# Patient Record
Sex: Male | Born: 1960 | Race: White | Hispanic: No | Smoking: Current every day smoker
Health system: Southern US, Community
[De-identification: ages and names within clinical notes are randomized; demographics above are authoritative.]

## PROBLEM LIST (undated history)

## (undated) DIAGNOSIS — G919 Hydrocephalus, unspecified: Secondary | ICD-10-CM

## (undated) DIAGNOSIS — F419 Anxiety disorder, unspecified: Secondary | ICD-10-CM

## (undated) DIAGNOSIS — J449 Chronic obstructive pulmonary disease, unspecified: Secondary | ICD-10-CM

## (undated) DIAGNOSIS — F32A Depression, unspecified: Secondary | ICD-10-CM

## (undated) DIAGNOSIS — F329 Major depressive disorder, single episode, unspecified: Secondary | ICD-10-CM

## (undated) HISTORY — DX: Anxiety disorder, unspecified: F41.9

## (undated) HISTORY — DX: Depression, unspecified: F32.A

## (undated) HISTORY — DX: Major depressive disorder, single episode, unspecified: F32.9

## (undated) HISTORY — PX: OTHER SURGICAL HISTORY: SHX169

---

## 2001-05-18 HISTORY — PX: BRAIN SURGERY: SHX531

## 2011-11-12 ENCOUNTER — Emergency Department: Payer: Self-pay | Admitting: *Deleted

## 2011-11-12 LAB — CBC WITH DIFFERENTIAL/PLATELET
Basophil #: 0.1 10*3/uL (ref 0.0–0.1)
Basophil %: 1 %
Eosinophil #: 0.1 10*3/uL (ref 0.0–0.7)
Eosinophil %: 2 %
HCT: 45.5 % (ref 40.0–52.0)
Lymphocyte #: 2.3 10*3/uL (ref 1.0–3.6)
Lymphocyte %: 32.2 %
MCH: 32.6 pg (ref 26.0–34.0)
MCHC: 34.2 g/dL (ref 32.0–36.0)
MCV: 95 fL (ref 80–100)
Monocyte #: 0.5 x10 3/mm (ref 0.2–1.0)
Monocyte %: 6.6 %
Neutrophil %: 58.2 %
Platelet: 286 10*3/uL (ref 150–440)
RDW: 12.6 % (ref 11.5–14.5)

## 2011-11-12 LAB — BASIC METABOLIC PANEL
Anion Gap: 6 — ABNORMAL LOW (ref 7–16)
Chloride: 106 mmol/L (ref 98–107)
Co2: 29 mmol/L (ref 21–32)
Creatinine: 0.89 mg/dL (ref 0.60–1.30)
Osmolality: 280 (ref 275–301)
Potassium: 4 mmol/L (ref 3.5–5.1)

## 2014-01-02 ENCOUNTER — Emergency Department: Payer: Self-pay | Admitting: Emergency Medicine

## 2014-03-26 DIAGNOSIS — J410 Simple chronic bronchitis: Secondary | ICD-10-CM | POA: Insufficient documentation

## 2014-03-30 ENCOUNTER — Ambulatory Visit: Payer: Self-pay | Admitting: Internal Medicine

## 2014-04-11 DIAGNOSIS — F331 Major depressive disorder, recurrent, moderate: Secondary | ICD-10-CM | POA: Insufficient documentation

## 2014-04-11 DIAGNOSIS — E46 Unspecified protein-calorie malnutrition: Secondary | ICD-10-CM | POA: Insufficient documentation

## 2014-04-16 ENCOUNTER — Ambulatory Visit: Payer: Self-pay | Admitting: Specialist

## 2014-04-16 LAB — BODY FLUID CELL COUNT WITH DIFFERENTIAL
Basophil: 0 %
EOS PCT: 0 %
Lymphocytes: 76 %
Neutrophils: 4 %
Nucleated Cell Count: 22 /mm3
OTHER CELLS BF: 0 %
Other Mononuclear Cells: 20 %

## 2014-04-16 LAB — GLUCOSE, SEROUS FLUID: Glucose, Body Fluid: 57 mg/dL

## 2014-04-16 LAB — PROTEIN, BODY FLUID: Protein, Body Fluid: 2 g/dL

## 2014-04-16 LAB — LACTATE DEHYDROGENASE, PLEURAL OR PERITONEAL FLUID: LDH, Body Fluid: 90 U/L

## 2014-04-20 LAB — BODY FLUID CULTURE

## 2014-08-31 ENCOUNTER — Other Ambulatory Visit: Payer: Self-pay | Admitting: Specialist

## 2014-08-31 DIAGNOSIS — R69 Illness, unspecified: Secondary | ICD-10-CM

## 2014-10-01 ENCOUNTER — Ambulatory Visit
Admission: RE | Admit: 2014-10-01 | Discharge: 2014-10-01 | Disposition: A | Discharge status: Home / Self Care | Payer: No Typology Code available for payment source | Source: Ambulatory Visit | Attending: Specialist | Admitting: Specialist

## 2014-11-08 ENCOUNTER — Other Ambulatory Visit: Payer: Self-pay | Admitting: Specialist

## 2014-11-08 DIAGNOSIS — J90 Pleural effusion, not elsewhere classified: Secondary | ICD-10-CM | POA: Insufficient documentation

## 2014-11-08 DIAGNOSIS — R911 Solitary pulmonary nodule: Secondary | ICD-10-CM

## 2014-11-15 ENCOUNTER — Ambulatory Visit
Admission: RE | Admit: 2014-11-15 | Discharge: 2014-11-15 | Disposition: A | Discharge status: Home / Self Care | Payer: No Typology Code available for payment source | Source: Ambulatory Visit | Attending: Specialist | Admitting: Specialist

## 2014-11-15 DIAGNOSIS — R918 Other nonspecific abnormal finding of lung field: Secondary | ICD-10-CM | POA: Diagnosis not present

## 2014-11-15 DIAGNOSIS — Z95828 Presence of other vascular implants and grafts: Secondary | ICD-10-CM | POA: Insufficient documentation

## 2014-11-15 DIAGNOSIS — J90 Pleural effusion, not elsewhere classified: Secondary | ICD-10-CM | POA: Diagnosis not present

## 2014-11-15 DIAGNOSIS — R590 Localized enlarged lymph nodes: Secondary | ICD-10-CM | POA: Insufficient documentation

## 2014-11-15 DIAGNOSIS — R911 Solitary pulmonary nodule: Secondary | ICD-10-CM | POA: Insufficient documentation

## 2014-11-28 ENCOUNTER — Other Ambulatory Visit: Payer: Self-pay | Admitting: Specialist

## 2014-11-28 DIAGNOSIS — R918 Other nonspecific abnormal finding of lung field: Secondary | ICD-10-CM

## 2015-05-17 ENCOUNTER — Ambulatory Visit
Admission: RE | Admit: 2015-05-17 | Discharge: 2015-05-17 | Disposition: A | Discharge status: Home / Self Care | Payer: No Typology Code available for payment source | Source: Ambulatory Visit | Attending: Specialist | Admitting: Specialist

## 2015-05-17 DIAGNOSIS — R918 Other nonspecific abnormal finding of lung field: Secondary | ICD-10-CM | POA: Insufficient documentation

## 2015-05-17 DIAGNOSIS — J432 Centrilobular emphysema: Secondary | ICD-10-CM | POA: Insufficient documentation

## 2015-05-17 DIAGNOSIS — J90 Pleural effusion, not elsewhere classified: Secondary | ICD-10-CM | POA: Diagnosis not present

## 2015-05-17 DIAGNOSIS — R590 Localized enlarged lymph nodes: Secondary | ICD-10-CM | POA: Diagnosis not present

## 2015-05-21 ENCOUNTER — Ambulatory Visit: Payer: No Typology Code available for payment source

## 2015-06-05 ENCOUNTER — Other Ambulatory Visit: Payer: Self-pay | Admitting: Specialist

## 2015-06-05 DIAGNOSIS — R918 Other nonspecific abnormal finding of lung field: Secondary | ICD-10-CM

## 2016-05-01 ENCOUNTER — Ambulatory Visit: Payer: Self-pay

## 2017-01-20 ENCOUNTER — Emergency Department: Payer: Self-pay

## 2017-01-20 ENCOUNTER — Inpatient Hospital Stay
Admission: EM | Admit: 2017-01-20 | Discharge: 2017-01-24 | DRG: 193 | Disposition: A | Discharge status: Home / Self Care | Payer: Self-pay | Attending: Internal Medicine | Admitting: Internal Medicine

## 2017-01-20 DIAGNOSIS — F17210 Nicotine dependence, cigarettes, uncomplicated: Secondary | ICD-10-CM | POA: Diagnosis present

## 2017-01-20 DIAGNOSIS — R64 Cachexia: Secondary | ICD-10-CM | POA: Diagnosis present

## 2017-01-20 DIAGNOSIS — J441 Chronic obstructive pulmonary disease with (acute) exacerbation: Secondary | ICD-10-CM | POA: Diagnosis present

## 2017-01-20 DIAGNOSIS — E46 Unspecified protein-calorie malnutrition: Secondary | ICD-10-CM | POA: Insufficient documentation

## 2017-01-20 DIAGNOSIS — J9611 Chronic respiratory failure with hypoxia: Secondary | ICD-10-CM | POA: Diagnosis present

## 2017-01-20 DIAGNOSIS — F411 Generalized anxiety disorder: Secondary | ICD-10-CM | POA: Diagnosis present

## 2017-01-20 DIAGNOSIS — G8929 Other chronic pain: Secondary | ICD-10-CM | POA: Diagnosis present

## 2017-01-20 DIAGNOSIS — J189 Pneumonia, unspecified organism: Principal | ICD-10-CM | POA: Diagnosis present

## 2017-01-20 DIAGNOSIS — J440 Chronic obstructive pulmonary disease with acute lower respiratory infection: Secondary | ICD-10-CM | POA: Diagnosis present

## 2017-01-20 DIAGNOSIS — J9601 Acute respiratory failure with hypoxia: Secondary | ICD-10-CM

## 2017-01-20 DIAGNOSIS — E43 Unspecified severe protein-calorie malnutrition: Secondary | ICD-10-CM | POA: Insufficient documentation

## 2017-01-20 DIAGNOSIS — Z885 Allergy status to narcotic agent status: Secondary | ICD-10-CM

## 2017-01-20 DIAGNOSIS — M549 Dorsalgia, unspecified: Secondary | ICD-10-CM | POA: Diagnosis present

## 2017-01-20 DIAGNOSIS — J9621 Acute and chronic respiratory failure with hypoxia: Secondary | ICD-10-CM | POA: Diagnosis present

## 2017-01-20 DIAGNOSIS — G2581 Restless legs syndrome: Secondary | ICD-10-CM | POA: Diagnosis present

## 2017-01-20 DIAGNOSIS — Z681 Body mass index (BMI) 19 or less, adult: Secondary | ICD-10-CM

## 2017-01-20 DIAGNOSIS — J8410 Pulmonary fibrosis, unspecified: Secondary | ICD-10-CM | POA: Diagnosis present

## 2017-01-20 HISTORY — DX: Hydrocephalus, unspecified: G91.9

## 2017-01-20 HISTORY — DX: Chronic obstructive pulmonary disease, unspecified: J44.9

## 2017-01-20 LAB — CBC
HEMATOCRIT: 39.5 % — AB (ref 40.0–52.0)
HEMOGLOBIN: 13.4 g/dL (ref 13.0–18.0)
MCH: 30.5 pg (ref 26.0–34.0)
MCHC: 33.8 g/dL (ref 32.0–36.0)
MCV: 90.2 fL (ref 80.0–100.0)
Platelets: 442 10*3/uL — ABNORMAL HIGH (ref 150–440)
RBC: 4.38 MIL/uL — AB (ref 4.40–5.90)
RDW: 12.5 % (ref 11.5–14.5)
WBC: 9.5 10*3/uL (ref 3.8–10.6)

## 2017-01-20 LAB — BASIC METABOLIC PANEL
ANION GAP: 7 (ref 5–15)
BUN: 17 mg/dL (ref 6–20)
CALCIUM: 9.2 mg/dL (ref 8.9–10.3)
CHLORIDE: 101 mmol/L (ref 101–111)
CO2: 29 mmol/L (ref 22–32)
Creatinine, Ser: 1.07 mg/dL (ref 0.61–1.24)
GFR calc non Af Amer: >60 mL/min (ref >60)
GLUCOSE: 122 mg/dL — AB (ref 65–99)
POTASSIUM: 4.3 mmol/L (ref 3.5–5.1)
Sodium: 137 mmol/L (ref 135–145)

## 2017-01-20 LAB — TROPONIN I: Troponin I: <0.03 ng/mL (ref <0.03)

## 2017-01-20 MED ORDER — DEXTROSE 5 % IV SOLN
1.0000 g | INTRAVENOUS | Status: DC
  Administered 2017-01-21 – 2017-01-23 (×3): 1 g via INTRAVENOUS
  Filled 2017-01-20 (×5): qty 10

## 2017-01-20 MED ORDER — IPRATROPIUM-ALBUTEROL 0.5-2.5 (3) MG/3ML IN SOLN
9.0000 mL | Freq: Once | RESPIRATORY_TRACT | Status: AC
  Administered 2017-01-20: 9 mL via RESPIRATORY_TRACT
  Filled 2017-01-20: qty 9

## 2017-01-20 MED ORDER — DEXTROSE 5 % IV SOLN: 500.0000 mg | INTRAVENOUS | Status: DC

## 2017-01-20 MED ORDER — DEXTROSE 5 % IV SOLN
500.0000 mg | Freq: Once | INTRAVENOUS | Status: AC
  Administered 2017-01-20: 500 mg via INTRAVENOUS
  Filled 2017-01-20: qty 500

## 2017-01-20 MED ORDER — CEFTRIAXONE SODIUM 1 G IJ SOLR
1.0000 g | Freq: Once | INTRAMUSCULAR | Status: AC
  Administered 2017-01-20: 1 g via INTRAVENOUS
  Filled 2017-01-20: qty 10

## 2017-01-20 MED ORDER — SODIUM CHLORIDE 0.9 % IV BOLUS (SEPSIS)
500.0000 mL | INTRAVENOUS | Status: AC
  Administered 2017-01-20: 500 mL via INTRAVENOUS

## 2017-01-20 MED ORDER — METHYLPREDNISOLONE SODIUM SUCC 125 MG IJ SOLR
125.0000 mg | Freq: Once | INTRAMUSCULAR | Status: AC
  Administered 2017-01-20: 125 mg via INTRAVENOUS
  Filled 2017-01-20: qty 2

## 2017-01-20 MED ORDER — IOPAMIDOL (ISOVUE-300) INJECTION 61%
75.0000 mL | Freq: Once | INTRAVENOUS | Status: AC | PRN
  Administered 2017-01-20: 75 mL via INTRAVENOUS

## 2017-01-20 NOTE — ED Notes (Signed)
Pt remain son 2L O2 by [place]

## 2017-01-20 NOTE — ED Notes (Signed)
Pt to CT at this time.

## 2017-01-20 NOTE — Progress Notes (Signed)
Pharmacy Antibiotic Note  Maxie BetterBarry K Millette is a 56 y.o. male admitted on 01/20/2017 with pneumonia.  Pharmacy has been consulted for azithromycin and ceftriaxone dosing.  Plan: Azithromycin 500 mg q 24 hours and ceftriaxone 1 gram q 24 hours ordered.  Height: 5\' 9"  (175.3 cm) Weight: 115 lb (52.2 kg) IBW/kg (Calculated) : 70.7  Temp (24hrs), Avg:97.6 F (36.4 C), Min:97.6 F (36.4 C), Max:97.6 F (36.4 C)   Recent Labs Lab 01/20/17 1846  WBC 9.5  CREATININE 1.07    Estimated Creatinine Clearance: 57.6 mL/min (by C-G formula based on SCr of 1.07 mg/dL).    Allergies  Allergen Reactions  . Oxycodone     Other reaction(s): Syncope    Antimicrobials this admission: Azithromycin ceftriaxone 9/5  >>    >>   Dose adjustments this admission:   Microbiology results: 9/5 BCx: pending      9/5 CXR:  Right mid lung focal opacity Thank you for allowing pharmacy to be a part of this patient's care.  Denman Pichardo S 01/20/2017 11:47 PM

## 2017-01-20 NOTE — ED Notes (Addendum)
Pt ambulated in hallway on room air. O2 dropped to 77% and heart rate elevated to 127bpm. MD made aware.,

## 2017-01-20 NOTE — ED Triage Notes (Signed)
Pt c/o increased SOB for the past 3 days with a productive cough.. Pt is has a hx of COPD and has had to have fluid drained from his lung in the past.

## 2017-01-20 NOTE — ED Provider Notes (Signed)
[facility] Emergency Department Provider Note  ____________________________________________   First MD Initiated Contact with Patient 01/20/17 2059     (approximate)  I have reviewed the triage vital signs and the nursing notes.   HISTORY  Chief Complaint Shortness of Breath    HPI Austin Blevins is a 56 y.o. male with a history of severe COPD who has been to see Dr. Meredeth IdeFleming in the past but not recently who presents by private vehicle for gradually worsening but now severe shortness of breath over the last 3 days.  He is having a productive cough and states that exertion makes his symptoms much worse.Nothing in particular makes the symptoms better.  It feels similar to prior COPD exacerbations.  He states that the main issue is that he cannot afford medications, so he is not taking anything except occasionally taking 1 puff on an albuterol inhaler that he "gets from somewhere else".  He is not on any maintenance medications, has no nebulizer, no oxygen, and has not been on steroids recently.  He denies fever/chills, chest pain, abdominal pain, nausea/vomiting, and dysuria.  He has some chronic back pain that is worse with his cough but that is unchanged from normal.  Past Medical History:  Diagnosis Date  . COPD (chronic obstructive pulmonary disease) (HCC)   . Hydrocephalus     There are no active problems to display for this patient.   History reviewed. No pertinent surgical history.  Prior to Admission medications   Not on File    Allergies Oxycodone  No family history on file.  Social History Social History  Substance Use Topics  . Smoking status: Current Every Day Smoker    Types: Cigarettes  . Smokeless tobacco: Never Used  . Alcohol use No    Review of Systems Constitutional: No fever/chills Eyes: No visual changes. ENT: No sore throat. Cardiovascular: Denies chest pain. Respiratory: Gradually worsening severe shortness of  breath and productive cough over the last 3 days Gastrointestinal: No abdominal pain.  No nausea, no vomiting.  No diarrhea.  No constipation. Genitourinary: Negative for dysuria. Musculoskeletal: Negative for neck pain.  Negative for back pain. Integumentary: Negative for rash. Neurological: Negative for headaches, focal weakness or numbness.   ____________________________________________   PHYSICAL EXAM:  VITAL SIGNS: ED Triage Vitals  Enc Vitals Group     BP 01/20/17 1841 112/75     Pulse Rate 01/20/17 1841 (!) 112     Resp --      Temp 01/20/17 1841 97.6 F (36.4 C)     Temp Source 01/20/17 1841 Oral     SpO2 01/20/17 1841 90 %     Weight 01/20/17 1847 52.2 kg (115 lb)     Height 01/20/17 1847 1.753 m (5\' 9" )     Head Circumference --      Peak Flow --      Pain Score 01/20/17 1846 3     Pain Loc --      Pain Edu? --      Excl. in GC? --     Constitutional: Alert and oriented. The patient is in moderate respiratory distress with a respiratory rate in the 50s, chronically ill-appearing and appears much older than chronological age Eyes: Conjunctivae are normal.  Head: Atraumatic. Nose: No congestion/rhinnorhea. Mouth/Throat: Mucous membranes are moist. Neck: No stridor.  No meningeal signs.   Cardiovascular: Tachycardia, regular rhythm. Good peripheral circulation. Grossly normal heart sounds. Respiratory: Increased respiratory effort.  Mild intercostal retractions.  Expiratory wheezing throughout.  Occasional cough Gastrointestinal: Cachectic.  Soft and nontender. No distention.  Musculoskeletal: No lower extremity tenderness nor edema. No gross deformities of extremities. Neurologic:  Normal speech and language. No gross focal neurologic deficits are appreciated.  Skin:  Skin is warm, dry and intact. No rash noted. Psychiatric: Mood and affect are normal. Speech and behavior are normal.  ____________________________________________   LABS (all labs ordered are  listed, but only abnormal results are displayed)  Labs Reviewed  BASIC METABOLIC PANEL - Abnormal; Notable for the following:       Result Value   Glucose, Bld 122 (*)    All other components within normal limits  CBC - Abnormal; Notable for the following:    RBC 4.38 (*)    HCT 39.5 (*)    Platelets 442 (*)    All other components within normal limits  CULTURE, BLOOD (ROUTINE X 2)  CULTURE, BLOOD (ROUTINE X 2)  TROPONIN I   ____________________________________________  EKG  ED ECG REPORT I, Amairany Schumpert, the attending physician, personally viewed and interpreted this ECG.  Date: 01/20/2017 EKG Time: 18:46 Rate: 105 Rhythm: borderline sinus tachycardia QRS Axis: normal Intervals: normal ST/T Wave abnormalities: Non-specific ST segment / T-wave changes, but no evidence of acute ischemia. Narrative Interpretation: no evidence of acute ischemia   ____________________________________________  RADIOLOGY   Dg Chest 2 View  Result Date: 01/20/2017 CLINICAL DATA:  Increased shortness of breath EXAM: CHEST  2 VIEW COMPARISON:  CT 05/17/2015 FINDINGS: Hyperinflation with emphysematous disease. Stable left chronic pleural effusion or thickening. Scarring within the left mid lung and left lung apex. Possible increase nodularity at the left apex. Development of 3.8 cm focal opacity in the right upper lobe. Stable cardiomediastinal silhouette with atherosclerosis. No pneumothorax. IMPRESSION: 1. Hyperinflation with emphysematous disease. Chronic left pleural changes and scarring. 2. Right mid lung focal opacity, may represent pneumonia or possible mass. Possible increased stellate density at the left apex, nodule cannot be excluded. CT may be helpful for further evaluation. Electronically Signed   By: Jasmine Pang M.D.   On: 01/20/2017 19:10   Ct Chest W Contrast  Result Date: 01/20/2017 CLINICAL DATA:  Increasing shortness of breath and productive cough for 3 days. History of COPD.  EXAM: CT CHEST WITH CONTRAST TECHNIQUE: Multidetector CT imaging of the chest was performed during intravenous contrast administration. CONTRAST:  75mL ISOVUE-300 IOPAMIDOL (ISOVUE-300) INJECTION 61% COMPARISON:  05/17/2015 FINDINGS: Cardiovascular: Normal heart size. No pericardial effusion. Mild coronary artery calcifications. Ascending thoracic aorta measures 3 cm maximal diameter. No evidence of aortic aneurysm or dissection. Great vessel origins are patent. Few scattered calcifications in the aorta. Mediastinum/Nodes: Prominent lymph nodes in the mediastinum and right hilum. Pretracheal lymph nodes measure up to about 10 mm in diameter. Right hilar lymph nodes measure up to about 13 mm in diameter. Lymph nodes are similar in appearance to previous study, possibly slightly enlarged since previous study. Small esophageal hiatal hernia. Esophagus is decompressed. Thyroid gland is unremarkable. Lungs/Pleura: Prominent emphysematous changes and scattered fibrosis throughout the lungs. Central bronchiectasis. Bronchial wall thickening. Since the previous study, there is interval development of airspace disease in the right lung, mostly involving the right middle lung. There are focal areas of consolidation in the right middle lung with patchy nodular infiltrates demonstrated throughout the right middle lung and also in the right lower lung. A few patchy nodular infiltrates are demonstrated in the left upper lung, new since previous study. Changes likely to represent a multifocal  infectious process. Consider multifocal bronchopneumonia, atypical pneumonia such as TB or fungal infiltrates. Septic emboli could potentially have this appearance. Lymphoma or neoplasm is not excluded. Three-month follow-up study after appropriate therapy is recommended. There is persistent consolidation or atelectasis in the left lung base which is unchanged since previous study suggesting a chronic process. Small left pleural effusion with  pleural thickening suggesting a loculated effusion. This is unchanged. No pneumothorax. Upper Abdomen: Calcifications in the head and body of the pancreas likely representing changes due to chronic pancreatitis. No acute inflammation is indicated. Musculoskeletal: Mild degenerative changes in the spine. No destructive bone lesions. IMPRESSION: 1. New development of patchy and confluent nodular infiltrates throughout the right lung and also in the left. Changes are likely to represent an acute infectious process. Consider multifocal bronchopneumonia, atypical pneumonia, septic emboli. Lymphoma or neoplasm not entirely excluded. Three-month follow-up study after appropriate therapy is recommended. 2. Diffuse emphysematous changes, bronchiectasis, and scattered fibrosis throughout the lungs as before. 3. Consolidation or atelectasis in the left lung base is unchanged since previous study suggesting chronic process. 4. Small left pleural effusion, probably loculated, unchanged since prior study. 5. Pancreatic calcifications suggesting chronic pancreatitis. No acute inflammatory changes. Aortic Atherosclerosis (ICD10-I70.0) and Emphysema (ICD10-J43.9). Electronically Signed   By: Burman Nieves M.D.   On: 01/20/2017 22:11    ____________________________________________   PROCEDURES  Critical Care performed: No   Procedure(s) performed:   Procedures   ____________________________________________   INITIAL IMPRESSION / ASSESSMENT AND PLAN / ED COURSE  Pertinent labs & imaging results that were available during my care of the patient were reviewed by me and considered in my medical decision making (see chart for details).  The patient is in Mild respiratory distress with wheezing with a resting tremor that causes his RR monitor to show higher than "true" readings.  He also has wheezing throughout his lung fields and is noncompliant with his medication regimen due to financial reasons.  He was 90%  on room air upon arrival but I suspect with ambulation he would drop into the 80s.  His chest x-ray suggests either a focal pneumonia in the right mid lobe possibly a mass.  I suspect he will require admission for treatment of his acute on chronic obstructive pulmonary disease exacerbation, but as per radiology recommendations I will obtain a CT chest with IV contrast to further evaluate his lungs and determine if he needs treatment for pneumonia versus oncology consult.   Clinical Course as of Jan 22 8  Thu Jan 21, 2017  0008 The patient desaturated to 77% on room air with minimal ambulation in spite of being treated with 3 DuoNeb labs and Solu-Medrol.  Additionally his CT scan shows multifocal pneumonia in both lobes of his lungs.  In spite of his lack of leukocytosis, I believe he needs IV antibiotics and obviously needs to be admitted for acute respiratory failure with hypoxemia.  He is comfortable on the oxygen and his wheezing has improved with nebulizer treatments.  I discussed the case with Dr. Sheryle Hail who will admit.  [CF]    Clinical Course User Index [CF] Loleta Rose, MD    ____________________________________________  FINAL CLINICAL IMPRESSION(S) / ED DIAGNOSES  Final diagnoses:  COPD exacerbation (HCC)  Acute respiratory failure with hypoxemia (HCC)  Community acquired pneumonia of right lung, unspecified part of lung  Community acquired pneumonia of left lung, unspecified part of lung     MEDICATIONS GIVEN DURING THIS VISIT:  Medications  azithromycin (ZITHROMAX) 500 mg  in dextrose 5 % 250 mL IVPB (500 mg Intravenous New Bag/Given 01/20/17 2319)  azithromycin (ZITHROMAX) 500 mg in dextrose 5 % 250 mL IVPB (not administered)  cefTRIAXone (ROCEPHIN) 1 g in dextrose 5 % 50 mL IVPB (not administered)  ipratropium-albuterol (DUONEB) 0.5-2.5 (3) MG/3ML nebulizer solution 9 mL (9 mLs Nebulization Given 01/20/17 2114)  methylPREDNISolone sodium succinate (SOLU-MEDROL) 125 mg/2 mL  injection 125 mg (125 mg Intravenous Given 01/20/17 2114)  sodium chloride 0.9 % bolus 500 mL (0 mLs Intravenous Stopped 01/20/17 2254)  iopamidol (ISOVUE-300) 61 % injection 75 mL (75 mLs Intravenous Contrast Given 01/20/17 2136)  cefTRIAXone (ROCEPHIN) 1 g in dextrose 5 % 50 mL IVPB (0 g Intravenous Stopped 01/20/17 2324)     NEW OUTPATIENT MEDICATIONS STARTED DURING THIS VISIT:  New Prescriptions   No medications on file    Modified Medications   No medications on file    Discontinued Medications   No medications on file     Note:  This document was prepared using Dragon voice recognition software and may include unintentional dictation errors.    Loleta Rose, MD 01/21/17 [phone]

## 2017-01-21 ENCOUNTER — Encounter: Payer: Self-pay | Admitting: Internal Medicine

## 2017-01-21 DIAGNOSIS — J9611 Chronic respiratory failure with hypoxia: Secondary | ICD-10-CM | POA: Diagnosis present

## 2017-01-21 DIAGNOSIS — J9621 Acute and chronic respiratory failure with hypoxia: Secondary | ICD-10-CM | POA: Diagnosis present

## 2017-01-21 LAB — TSH: TSH: 2.618 u[IU]/mL (ref 0.350–4.500)

## 2017-01-21 MED ORDER — ONDANSETRON HCL 4 MG/2ML IJ SOLN
4.0000 mg | Freq: Four times a day (QID) | INTRAMUSCULAR | Status: DC | PRN
  Administered 2017-01-21: 4 mg via INTRAVENOUS
  Filled 2017-01-21: qty 2

## 2017-01-21 MED ORDER — ACETAMINOPHEN 650 MG RE SUPP: 650.0000 mg | Freq: Four times a day (QID) | RECTAL | Status: DC | PRN

## 2017-01-21 MED ORDER — ONDANSETRON HCL 4 MG PO TABS: 4.0000 mg | ORAL_TABLET | Freq: Four times a day (QID) | ORAL | Status: DC | PRN

## 2017-01-21 MED ORDER — ENOXAPARIN SODIUM 40 MG/0.4ML ~~LOC~~ SOLN: 40.0000 mg | SUBCUTANEOUS | Status: DC

## 2017-01-21 MED ORDER — ENOXAPARIN SODIUM 30 MG/0.3ML ~~LOC~~ SOLN
30.0000 mg | SUBCUTANEOUS | Status: DC
  Administered 2017-01-22 – 2017-01-23 (×2): 30 mg via SUBCUTANEOUS
  Filled 2017-01-21 (×3): qty 0.3

## 2017-01-21 MED ORDER — AZITHROMYCIN 250 MG PO TABS
250.0000 mg | ORAL_TABLET | Freq: Every day | ORAL | Status: DC
  Administered 2017-01-21 – 2017-01-24 (×4): 250 mg via ORAL
  Filled 2017-01-21 (×4): qty 1

## 2017-01-21 MED ORDER — ENSURE ENLIVE PO LIQD
237.0000 mL | Freq: Three times a day (TID) | ORAL | Status: DC
  Administered 2017-01-21 – 2017-01-24 (×8): 237 mL via ORAL

## 2017-01-21 MED ORDER — DOCUSATE SODIUM 100 MG PO CAPS
100.0000 mg | ORAL_CAPSULE | Freq: Two times a day (BID) | ORAL | Status: DC
  Administered 2017-01-21 – 2017-01-24 (×6): 100 mg via ORAL
  Filled 2017-01-21 (×7): qty 1

## 2017-01-21 MED ORDER — ADULT MULTIVITAMIN W/MINERALS CH
1.0000 | ORAL_TABLET | Freq: Every day | ORAL | Status: DC
  Administered 2017-01-21 – 2017-01-24 (×4): 1 via ORAL
  Filled 2017-01-21 (×4): qty 1

## 2017-01-21 MED ORDER — PREDNISONE 10 MG PO TABS: 5.0000 mg | ORAL_TABLET | Freq: Every day | ORAL | Status: DC

## 2017-01-21 MED ORDER — ALBUTEROL SULFATE (2.5 MG/3ML) 0.083% IN NEBU
2.5000 mg | INHALATION_SOLUTION | RESPIRATORY_TRACT | Status: DC
Start: 2017-01-21 — End: 2017-01-22
  Administered 2017-01-21 – 2017-01-22 (×8): 2.5 mg via RESPIRATORY_TRACT
  Filled 2017-01-21 (×8): qty 3

## 2017-01-21 MED ORDER — CLONAZEPAM 0.5 MG PO TABS
1.0000 mg | ORAL_TABLET | ORAL | Status: AC
  Administered 2017-01-21: 1 mg via ORAL
  Filled 2017-01-21: qty 2

## 2017-01-21 MED ORDER — PANTOPRAZOLE SODIUM 40 MG PO TBEC
40.0000 mg | DELAYED_RELEASE_TABLET | Freq: Every day | ORAL | Status: DC
  Administered 2017-01-22 – 2017-01-24 (×3): 40 mg via ORAL
  Filled 2017-01-21 (×3): qty 1

## 2017-01-21 MED ORDER — ROPINIROLE HCL 1 MG PO TABS
0.5000 mg | ORAL_TABLET | Freq: Three times a day (TID) | ORAL | Status: DC
  Administered 2017-01-21 – 2017-01-24 (×10): 0.5 mg via ORAL
  Filled 2017-01-21 (×10): qty 1

## 2017-01-21 MED ORDER — PREDNISONE 50 MG PO TABS
50.0000 mg | ORAL_TABLET | Freq: Every day | ORAL | Status: AC
  Administered 2017-01-21: 50 mg via ORAL
  Filled 2017-01-21: qty 1

## 2017-01-21 MED ORDER — ACETAMINOPHEN 325 MG PO TABS: 650.0000 mg | ORAL_TABLET | Freq: Four times a day (QID) | ORAL | Status: DC | PRN

## 2017-01-21 MED ORDER — PREDNISONE 10 MG PO TABS: 10.0000 mg | ORAL_TABLET | Freq: Every day | ORAL | Status: DC

## 2017-01-21 MED ORDER — PREDNISONE 20 MG PO TABS
20.0000 mg | ORAL_TABLET | Freq: Every day | ORAL | Status: AC
  Administered 2017-01-24: 20 mg via ORAL
  Filled 2017-01-21: qty 1

## 2017-01-21 MED ORDER — PREDNISONE 20 MG PO TABS
40.0000 mg | ORAL_TABLET | Freq: Every day | ORAL | Status: AC
  Administered 2017-01-22: 40 mg via ORAL
  Filled 2017-01-21: qty 2

## 2017-01-21 MED ORDER — LORAZEPAM 2 MG/ML IJ SOLN
1.0000 mg | Freq: Once | INTRAMUSCULAR | Status: AC
  Administered 2017-01-21: 1 mg via INTRAVENOUS
  Filled 2017-01-21: qty 1

## 2017-01-21 MED ORDER — TIOTROPIUM BROMIDE MONOHYDRATE 18 MCG IN CAPS
18.0000 ug | ORAL_CAPSULE | Freq: Every day | RESPIRATORY_TRACT | Status: DC
  Administered 2017-01-21 – 2017-01-24 (×4): 18 ug via RESPIRATORY_TRACT
  Filled 2017-01-21: qty 5

## 2017-01-21 MED ORDER — LORAZEPAM 0.5 MG PO TABS
0.5000 mg | ORAL_TABLET | Freq: Four times a day (QID) | ORAL | Status: DC | PRN
  Administered 2017-01-21 – 2017-01-22 (×2): 0.5 mg via ORAL
  Filled 2017-01-21 (×3): qty 1

## 2017-01-21 MED ORDER — MOMETASONE FURO-FORMOTEROL FUM 200-5 MCG/ACT IN AERO
2.0000 | INHALATION_SPRAY | Freq: Two times a day (BID) | RESPIRATORY_TRACT | Status: DC
  Administered 2017-01-21 – 2017-01-24 (×7): 2 via RESPIRATORY_TRACT
  Filled 2017-01-21: qty 8.8

## 2017-01-21 MED ORDER — PREDNISONE 20 MG PO TABS
30.0000 mg | ORAL_TABLET | Freq: Every day | ORAL | Status: AC
  Administered 2017-01-23: 09:00:00 30 mg via ORAL
  Filled 2017-01-21: qty 1

## 2017-01-21 NOTE — Plan of Care (Signed)
Problem: Safety: Goal: Ability to remain free from injury will improve Outcome: Progressing Educated patient about calling before going to bathroom since o2 has been dropping while ambulating.

## 2017-01-21 NOTE — Progress Notes (Signed)
Admitted for COPD exacerbation, pneumonia. Patient still has shortness of breath and main complaint today is restless leg syndrome. Also has lots of cough. Hypoxic with room air saturation dropping to 70% with ambulation, now on 2L of oxygen, saturation 96% at rest.  Physical examination; alert, awake, oriented. Vitals reviewed Cardiovascular system S1, S2 regular Lungs diminished air entry bilaterally but no rales or wheezing. Neurological he is alert, awake, oriented, very anxious and having nonstop movement of legs.  Assessment and plan; 1. Acute respiratory failure secondary to COPD exacerbation, community-acquired pneumonia. Continue IV antibiotics, bronchodilators, IV steroids, oxygen. Patient needs ambulatory oxygen check plus oxygen at rest also to see if he qualifies for home oxygen. #2. Cachexia; due to pulmonary fibrosis. 6 minute walking challenge to see if he needs oxygen at home. #3 possible lung mass on CT chest. Mostly pneumonia but will need3-3 months follow-up CT chest . 4.anxiety: Use Ativan, Requip also for restless legs. Time spent 25 minutes. Discussed with nurse and also patient

## 2017-01-21 NOTE — Progress Notes (Signed)
Gave pt. One time dose of ativan and requip. Patient now asleep in room. Will continue to monitor.

## 2017-01-21 NOTE — Progress Notes (Signed)
Anticoagulation monitoring(Lovenox):  56yo  M ordered Lovenox 40 mg Q24h  Filed Weights   01/20/17 1847 01/21/17 0222  Weight: 115 lb (52.2 kg) 118 lb 14.4 oz (53.9 kg)   BMI 17.55   Lab Results  Component Value Date   CREATININE 1.07 01/20/2017   CREATININE 0.89 11/12/2011   Estimated Creatinine Clearance: 59.5 mL/min (by C-G formula based on SCr of 1.07 mg/dL). Hemoglobin & Hematocrit     Component Value Date/Time   HGB 13.4 01/20/2017 1846   HGB 15.6 11/12/2011 2107   HCT 39.5 (L) 01/20/2017 1846   HCT 45.5 11/12/2011 2107     Per Protocol for Patient with estCrcl > 30 ml/min andWeight < 57 kg in male patient, will transition to Lovenox 30 mg Q24h      Bari MantisKristin Kerri Kovacik PharmD Clinical Pharmacist 01/21/2017

## 2017-01-21 NOTE — Care Management Note (Signed)
Case Management Note  Patient Details  Name: Austin Blevins MRN: [id number] Date of Birth: March 02, 1961  Subjective/Objective:   Met with patient at bedside. [facility] given [facility]. E-mail sent to both agencies. Patient lives at home with his mother. He lost his job 2 weeks ago. Explained how he could apply for medicaid and disability. Patient will most likely qualify for home O2 and a nebulizer. Requested Corene Cornea with [employer] [facility] for indigent home O2 and nebulizer. He also will speak with patient further.                   Action/Plan:   Expected Discharge Date:  01/23/17               Expected Discharge Plan:     In-House Referral:     Discharge planning Services  CM Consult, [facility], Medication Assistance  Post Acute Care Choice:    Choice offered to:  Patient  DME Arranged:    DME Agency:     HH Arranged:    [place] Agency:     Status of Service:  In process, will continue to follow  If discussed at Long Length of Stay Meetings, dates discussed:    Additional Comments:  Jolly Mango, RN 01/21/2017, 4:10 PM

## 2017-01-21 NOTE — Progress Notes (Signed)
Found pt with oxygen off during rounding. Tubing was found under bed. Put back on pt. And re-educated about importance of wearing o2. Will continue to monitor

## 2017-01-21 NOTE — H&P (Signed)
Austin Blevins is an 56 y.o. male.   Chief Complaint: Shortness of breath HPI: The patient with past medical history of COPD presents emergency department complaining of shortness of breath. The patient states that he has had a cough and has barely been able to breathe with mild exercise blood alone at night for the last 4 days. He does not have inhalers but has been borrowing some from a friend. He is received only minimal relief. He was bringing up large amounts of clear thick phlegm yesterday. In the emergency department the patient desaturated to the high 70s without oxygen. He was promptly placed on supplemental oxygen via nasal cannula. Respiratory rate was very high but the patient is moving good air and was comfortable. Is given Solu-Medrol as well as antibiotics due to new opacity found on chest x-ray and CT chest. Once his respiratory status had improved emergency department staff called the hospitalist service for admission.  Past Medical History:  Diagnosis Date  . COPD (chronic obstructive pulmonary disease) ([place])   . Hydrocephalus     Past Surgical History:  Procedure Laterality Date  . ventricular shunt      Family History  Problem Relation Age of Onset  . Diabetes Mellitus II Mother   . CAD Father   . Hypertension Father    Social History:  reports that he has been smoking Cigarettes.  He has never used smokeless tobacco. He reports that he does not drink alcohol. His drug history is not on file.  Allergies:  Allergies  Allergen Reactions  . Oxycodone     Other reaction(s): Syncope    No prescriptions prior to admission.    Results for orders placed or performed during the hospital encounter of 01/20/17 (from the past 48 hour(s))  Basic metabolic panel     Status: Abnormal   Collection Time: 01/20/17  6:46 PM  Result Value Ref Range   Sodium 137 135 - 145 mmol/L   Potassium 4.3 3.5 - 5.1 mmol/L   Chloride 101 101 - 111 mmol/L   CO2 29 22 - 32 mmol/L   Glucose,  Bld 122 (H) 65 - 99 mg/dL   BUN 17 6 - 20 mg/dL   Creatinine, Ser 1.07 0.61 - 1.24 mg/dL   Calcium 9.2 8.9 - 10.3 mg/dL   GFR calc non Af Amer >60 >60 mL/min   GFR calc Af Amer >60 >60 mL/min    Comment: (NOTE) The eGFR has been calculated using the CKD EPI equation. This calculation has not been validated in all clinical situations. eGFR's persistently <60 mL/min signify possible Chronic Kidney Disease.    Anion gap 7 5 - 15  CBC     Status: Abnormal   Collection Time: 01/20/17  6:46 PM  Result Value Ref Range   WBC 9.5 3.8 - 10.6 K/uL   RBC 4.38 (L) 4.40 - 5.90 MIL/uL   Hemoglobin 13.4 13.0 - 18.0 g/dL   HCT 39.5 (L) 40.0 - 52.0 %   MCV 90.2 80.0 - 100.0 fL   MCH 30.5 26.0 - 34.0 pg   MCHC 33.8 32.0 - 36.0 g/dL   RDW 12.5 11.5 - 14.5 %   Platelets 442 (H) 150 - 440 K/uL  Troponin I     Status: None   Collection Time: 01/20/17  6:46 PM  Result Value Ref Range   Troponin I <0.03 <0.03 ng/mL  TSH     Status: None   Collection Time: 01/20/17  6:46 PM  Result  Value Ref Range   TSH 2.618 0.350 - 4.500 uIU/mL    Comment: Performed by a 3rd Generation assay with a functional sensitivity of <=0.01 uIU/mL.   Dg Chest 2 View  Result Date: 01/20/2017 CLINICAL DATA:  Increased shortness of breath EXAM: CHEST  2 VIEW COMPARISON:  CT 05/17/2015 FINDINGS: Hyperinflation with emphysematous disease. Stable left chronic pleural effusion or thickening. Scarring within the left mid lung and left lung apex. Possible increase nodularity at the left apex. Development of 3.8 cm focal opacity in the right upper lobe. Stable cardiomediastinal silhouette with atherosclerosis. No pneumothorax. IMPRESSION: 1. Hyperinflation with emphysematous disease. Chronic left pleural changes and scarring. 2. Right mid lung focal opacity, may represent pneumonia or possible mass. Possible increased stellate density at the left apex, nodule cannot be excluded. CT may be helpful for further evaluation. Electronically  Signed   By: Donavan Foil M.D.   On: 01/20/2017 19:10   Ct Chest W Contrast  Result Date: 01/20/2017 CLINICAL DATA:  Increasing shortness of breath and productive cough for 3 days. History of COPD. EXAM: CT CHEST WITH CONTRAST TECHNIQUE: Multidetector CT imaging of the chest was performed during intravenous contrast administration. CONTRAST:  68m ISOVUE-300 IOPAMIDOL (ISOVUE-300) INJECTION 61% COMPARISON:  05/17/2015 FINDINGS: Cardiovascular: Normal heart size. No pericardial effusion. Mild coronary artery calcifications. Ascending thoracic aorta measures 3 cm maximal diameter. No evidence of aortic aneurysm or dissection. Great vessel origins are patent. Few scattered calcifications in the aorta. Mediastinum/Nodes: Prominent lymph nodes in the mediastinum and right hilum. Pretracheal lymph nodes measure up to about 10 mm in diameter. Right hilar lymph nodes measure up to about 13 mm in diameter. Lymph nodes are similar in appearance to previous study, possibly slightly enlarged since previous study. Small esophageal hiatal hernia. Esophagus is decompressed. Thyroid gland is unremarkable. Lungs/Pleura: Prominent emphysematous changes and scattered fibrosis throughout the lungs. Central bronchiectasis. Bronchial wall thickening. Since the previous study, there is interval development of airspace disease in the right lung, mostly involving the right middle lung. There are focal areas of consolidation in the right middle lung with patchy nodular infiltrates demonstrated throughout the right middle lung and also in the right lower lung. A few patchy nodular infiltrates are demonstrated in the left upper lung, new since previous study. Changes likely to represent a multifocal infectious process. Consider multifocal bronchopneumonia, atypical pneumonia such as TB or fungal infiltrates. Septic emboli could potentially have this appearance. Lymphoma or neoplasm is not excluded. Three-month follow-up study after  appropriate therapy is recommended. There is persistent consolidation or atelectasis in the left lung base which is unchanged since previous study suggesting a chronic process. Small left pleural effusion with pleural thickening suggesting a loculated effusion. This is unchanged. No pneumothorax. Upper Abdomen: Calcifications in the head and body of the pancreas likely representing changes due to chronic pancreatitis. No acute inflammation is indicated. Musculoskeletal: Mild degenerative changes in the spine. No destructive bone lesions. IMPRESSION: 1. New development of patchy and confluent nodular infiltrates throughout the right lung and also in the left. Changes are likely to represent an acute infectious process. Consider multifocal bronchopneumonia, atypical pneumonia, septic emboli. Lymphoma or neoplasm not entirely excluded. Three-month follow-up study after appropriate therapy is recommended. 2. Diffuse emphysematous changes, bronchiectasis, and scattered fibrosis throughout the lungs as before. 3. Consolidation or atelectasis in the left lung base is unchanged since previous study suggesting chronic process. 4. Small left pleural effusion, probably loculated, unchanged since prior study. 5. Pancreatic calcifications suggesting chronic pancreatitis. No  acute inflammatory changes. Aortic Atherosclerosis (ICD10-I70.0) and Emphysema (ICD10-J43.9). Electronically Signed   By: Lucienne Capers M.D.   On: 01/20/2017 22:11    Review of Systems  Constitutional: Negative for chills and fever.  HENT: Negative for sore throat and tinnitus.   Eyes: Negative for blurred vision and redness.  Respiratory: Negative for cough and shortness of breath.   Cardiovascular: Negative for chest pain, palpitations, orthopnea and PND.  Gastrointestinal: Negative for abdominal pain, diarrhea, nausea and vomiting.  Genitourinary: Negative for dysuria, frequency and urgency.  Musculoskeletal: Negative for joint pain and  myalgias.  Skin: Negative for rash.       No lesions  Neurological: Negative for speech change, focal weakness and weakness.  Endo/Heme/Allergies: Does not bruise/bleed easily.       No temperature intolerance  Psychiatric/Behavioral: Negative for depression and suicidal ideas.    Blood pressure 113/63, pulse 96, temperature 97.6 F (36.4 C), temperature source Oral, resp. rate 20, height '5\' 9"'  (1.753 m), weight 53.9 kg (118 lb 14.4 oz), SpO2 100 %. Physical Exam   Assessment/Plan This is a 56 year old male admitted for acute on chronic respiratory failure. 1. Acute on chronic respiratory failure: With hypoxia; supplemental oxygen as needed. Solu-Medrol steroid taper. Due to new groundglass opacities and localized region of lung we will treat for community-acquired pneumonia at this time. Continue ceftriaxone and azithromycin. 2. COPD: An exacerbation. Start inhaled corticosteroid. Spiriva. Albuterol every 4 hours. 3. Cachexia: associated with pulmonary fibrosis; increased basal metabolic rate secondary to increased with breathing. 6 minute walk test while hospitalized to qualify for supplemental oxygen. 4. DVT prophylaxis: Lovenox 5. GI prophylaxis: None The patient is a full code. Time spent on admission orders and patient care approximately 45 minutes  Harrie Foreman, MD 01/21/2017, 3:29 AM

## 2017-01-21 NOTE — ED Notes (Signed)
Admitting MD at bedside.

## 2017-01-21 NOTE — ED Notes (Signed)
Pt given sandwich tray and water 

## 2017-01-21 NOTE — ED Notes (Signed)
Pt transport by Narda [place], EDT

## 2017-01-21 NOTE — ED Notes (Signed)
Pt given crackers, pb and sprite

## 2017-01-21 NOTE — Progress Notes (Signed)
Initial Nutrition Assessment  DOCUMENTATION CODES:   Severe malnutrition in context of chronic illness  INTERVENTION:   Ensure Enlive po TID, each supplement provides 350 kcal and 20 grams of protein  Magic cup TID with meals, each supplement provides 290 kcal and 9 grams of protein  MVI  Dysphagia 3 diet r/t poor dentition   NUTRITION DIAGNOSIS:   Malnutrition (severe) related to chronic illness (COPD, pulmonary fibrosis, poor dentition ) as evidenced by severe depletion of body fat, severe depletion of muscle mass.  GOAL:   Patient will meet greater than or equal to 90% of their needs  MONITOR:   PO intake, Supplement acceptance, Labs, Weight trends  REASON FOR ASSESSMENT:   Other (Comment) (Low BMI)    ASSESSMENT:   56 y/o male with h/o COPD admitted for COPD exacerbation and pneumonia  Met with pt in room today. Pt reports good appetite pta but states that his oral intake has been poor r/t poor dentition. Pt reports that his bottom front teeth are loose and are very sore; this prevents him from being able to bite down on foods and chew. Pt requesting mechanical soft diet. Pt reports 40lb(25%) wt loss over the past 9 months; this is severe. Pt is currently eating 50% of meals. Pt does drink Ensure on occasion but not regularly. RD discussed with pt the importance of adequate protein intake to preserve lean muscle mass giving his increased needs from COPD and pulmonary fibrosis. RD gave pt some tips on how to add extra protein and calories to his food and recommended protein supplements at home. RD will order Ensure and Magic Cups; continue to encourage intake of meals and supplements.   Medications reviewed and include: azithromycin, colace, lovenox, ceftriaxone  Labs reviewed  Nutrition-Focused physical exam completed. Findings are severe fat and muscle depletions over entire body, and no edema.   Diet Order:  DIET DYS 3 Room service appropriate? Yes; Fluid  consistency: Thin  Skin:  Reviewed, no issues  Last BM:  9/4  Height:   Ht Readings from Last 1 Encounters:  01/21/17 _0  (1.753 m)    Weight:   Wt Readings from Last 1 Encounters:  01/21/17 118 lb 14.4 oz (53.9 kg)    Ideal Body Weight:  72.7 kg  BMI:  Body mass index is 17.56 kg/m.  Estimated Nutritional Needs:   Kcal:  1800-2100kcal/day   Protein:  107-118g/day   Fluid:  >1.8L/day   EDUCATION NEEDS:   Education needs addressed  Koleen Distance MS, RD, LDN Pager #[phone] After Hours Pager: [phone]

## 2017-01-22 DIAGNOSIS — E43 Unspecified severe protein-calorie malnutrition: Secondary | ICD-10-CM | POA: Insufficient documentation

## 2017-01-22 DIAGNOSIS — E46 Unspecified protein-calorie malnutrition: Secondary | ICD-10-CM | POA: Insufficient documentation

## 2017-01-22 MED ORDER — TIOTROPIUM BROMIDE MONOHYDRATE 18 MCG IN CAPS: 18.0000 ug | ORAL_CAPSULE | Freq: Every day | RESPIRATORY_TRACT | 12 refills | Status: DC

## 2017-01-22 MED ORDER — AZITHROMYCIN 250 MG PO TABS: ORAL_TABLET | ORAL | 0 refills | Status: DC

## 2017-01-22 MED ORDER — PREDNISONE 10 MG PO TABS: 10.0000 mg | ORAL_TABLET | Freq: Every day | ORAL | 0 refills | Status: DC

## 2017-01-22 MED ORDER — IPRATROPIUM-ALBUTEROL 0.5-2.5 (3) MG/3ML IN SOLN: 3.0000 mL | RESPIRATORY_TRACT | Status: DC | PRN

## 2017-01-22 MED ORDER — ENSURE ENLIVE PO LIQD: 237.0000 mL | Freq: Three times a day (TID) | ORAL | 12 refills | Status: DC

## 2017-01-22 MED ORDER — FLUTICASONE-SALMETEROL 100-50 MCG/DOSE IN AEPB: 1.0000 | INHALATION_SPRAY | Freq: Two times a day (BID) | RESPIRATORY_TRACT | 0 refills | Status: DC

## 2017-01-22 MED ORDER — ALBUTEROL SULFATE (2.5 MG/3ML) 0.083% IN NEBU
2.5000 mg | INHALATION_SOLUTION | Freq: Four times a day (QID) | RESPIRATORY_TRACT | Status: DC
  Administered 2017-01-22 – 2017-01-23 (×6): 2.5 mg via RESPIRATORY_TRACT
  Filled 2017-01-22 (×6): qty 3

## 2017-01-22 MED ORDER — ALBUTEROL SULFATE (2.5 MG/3ML) 0.083% IN NEBU: 2.5000 mg | INHALATION_SOLUTION | RESPIRATORY_TRACT | 12 refills | Status: DC

## 2017-01-22 MED ORDER — ROPINIROLE HCL 0.5 MG PO TABS: 0.5000 mg | ORAL_TABLET | Freq: Three times a day (TID) | ORAL | 0 refills | Status: DC

## 2017-01-22 NOTE — Progress Notes (Signed)
[facility] [place] at [facility]   PATIENT NAME: Austin Blevins    MR#:  [id number]  DATE OF BIRTH:  1960-08-13  SUBJECTIVE: Patient is admitted for shortness of breath, COPD flare, pneumonia. Still has shortness of breath, cough. Very irate last night has restless leg syndrome, patient received Ativan last night, today morning.   CHIEF COMPLAINT:   Chief Complaint  Patient presents with  . Shortness of Breath    REVIEW OF SYSTEMS:   ROS CONSTITUTIONAL: No fever, fatigue or weakness.  EYES: No blurred or double vision.  EARS, NOSE, AND THROAT: No tinnitus or ear pain.  RESPIRATORY: cough, shortness of breath. CARDIOVASCULAR: No chest pain, orthopnea, edema.  GASTROINTESTINAL: No nausea, vomiting, diarrhea or abdominal pain.  GENITOURINARY: No dysuria, hematuria.  ENDOCRINE: No polyuria, nocturia,  HEMATOLOGY: No anemia, easy bruising or bleeding SKIN: No rash or lesion. MUSCULOSKELETAL: No joint pain or arthritis.   NEUROLOGIC: No tingling, numbness, weakness.  PSYCHIATRY: No anxiety or depression.   DRUG ALLERGIES:   Allergies  Allergen Reactions  . Oxycodone     Other reaction(s): Syncope    VITALS:  Blood pressure 100/62, pulse 92, temperature 98.2 F (36.8 C), temperature source Oral, resp. rate 19, height  (1.753 m), weight 56.4 kg (124 lb 6.4 oz), SpO2 95 %.  PHYSICAL EXAMINATION:  GENERAL:  56 y.o.-year-old patient lying in the bed with no acute distress. He appears cachectic. EYES: Pupils equal, round, reactive to light and accommodation. No scleral icterus. Extraocular muscles intact.  HEENT: Head atraumatic, normocephalic. Oropharynx and nasopharynx clear.  NECK:  Supple, no jugular venous distention. No thyroid enlargement, no tenderness.  LUNGS: Diminished breath sounds bilaterally. CARDIOVASCULAR: S1, S2 normal. No murmurs, rubs, or gallops.  ABDOMEN: Soft, nontender, nondistended. Bowel sounds present. No organomegaly or  mass.  EXTREMITIES: No pedal edema, cyanosis, or clubbing.  NEUROLOGIC: Cranial nerves II through XII are intact. Muscle strength 5/5 in all extremities. Sensation intact. Gait not checked.  PSYCHIATRIC: The patient is alert and oriented x 3.  SKIN: No obvious rash, lesion, or ulcer.    LABORATORY PANEL:   CBC  Recent Labs Lab 01/20/17 1846  WBC 9.5  HGB 13.4  HCT 39.5*  PLT 442*   ------------------------------------------------------------------------------------------------------------------  Chemistries   Recent Labs Lab 01/20/17 1846  NA 137  K 4.3  CL 101  CO2 29  GLUCOSE 122*  BUN 17  CREATININE 1.07  CALCIUM 9.2   ------------------------------------------------------------------------------------------------------------------  Cardiac Enzymes  Recent Labs Lab 01/20/17 1846  TROPONINI <0.03   ------------------------------------------------------------------------------------------------------------------  RADIOLOGY:  Dg Chest 2 View  Result Date: 01/20/2017 CLINICAL DATA:  Increased shortness of breath EXAM: CHEST  2 VIEW COMPARISON:  CT 05/17/2015 FINDINGS: Hyperinflation with emphysematous disease. Stable left chronic pleural effusion or thickening. Scarring within the left mid lung and left lung apex. Possible increase nodularity at the left apex. Development of 3.8 cm focal opacity in the right upper lobe. Stable cardiomediastinal silhouette with atherosclerosis. No pneumothorax. IMPRESSION: 1. Hyperinflation with emphysematous disease. Chronic left pleural changes and scarring. 2. Right mid lung focal opacity, may represent pneumonia or possible mass. Possible increased stellate density at the left apex, nodule cannot be excluded. CT may be helpful for further evaluation. Electronically Signed   By: Jasmine Pang M.D.   On: 01/20/2017 19:10   Ct Chest W Contrast  Result Date: 01/20/2017 CLINICAL DATA:  Increasing shortness of breath and productive  cough for 3 days. History of COPD. EXAM: CT CHEST  WITH CONTRAST TECHNIQUE: Multidetector CT imaging of the chest was performed during intravenous contrast administration. CONTRAST:  75mL ISOVUE-300 IOPAMIDOL (ISOVUE-300) INJECTION 61% COMPARISON:  05/17/2015 FINDINGS: Cardiovascular: Normal heart size. No pericardial effusion. Mild coronary artery calcifications. Ascending thoracic aorta measures 3 cm maximal diameter. No evidence of aortic aneurysm or dissection. Great vessel origins are patent. Few scattered calcifications in the aorta. Mediastinum/Nodes: Prominent lymph nodes in the mediastinum and right hilum. Pretracheal lymph nodes measure up to about 10 mm in diameter. Right hilar lymph nodes measure up to about 13 mm in diameter. Lymph nodes are similar in appearance to previous study, possibly slightly enlarged since previous study. Small esophageal hiatal hernia. Esophagus is decompressed. Thyroid gland is unremarkable. Lungs/Pleura: Prominent emphysematous changes and scattered fibrosis throughout the lungs. Central bronchiectasis. Bronchial wall thickening. Since the previous study, there is interval development of airspace disease in the right lung, mostly involving the right middle lung. There are focal areas of consolidation in the right middle lung with patchy nodular infiltrates demonstrated throughout the right middle lung and also in the right lower lung. A few patchy nodular infiltrates are demonstrated in the left upper lung, new since previous study. Changes likely to represent a multifocal infectious process. Consider multifocal bronchopneumonia, atypical pneumonia such as TB or fungal infiltrates. Septic emboli could potentially have this appearance. Lymphoma or neoplasm is not excluded. Three-month follow-up study after appropriate therapy is recommended. There is persistent consolidation or atelectasis in the left lung base which is unchanged since previous study suggesting a chronic  process. Small left pleural effusion with pleural thickening suggesting a loculated effusion. This is unchanged. No pneumothorax. Upper Abdomen: Calcifications in the head and body of the pancreas likely representing changes due to chronic pancreatitis. No acute inflammation is indicated. Musculoskeletal: Mild degenerative changes in the spine. No destructive bone lesions. IMPRESSION: 1. New development of patchy and confluent nodular infiltrates throughout the right lung and also in the left. Changes are likely to represent an acute infectious process. Consider multifocal bronchopneumonia, atypical pneumonia, septic emboli. Lymphoma or neoplasm not entirely excluded. Three-month follow-up study after appropriate therapy is recommended. 2. Diffuse emphysematous changes, bronchiectasis, and scattered fibrosis throughout the lungs as before. 3. Consolidation or atelectasis in the left lung base is unchanged since previous study suggesting chronic process. 4. Small left pleural effusion, probably loculated, unchanged since prior study. 5. Pancreatic calcifications suggesting chronic pancreatitis. No acute inflammatory changes. Aortic Atherosclerosis (ICD10-I70.0) and Emphysema (ICD10-J43.9). Electronically Signed   By: Burman Nieves M.D.   On: 01/20/2017 22:11    EKG:   Orders placed or performed during the hospital encounter of 01/20/17  . EKG 12-Lead  . EKG 12-Lead  . ED EKG  . ED EKG    ASSESSMENT AND PLAN:   #1 .acute respiratory failure secondary to multifocal pneumonia: Continue oxygen 2 L, check 6 minute walking challenge test. Continue IV antibiotics . Blood cultures are negative. WBC normal. No fever. On Rocephin, Zithromax. #2 severe COPD now has COPD exacerbation. Continue bronchodilators, tapering course of steroids, 6 minute walking challenge. CT chest showed a multifocal pneumonia but unable to exclude malignancy patient needs repeat CAT scan of the chest in 3 months with Dr. Meredeth Ide who  is his primary pulmonologist. Patient appears cachectic, with his history of smoking I'm concerned about malignancy. #3 severe malnutrition'continue ensure, Magic cups #4. Generalized anxiety, restless leg syndrome. Add Xanax, continue Requip but increase the dose of Requip. D/w RN and patient family.    All the  records are reviewed and case discussed with Care Management/Social Workerr. Management plans discussed with the patient, family and they are in agreement.  CODE STATUS:full  TOTAL TIME TAKING CARE OF THIS PATIENT:35 minutes.   POSSIBLE D/C IN 1-2 DAYS, DEPENDING ON CLINICAL CONDITION.   Katha HammingKONIDENA,Niana Martorana M.D on 01/22/2017 at 10:49 AM  Between 7am to 6pm - Pager - [phone]  After 6pm go to [email or web address] - password [email or web address] [email or web address]  [facility]  Office  [phone]  CC: Primary care physician; Lauro RegulusAnderson, Marshall W, MD   Note: This dictation was prepared with Dragon dictation along with smaller phrase technology. Any transcriptional errors that result from this process are unintentional.

## 2017-01-22 NOTE — Progress Notes (Addendum)
SATURATION QUALIFICATIONS: (This note is used to comply with regulatory documentation for home oxygen)  Patient Saturations on Room Air at Rest = 88%  Patient Saturations on Room Air while Ambulating = 84%  Patient Saturations on 2 Liters of oxygen while Ambulating = 87%  Patient Saturations on 4 Liters of oxygen while Ambulating = 89%  Patient Saturations on 2 Liters of oxygen while at rest = 95%  Please briefly explain why patient needs home oxygen: COPD

## 2017-01-22 NOTE — Care Management Note (Signed)
Case Management Note  Patient Details  Name: Austin Blevins MRN: [id number] Date of Birth: 11-21-1960  Subjective/Objective:  Patient has qualified for home O2. Notified Barbara CowerJason with  [facility] of need for home O[facility] and nebulizer. Requested nursing through charity program                  Action/Plan:   Expected Discharge Date:  01/23/17               Expected Discharge Plan:     In-House Referral:     Discharge planning Services  CM Consult, Indigent Health Clinic, Medication Assistance  Post Acute Care Choice:    Choice offered to:  Patient  DME Arranged:  Oxygen, Nebulizer/meds DME Agency:  [facility]  [facility]  Status of Service:  In process, will continue to follow  If discussed at Long Length of Stay Meetings, dates discussed:    Additional Comments:  Marily MemosLisa M Decklan Mau, RN 01/22/2017, 2:41 PM

## 2017-01-22 NOTE — Progress Notes (Signed)
Pharmacy Antibiotic Note  Austin Blevins is a 56 y.o. male admitted on 01/20/2017 with pneumonia.  Pharmacy has been consulted for azithromycin and ceftriaxone dosing.  Plan: Day 3-   Azithromycin 500 mg q 24 hours and ceftriaxone 1 gram q 24 hours ordered.  Height: 5\' 9"  (175.3 cm) Weight: 124 lb 6.4 oz (56.4 kg) IBW/kg (Calculated) : 70.7  Temp (24hrs), Avg:97.9 F (36.6 C), Min:97.6 F (36.4 C), Max:98.2 F (36.8 C)   Recent Labs Lab 01/20/17 1846  WBC 9.5  CREATININE 1.07    Estimated Creatinine Clearance: 62.2 mL/min (by C-G formula based on SCr of 1.07 mg/dL).    Allergies  Allergen Reactions  . Oxycodone     Other reaction(s): Syncope    Antimicrobials this admission: Azithromycin ceftriaxone 9/5  >>    >>   Dose adjustments this admission:   Microbiology results: 9/5 BCx: pending      9/5 CXR:  Right mid lung focal opacity Thank you for allowing pharmacy to be a part of this patient's care.  Itzayana Pardy A 01/22/2017 10:46 AM

## 2017-01-23 NOTE — Progress Notes (Signed)
Patient resting in bed. Mother of patient at bedside when MD rounded, mother and patient updated on care/plan. Possible d/c tomorrow depending on patients wellbeing. O2 at 2L, no complaints, continue to monitor.

## 2017-01-23 NOTE — Progress Notes (Signed)
[facility] - [place] at [facility]   PATIENT NAME: Austin Blevins    MR#:  [id number]  DATE OF BIRTH:  04-19-1961  SUBJECTIVE: Patient is admitted for shortness of breath, COPD flare, pneumonia. Still has shortness of breath, cough. Very irate last night has restless leg syndrome, patient received Ativan last night, today morning.   CHIEF COMPLAINT:   Chief Complaint  Patient presents with  . Shortness of Breath  Patient shortness of breath and cough are improving. Reporting generalized weakness. Patient is desaturating on room air and ambulatory pulse ox was 84% on room air and 89% on 4 L  REVIEW OF SYSTEMS:   ROS CONSTITUTIONAL: No fever, fatigue or weakness.  EYES: No blurred or double vision.  EARS, NOSE, AND THROAT: No tinnitus or ear pain.  RESPIRATORY: cough, shortness of breath.  CARDIOVASCULAR: No chest pain, orthopnea, edema.  GASTROINTESTINAL: No nausea, vomiting, diarrhea or abdominal pain.  GENITOURINARY: No dysuria, hematuria.  ENDOCRINE: No polyuria, nocturia,  HEMATOLOGY: No anemia, easy bruising or bleeding SKIN: No rash or lesion. MUSCULOSKELETAL: No joint pain or arthritis.   NEUROLOGIC: No tingling, numbness, weakness.  PSYCHIATRY: No anxiety or depression.   DRUG ALLERGIES:   Allergies  Allergen Reactions  . Oxycodone     Other reaction(s): Syncope    VITALS:  Blood pressure 105/63, pulse 95, temperature 97.7 F (36.5 C), temperature source Oral, resp. rate 18, height  (1.753 m), weight 58.5 kg (128 lb 14.4 oz), SpO2 95 %.  PHYSICAL EXAMINATION:  GENERAL:  56 y.o.-year-old patient lying in the bed with no acute distress. He appears cachectic. EYES: Pupils equal, round, reactive to light and accommodation. No scleral icterus. Extraocular muscles intact.  HEENT: Head atraumatic, normocephalic. Oropharynx and nasopharynx clear.  NECK:  Supple, no jugular venous distention. No thyroid enlargement, no tenderness.  LUNGS: Mod  breath sounds bilaterally. No wheezing , rales CARDIOVASCULAR: S1, S2 normal. No murmurs, rubs, or gallops.  ABDOMEN: Soft, nontender, nondistended. Bowel sounds present. No organomegaly or mass.  EXTREMITIES: No pedal edema, cyanosis, or clubbing.  NEUROLOGIC: Cranial nerves II through XII are intact. Muscle strength 5/5 in all extremities. Sensation intact. Gait not checked.  PSYCHIATRIC: The patient is alert and oriented x 3.  SKIN: No obvious rash, lesion, or ulcer.    LABORATORY PANEL:   CBC  Recent Labs Lab 01/20/17 1846  WBC 9.5  HGB 13.4  HCT 39.5*  PLT 442*   ------------------------------------------------------------------------------------------------------------------  Chemistries   Recent Labs Lab 01/20/17 1846  NA 137  K 4.3  CL 101  CO2 29  GLUCOSE 122*  BUN 17  CREATININE 1.07  CALCIUM 9.2   ------------------------------------------------------------------------------------------------------------------  Cardiac Enzymes  Recent Labs Lab 01/20/17 1846  TROPONINI <0.03   ------------------------------------------------------------------------------------------------------------------  RADIOLOGY:  No results found.  EKG:   Orders placed or performed during the hospital encounter of 01/20/17  . EKG 12-Lead  . EKG 12-Lead  . ED EKG  . ED EKG    ASSESSMENT AND PLAN:   #1 .acute respiratory failure secondary to multifocal pneumonia:  Continue oxygen 2 L  6 minute walking challenge test With ambulatory pulse ox 84% on room air and on 4 L 89%. Patient qualifies for oxygen . Continue IV antibiotics Rocephin and azithromycin Blood cultures are negative. WBC normal. No fever.   #2 severe COPD with COPD exacerbation.  Continue bronchodilators, tapering course of steroids  CT chest showed a multifocal pneumonia but unable to exclude malignancy patient needs repeat CAT  scan of the chest in 3 months with Dr. Meredeth IdeFleming who is his primary  pulmonologist. Patient appears cachectic, with his history of smoking I'm concerned about malignancy.  #3 severe malnutrition'continue ensure, Magic cups  #4. Generalized anxiety, restless leg syndrome. Add Xanax, continue Requip but increase the dose of Requip.  Patient was evaluated by physical therapy who has recommended outpatient PT for balance issues  D/w RN and patient family.    All the records are reviewed and case discussed with Care Management/Social Workerr. Management plans discussed with the patient, family and they are in agreement.  CODE STATUS:full  TOTAL TIME TAKING CARE OF THIS PATIENT:35 minutes.   POSSIBLE D/C IN 1-2 DAYS, DEPENDING ON CLINICAL CONDITION.   Ramonita LabGouru, Roseana Rhine M.D on 01/23/2017 at 2:56 PM  Between 7am to 6pm - Pager - [phone]  After 6pm go to [email or web address] - password [email or web address] [facility]  [facility]  Office  [phone]  CC: Primary care physician; Lauro RegulusAnderson, Marshall W, MD   Note: This dictation was prepared with Dragon dictation along with smaller phrase technology. Any transcriptional errors that result from this process are unintentional.

## 2017-01-23 NOTE — Evaluation (Signed)
Physical Therapy Evaluation Patient Details Name: Austin Blevins MRN: [id number] DOB: 1960-09-23 Today's Date: 01/23/2017   History of Present Illness  The patient with past medical history of COPD presents emergency department complaining of shortness of breath. The patient states that he has had a cough and has barely been able to breathe with mild exercise. He does not have inhalers but has been borrowing some from a friend. He has received only minimal relief. He was bringing up large amounts of clear thick phlegm yesterday. In the emergency department the patient desaturated to the high 70s without oxygen. He was promptly placed on supplemental oxygen via nasal cannula. Respiratory rate was very high but the patient is moving good air and was comfortable. Given Solu-Medrol as well as antibiotics due to new opacity found on chest x-ray and CT chest. Once his respiratory status had improved emergency department staff called the hospitalist service for admission. Pt is now admitted for acute respiratory failure secondary to multifocal PNA, COPD exacerbation, and malnutrition.   Clinical Impression  Pt admitted with above diagnosis. Pt currently with functional limitations due to the deficits listed below (see PT Problem List). PT is modified independent for bed mobility and supervision for transfers. He is able to ambulate a full lap around RN station with therapist. Decreased step length and decreased push-off during ambulation. Pt able to perform horizontal and vertical head turns with minimal change in speed and minimal lateral deviation but no overt LOB. Mild change in gait speed with cues. SaO2 at rest on room air is 94%. With exertion it drops to 88% on room air but recovers with standing rest break on room air to 90%. No supplemental O2 applied today. Pt reports minimal fatigue with ambulation and denies DOE but RR does appear to increase slightly. Pt would benefit from pulmonary rehab and reports he  is ready to stop smoking. He states that he has not smoked in the past 9 days. Pulmonary rehab may or may not be possible due to financial constraints. Pt also has higher level balance deficits that could benefit from OP PT but this is secondary to his breathing issues. Pt will benefit from PT services to address deficits in strength, balance, and mobility in order to return to full function at home.      Follow Up Recommendations Other (comment) (Pulmonary rehab, pt reports he is ready to stop smoking) States that he has not smoked in the past 9 days. Pt also has higher level balance deficits that could benefit from OP PT but this is secondary to his breathing issues.     Equipment Recommendations  None recommended by PT    Recommendations for Other Services       Precautions / Restrictions Precautions Precautions: Fall Restrictions Weight Bearing Restrictions: No      Mobility  Bed Mobility Overal bed mobility: Modified Independent             General bed mobility comments: HOB elevated and bed rails utilized. Slight increase in time but able to perform without external assist  Transfers Overall transfer level: Needs assistance Equipment used: None Transfers: Sit to/from Stand Sit to Stand: Supervision         General transfer comment: Pt able to transfer without external assist. Slight increase in time required but steady and stable in standing without UE support  Ambulation/Gait Ambulation/Gait assistance: Min guard Ambulation Distance (Feet): 200 Feet Assistive device: None Gait Pattern/deviations: Decreased step length - right;Decreased step length -  left Gait velocity: Decreased Gait velocity interpretation: <1.8 ft/sec, indicative of risk for recurrent falls General Gait Details: Pt is able to ambulate a full lap around RN station with therapist. Decreased step length and decreased push-off during ambulation. Pt able to perform horizontal and vertical head  turns with minimal change in speed and minimal lateral deviation but no overt LOB. Mild change in gait speed with cues. SaO2 at rest on room air is 94%. With extertion it drops to 88% on room air but recovers with standing rest break on room air to 90%. No supplemental O2 applied today with exertion. Pt reports minimal fatigue with ambulation and denies DOE but RR does appear to increase Therapist, occupationalslighlty  Stairs            Wheelchair Mobility    Modified Rankin (Stroke Patients Only)       Balance Overall balance assessment: Needs assistance Sitting-balance support: No upper extremity supported Sitting balance-Leahy Scale: Good     Standing balance support: No upper extremity supported Standing balance-Leahy Scale: Fair Standing balance comment: Able to maintain feet apart/together balance. Negative Rhomberg. Unable to perform tandem stance and single leg balance is 2-3 seconds bilaterally                             Pertinent Vitals/Pain Pain Assessment: No/denies pain    Home Living Family/patient expects to be discharged to:: Private residence Living Arrangements: Parent;Other (Comment) (Lives with mother) Available Help at Discharge: Family Type of Home: House Home Access: Stairs to enter Entrance Stairs-Rails: Right Entrance Stairs-Number of Steps: 3 Home Layout: One level Home Equipment: Walker - 2 wheels;Cane - single point;Grab bars - tub/shower (no BSC, wheelchair or shower seat)      Prior Function Level of Independence: Independent         Comments: Pt reports that he ambulates without assistance device and is independent with ADLs/IADLs. No falls     Hand Dominance   Dominant Hand: Right    Extremity/Trunk Assessment   Upper Extremity Assessment Upper Extremity Assessment: Overall WFL for tasks assessed    Lower Extremity Assessment Lower Extremity Assessment: Generalized weakness       Communication   Communication: No difficulties   Cognition Arousal/Alertness: Awake/alert Behavior During Therapy: WFL for tasks assessed/performed Overall Cognitive Status: Within Functional Limits for tasks assessed                                 General Comments: AOx4      General Comments      Exercises     Assessment/Plan    PT Assessment Patient needs continued PT services  PT Problem List Decreased strength;Decreased activity tolerance;Decreased balance;Decreased mobility;Cardiopulmonary status limiting activity       PT Treatment Interventions DME instruction;Gait training;Stair training;Functional mobility training;Therapeutic activities;Therapeutic exercise;Balance training;Neuromuscular re-education;Patient/family education    PT Goals (Current goals can be found in the Care Plan section)  Acute Rehab PT Goals Patient Stated Goal: Return to prior level of function at home PT Goal Formulation: With patient/family Time For Goal Achievement: 02/06/17 Potential to Achieve Goals: Fair    Frequency Min 2X/week   Barriers to discharge        Co-evaluation               AM-PAC PT "6 Clicks" Daily Activity  Outcome Measure Difficulty turning over in bed (including  adjusting bedclothes, sheets and blankets)?: None Difficulty moving from lying on back to sitting on the side of the bed? : None Difficulty sitting down on and standing up from a chair with arms (e.g., wheelchair, bedside commode, etc,.)?: None Help needed moving to and from a bed to chair (including a wheelchair)?: None Help needed walking in hospital room?: A Little Help needed climbing 3-5 steps with a railing? : A Little 6 Click Score: 22    End of Session Equipment Utilized During Treatment: Gait belt;Oxygen Activity Tolerance: Patient tolerated treatment well Patient left: in bed;with call bell/phone within reach;with bed alarm set;with family/visitor present   PT Visit Diagnosis: Unsteadiness on feet (R26.81);Muscle  weakness (generalized) (M62.81);Difficulty in walking, not elsewhere classified (R26.2)    Time: 1610-9604 PT Time Calculation (min) (ACUTE ONLY): 17 min   Charges:   PT Evaluation $PT Eval Low Complexity: 1 Low     PT G Codes:   PT G-Codes **NOT FOR INPATIENT CLASS** Functional Assessment Tool Used: AM-PAC 6 Clicks Basic Mobility Functional Limitation: Mobility: Walking and moving around Mobility: Walking and Moving Around Current Status (V4098): At least 20 percent but less than 40 percent impaired, limited or restricted Mobility: Walking and Moving Around Goal Status [phone]): At least 1 percent but less than 20 percent impaired, limited or restricted    Lynnea Maizes PT, DPT    Huprich,Jason 01/23/2017, 12:18 PM

## 2017-01-24 MED ORDER — ALBUTEROL SULFATE (2.5 MG/3ML) 0.083% IN NEBU: 2.5000 mg | INHALATION_SOLUTION | Freq: Four times a day (QID) | RESPIRATORY_TRACT | 12 refills | Status: DC | PRN

## 2017-01-24 MED ORDER — AMOXICILLIN-POT CLAVULANATE 875-125 MG PO TABS: 1.0000 | ORAL_TABLET | Freq: Two times a day (BID) | ORAL | 0 refills | Status: AC

## 2017-01-24 MED ORDER — PREDNISONE 10 MG (21) PO TBPK: 10.0000 mg | ORAL_TABLET | Freq: Every day | ORAL | 0 refills | Status: DC

## 2017-01-24 MED ORDER — IPRATROPIUM-ALBUTEROL 0.5-2.5 (3) MG/3ML IN SOLN: 3.0000 mL | Freq: Four times a day (QID) | RESPIRATORY_TRACT | 0 refills | Status: DC | PRN

## 2017-01-24 MED ORDER — ALBUTEROL SULFATE (2.5 MG/3ML) 0.083% IN NEBU
2.5000 mg | INHALATION_SOLUTION | Freq: Three times a day (TID) | RESPIRATORY_TRACT | Status: DC
  Administered 2017-01-24: 2.5 mg via RESPIRATORY_TRACT
  Filled 2017-01-24: qty 3

## 2017-01-24 MED ORDER — ROPINIROLE HCL 0.5 MG PO TABS: 0.5000 mg | ORAL_TABLET | Freq: Three times a day (TID) | ORAL | 0 refills | Status: DC

## 2017-01-24 NOTE — Discharge Instructions (Signed)
Follow-up with primary care physician in 5-7 days °Follow-up with pulmonology in a week ° °

## 2017-01-24 NOTE — Progress Notes (Signed)
Patient discharged home. Transportation via mother. Instructions and prescriptions given to patient. Verbalized understanding. O2 portable tank was delivered to patient in room.

## 2017-01-24 NOTE — Discharge Summary (Signed)
[facility] [place] at [facility]   PATIENT NAME: Austin Blevins    MR#:  [id number]  DATE OF BIRTH:  [birth year]  DATE OF ADMISSION:  01/20/2017 ADMITTING PHYSICIAN: Arnaldo Natal, MD  DATE OF DISCHARGE: 01/24/17 PRIMARY CARE PHYSICIAN: Lauro Regulus, MD    ADMISSION DIAGNOSIS:  COPD exacerbation (HCC) [J44.1] Acute respiratory failure with hypoxemia (HCC) [J96.01] Community acquired pneumonia of left lung, unspecified part of lung [J18.9] Community acquired pneumonia of right lung, unspecified part of lung [J18.9]  DISCHARGE DIAGNOSIS:  Active Problems:   Acute on chronic respiratory failure with hypoxemia (HCC)   Protein-calorie malnutrition, severe   SECONDARY DIAGNOSIS:   Past Medical History:  Diagnosis Date  . COPD (chronic obstructive pulmonary disease) (HCC)   . Hydrocephalus     HOSPITAL COURSE:   HPI: The patient with past medical history of COPD presents emergency department complaining of shortness of breath. The patient states that he has had a cough and has barely been able to breathe with mild exercise blood alone at night for the last 4 days. He does not have inhalers but has been borrowing some from a friend. He is received only minimal relief. He was bringing up large amounts of clear thick phlegm yesterday. In the emergency department the patient desaturated to the high 70s without oxygen. He was promptly placed on supplemental oxygen via nasal cannula. Respiratory rate was very high but the patient is moving good air and was comfortable. Is given Solu-Medrol as well as antibiotics due to new opacity found on chest x-ray and CT chest. Once his respiratory status had improved emergency department staff called the hospitalist service for admission.  #1 .acute respiratory failure secondary to multifocal pneumonia:  Continue oxygen 2 L  6 minute walking challenge test With ambulatory pulse ox 84% on room air and on 4 L 89%. Patient  qualifies for oxygen . Clinically improved with IV antibiotics Rocephin and azithromycin. Will discharge patient home with by mouth Augmentin Blood cultures are negative. WBC normal. No fever.   #2 severe COPD with COPD exacerbation.  Continue bronchodilators, tapering course of steroids  CT chest showed a multifocal pneumonia but unable to exclude malignancy patient needs repeat CAT scan of the chest in 3 months with Dr. Meredeth Ide who is his primary pulmonologist. Patient appears cachectic, with his history of smoking I'm concerned about malignancy.  #3 severe malnutrition'continue ensure, Magic cups  #4. Generalized anxiety, restless leg syndrome. Patient received as needed Xanax during the hospital course, continue Requip but increase the dose of Requip.  Patient was evaluated by physical therapy who has recommended outpatient PT for balance issues  D/w RN and patient family.   DISCHARGE CONDITIONS:   STABLE  CONSULTS OBTAINED:     PROCEDURES   DRUG ALLERGIES:   Allergies  Allergen Reactions  . Oxycodone     Other reaction(s): Syncope    DISCHARGE MEDICATIONS:   Current Discharge Medication List    START taking these medications   Details  albuterol (PROVENTIL) (2.5 MG/3ML) 0.083% nebulizer solution Take 3 mLs (2.5 mg total) by nebulization every 6 (six) hours as needed for wheezing or shortness of breath. Qty: 75 mL, Refills: 12    amoxicillin-clavulanate (AUGMENTIN) 875-125 MG tablet Take 1 tablet by mouth 2 (two) times daily. Qty: 10 tablet, Refills: 0    feeding supplement, ENSURE ENLIVE, (ENSURE ENLIVE) LIQD Take 237 mLs by mouth 3 (three) times daily between meals. Qty: 237 mL, Refills: 12  Fluticasone-Salmeterol (ADVAIR DISKUS) 100-50 MCG/DOSE AEPB Inhale 1 puff into the lungs 2 (two) times daily. Qty: 14 each, Refills: 0    ipratropium-albuterol (DUONEB) 0.5-2.5 (3) MG/3ML SOLN Take 3 mLs by nebulization every 6 (six) hours as needed. Qty: 360  mL, Refills: 0    predniSONE (STERAPRED UNI-PAK 21 TAB) 10 MG (21) TBPK tablet Take 1 tablet (10 mg total) by mouth daily. Take 6 tablets by mouth for 1 day followed by  5 tablets by mouth for 1 day followed by  4 tablets by mouth for 1 day followed by  3 tablets by mouth for 1 day followed by  2 tablets by mouth for 1 day followed by  1 tablet by mouth for a day and stop Qty: 21 tablet, Refills: 0    rOPINIRole (REQUIP) 0.5 MG tablet Take 1 tablet (0.5 mg total) by mouth 3 (three) times daily. Qty: 90 tablet, Refills: 0    tiotropium (SPIRIVA) 18 MCG inhalation capsule Place 1 capsule (18 mcg total) into inhaler and inhale daily. Qty: 30 capsule, Refills: 12         DISCHARGE INSTRUCTIONS:   Follow-up with primary care physician in 5-7 days Follow-up with pulmonology-in a week  DIET:  Regular diet  DISCHARGE CONDITION:  Fair  ACTIVITY:  Activity as tolerated  OXYGEN:  Home Oxygen: Yes.     Oxygen Delivery: 4 liters/min via Patient connected to nasal cannula oxygen  DISCHARGE LOCATION:  home   If you experience worsening of your admission symptoms, develop shortness of breath, life threatening emergency, suicidal or homicidal thoughts you must seek medical attention immediately by calling 911 or calling your MD immediately  if symptoms less severe.  You Must read complete instructions/literature along with all the possible adverse reactions/side effects for all the Medicines you take and that have been prescribed to you. Take any new Medicines after you have completely understood and accpet all the possible adverse reactions/side effects.   Please note  You were cared for by a hospitalist during your hospital stay. If you have any questions about your discharge medications or the care you received while you were in the hospital after you are discharged, you can call the unit and asked to speak with the hospitalist on call if the hospitalist that took care of you is not  available. Once you are discharged, your primary care physician will handle any further medical issues. Please note that NO REFILLS for any discharge medications will be authorized once you are discharged, as it is imperative that you return to your primary care physician (or establish a relationship with a primary care physician if you do not have one) for your aftercare needs so that they can reassess your need for medications and monitor your lab values.     Today  Chief Complaint  Patient presents with  . Shortness of Breath   Patient is feeling much better. Denies any shortness of breath or wheezing. Denies any chest pain. Wants to go home.  ROS:  CONSTITUTIONAL: Denies fevers, chills. Denies any fatigue, weakness.  EYES: Denies blurry vision, double vision, eye pain. EARS, NOSE, THROAT: Denies tinnitus, ear pain, hearing loss. RESPIRATORY: Denies cough, wheeze, shortness of breath.  CARDIOVASCULAR: Denies chest pain, palpitations, edema.  GASTROINTESTINAL: Denies nausea, vomiting, diarrhea, abdominal pain. Denies bright red blood per rectum. GENITOURINARY: Denies dysuria, hematuria. ENDOCRINE: Denies nocturia or thyroid problems. HEMATOLOGIC AND LYMPHATIC: Denies easy bruising or bleeding. SKIN: Denies rash or lesion. MUSCULOSKELETAL: Denies pain in neck,  back, shoulder, knees, hips or arthritic symptoms.  NEUROLOGIC: Denies paralysis, paresthesias.  PSYCHIATRIC: Denies anxiety or depressive symptoms.   VITAL SIGNS:  Blood pressure 108/70, pulse 82, temperature (!) 97.5 F (36.4 C), temperature source Oral, resp. rate 20, height 5\' 9"  (1.753 m), weight 58.5 kg (128 lb 14.4 oz), SpO2 99 %.  I/O:    Intake/Output Summary (Last 24 hours) at 01/24/17 1211 Last data filed at 01/24/17 0800  Gross per 24 hour  Intake              530 ml  Output                0 ml  Net              530 ml    PHYSICAL EXAMINATION:  GENERAL:  56 y.o.-year-old patient lying in the bed with no  acute distress. Cachectic EYES: Pupils equal, round, reactive to light and accommodation. No scleral icterus. Extraocular muscles intact.  HEENT: Head atraumatic, normocephalic. Oropharynx and nasopharynx clear.  NECK:  Supple, no jugular venous distention. No thyroid enlargement, no tenderness.  LUNGS: Normal breath sounds bilaterally, no wheezing, rales,rhonchi or crepitation. No use of accessory muscles of respiration.  CARDIOVASCULAR: S1, S2 normal. No murmurs, rubs, or gallops.  ABDOMEN: Soft, non-tender, non-distended. Bowel sounds present. No organomegaly or mass.  EXTREMITIES: No pedal edema, cyanosis, or clubbing.  NEUROLOGIC: Cranial nerves II through XII are intact. Muscle strength 5/5 in all extremities. Sensation intact. Gait not checked.  PSYCHIATRIC: The patient is alert and oriented x 3.  SKIN: No obvious rash, lesion, or ulcer.   DATA REVIEW:   CBC  Recent Labs Lab 01/20/17 1846  WBC 9.5  HGB 13.4  HCT 39.5*  PLT 442*    Chemistries   Recent Labs Lab 01/20/17 1846  NA 137  K 4.3  CL 101  CO2 29  GLUCOSE 122*  BUN 17  CREATININE 1.07  CALCIUM 9.2    Cardiac Enzymes  Recent Labs Lab 01/20/17 1846  TROPONINI <0.03    Microbiology Results  Results for orders placed or performed during the hospital encounter of 01/20/17  Blood culture (routine x 2)     Status: None (Preliminary result)   Collection Time: 01/20/17 10:48 PM  Result Value Ref Range Status   Specimen Description BLOOD BLOOD LEFT FOREARM  Final   Special Requests   Final    BOTTLES DRAWN AEROBIC AND ANAEROBIC Blood Culture adequate volume   Culture NO GROWTH 4 DAYS  Final   Report Status PENDING  Incomplete  Blood culture (routine x 2)     Status: None (Preliminary result)   Collection Time: 01/20/17 10:49 PM  Result Value Ref Range Status   Specimen Description BLOOD RIGHT ANTECUBITAL  Final   Special Requests   Final    BOTTLES DRAWN AEROBIC AND ANAEROBIC Blood Culture adequate  volume   Culture NO GROWTH 4 DAYS  Final   Report Status PENDING  Incomplete    RADIOLOGY:  Dg Chest 2 View  Result Date: 01/20/2017 CLINICAL DATA:  Increased shortness of breath EXAM: CHEST  2 VIEW COMPARISON:  CT 05/17/2015 FINDINGS: Hyperinflation with emphysematous disease. Stable left chronic pleural effusion or thickening. Scarring within the left mid lung and left lung apex. Possible increase nodularity at the left apex. Development of 3.8 cm focal opacity in the right upper lobe. Stable cardiomediastinal silhouette with atherosclerosis. No pneumothorax. IMPRESSION: 1. Hyperinflation with emphysematous disease. Chronic left pleural changes and  scarring. 2. Right mid lung focal opacity, may represent pneumonia or possible mass. Possible increased stellate density at the left apex, nodule cannot be excluded. CT may be helpful for further evaluation. Electronically Signed   By: Jasmine Pang M.D.   On: 01/20/2017 19:10   Ct Chest W Contrast  Result Date: 01/20/2017 CLINICAL DATA:  Increasing shortness of breath and productive cough for 3 days. History of COPD. EXAM: CT CHEST WITH CONTRAST TECHNIQUE: Multidetector CT imaging of the chest was performed during intravenous contrast administration. CONTRAST:  75mL ISOVUE-300 IOPAMIDOL (ISOVUE-300) INJECTION 61% COMPARISON:  05/17/2015 FINDINGS: Cardiovascular: Normal heart size. No pericardial effusion. Mild coronary artery calcifications. Ascending thoracic aorta measures 3 cm maximal diameter. No evidence of aortic aneurysm or dissection. Great vessel origins are patent. Few scattered calcifications in the aorta. Mediastinum/Nodes: Prominent lymph nodes in the mediastinum and right hilum. Pretracheal lymph nodes measure up to about 10 mm in diameter. Right hilar lymph nodes measure up to about 13 mm in diameter. Lymph nodes are similar in appearance to previous study, possibly slightly enlarged since previous study. Small esophageal hiatal hernia.  Esophagus is decompressed. Thyroid gland is unremarkable. Lungs/Pleura: Prominent emphysematous changes and scattered fibrosis throughout the lungs. Central bronchiectasis. Bronchial wall thickening. Since the previous study, there is interval development of airspace disease in the right lung, mostly involving the right middle lung. There are focal areas of consolidation in the right middle lung with patchy nodular infiltrates demonstrated throughout the right middle lung and also in the right lower lung. A few patchy nodular infiltrates are demonstrated in the left upper lung, new since previous study. Changes likely to represent a multifocal infectious process. Consider multifocal bronchopneumonia, atypical pneumonia such as TB or fungal infiltrates. Septic emboli could potentially have this appearance. Lymphoma or neoplasm is not excluded. Three-month follow-up study after appropriate therapy is recommended. There is persistent consolidation or atelectasis in the left lung base which is unchanged since previous study suggesting a chronic process. Small left pleural effusion with pleural thickening suggesting a loculated effusion. This is unchanged. No pneumothorax. Upper Abdomen: Calcifications in the head and body of the pancreas likely representing changes due to chronic pancreatitis. No acute inflammation is indicated. Musculoskeletal: Mild degenerative changes in the spine. No destructive bone lesions. IMPRESSION: 1. New development of patchy and confluent nodular infiltrates throughout the right lung and also in the left. Changes are likely to represent an acute infectious process. Consider multifocal bronchopneumonia, atypical pneumonia, septic emboli. Lymphoma or neoplasm not entirely excluded. Three-month follow-up study after appropriate therapy is recommended. 2. Diffuse emphysematous changes, bronchiectasis, and scattered fibrosis throughout the lungs as before. 3. Consolidation or atelectasis in the  left lung base is unchanged since previous study suggesting chronic process. 4. Small left pleural effusion, probably loculated, unchanged since prior study. 5. Pancreatic calcifications suggesting chronic pancreatitis. No acute inflammatory changes. Aortic Atherosclerosis (ICD10-I70.0) and Emphysema (ICD10-J43.9). Electronically Signed   By: Burman Nieves M.D.   On: 01/20/2017 22:11    EKG:   Orders placed or performed during the hospital encounter of 01/20/17  . EKG 12-Lead  . EKG 12-Lead  . ED EKG  . ED EKG      Management plans discussed with the patient, family and they are in agreement.  CODE STATUS:     Code Status Orders        Start     Ordered   01/21/17 0223  Full code  Continuous     01/21/17 0222  Code Status History    Date Active Date Inactive Code Status Order ID Comments User Context   This patient has a current code status but no historical code status.      TOTAL TIME TAKING CARE OF THIS PATIENT:  45  minutes.   Note: This dictation was prepared with Dragon dictation along with smaller phrase technology. Any transcriptional errors that result from this process are unintentional.   @  on 01/24/2017 at 12:11 PM  Between 7am to 6pm - Pager - [phone]  After 6pm go to [email or web address] - password [email or web address] [facility]  [facility]  Office  [phone]  CC: Primary care physician; Lauro Regulus, MD

## 2017-01-25 LAB — CULTURE, BLOOD (ROUTINE X 2)
CULTURE: NO GROWTH
CULTURE: NO GROWTH
SPECIAL REQUESTS: ADEQUATE
Special Requests: ADEQUATE

## 2017-01-28 ENCOUNTER — Telehealth: Payer: Self-pay

## 2017-01-28 NOTE — Telephone Encounter (Signed)
Called to go over eligibility. Pt verbalized understanding.

## 2017-02-03 ENCOUNTER — Ambulatory Visit: Payer: Self-pay | Admitting: Pharmacy Technician

## 2017-02-03 DIAGNOSIS — Z79899 Other long term (current) drug therapy: Secondary | ICD-10-CM

## 2017-02-08 NOTE — Progress Notes (Signed)
Met with patient completed financial assistance application for Austin Blevins due to recent hospital visit.  Patient agreed to be responsible for gathering financial information and forwarding to appropriate department in [facility].    Completed [facility] application and contract.  Patient agreed to all terms of the [facility] contract.  Patient approved to receive medication assistance at [facility] through 2018, as long as eligibility criteria continues to be met.  Provided patient with Civil engineer, contracting based on his particular needs.    Patient referred to [facility].  [facility]

## 2017-02-11 ENCOUNTER — Ambulatory Visit: Payer: Self-pay | Admitting: Adult Health Nurse Practitioner

## 2017-02-11 DIAGNOSIS — J449 Chronic obstructive pulmonary disease, unspecified: Secondary | ICD-10-CM

## 2017-02-11 DIAGNOSIS — Z0000 Encounter for general adult medical examination without abnormal findings: Secondary | ICD-10-CM

## 2017-02-11 MED ORDER — ROPINIROLE HCL 0.5 MG PO TABS: 0.5000 mg | ORAL_TABLET | Freq: Three times a day (TID) | ORAL | 2 refills | Status: DC

## 2017-02-11 MED ORDER — ALBUTEROL SULFATE HFA 108 (90 BASE) MCG/ACT IN AERS: 1.0000 | INHALATION_SPRAY | Freq: Four times a day (QID) | RESPIRATORY_TRACT | 6 refills | Status: DC | PRN

## 2017-02-11 MED ORDER — FLUTICASONE-SALMETEROL 100-50 MCG/DOSE IN AEPB: 1.0000 | INHALATION_SPRAY | Freq: Two times a day (BID) | RESPIRATORY_TRACT | 6 refills | Status: DC

## 2017-02-11 NOTE — Progress Notes (Signed)
Patient: Austin Blevins Male    DOB: January 08, 1961   56 y.o.   MRN: [id number] Visit Date: 02/11/2017  Today's Provider: Jacelyn Pi, NP   Chief Complaint  Patient presents with  . Breathing Problem   Subjective:    HPI  Pt states that his O2 sats drop in the 80s on exertion.  Pt states that he has SOB at rest.  Diagnosed with COPD x 4m-year.  Pt states that he smokes maybe 5 cigs/day.  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  Pt has a Proair inhaler that he is using 6-7 x in a week, states that sometimes he does not use it at all.   Productive cough with clear/white sputum.  Recently treated for COPD exacerbation and pneumonia.      Allergies  Allergen Reactions  . Oxycodone     Other reaction(s): Syncope   Previous Medications   ALBUTEROL (PROVENTIL) (2.5 MG/3ML) 0.083% NEBULIZER SOLUTION    Take 3 mLs (2.5 mg total) by nebulization every 6 (six) hours as needed for wheezing or shortness of breath.   FEEDING SUPPLEMENT, ENSURE ENLIVE, (ENSURE ENLIVE) LIQD    Take 237 mLs by mouth 3 (three) times daily between meals.   FLUTICASONE-SALMETEROL (ADVAIR DISKUS) 100-50 MCG/DOSE AEPB    Inhale 1 puff into the lungs 2 (two) times daily.   IPRATROPIUM-ALBUTEROL (DUONEB) 0.5-2.5 (3) MG/3ML SOLN    Take 3 mLs by nebulization every 6 (six) hours as needed.   PREDNISONE (STERAPRED UNI-PAK 21 TAB) 10 MG (21) TBPK TABLET    Take 1 tablet (10 mg total) by mouth daily. Take 6 tablets by mouth for 1 day followed by  5 tablets by mouth for 1 day followed by  4 tablets by mouth for 1 day followed by  3 tablets by mouth for 1 day followed by  2 tablets by mouth for 1 day followed by  1 tablet by mouth for a day and stop   ROPINIROLE (REQUIP) 0.5 MG TABLET    Take 1 tablet (0.5 mg total) by mouth 3 (three) times daily.   TIOTROPIUM (SPIRIVA) 18 MCG INHALATION CAPSULE    Place 1 capsule (18 mcg total) into  inhaler and inhale daily.    Review of Systems  All other systems reviewed and are negative.   Social History  Substance Use Topics  . Smoking status: Current Every Day Smoker    Types: Cigarettes  . Smokeless tobacco: Never Used  . Alcohol use No   Objective:   BP 125/84 (BP Location: Left Arm)   Pulse 82   Temp 97.8 F (36.6 C)   Wt 123 lb 14.4 oz (56.2 kg)   BMI 18.30 kg/m   Physical Exam  Constitutional: He is oriented to person, place, and time.  Thin and frail appearing.   Neck: Normal range of motion. Neck supple.  Cardiovascular: Normal rate, regular rhythm, normal heart sounds and intact distal pulses.   Pulmonary/Chest: Effort normal.  Diminished throughout  Abdominal: Soft. Bowel sounds are normal.  Neurological: He is alert and oriented to person, place, and time.  Skin: Skin is warm and dry.  Vitals reviewed.       Assessment & Plan:          COPD:  RX for Advair and rescue inhaler.  Discussed use.   [facility] care for pulmonology referral.  Repeat CT scan in  3 months to r/o malignancy.   Continue Requip for RLS.   Referral to dental clinic.  Routine labs today.     Jacelyn Pi, NP   [facility]

## 2017-02-12 LAB — COMPREHENSIVE METABOLIC PANEL
ALK PHOS: 112 IU/L (ref 39–117)
ALT: 12 IU/L (ref 0–44)
AST: 15 IU/L (ref 0–40)
Albumin/Globulin Ratio: 1.4 (ref 1.2–2.2)
Albumin: 3.7 g/dL (ref 3.5–5.5)
BUN/Creatinine Ratio: 18 (ref 9–20)
BUN: 14 mg/dL (ref 6–24)
Bilirubin Total: 0.2 mg/dL (ref 0.0–1.2)
CO2: 26 mmol/L (ref 20–29)
CREATININE: 0.79 mg/dL (ref 0.76–1.27)
Calcium: 9.2 mg/dL (ref 8.7–10.2)
Chloride: 103 mmol/L (ref 96–106)
GFR calc Af Amer: 117 mL/min/{1.73_m2} (ref >59)
GFR calc non Af Amer: 101 mL/min/{1.73_m2} (ref >59)
GLOBULIN, TOTAL: 2.7 g/dL (ref 1.5–4.5)
Glucose: 87 mg/dL (ref 65–99)
POTASSIUM: 4.9 mmol/L (ref 3.5–5.2)
SODIUM: 141 mmol/L (ref 134–144)
Total Protein: 6.4 g/dL (ref 6.0–8.5)

## 2017-02-12 LAB — PSA: PROSTATE SPECIFIC AG, SERUM: 2.6 ng/mL (ref 0.0–4.0)

## 2017-02-12 LAB — CBC
HEMATOCRIT: 40.2 % (ref 37.5–51.0)
Hemoglobin: 13.4 g/dL (ref 13.0–17.7)
MCH: 29.9 pg (ref 26.6–33.0)
MCHC: 33.3 g/dL (ref 31.5–35.7)
MCV: 90 fL (ref 79–97)
PLATELETS: 282 10*3/uL (ref 150–379)
RBC: 4.48 x10E6/uL (ref 4.14–5.80)
RDW: 13.7 % (ref 12.3–15.4)
WBC: 6.9 10*3/uL (ref 3.4–10.8)

## 2017-02-12 LAB — LIPID PANEL
CHOLESTEROL TOTAL: 165 mg/dL (ref 100–199)
Chol/HDL Ratio: 4.9 ratio (ref 0.0–5.0)
HDL: 34 mg/dL — ABNORMAL LOW (ref >39)
LDL Calculated: 109 mg/dL — ABNORMAL HIGH (ref 0–99)
Triglycerides: 112 mg/dL (ref 0–149)
VLDL CHOLESTEROL CAL: 22 mg/dL (ref 5–40)

## 2017-02-12 LAB — TSH: TSH: 1.81 u[IU]/mL (ref 0.450–4.500)

## 2017-02-12 LAB — HEMOGLOBIN A1C
ESTIMATED AVERAGE GLUCOSE: 137 mg/dL
Hgb A1c MFr Bld: 6.4 % — ABNORMAL HIGH (ref 4.8–5.6)

## 2017-02-16 NOTE — Progress Notes (Signed)
[facility] [place] Pulmonary Medicine Consultation      Assessment and Plan:  56 year old male with severe, oxygen dependent emphysema, dyspnea on exertion, current smoking.  Severe emphysema.  -Showed the patient, his most recent CAT scan which showed severe bullous emphysema as well as other respiratory issues discussed the importance of quitting smoking, as well as starting on inhalers. -Continue current pro-air inhaler. The patient does not have health insurance, he is currently in the process of obtaining [facility]. I asked him to let us know if he needs any assistance with this process. I encouraged him to apply for Medicaid.  Left lung empyema-chronic. -This has been stable for approximately 2 years, and does not require intervention at this time. There is mild left mediastinal shift, likely caused by the presence of this empyema which is limiting left lung movement, and likely contributing to dyspnea. -We discussed that this is not amenable to drainage, the only definitive therapy would be thoracotomy which would not be advised, given his severe emphysema and chronic respiratory failure.   Fibrosis of the right lung.  -There are progressive fibrotic changes seen in the right lung seen on previous scans. We discussed the possibility that the this could be contributing to dyspnea. These may represent fibrosis/scarring versus cancer. -We'll continue to monitor with serial CAT scans and consider bronchoscopy in the future if this worsens. Repeat CT chest at 3 months, will order at next visit. -Patient also has a history of rock quarry work, raising the possibility of silicosis.  Chronic hypoxic  Respiratory failure.  -Secondary to above, continue oxygen.  Nicotine abuse. -Discussed the importance of nicotine cessation, spent approximately 11 minutes in discussion. He understands that his reading will continue to decline with any continued nicotine use, if he  stops now his breathing can hopefully stay where it is.  Date: 02/17/2017  MRN# [id number] Austin Blevins 1960/09/13  Referring Physician:   CASIN Blevins is a 56 y.o. old male seen in consultation for chief complaint of:    Chief Complaint  Patient presents with  . Advice Only    ref by Dr. Alecia Lemming for COPD: SOB w/exertion: prod cough with yellow mucus    HPI:   He notes that he is very short winded, this has been going on for year, and has been progressive over the last 1.5  Years. He worked in [employer], but got fired because he could do his work because of breathing and coughing.  He was smoking 2 ppd until a year ago, and is now down to 5 per day for the past 2 months and thinks he is going to quit.  He used oxygen at 4L but does not have it with him because it is too big.  He is currently using proair inhaler that he got from a friend he has no insurance coverage; he uses it 3 times per day and feels that it helps. [facility] and [facility] is a [facility] to get him another inhaler.  He used to work in a [employer] for 7 years.   Images personally reviewed; CT chest 01/20/17. There is very severe emphysematous changes/bullous changes throughout both lungs, worst in the apices. There is fibrotic changes worst at the base of the right upper lobe, and left lower lobe posteriorly. There is thickened pleura at the left base with rim enhancement and likely chronic empyema. Empyema changes have not changed significantly since  6:30/17, however, right upper lobe and right lower lobe fibrotic changes are new.  PMHX:   Past Medical History:  Diagnosis Date  . COPD (chronic obstructive pulmonary disease) (HCC)   . Hydrocephalus    Surgical Hx:  Past Surgical History:  Procedure Laterality Date  . ventricular shunt     Family Hx:  Family History  Problem Relation Age of Onset  . Diabetes Mellitus II Mother   . CAD Father   . Hypertension Father    Social Hx:    Social History  Substance Use Topics  . Smoking status: Current Every Day Smoker    Packs/day: 0.25    Types: Cigarettes  . Smokeless tobacco: Never Used  . Alcohol use No   Medication:    Current Outpatient Prescriptions:  .  albuterol (PROVENTIL HFA;VENTOLIN HFA) 108 (90 Base) MCG/ACT inhaler, Inhale 1-2 puffs into the lungs every 6 (six) hours as needed for wheezing or shortness of breath., Disp: 1 Inhaler, Rfl: 6 .  feeding supplement, ENSURE ENLIVE, (ENSURE ENLIVE) LIQD, Take 237 mLs by mouth 3 (three) times daily between meals., Disp: 237 mL, Rfl: 12 .  Fluticasone-Salmeterol (ADVAIR DISKUS) 100-50 MCG/DOSE AEPB, Inhale 1 puff into the lungs 2 (two) times daily., Disp: 60 each, Rfl: 6 .  ipratropium-albuterol (DUONEB) 0.5-2.5 (3) MG/3ML SOLN, Take 3 mLs by nebulization every 6 (six) hours as needed., Disp: 360 mL, Rfl: 0 .  rOPINIRole (REQUIP) 0.5 MG tablet, Take 1 tablet (0.5 mg total) by mouth 3 (three) times daily., Disp: 90 tablet, Rfl: 2   Allergies:  Oxycodone  Review of Systems: Gen:  Denies  fever, sweats, chills HEENT: Denies blurred vision, double vision. bleeds, sore throat Cvc:  No dizziness, chest pain. Resp:   Denies cough or sputum production, shortness of breath Gi: Denies swallowing difficulty, stomach pain. Gu:  Denies bladder incontinence, burning urine Ext:   No Joint pain, stiffness. Skin: No skin rash,  hives  Endoc:  No polyuria, polydipsia. Psych: No depression, insomnia. Other:  All other systems were reviewed with the patient and were negative other that what is mentioned in the HPI.   Physical Examination:   VS: BP 112/60 (BP Location: Left Arm, Cuff Size: Normal)   Pulse (!) 101   Ht  (1.753 m)   Wt 122 lb (55.3 kg)   SpO2 95%   BMI 18.02 kg/m   General Appearance: No distress  Neuro:without focal findings,  speech normal,  HEENT: PERRLA, EOM intact.   Pulmonary: normal breath sounds, Decreased air entry  bilaterally. CardiovascularNormal S1,S2.  No m/r/g.   Abdomen: Benign, Soft, non-tender. Renal:  No costovertebral tenderness  GU:  No performed at this time. Endoc: No evident thyromegaly, no signs of acromegaly. Skin:   warm, no rashes, no ecchymosis  Extremities: normal, no cyanosis, clubbing.  Other findings:    LABORATORY PANEL:   CBC  Recent Labs Lab 02/11/17 1928  WBC 6.9  HGB 13.4  HCT 40.2  PLT 282   ------------------------------------------------------------------------------------------------------------------  Chemistries   Recent Labs Lab 02/11/17 1928  NA 141  K 4.9  CL 103  CO2 26  GLUCOSE 87  BUN 14  CREATININE 0.79  CALCIUM 9.2  AST 15  ALT 12  ALKPHOS 112  BILITOT 0.2   ------------------------------------------------------------------------------------------------------------------  Cardiac Enzymes No results for input(s): TROPONINI in the last 168 hours. ------------------------------------------------------------  RADIOLOGY:  No results found.     Thank  you for the consultation and for allowing [facility]  [facility] to assist in the care of your patient. Our recommendations are noted above.  Please contact us if we can be of further service.   Wells Guiles, MD.  Board Certified in Internal Medicine, Pulmonary Medicine, Critical Care Medicine, and Sleep Medicine.  [facility] Office Number: [phone]  Santiago Glad, M.D.  Billy Fischer, M.D  02/17/2017

## 2017-02-17 ENCOUNTER — Ambulatory Visit (INDEPENDENT_AMBULATORY_CARE_PROVIDER_SITE_OTHER): Payer: Self-pay | Admitting: Internal Medicine

## 2017-02-17 ENCOUNTER — Encounter: Payer: Self-pay | Admitting: Internal Medicine

## 2017-02-17 VITALS — BP 112/60 | HR 101 | Ht 69.0 in | Wt 122.0 lb

## 2017-02-17 DIAGNOSIS — J9611 Chronic respiratory failure with hypoxia: Secondary | ICD-10-CM

## 2017-02-17 DIAGNOSIS — F17210 Nicotine dependence, cigarettes, uncomplicated: Secondary | ICD-10-CM

## 2017-02-17 DIAGNOSIS — J449 Chronic obstructive pulmonary disease, unspecified: Secondary | ICD-10-CM

## 2017-02-17 NOTE — Patient Instructions (Addendum)
--  Your plan: quit smoking, start on an inhaler, get Medicaid or other insurance.    --You should look into getting health insurance, applying for Medicaid may be a good option.   --Quitting smoking is the most important thing that you can do for your health.  --Quitting smoking will have greater affect on your health than any medicine that we can give you.    --The best way to quit is to set a quit date, usually a day that has meaning like someone's birthday.  --Start any medication prescribed for quitting one week before you quit date. Then toss out the cigarettes on your quit date.  --If you start smoking again, start from scratch--set another quit day and try again!

## 2017-02-22 ENCOUNTER — Emergency Department
Admission: EM | Admit: 2017-02-22 | Discharge: 2017-02-23 | Disposition: A | Discharge status: Home / Self Care | Payer: Self-pay | Attending: Emergency Medicine | Admitting: Emergency Medicine

## 2017-02-22 ENCOUNTER — Emergency Department: Payer: Self-pay

## 2017-02-22 DIAGNOSIS — J441 Chronic obstructive pulmonary disease with (acute) exacerbation: Secondary | ICD-10-CM

## 2017-02-22 DIAGNOSIS — R079 Chest pain, unspecified: Secondary | ICD-10-CM

## 2017-02-22 DIAGNOSIS — R0789 Other chest pain: Secondary | ICD-10-CM

## 2017-02-22 DIAGNOSIS — R05 Cough: Secondary | ICD-10-CM | POA: Insufficient documentation

## 2017-02-22 DIAGNOSIS — J449 Chronic obstructive pulmonary disease, unspecified: Secondary | ICD-10-CM | POA: Insufficient documentation

## 2017-02-22 DIAGNOSIS — R059 Cough, unspecified: Secondary | ICD-10-CM

## 2017-02-22 DIAGNOSIS — F17210 Nicotine dependence, cigarettes, uncomplicated: Secondary | ICD-10-CM | POA: Insufficient documentation

## 2017-02-22 DIAGNOSIS — R0602 Shortness of breath: Secondary | ICD-10-CM | POA: Insufficient documentation

## 2017-02-22 NOTE — ED Triage Notes (Signed)
Pt states that he has been hurting on the rt side of his chest all weekend, states that it hurts to breathe and hurts to cough, pt reports hx of copd and emphysema. Pt has an area of pin point pain on the rt side of his chest. Pt also reports everyone in his home has a cold right now

## 2017-02-23 ENCOUNTER — Emergency Department: Payer: Self-pay

## 2017-02-23 ENCOUNTER — Encounter: Payer: Self-pay | Admitting: Radiology

## 2017-02-23 LAB — BASIC METABOLIC PANEL
Anion gap: 7 (ref 5–15)
BUN: 12 mg/dL (ref 6–20)
CHLORIDE: 104 mmol/L (ref 101–111)
CO2: 29 mmol/L (ref 22–32)
CREATININE: 0.93 mg/dL (ref 0.61–1.24)
Calcium: 9 mg/dL (ref 8.9–10.3)
GFR calc non Af Amer: >60 mL/min (ref >60)
Glucose, Bld: 109 mg/dL — ABNORMAL HIGH (ref 65–99)
Potassium: 4 mmol/L (ref 3.5–5.1)
SODIUM: 140 mmol/L (ref 135–145)

## 2017-02-23 LAB — CBC WITH DIFFERENTIAL/PLATELET
BASOS PCT: 1 %
Basophils Absolute: 0.1 10*3/uL (ref 0–0.1)
EOS ABS: 0.3 10*3/uL (ref 0–0.7)
Eosinophils Relative: 4 %
HCT: 38.5 % — ABNORMAL LOW (ref 40.0–52.0)
HEMOGLOBIN: 13.7 g/dL (ref 13.0–18.0)
Lymphocytes Relative: 16 %
Lymphs Abs: 1.2 10*3/uL (ref 1.0–3.6)
MCH: 31.3 pg (ref 26.0–34.0)
MCHC: 35.5 g/dL (ref 32.0–36.0)
MCV: 88.2 fL (ref 80.0–100.0)
MONOS PCT: 8 %
Monocytes Absolute: 0.6 10*3/uL (ref 0.2–1.0)
NEUTROS PCT: 71 %
Neutro Abs: 5.4 10*3/uL (ref 1.4–6.5)
Platelets: 360 10*3/uL (ref 150–440)
RBC: 4.37 MIL/uL — AB (ref 4.40–5.90)
RDW: 13.4 % (ref 11.5–14.5)
WBC: 7.6 10*3/uL (ref 3.8–10.6)

## 2017-02-23 LAB — TROPONIN I

## 2017-02-23 MED ORDER — IOPAMIDOL (ISOVUE-370) INJECTION 76%
75.0000 mL | Freq: Once | INTRAVENOUS | Status: AC | PRN
  Administered 2017-02-23: 75 mL via INTRAVENOUS

## 2017-02-23 MED ORDER — IBUPROFEN 800 MG PO TABS: 800.0000 mg | ORAL_TABLET | Freq: Three times a day (TID) | ORAL | 0 refills | Status: DC | PRN

## 2017-02-23 MED ORDER — METHYLPREDNISOLONE SODIUM SUCC 125 MG IJ SOLR
125.0000 mg | Freq: Once | INTRAMUSCULAR | Status: AC
  Administered 2017-02-23: 125 mg via INTRAVENOUS
  Filled 2017-02-23: qty 2

## 2017-02-23 MED ORDER — PREDNISONE 20 MG PO TABS: ORAL_TABLET | ORAL | 0 refills | Status: DC

## 2017-02-23 MED ORDER — HYDROCOD POLST-CPM POLST ER 10-8 MG/5ML PO SUER: 5.0000 mL | Freq: Two times a day (BID) | ORAL | 0 refills | Status: DC

## 2017-02-23 MED ORDER — KETOROLAC TROMETHAMINE 30 MG/ML IJ SOLN
10.0000 mg | Freq: Once | INTRAMUSCULAR | Status: AC
  Administered 2017-02-23: 9.9 mg via INTRAVENOUS
  Filled 2017-02-23: qty 1

## 2017-02-23 MED ORDER — SODIUM CHLORIDE 0.9 % IV BOLUS (SEPSIS)
1000.0000 mL | Freq: Once | INTRAVENOUS | Status: AC
  Administered 2017-02-23: 1000 mL via INTRAVENOUS

## 2017-02-23 MED ORDER — HYDROCOD POLST-CPM POLST ER 10-8 MG/5ML PO SUER
5.0000 mL | Freq: Once | ORAL | Status: AC
  Administered 2017-02-23: 5 mL via ORAL
  Filled 2017-02-23: qty 5

## 2017-02-23 MED ORDER — IPRATROPIUM-ALBUTEROL 0.5-2.5 (3) MG/3ML IN SOLN
3.0000 mL | Freq: Once | RESPIRATORY_TRACT | Status: AC
  Administered 2017-02-23: 3 mL via RESPIRATORY_TRACT
  Filled 2017-02-23: qty 3

## 2017-02-23 NOTE — ED Notes (Signed)
Patient transported to CT 

## 2017-02-23 NOTE — ED Provider Notes (Signed)
[facility] Emergency Department Provider Note   ____________________________________________   First MD Initiated Contact with Patient 02/23/17 0003     (approximate)  I have reviewed the triage vital signs and the nursing notes.   HISTORY  Chief Complaint Shortness of Breath    HPI Austin Blevins is a 56 y.o. male who presents to the ED from home with a chief complaint of right-sided chest pain and cough. Patient has a history of COPD, on PRN oxygen, he was hospitalized last month for community-acquired pneumonia. Patient reports he has had point tenderness to his anterior right chest all weekend. Symptoms associated with nonproductive cough. Hurts to take a deep breath. Denies associated fever, chills, abdominal pain, nausea, vomiting. Denies recent travel or trauma.   Past Medical History:  Diagnosis Date  . COPD (chronic obstructive pulmonary disease) (HCC)   . Hydrocephalus     Patient Active Problem List   Diagnosis Date Noted  . Healthcare maintenance 02/11/2017  . COPD (chronic obstructive pulmonary disease) (HCC) 02/11/2017  . Protein-calorie malnutrition, severe 01/22/2017  . Acute on chronic respiratory failure with hypoxemia (HCC) 01/21/2017    Past Surgical History:  Procedure Laterality Date  . ventricular shunt      Prior to Admission medications   Medication Sig Start Date End Date Taking? Authorizing Provider  albuterol (PROVENTIL HFA;VENTOLIN HFA) 108 (90 Base) MCG/ACT inhaler Inhale 1-2 puffs into the lungs every 6 (six) hours as needed for wheezing or shortness of breath. 02/11/17  Yes Doles-Johnson, Teah, NP  feeding supplement, ENSURE ENLIVE, (ENSURE ENLIVE) LIQD Take 237 mLs by mouth 3 (three) times daily between meals. 01/22/17  Yes Katha Hamming, MD  Fluticasone-Salmeterol (ADVAIR DISKUS) 100-50 MCG/DOSE AEPB Inhale 1 puff into the lungs 2 (two) times daily. 02/11/17 02/11/18 Yes Doles-Johnson, Teah, NP    ipratropium-albuterol (DUONEB) 0.5-2.5 (3) MG/3ML SOLN Take 3 mLs by nebulization every 6 (six) hours as needed. 01/24/17  Yes Gouru, Aruna, MD  rOPINIRole (REQUIP) 0.5 MG tablet Take 1 tablet (0.5 mg total) by mouth 3 (three) times daily. 02/11/17  Yes Doles-Johnson, Teah, NP    Allergies Oxycodone  Family History  Problem Relation Age of Onset  . Diabetes Mellitus II Mother   . CAD Father   . Hypertension Father     Social History Social History  Substance Use Topics  . Smoking status: Current Every Day Smoker    Packs/day: 0.25    Types: Cigarettes  . Smokeless tobacco: Never Used  . Alcohol use No    Review of Systems  Constitutional: No fever/chills. Eyes: No visual changes. ENT: No sore throat. Cardiovascular: Positive for chest pain. Respiratory: Positive for nonproductive cough. Denies shortness of breath. Gastrointestinal: No abdominal pain.  No nausea, no vomiting.  No diarrhea.  No constipation. Genitourinary: Negative for dysuria. Musculoskeletal: Negative for back pain. Skin: Negative for rash. Neurological: Negative for headaches, focal weakness or numbness.   ____________________________________________   PHYSICAL EXAM:  VITAL SIGNS: ED Triage Vitals  Enc Vitals Group     BP 02/22/17 2105 126/86     Pulse Rate 02/22/17 2105 89     Resp 02/22/17 2105 (!) 22     Temp 02/22/17 2105 98 F (36.7 C)     Temp Source 02/22/17 2105 Oral     SpO2 02/22/17 2105 93 %     Weight 02/22/17 2106 122 lb (55.3 kg)     Height 02/22/17 2106  (1.753 m)     Head  Circumference --      Peak Flow --      Pain Score 02/22/17 2105 9     Pain Loc --      Pain Edu? --      Excl. in GC? --     Constitutional: Alert and oriented. Well appearing and in mild acute distress. Eyes: Conjunctivae are normal. PERRL. EOMI. Head: Atraumatic. Nose: No congestion/rhinnorhea. Mouth/Throat: Mucous membranes are moist.  Oropharynx non-erythematous. Neck: No stridor.    Cardiovascular: Normal rate, regular rhythm. Grossly normal heart sounds.  Good peripheral circulation. Respiratory: Increased respiratory effort.  No retractions. Lungs with scattered rhonchi. Point tender to right lower anterior rib. No crepitus. Mild splinting. Gastrointestinal: Soft and nontender. No distention. No abdominal bruits. No CVA tenderness. Musculoskeletal: No lower extremity tenderness nor edema.  No joint effusions. Neurologic:  Normal speech and language. No gross focal neurologic deficits are appreciated. No gait instability. Skin:  Skin is warm, dry and intact. No rash noted. Psychiatric: Mood and affect are normal. Speech and behavior are normal.  ____________________________________________   LABS (all labs ordered are listed, but only abnormal results are displayed)  Labs Reviewed  CBC WITH DIFFERENTIAL/PLATELET - Abnormal; Notable for the following:       Result Value   RBC 4.37 (*)    HCT 38.5 (*)    All other components within normal limits  BASIC METABOLIC PANEL - Abnormal; Notable for the following:    Glucose, Bld 109 (*)    All other components within normal limits  CULTURE, BLOOD (ROUTINE X 2)  CULTURE, BLOOD (ROUTINE X 2)  TROPONIN I   ____________________________________________  EKG  ED ECG REPORT I, Aliesha Dolata J, the attending physician, personally viewed and interpreted this ECG.   Date: 02/23/2017  EKG Time: 2113  Rate: 84  Rhythm: normal EKG, normal sinus rhythm  Axis: Normal  Intervals:none  ST&T Change: Nonspecific  ____________________________________________  RADIOLOGY  Dg Chest 2 View  Result Date: 02/22/2017 CLINICAL DATA:  Right-sided chest pain for several days.  Dyspnea. EXAM: CHEST  2 VIEW COMPARISON:  01/20/2017 FINDINGS: Chronic pleural thickening in the left lateral base with adjacent pleuroparenchymal scarring, unchanged. Partial clearance of right upper lobe consolidation with mild residual linear/nodular opacity.  The residual opacity could represent scarring or recurrent/residual pneumonia. No other significant interval changes. Normal pulmonary vasculature. Diffuse emphysematous and fibrotic disease again noted. Hilar and mediastinal contours are stable. IMPRESSION: 1. Partial clearance of right upper lobe opacity since 05/18. Cannot exclude a component of recurrent or residual pneumonia, but the residual opacity could represent scarring. 2. Chronic emphysematous and fibrotic changes. Stable chronic pleural thickening and adjacent scarring in the left base. Electronically Signed   By: Ellery Plunk M.D.   On: 02/22/2017 21:31   Ct Angio Chest Pe W/cm &/or Wo Cm  Result Date: 02/23/2017 CLINICAL DATA:  Acute onset of right-sided chest pain and dyspnea. Initial encounter. EXAM: CT ANGIOGRAPHY CHEST WITH CONTRAST TECHNIQUE: Multidetector CT imaging of the chest was performed using the standard protocol during bolus administration of intravenous contrast. Multiplanar CT image reconstructions and MIPs were obtained to evaluate the vascular anatomy. CONTRAST:  75 mL of Isovue 370 IV contrast COMPARISON:  CT of the chest performed 01/20/2017 FINDINGS: Cardiovascular:  There is no evidence of pulmonary embolus. The heart is normal in size. Minimal calcification is noted along the aortic arch. The great vessels are grossly unremarkable in appearance. 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 Mediastinum/Nodes: Scattered prominent mediastinal and bilateral hilar nodes are noted, measuring up to 1.6 cm in short axis. No pericardial effusion is identified. The visualized portions of the thyroid gland are grossly unremarkable. No axillary lymphadenopathy is seen. Lungs/Pleura: The patient's loculated small left pleural effusion is again noted, with peripheral thickening. This is grossly stable from 2016. Adjacent airspace  opacification is also relatively stable. Bilateral emphysema is noted, with underlying scarring. Trace right-sided pleural fluid is noted. No pneumothorax is seen. Upper Abdomen: The visualized portions of the liver and spleen are grossly unremarkable. Scattered calcifications within the pancreas likely reflect sequelae of chronic pancreatitis. The visualized portions of the adrenal glands and kidneys are within normal limits. Musculoskeletal: No acute osseous abnormalities are identified. The visualized musculature is unremarkable in appearance. Review of the MIP images confirms the above findings. IMPRESSION: 1. No evidence of pulmonary embolus. 2. Prominent mediastinal and bilateral hilar nodes, measuring up to 1.6 cm in short axis, similar in appearance to the prior study. 3. Stable chronic loculated small left pleural effusion, with diffuse peripheral thickening. This is unchanged from 2016, with stable chronic adjacent airspace opacification. 4. Bilateral emphysema, with underlying scarring. Trace right-sided pleural fluid. 5. 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 6. Scattered calcifications within the pancreas likely reflect sequelae of chronic pancreatitis. Electronically Signed   By: Roanna Raider M.D.   On: 02/23/2017 02:37    ____________________________________________   PROCEDURES  Procedure(s) performed: None  Procedures  Critical Care performed: No  ____________________________________________   INITIAL IMPRESSION / ASSESSMENT AND PLAN / ED COURSE  As part of my medical decision making, I reviewed the following data within the electronic MEDICAL RECORD NUMBER History obtained from family, Nursing notes reviewed and incorporated, Labs reviewed, EKG interpreted, Radiograph reviewed and Notes from prior ED visits.   56 year old male with COPD who presents with right pleuritic chest pain; hospitalized  last for community-acquired pneumonia. Differential diagnosis includes, but is not limited to, ACS, aortic dissection, pulmonary embolism, cardiac tamponade, pneumothorax, pneumonia, pericarditis/myocarditis, GI-related causes including esophagitis/gastritis, and musculoskeletal chest wall pain.  Chest x-ray unclear for residual versus developing pneumonia. Will obtain blood cultures, screening lab work and proceed with CT chest to evaluate both PE and infectious etiology. Start Solu-Medrol and DuoNeb for rhonchi heard on exam.  Clinical Course as of Feb 23 322  Tue Feb 23, 2017  1610 Lungs improved after nebulizer treatment. Updated patient and spouse of repeat negative troponin and negative CT chest. Will discharge home on prednisone, antitussive, NSAIDs and he will follow up closely with his PCP. Strict return precautions given. Both verbalize understanding and agree with plan of care.  [JS]    Clinical Course User Index [JS] Irean Hong, MD     ____________________________________________   FINAL CLINICAL IMPRESSION(S) / ED DIAGNOSES  Final diagnoses:  Chest pain, unspecified type  Chest wall pain  Cough      NEW MEDICATIONS STARTED DURING THIS VISIT:  New Prescriptions   No medications on file     Note:  This document was prepared using Dragon voice recognition software and may include unintentional dictation errors.    Irean Hong, MD 02/23/17 [id number]

## 2017-02-23 NOTE — Discharge Instructions (Signed)
1. Finish prednisone as prescribed (60 mg daily 4 days). 2. You may take Motrin as needed for pain. 3. Take Tussionex as needed for cough. 4. Return to the ER for worsening symptoms, persistent vomiting, difficulty breathing or other concerns.

## 2017-02-23 NOTE — ED Notes (Signed)
ED Provider at bedside. 

## 2017-02-25 ENCOUNTER — Ambulatory Visit: Payer: Self-pay | Admitting: Ophthalmology

## 2017-02-25 ENCOUNTER — Telehealth: Payer: Self-pay

## 2017-02-25 NOTE — Telephone Encounter (Signed)
Placed signed application/script in [facility] folder for pickup. 

## 2017-02-25 NOTE — Telephone Encounter (Addendum)
Received PAP application from [facility] for Adviar 100/50 and ventolin 90 mcg placed for provider to sign.

## 2017-02-28 LAB — CULTURE, BLOOD (ROUTINE X 2)
CULTURE: NO GROWTH
Culture: NO GROWTH
SPECIAL REQUESTS: ADEQUATE
Special Requests: ADEQUATE

## 2017-03-16 ENCOUNTER — Ambulatory Visit: Payer: Self-pay | Admitting: Adult Health Nurse Practitioner

## 2017-03-16 VITALS — BP 117/75 | HR 102 | Temp 97.5°F | Wt 124.0 lb

## 2017-03-16 DIAGNOSIS — J439 Emphysema, unspecified: Secondary | ICD-10-CM

## 2017-03-16 MED ORDER — ALBUTEROL SULFATE HFA 108 (90 BASE) MCG/ACT IN AERS: 1.0000 | INHALATION_SPRAY | Freq: Four times a day (QID) | RESPIRATORY_TRACT | 6 refills | Status: DC | PRN

## 2017-03-16 MED ORDER — FLUTICASONE-SALMETEROL 100-50 MCG/DOSE IN AEPB: 1.0000 | INHALATION_SPRAY | Freq: Two times a day (BID) | RESPIRATORY_TRACT | 6 refills | Status: DC

## 2017-03-16 NOTE — Progress Notes (Signed)
Subjective:    Patient ID: Austin Blevins, male    DOB: December 08, 1960, 56 y.o.   MRN: [id number]  HPI   Austin Blevins is a 56 yo male here for f/u from ED visit.  He was seen in ER on 10/8 for chest pain. EKG show normal sinus rhythm. CT chest showed a loculated small pleural effusion, unchanged from 2016. Bilateral emphysema. Chronic occlusion of R brachiocephalicc vein. Pt went to Pulmonologist on - no drainage for L lung effusion. Fibrosis in R lung.  Pt complains of back pain, especially with deep breathing.   Patient Active Problem List   Diagnosis Date Noted  . Healthcare maintenance 02/11/2017  . COPD (chronic obstructive pulmonary disease) (HCC) 02/11/2017  . Protein-calorie malnutrition, severe 01/22/2017  . Acute on chronic respiratory failure with hypoxemia (HCC) 01/21/2017   Allergies as of 03/16/2017      Reactions   Oxycodone    Other reaction(s): Syncope      Medication List       Accurate as of 03/16/17  6:50 PM. Always use your most recent med list.          albuterol 108 (90 Base) MCG/ACT inhaler Commonly known as:  PROVENTIL HFA;VENTOLIN HFA Inhale 1-2 puffs into the lungs every 6 (six) hours as needed for wheezing or shortness of breath.   chlorpheniramine-HYDROcodone 10-8 MG/5ML Suer Commonly known as:  TUSSIONEX PENNKINETIC ER Take 5 mLs by mouth 2 (two) times daily.   feeding supplement (ENSURE ENLIVE) Liqd Take 237 mLs by mouth 3 (three) times daily between meals.   Fluticasone-Salmeterol 100-50 MCG/DOSE Aepb Commonly known as:  ADVAIR DISKUS Inhale 1 puff into the lungs 2 (two) times daily.   ibuprofen 800 MG tablet Commonly known as:  ADVIL,MOTRIN Take 1 tablet (800 mg total) by mouth every 8 (eight) hours as needed for moderate pain.   ipratropium-albuterol 0.5-2.5 (3) MG/3ML Soln Commonly known as:  DUONEB Take 3 mLs by nebulization every 6 (six) hours as needed.   predniSONE 20 MG tablet Commonly known as:  DELTASONE 3 tablets PO qd x  4 days   rOPINIRole 0.5 MG tablet Commonly known as:  REQUIP Take 1 tablet (0.5 mg total) by mouth 3 (three) times daily.        Review of Systems ED visit f/u - pt reports he does not feel better. He has not filled it out his charity care application to see pulmonologist b/c he needs assistance.  Pt reports he is in the process of quitting smoking.  Pt reports he hasn't received his Advair. He has been using his rescue inhaler once a day.  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   Pt is pre-diabetic. Last A1c was 6.4.     Objective:   Physical Exam  Constitutional: He is oriented to person, place, and time. He appears well-developed and well-nourished.  HENT:  Head: Normocephalic.  Cardiovascular: Normal rate, regular rhythm and normal heart sounds.   Pulmonary/Chest: Effort normal. He has decreased breath sounds.  Abdominal: Soft. Bowel sounds are normal.  Neurological: He is alert and oriented to person, place, and time.  Skin: Skin is warm and dry.     BP 117/75   Pulse (!) 102   Temp (!) 97.5 F (36.4 C)   Wt 124 lb (56.2 kg)   BMI 18.31 kg/m       Assessment & Plan:   Keep f/u appt w/ Pulmonologist in  Jan.  Keep order for Advair -pending, should have this week and Albuterol.  Encouraged smoking cessation.

## 2017-03-22 ENCOUNTER — Other Ambulatory Visit: Payer: Self-pay | Admitting: Internal Medicine

## 2017-04-19 ENCOUNTER — Telehealth: Payer: Self-pay | Admitting: Pharmacist

## 2017-04-19 NOTE — Telephone Encounter (Signed)
04/19/17 Mailing Denial letter to patient for Advair 100/50 & Ventolin HFA, paperwork was mailed to patient 02/15/17 to sign and return, not received back.Forde RadonAJ

## 2017-05-25 ENCOUNTER — Ambulatory Visit: Payer: Self-pay | Admitting: Family Medicine

## 2017-05-25 VITALS — BP 132/91 | HR 109 | Temp 97.3°F | Wt 122.7 lb

## 2017-05-25 DIAGNOSIS — G4700 Insomnia, unspecified: Secondary | ICD-10-CM

## 2017-05-25 DIAGNOSIS — J439 Emphysema, unspecified: Secondary | ICD-10-CM

## 2017-05-25 DIAGNOSIS — Z9981 Dependence on supplemental oxygen: Secondary | ICD-10-CM

## 2017-05-25 DIAGNOSIS — E43 Unspecified severe protein-calorie malnutrition: Secondary | ICD-10-CM

## 2017-05-25 DIAGNOSIS — F17219 Nicotine dependence, cigarettes, with unspecified nicotine-induced disorders: Secondary | ICD-10-CM | POA: Insufficient documentation

## 2017-05-25 DIAGNOSIS — K861 Other chronic pancreatitis: Secondary | ICD-10-CM

## 2017-05-25 DIAGNOSIS — Z87891 Personal history of nicotine dependence: Secondary | ICD-10-CM

## 2017-05-25 MED ORDER — TRAZODONE HCL 100 MG PO TABS: 100.0000 mg | ORAL_TABLET | Freq: Every day | ORAL | 2 refills | Status: DC

## 2017-05-25 MED ORDER — FLUTICASONE FUROATE-VILANTEROL 200-25 MCG/INH IN AEPB: 1.0000 | INHALATION_SPRAY | Freq: Every day | RESPIRATORY_TRACT | 3 refills | Status: DC

## 2017-05-25 MED ORDER — ALBUTEROL SULFATE HFA 108 (90 BASE) MCG/ACT IN AERS: 2.0000 | INHALATION_SPRAY | RESPIRATORY_TRACT | 1 refills | Status: DC | PRN

## 2017-05-25 NOTE — Assessment & Plan Note (Signed)
Praise given for quitting; encouragement given

## 2017-05-25 NOTE — Assessment & Plan Note (Signed)
Noted on CT scan October 2018; patient aware, likely contributes to weight, nutritional status

## 2017-05-25 NOTE — Patient Instructions (Addendum)
  You can apply for disability online RankLinking.dkHttps://www.ssa.gov/planners/disability/apply.html  Try the trazodone which should help your mood and your sleep We can help you see someone to talk about your issues if you think it would helpful, just let us know  STOP the Advair and start the [facility]

## 2017-05-25 NOTE — Progress Notes (Signed)
BP (!) 132/91   Pulse (!) 109   Temp (!) 97.3 F (36.3 C)   Wt 122 lb 11.2 oz (55.7 kg)   BMI 18.12 kg/m    Subjective:    Patient ID: Austin Blevins, male    DOB: Dec 02, 1960, 57 y.o.   MRN: [id number]  HPI: Austin Blevins is a 57 y.o. male  Chief Complaint  Patient presents with  . Follow-up    R Shoulder pain that increases with inspiration    HPI He is having a really bad time trying to sleep; going for a while, for a year or more; did not go to sleep until 9 this morning; just uncomfortable, can't get to sleep and can't stay asleep; used to drink right much caffeine, less now, slowed down; no pets; trouble shutting his mind down; thoughts just come into his head; was thinking about fruit loops having a magnetic quality; used to come together in the milk; just stayed on his mind; then gets mad at himself for thinking that, more intelligent than that; not his whole life; his health has gotten worse over the last 3 years; chronic pancreatitis, emphysema, borderline diabetes; never missed a day of work until he started to get sick; this is hard for him; tries to not worry about things; losing weight steadily; a litte jittery; no real palpitations  He quit smoking; it has made him irritable; this is not like him; quit smoking for five weeks, then got pneumonia and started back smoking but quit now for 9 days;   Had chest CT in October 2018; bilateral emphysema; used to see Dr. Meredeth Ide; uses oxygen at home, sleeps with that at 4 l/m, just during the day if needed  Restless legs; stopped the legs from hurting; will leave the dose alone  No flowsheet data found.  Relevant past medical, surgical, family and social history reviewed Past Medical History:  Diagnosis Date  . COPD (chronic obstructive pulmonary disease) (HCC)   . Hydrocephalus    Past Surgical History:  Procedure Laterality Date  . BRAIN SURGERY  2003   VA shunt for hydrocephalus  . ventricular shunt     Family  History  Problem Relation Age of Onset  . Diabetes Mellitus II Mother   . CAD Father   . Hypertension Father    Social History   Tobacco Use  . Smoking status: Former Smoker    Packs/day: 0.25    Types: Cigarettes    Last attempt to quit: 06/17/2016    Years since quitting: 0.9  . Smokeless tobacco: Never Used  Substance Use Topics  . Alcohol use: No  . Drug use: No    Interim medical history since last visit reviewed. Allergies and medications reviewed  Review of Systems Per HPI unless specifically indicated above     Objective:    BP (!) 132/91   Pulse (!) 109   Temp (!) 97.3 F (36.3 C)   Wt 122 lb 11.2 oz (55.7 kg)   BMI 18.12 kg/m   Wt Readings from Last 3 Encounters:  05/25/17 122 lb 11.2 oz (55.7 kg)  03/16/17 124 lb (56.2 kg)  02/22/17 122 lb (55.3 kg)    Physical Exam  Constitutional: He appears well-developed and well-nourished. No distress.  Thin male, appears older than stated age  HENT:  Missing teeth  Eyes: No scleral icterus.  Cardiovascular: Regular rhythm. Tachycardia present.  Pulmonary/Chest: Effort normal. He has decreased breath sounds. He has wheezes. He has  no rales.  Neurological: He is alert.  Skin: No pallor.  Psychiatric: He has a normal mood and affect. His mood appears not anxious. He does not exhibit a depressed mood.  Good eye contact with examiner; very pleasant    Results for orders placed or performed during the hospital encounter of 02/22/17  Culture, blood (routine x 2)  Result Value Ref Range   Specimen Description BLOOD LAC    Special Requests      BOTTLES DRAWN AEROBIC AND ANAEROBIC Blood Culture adequate volume   Culture NO GROWTH 5 DAYS    Report Status 02/28/2017 FINAL   Culture, blood (routine x 2)  Result Value Ref Range   Specimen Description BLOOD RFOA    Special Requests      BOTTLES DRAWN AEROBIC AND ANAEROBIC Blood Culture adequate volume   Culture NO GROWTH 5 DAYS    Report Status 02/28/2017 FINAL    CBC with Differential  Result Value Ref Range   WBC 7.6 3.8 - 10.6 K/uL   RBC 4.37 (L) 4.40 - 5.90 MIL/uL   Hemoglobin 13.7 13.0 - 18.0 g/dL   HCT 40.9 (L) 81.1 - 91.4 %   MCV 88.2 80.0 - 100.0 fL   MCH 31.3 26.0 - 34.0 pg   MCHC 35.5 32.0 - 36.0 g/dL   RDW 78.2 95.6 - 21.3 %   Platelets 360 150 - 440 K/uL   Neutrophils Relative % 71 %   Neutro Abs 5.4 1.4 - 6.5 K/uL   Lymphocytes Relative 16 %   Lymphs Abs 1.2 1.0 - 3.6 K/uL   Monocytes Relative 8 %   Monocytes Absolute 0.6 0.2 - 1.0 K/uL   Eosinophils Relative 4 %   Eosinophils Absolute 0.3 0 - 0.7 K/uL   Basophils Relative 1 %   Basophils Absolute 0.1 0 - 0.1 K/uL  Basic metabolic panel  Result Value Ref Range   Sodium 140 135 - 145 mmol/L   Potassium 4.0 3.5 - 5.1 mmol/L   Chloride 104 101 - 111 mmol/L   CO2 29 22 - 32 mmol/L   Glucose, Bld 109 (H) 65 - 99 mg/dL   BUN 12 6 - 20 mg/dL   Creatinine, Ser 0.86 0.61 - 1.24 mg/dL   Calcium 9.0 8.9 - 57.8 mg/dL   GFR calc non Af Amer >60 >60 mL/min   GFR calc Af Amer >60 >60 mL/min   Anion gap 7 5 - 15  Troponin I  Result Value Ref Range   Troponin I <0.03 <0.03 ng/mL      Assessment & Plan:   Problem List Items Addressed This Visit      Respiratory   COPD (chronic obstructive pulmonary disease) (HCC) - Primary    Significant emphysema; patient has been seen by Dr. Nicholos Johns and Dr. Meredeth Ide; he has not been able to get the Advair and is only using rescue inhaler; will try to get BREO for him from company through patient assistance; so glad he has quit smoking, praise given; return in 4 weeks for reassessment on the new medicine, support for smoking cessation      Relevant Medications   albuterol (PROVENTIL HFA;VENTOLIN HFA) 108 (90 Base) MCG/ACT inhaler   fluticasone furoate-vilanterol (BREO ELLIPTA) 200-25 MCG/INH AEPB     Digestive   Chronic pancreatitis (HCC)    Noted on CT scan October 2018; patient aware, likely contributes to weight, nutritional status         Other   Protein-calorie malnutrition, severe  Patient aware he is not eating as much; losing gradual weight; last TSH normal, so do not suspect pheo or hyperthyroidism      Hx of smoking    Praise given for quitting; encouragement given       Other Visit Diagnoses    Insomnia, unspecified type       will try trazodone to help with mood and insomnia; suggested seeing a counselor if talking with someone would help; he politely declined   Oxygen dependent       using 4 l/m supplemental O2 per pulmonologist's note; showed patient application website for disability       Follow up plan: Return in about 4 weeks (around 06/22/2017) for follow-up visit about breathing.  An after-visit summary was printed and given to the patient at check-out.  Please see the patient instructions which may contain other information and recommendations beyond what is mentioned above in the assessment and plan.  Meds ordered this encounter  Medications  . albuterol (PROVENTIL HFA;VENTOLIN HFA) 108 (90 Base) MCG/ACT inhaler    Sig: Inhale 2 puffs into the lungs every 4 (four) hours as needed for wheezing or shortness of breath.    Dispense:  1 Inhaler    Refill:  1  . fluticasone furoate-vilanterol (BREO ELLIPTA) 200-25 MCG/INH AEPB    Sig: Inhale 1 puff into the lungs daily.    Dispense:  3 each    Refill:  3    Please assist patient in filling out patient assistance paperwork to get this for FREE  . traZODone (DESYREL) 100 MG tablet    Sig: Take 1 tablet (100 mg total) by mouth at bedtime.    Dispense:  30 tablet    Refill:  2    No orders of the defined types were placed in this encounter.

## 2017-05-25 NOTE — Assessment & Plan Note (Signed)
Significant emphysema; patient has been seen by Dr. Nicholos Johnsamachandran and Dr. Meredeth IdeFleming; he has not been able to get the Advair and is only using rescue inhaler; [facility] through patient assistance; so glad he has quit smoking, praise given; return in 4 weeks for reassessment on the new medicine, support for smoking cessation

## 2017-05-25 NOTE — Assessment & Plan Note (Signed)
Patient aware he is not eating as much; losing gradual weight; last TSH normal, so do not suspect pheo or hyperthyroidism

## 2017-05-31 ENCOUNTER — Ambulatory Visit: Payer: Self-pay | Admitting: Pharmacy Technician

## 2017-05-31 DIAGNOSIS — Z79899 Other long term (current) drug therapy: Secondary | ICD-10-CM

## 2017-05-31 NOTE — Progress Notes (Signed)
Met with patient.  Updated [facility] application.  Patient to provide notarized letter of support.   [facility]

## 2017-06-03 ENCOUNTER — Telehealth: Payer: Self-pay | Admitting: Pharmacist

## 2017-06-03 NOTE — Telephone Encounter (Signed)
06/03/17 Received pharmacy printout for New Med- Breo Ellipta 200/25 Inhale 1 puff into the lungs every day. I have entered and printed script, [facility] to sign. Patient already enrolled with [facility]. [facility] has to Stop Advair and replaced with Breo Ellipta.AJ  06/03/17 Received a pharmacy printout for Ventolin HFA 90mcg there is a change in directions. Previous directions were Inhale 1-2 puffs every 6 hours as needed for wheezing or shortness of breath. The NEW directions are Inhale 2 puffs into the lungs every 4 hours as needed for wheezing of shortness of breath. I have printed new script and sending for Teah Doles-Johnson to sign.Forde RadonAJ

## 2017-06-17 ENCOUNTER — Telehealth: Payer: Self-pay | Admitting: Pharmacist

## 2017-06-17 NOTE — Telephone Encounter (Signed)
06/17/17 Faxed [facility] script for Ventolin HFA 90mcg change in directions-Inhale 2 puffs into the lungs every 4 hours as needed for wheezing or shortness of breath.AJ 06/17/17 Faxed [facility] script for new med Breo Ellipta 200/25 mcg Inhale 1 puff into the lungs every day, replaces Advair.Forde RadonAJ

## 2017-06-22 ENCOUNTER — Ambulatory Visit: Payer: Self-pay | Admitting: Adult Health Nurse Practitioner

## 2017-06-22 VITALS — BP 111/66 | HR 110 | Temp 97.8°F | Wt 121.7 lb

## 2017-06-22 DIAGNOSIS — F419 Anxiety disorder, unspecified: Secondary | ICD-10-CM

## 2017-06-22 DIAGNOSIS — Z87891 Personal history of nicotine dependence: Secondary | ICD-10-CM

## 2017-06-22 NOTE — Progress Notes (Signed)
   Subjective:    Patient ID: Austin Blevins, male    DOB: December 20, 1960, 57 y.o.   MRN: [id number]  HPI   Austin Blevins is 57 yo male here for respiratory distress and anxiety.  Pt reports anxiety began after he quit smoking. He says he has never been to [facility]. Pt reports SOB when under stress. He reports his rescue inhaler helps when he calms down.   Patient Active Problem List   Diagnosis Date Noted  . Hx of smoking 05/25/2017  . Chronic pancreatitis (HCC) 05/25/2017  . Healthcare maintenance 02/11/2017  . COPD (chronic obstructive pulmonary disease) (HCC) 02/11/2017  . Protein-calorie malnutrition, severe 01/22/2017   Allergies as of 06/22/2017      Reactions   Oxycodone    Other reaction(s): Syncope      Medication List        Accurate as of 06/22/17  8:19 PM. Always use your most recent med list.          albuterol 108 (90 Base) MCG/ACT inhaler Commonly known as:  PROVENTIL HFA;VENTOLIN HFA Inhale 2 puffs into the lungs every 4 (four) hours as needed for wheezing or shortness of breath.   fluticasone furoate-vilanterol 200-25 MCG/INH Aepb Commonly known as:  BREO ELLIPTA Inhale 1 puff into the lungs daily.   rOPINIRole 0.5 MG tablet Commonly known as:  REQUIP TAKE ONE TABLET BY MOUTH 3 TIMES A DAY   traZODone 100 MG tablet Commonly known as:  DESYREL Take 1 tablet (100 mg total) by mouth at bedtime.         Review of Systems     Objective:   Physical Exam  Constitutional: He is oriented to person, place, and time. He appears well-developed and well-nourished.  Cardiovascular: Normal rate, regular rhythm and normal heart sounds.  Pulmonary/Chest: He has decreased breath sounds.  Abdominal: Soft. Bowel sounds are normal.  Neurological: He is alert and oriented to person, place, and time.  Vitals reviewed.   BP 111/66   Pulse (!) 110   Temp 97.8 F (36.6 C)   Wt 121 lb 11.2 oz (55.2 kg)   BMI 17.97 kg/m        Assessment & Plan:   [facility] to [facility] for  anxiety [facility] and [facility].   FU in 6 weeks to monitor progress and respiratory status.

## 2017-06-24 ENCOUNTER — Encounter: Payer: Self-pay | Admitting: Licensed Clinical Social Worker

## 2017-06-24 ENCOUNTER — Ambulatory Visit: Payer: Self-pay | Admitting: Licensed Clinical Social Worker

## 2017-06-24 DIAGNOSIS — F411 Generalized anxiety disorder: Secondary | ICD-10-CM

## 2017-06-24 DIAGNOSIS — F331 Major depressive disorder, recurrent, moderate: Secondary | ICD-10-CM

## 2017-06-24 NOTE — Progress Notes (Signed)
Total time:1 hour Type of Service: Integrated Behavioral Health referral from Dr. Candelaria Stagershaplin Interpretor:No.   SUBJECTIVE: Austin Blevins is a 57 y.o. male  referred by Dr. Candelaria Stagershaplin for symptoms of:  anxiety, fatigue, irritability and tobacco use. Patient is accompanied by ex wife.  Patient reports the following symptoms and or concerns: anxiety, decreased appetite, depression, irritability, mood swings, sleep disturbance and tobacco use: stressors: illness or family illness,Financial difficulties Health problems Marital or family conflict. Duration of problem: He reports that his anxiety and depression started three years ago when he started having problems with his lungs. He notes that he had bought a [employer] with his wife but had to go on Oxygen and was unable to do the things he needed to do. He notes that having to carry around a portable oxygen machine limits his ability to do a whole lot of things. He describes symptoms of generalized anxiety as evidenced by excessive worrying, irritability, fear of something awful happening, difficulty concentrating, difficulty sleeping, and fatigue. He describes symptoms of depression that started three years ago as evidenced by sadness, isolating himself, anger and angry outbursts, fatigue, irritability, and difficulty concentrating. He denies suicidal and homicidal thoughts.  Impact on function: He notes that his depression and anxiety affects his relationships with others.  Current or Hx of substance use: He denies abusing drugs or alcohol.  PSYCHIATRIC HISTORY - Medical conditions that might explain or contribute to symptoms: chronic pancreatitis, fluid on lungs, COPD, and problems with quitting smoking. - Hospitalizations/ Outpatient therapy:  He denies previously being in mental inpatient or outpatient treatment or hospitalized for mental illness.  -Pharmacotherapy:He has not previously been on medications for depression and anxiety. -Family  history of psychiatric issues: He reports that he believes that on both sides of the family that there are mental health issues but not that has been documented or treated for it.   OBJECTIVE:  Appearance:Fairly Groomed; Mood: Anxious ; Thought process: Coherent; Affect: Appropriate  Risk of harm to self or others: No plan to harm self or others   LIFE CONTEXT:  Family & Social:,patient lives with his mom and great nephew.  He reports that he is legally separated from his wife but they are still best friends. School/ Work: Patient went to school until the eighth grade. He is unemployed due to his health problems of COPD. He reports that he has been told to apply for disability but is having problems with motivation. Life changes: He reports that he is separated from his wife, being unable to work, difficulty quitting smoking, and his overall health declining have been the major stressors in the last three years.   GOALS ADDRESSED:  Patient will reduce symptoms of: anxiety, depression and stress; increase ability WU:[id number]:self-management skills, will also :Increase healthy adjustment to current life circumstances.  INTERVENTIONS:Screening Tool(s)  Administered, Psychoeducation and/or Health Education and Link to [facility], Copywriter, advertisingMindfulness or Relaxation Training , Reflective listening Standardized Assessments completed: PHQ 9=17,indication of: moderate depression. GAD-7=19 indication of: severe anxiety.   ISSUES DISCUSSED: Integrated care services, support system, previous and current coping skills, community resources , community support, things patient enjoy or use to enjoy doing, problems with quitting smoking, health problems, depression, and anxiety.     ASSESSMENT:  Patient currently experiencing symptoms of  Depression and anxiety.  Symptoms exacerbated by living situation, family problems, and health problems.  Patient may benefit from, and is in agreement to receive further assessment and  brief therapeutic interventions to assist with  managing symptoms.   PLAN: . Patient will F/U with LCSW [name] . LCSW will F/U with phone call NA . Behavioral recommendations: Mental health therapy at least once weekly in clinic. [name] Kitchen Referral:Integrated Behavioral Health Services (In Clinic) and [employer]:  legal aid, and provided with brochure to [employer].  . From scale of 1-10, how likely are you to follow plan: 8  Warm Hand Off Completed.

## 2017-06-28 ENCOUNTER — Encounter: Payer: Self-pay | Admitting: Internal Medicine

## 2017-06-28 ENCOUNTER — Ambulatory Visit
Admission: RE | Admit: 2017-06-28 | Discharge: 2017-06-28 | Disposition: A | Discharge status: Home / Self Care | Payer: Self-pay | Source: Ambulatory Visit | Attending: Internal Medicine | Admitting: Internal Medicine

## 2017-06-28 ENCOUNTER — Ambulatory Visit (INDEPENDENT_AMBULATORY_CARE_PROVIDER_SITE_OTHER): Payer: Self-pay | Admitting: Internal Medicine

## 2017-06-28 VITALS — BP 116/78 | HR 112 | Resp 16 | Ht 69.0 in | Wt 123.0 lb

## 2017-06-28 DIAGNOSIS — F17210 Nicotine dependence, cigarettes, uncomplicated: Secondary | ICD-10-CM

## 2017-06-28 DIAGNOSIS — R0609 Other forms of dyspnea: Principal | ICD-10-CM

## 2017-06-28 DIAGNOSIS — J90 Pleural effusion, not elsewhere classified: Secondary | ICD-10-CM | POA: Insufficient documentation

## 2017-06-28 NOTE — Progress Notes (Signed)
Austin Blevins Austin Blevins [facility] Consultation      Assessment and Plan:  57 year old male with severe, oxygen dependent emphysema, dyspnea on exertion, current smoking.  Severe emphysema.  -Showed the patient, his most recent CAT scan which showed severe bullous emphysema as well as other respiratory issues discussed the importance of quitting smoking, as well as starting on inhalers. -Continue current pro-air inhaler. The patient does not have health insurance, he is currently [facility] the [facility] I asked him to let us know if he needs any assistance with this process. I encouraged him to apply for Medicaid.  Fibrosis of the right lung.  -There are progressive fibrotic changes seen in the right lung seen on previous scans. We discussed the possibility that the this could be contributing to dyspnea. These may represent fibrosis/scarring versus cancer. -We'll continue to monitor. Repeat CT chest at 3 months, will order at next visit.  However given his severe respiratory failure, any intervention would be risky. -Patient also has a history of rock quarry work, raising the possibility of silicosis.  Chronic hypoxic  Respiratory failure.  -Secondary to above, continue oxygen.  Nicotine abuse. -Discussed the importance of nicotine cessation, spent approximately 4 minutes in discussion. He understands that his breathing will continue to decline with any continued nicotine use, if he stops now his breathing can hopefully stay where it is.  Left lung empyema-chronic. -This has been stable for approximately 2 years, and does not require intervention at this time. There is mild left mediastinal shift, likely caused by the presence of this empyema which is limiting left lung movement, and likely contributing to dyspnea. -We discussed that this is not amenable to drainage, the only definitive therapy would be thoracotomy which would not be advised, given  his severe emphysema and chronic respiratory failure.  Orders Placed This Encounter  Procedures  . DG Chest 2 View   Return in about 3 months (around 09/25/2017).   Advance care planning 06/28/17. Patient is aware that he has severe baseline respiratory failure, which is progressive in nature.  He understands that "when it is the good Lord's time to take me he will take me" I discussed with him the concept of DNR status, he is not certain whether he would want life support and aggressive interventions or not, noting that "definitely not--if I am gone, but if there is a chance, then okay yes" I encouraged him to discuss his wishes with his family.  Date: 06/28/2017  MRN# [id number] Austin Blevins Sep 20, 1960  Referring Physician:   EVERT Blevins is a 57 y.o. old male seen in consultation for chief complaint of:    Chief Complaint  Patient presents with  . COPD    denies chest tightness/pressure  . Cough    frothy  . Shortness of Breath    with any exertion  . Wheezing    Synopsis: Patient is a 57 year old male with chronic exertional dyspnea thought to be multifactorial from severe emphysema, pulmonary fibrosis, chronic empyema, and nicotine abuse at last visit he was strongly urged to quit smoking, he was started on an inhaler, and urged to try to get insurance such as Medicaid. He used to work in a [employer] for 7 years.    Subjective: Since his last visit his breathing has continued to decline. He uses 4L when active, but only uses 2 in order to conserve. He is continuing to smoke about a ppd and is trying  to cut down. He is still working on Museum/gallery curator. "I am one of the worse procrastinators in the world, it'll probably be the death of me" He is using ventolin 2 or 3 times per day and Breo once per day.    CT chest 01/20/17. There is very severe emphysematous changes/bullous changes throughout both lungs, worst in the apices. There is fibrotic changes worst at the base of the  right upper lobe, and left lower lobe posteriorly. There is thickened pleura at the left base with rim enhancement and likely chronic empyema. Empyema changes have not changed significantly since 6:30/17, however, right upper lobe and right lower lobe fibrotic changes are new.  Social Hx:   Social History   Tobacco Use  . Smoking status: Current Every Day Smoker    Packs/day: 0.25    Types: Cigarettes    Last attempt to quit: 06/17/2016    Years since quitting: 1.0  . Smokeless tobacco: Never Used  Substance Use Topics  . Alcohol use: No  . Drug use: No   Medication:    Current Outpatient Medications:  .  albuterol (PROVENTIL HFA;VENTOLIN HFA) 108 (90 Base) MCG/ACT inhaler, Inhale 2 puffs into the lungs every 4 (four) hours as needed for wheezing or shortness of breath., Disp: 1 Inhaler, Rfl: 1 .  fluticasone furoate-vilanterol (BREO ELLIPTA) 200-25 MCG/INH AEPB, Inhale 1 puff into the lungs daily., Disp: 3 each, Rfl: 3 .  rOPINIRole (REQUIP) 0.5 MG tablet, TAKE ONE TABLET BY MOUTH 3 TIMES A DAY, Disp: 90 tablet, Rfl: 0 .  traZODone (DESYREL) 100 MG tablet, Take 1 tablet (100 mg total) by mouth at bedtime., Disp: 30 tablet, Rfl: 2   Allergies:  Oxycodone  Review of Systems: Gen:  Denies  fever, sweats, chills HEENT: Denies blurred vision, double vision. bleeds, sore throat Cvc:  No dizziness, chest pain. Resp:   Denies cough or sputum production, shortness of breath Gi: Denies swallowing difficulty, stomach pain. Gu:  Denies bladder incontinence, burning urine Ext:   No Joint pain, stiffness. Skin: No skin rash,  hives  Endoc:  No polyuria, polydipsia. Psych: No depression, insomnia. Other:  All other systems were reviewed with the patient and were negative other that what is mentioned in the HPI.   Physical Examination:   VS: BP 116/78 (BP Location: Left Arm, Cuff Size: Normal)   Pulse (!) 112   Resp 16   Ht 5\' 9"  (1.753 m)   Wt 123 lb (55.8 kg)   SpO2 91%   BMI 18.16  kg/m   General Appearance: No distress  Neuro:without focal findings,  speech normal,  HEENT: PERRLA, EOM intact.   Pulmonary: normal breath sounds, Decreased air entry bilaterally. CardiovascularNormal S1,S2.  No m/r/g.   Abdomen: Benign, Soft, non-tender. Renal:  No costovertebral tenderness  GU:  No performed at this time. Endoc: No evident thyromegaly, no signs of acromegaly. Skin:   warm, no rashes, no ecchymosis  Extremities: normal, no cyanosis, clubbing.  Other findings:    LABORATORY PANEL:   CBC No results for input(s): WBC, HGB, HCT, PLT in the last 168 hours. ------------------------------------------------------------------------------------------------------------------  Chemistries  No results for input(s): NA, K, CL, CO2, GLUCOSE, BUN, CREATININE, CALCIUM, MG, AST, ALT, ALKPHOS, BILITOT in the last 168 hours.  Invalid input(s): GFRCGP ------------------------------------------------------------------------------------------------------------------  Cardiac Enzymes No results for input(s): TROPONINI in the last 168 hours. ------------------------------------------------------------  RADIOLOGY:  No results found.     Thank  you for the consultation and for allowing [facility] to assist in the care of your patient. Our recommendations are noted above.  Please contact us if we can be of further service.   Wells Guileseep Cindie Rajagopalan, MD.  Board Certified in Internal Medicine, Pulmonary Medicine, Critical Care Medicine, and Sleep Medicine.  [facility] Office Number: [phone]  Santiago Gladavid Kasa, M.D.  Billy Fischeravid Simonds, M.D  06/28/2017

## 2017-06-28 NOTE — Patient Instructions (Addendum)
Will check CXR, today, follow up in 3 months. Will call you if CXR findings require earlier followup.   --Quitting smoking is the most important thing that you can do for your health.  --Quitting smoking will have greater affect on your health than any medicine that we can give you.

## 2017-06-30 ENCOUNTER — Other Ambulatory Visit: Payer: Self-pay | Admitting: Internal Medicine

## 2017-06-30 DIAGNOSIS — R911 Solitary pulmonary nodule: Secondary | ICD-10-CM

## 2017-07-01 ENCOUNTER — Ambulatory Visit: Payer: Self-pay | Admitting: Licensed Clinical Social Worker

## 2017-07-01 DIAGNOSIS — F331 Major depressive disorder, recurrent, moderate: Secondary | ICD-10-CM

## 2017-07-01 NOTE — Progress Notes (Signed)
Subjective:  Patient ID: Austin Blevins, male   DOB: [birth year], 57 y.o.   MRN: [id number]    Increase emotional regulation and Increase skills for wellness and recovery  [name]  presents with depression. Onset of symptoms was several years with unchanged unchanged. Symptoms have been occurringNot influenced by the time of the day.  Symptoms are currently rated marked. Associated signs and symptoms include: poor social interaction and sadnessHealth problems Marital or family conflict.   [name] reports that he hasn't been doing so well. He reports that he saw his lung doctor who told him that if he doesn't quit smoking that he will only have a year to five years to live. He reports that his doctor told him to get his affairs in order and discuss his wishes with his family. He notes that he has been feeling down and depressed since his doctor told him this. He notes that he wants to quit smoking but has problems with procrastination. He asked if its necessary that he have a power of attorney now with what his doctor told him. He asked about what he needed to do in regards to disability. He explains that he doesn't know how to deal with stress and where to go from here. 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 He denies suicidal and homicidal thoughts.   Therapeutic Interventions: Cognitive Behavioral therapy and Supportive therapy was utilized by the clinician during today's session. Clinician processed with the patient regarding how he has been doing since the last session. Clinician processed with the patient regarding that she thinks that his lung doctor cares about him and is trying to motivate him to make necessary changes towards bettering his health. Clinician explained that the doctor was trying to give him a reality check of what will happen without making the necessary changes that he needs to make.  Clinician discussed with the patient regarding the barriers that prevent him from moving forward. Clinician explained that she thinks he also has a problem with motivation; having the force and drive to accomplish goals. Clinician suggested that the patient make a list of things to accomplish each week and work on accomplishing one item a day. Clinician suggested that the patient put on his first list: call the 1 800 QUIT Line, call Legal Aid, contact or walk in at [facility] to schedule an appointment to see a psychiatrist, and cut down by one cigarette each week. Clinician explained that visually seeing a list and checking off a box when an item is completed can help patients feel that they have accomplished something. Clinician explained that in terms of stress that he has to figure out a way to manage and cope with it. Clinician asked the patient what is something he enjoys doing. Clinician explained that he should contact [employer] in regards to establishing with an attorney to apply for disability and discuss the necessity of a power of attorney.   Return visit in 1 week.    Effectiveness:  New problem, with additional work-up planned (4). Progressing It is felt more time is needed for Interventions to work.  . Patient is fully  Other:  orientated to time and place. Patient's Appropriate into problems. Active. Thought process is  Coherent.Minimal: No identifiable suicidal ideation.  Patients presenting with no risk factors but with morbid ruminations; may be classified as minimal risk based on the severity  of the depressive symptoms and None.   Homework: Make a to do list with items to accomplish this week and place on fridge or in wallet. Handout with contact information for [facility] for medication management was provided to the patient. Cut down by one cigarette each week towards his goal of quitting smoking.   Plan: Follow up with Carey BullocksHeather Simpson, LCSW at [facility] in one week.

## 2017-07-08 ENCOUNTER — Ambulatory Visit: Payer: Self-pay | Admitting: Licensed Clinical Social Worker

## 2017-07-09 ENCOUNTER — Telehealth: Payer: Self-pay

## 2017-07-09 NOTE — Telephone Encounter (Signed)
LM to call back to r/s appt w Austin Blevins

## 2017-07-14 ENCOUNTER — Telehealth: Payer: Self-pay | Admitting: Licensed Clinical Social Worker

## 2017-07-14 NOTE — Telephone Encounter (Signed)
Clinician reached out to the patient about missing his last appointment on February 21st at 3:30pm. She rescheduled the patient for February 28th at 3:00pm. She changed the appointment note information on February 21st so that he is not charged a no fee and apologized for the inconvenience.   Patient informed the clinician that he called to cancel but was told to call back to speak with Herbert SetaHeather to reschedule the appointment.

## 2017-07-15 ENCOUNTER — Ambulatory Visit: Payer: Self-pay | Admitting: Licensed Clinical Social Worker

## 2017-07-15 DIAGNOSIS — F331 Major depressive disorder, recurrent, moderate: Secondary | ICD-10-CM

## 2017-07-15 DIAGNOSIS — F411 Generalized anxiety disorder: Secondary | ICD-10-CM

## 2017-07-15 NOTE — Progress Notes (Signed)
Subjective:  Patient ID: Austin Blevins, male   DOB: Sep 28, 1960, 57 y.o.   MRN: [id number]    Engage patient in aftercare planning with referrals and resources and Increase emotional regulation  Austin Blevins presents with depression. Onset of symptoms was several years with gradually improving improving. Symptoms have been occurringNot influenced by the time of the day.  Symptoms are currently rated marked. Associated signs and symptoms include: poor social interaction, sadness and withdrawnHealth problems Marital or family conflict.   He reports that he cancelled last week's appointment because he felt like he did not accomplish enough on his to do list. He notes that since then he has set a date to quit smoking on March 6th, called 1-800 QUIT NOW, and is waiting for his nicotine patches to come in. He notes that he made an appointment with [facility], and has an appointment to see the psychiatrist on the 27th. He notes that he still feels down and depressed from time to time. He explains that he has problems with staying negative and lack of motivation. He denies suicidal and homicidal thoughts.  Therapeutic Interventions: Cognitive Behavioral therapy was utilized by the clinician during today's session focused on patient's symptoms of depression and behavioral activation. Clinician processed with the patient regarding how he has been doing since their last session. Clinician congratulated the patient on accomplishing two of the items on his to do list. Clinician explained that regardless if he completes his homework or not that he can still come in to vent about things. Clinician processed with the patient regarding how his overall mood has been. Clinician encouraged the patient to take things one day at a time, focus on what he does have versus what he does not have.   Return visit in 1 week.    Effectiveness:  Established problem, stable/improving (1). Progressing It is felt more time is needed for  Interventions to work.  . Patient is fully  Other:  orientated to time and place. Patient's Appropriate into problems. Active. Thought process is  Coherent.Minimal: No identifiable suicidal ideation.  Patients presenting with no risk factors but with morbid ruminations; may be classified as minimal risk based on the severity of the depressive symptoms and None.   Homework: Continue to work on items on to do list.   Plan: Follow up with Austin BullocksHeather Beverlie Kurihara, LCSW at [facility] in one week.

## 2017-07-20 ENCOUNTER — Ambulatory Visit: Payer: Self-pay | Admitting: Licensed Clinical Social Worker

## 2017-07-20 DIAGNOSIS — F331 Major depressive disorder, recurrent, moderate: Secondary | ICD-10-CM

## 2017-07-20 DIAGNOSIS — F411 Generalized anxiety disorder: Secondary | ICD-10-CM

## 2017-07-20 NOTE — Progress Notes (Signed)
Subjective:  Patient ID: Austin Blevins, male   DOB: Sep 27, 1960, 57 y.o.   MRN: [id number]    Increase ability to appropriately verbalize feelings and Increase skills for wellness and recovery  [name] presents with depression. Onset of symptoms was few years with gradually improving improving. Symptoms have been occurringNot influenced by the time of the day.  Symptoms are currently rated marked. Associated signs and symptoms include: highly negative, sadness and anxiousHealth problems Marital or family conflict.   [name] reports that he has been doing okay. He describes feeling anxious due to his family planning to meet to discuss power of attorney and to sign papers. He notes that he originally told his family the response he received from the lung doctor and for awhile considered giving up. He explains that he is trying to do whatever else wants and make them happy. He reports that he is down to only smoking two cigarettes a day and has yet to smoke a cigarette today. He denies suicidal and homicidal thoughts.   Therapeutic Interventions: Cognitive Behavioral therapy was utilized by the clinician during today's session focusing on depression and anxiety. Clinician utilized reflective listening to discuss the patient's concerns and anxious feelings over what his family wants versus what he wants. Clinician suggested that the patient think about what are in his best interests and share his feelings with his family. Clinician processed with the patient regarding that he has obviously not given up or what not make an effort to make his doctor, therapy, and other appointments to make changes in his life. Clinician congratulated the patient on his progress towards quitting smoking and encouraged him to continue moving forward with the previous plan discussed of decreasing the amount of cigarettes per week. Clinician suggested that the patient bring in the paperwork for power of attorney so she can over  it with him and help him towards making a decision that he wants to make.   Return visit in 1 week.    Effectiveness:  Established problem, stable/improving (1). Progressing It is felt more time is needed for Interventions to work.  . Patient is fully or not fully Other:  orientated to time and place. Patient's Appropriate into problems. Active. Thought process is  Coherent.Minimal: No identifiable suicidal ideation.  Patients presenting with no risk factors but with morbid ruminations; may be classified as minimal risk based on the severity of the depressive symptoms and None.   Homework: Find attorney prior to applying for disability  Plan: Follow up with Carey BullocksHeather Denee Boeder, LCSW at [facility] in one week.

## 2017-07-27 ENCOUNTER — Ambulatory Visit: Payer: Self-pay | Admitting: Licensed Clinical Social Worker

## 2017-07-27 DIAGNOSIS — F411 Generalized anxiety disorder: Secondary | ICD-10-CM

## 2017-07-27 DIAGNOSIS — F331 Major depressive disorder, recurrent, moderate: Secondary | ICD-10-CM

## 2017-07-28 NOTE — Progress Notes (Signed)
Subjective:  Patient ID: Austin Blevins, male   DOB: Jul 26, 1960, 57 y.o.   MRN: [id number]    Increase emotional regulation  Austin Blevins  presents with depression. Onset of symptoms was last two years with gradually improving improving. Symptoms have been occurringNot influenced by the time of the day.  Symptoms are currently rated marked. Associated signs and symptoms include: attention difficulties, highly negative, poor social interaction and sadnessHealth problems.   He reports that he has been having a difficult time since he quit smoking. He notes that he has have been having to use his oxygen tank a lot more frequently. He notes that he apparently already signed a power of attorney last week at the lawyer's office and made a living will. He reports that he has a disability interview over the phone on the 19th of this month. He notes that he feels a little better having his affairs in order but there were a few times this weekend that he thought it was over. He denies suicidal and homicidal thoughts.  Therapeutic Interventions: Cognitive Behavioral therapy was utilized by the clinician during today's session. Clinician processed with the patient regarding how he has been doing since the last session. Clinician processed with the patient regarding that she is proud of him for quitting smoking and realizes that it must have been very difficult for him. Clinician asked the patient how the family meeting went in regards to the power of attorney. Clinician processed with the patient regarding that with his health that it was smart to make a living will and testament. Clinician explained that his sister has a point that the doctor under oath cannot tell him he has an expiration date. Clinician processed with the patient regarding that she is glad that he is continuing his to do list and is not giving up on life. Clinician processed with the patient regarding he was told a year to five years so he still has  time to get things in order. Clinician encouraged the patient to think positively instead of negatively about things.   Return visit in 1 week.    Effectiveness:Established problem, stable/improving (1). Progressing It is felt more time is needed for Interventions to work.  . Patient is fully  Other:  orientated to time and place. Patient's Appropriate into problems. Active. Thought process is  Coherent.Minimal: No identifiable suicidal ideation.  Patients presenting with no risk factors but with morbid ruminations; may be classified as minimal risk based on the severity of the depressive symptoms and None.   Homework: Continue to do list, stay positive, and practice self care  Plan: Follow up with Carey BullocksHeather Charity Tessier, LCSW at [facility] in one week.

## 2017-08-05 ENCOUNTER — Ambulatory Visit: Payer: Self-pay | Admitting: Licensed Clinical Social Worker

## 2017-08-05 ENCOUNTER — Ambulatory Visit: Payer: Self-pay

## 2017-08-09 ENCOUNTER — Other Ambulatory Visit: Payer: Self-pay | Admitting: Internal Medicine

## 2017-08-09 DIAGNOSIS — G20 Parkinson's disease: Secondary | ICD-10-CM

## 2017-08-09 NOTE — Telephone Encounter (Signed)
I have reached out to Dr. Sherie DonLada about the Trazodone RX she did not realize it was not on the formulary bc she only uses it for sleep for patients.  It is up for reorder and we are going to see what she wants to replace it with from the formulary.  I will get back with everyone as soon as I can.  Stein Windhorst

## 2017-08-12 ENCOUNTER — Ambulatory Visit: Payer: Self-pay | Admitting: Adult Health

## 2017-08-12 ENCOUNTER — Ambulatory Visit: Payer: Self-pay | Admitting: Licensed Clinical Social Worker

## 2017-08-12 ENCOUNTER — Encounter: Payer: Self-pay | Admitting: Adult Health

## 2017-08-12 VITALS — BP 107/80 | HR 126 | Temp 97.4°F | Ht 68.0 in | Wt 119.9 lb

## 2017-08-12 DIAGNOSIS — J410 Simple chronic bronchitis: Secondary | ICD-10-CM

## 2017-08-12 DIAGNOSIS — I471 Supraventricular tachycardia: Secondary | ICD-10-CM

## 2017-08-12 DIAGNOSIS — F331 Major depressive disorder, recurrent, moderate: Secondary | ICD-10-CM

## 2017-08-12 DIAGNOSIS — Z716 Tobacco abuse counseling: Secondary | ICD-10-CM

## 2017-08-12 DIAGNOSIS — E43 Unspecified severe protein-calorie malnutrition: Secondary | ICD-10-CM

## 2017-08-12 DIAGNOSIS — F17209 Nicotine dependence, unspecified, with unspecified nicotine-induced disorders: Secondary | ICD-10-CM

## 2017-08-12 DIAGNOSIS — J449 Chronic obstructive pulmonary disease, unspecified: Secondary | ICD-10-CM

## 2017-08-12 MED ORDER — BENZONATATE 100 MG PO CAPS: 100.0000 mg | ORAL_CAPSULE | Freq: Three times a day (TID) | ORAL | 2 refills | Status: DC | PRN

## 2017-08-12 MED ORDER — ALBUTEROL SULFATE HFA 108 (90 BASE) MCG/ACT IN AERS
2.0000 | INHALATION_SPRAY | RESPIRATORY_TRACT | 3 refills | Status: DC | PRN
Start: 2017-08-12 — End: 2017-10-12

## 2017-08-12 MED ORDER — FLUTICASONE FUROATE-VILANTEROL 200-25 MCG/INH IN AEPB: 1.0000 | INHALATION_SPRAY | Freq: Every day | RESPIRATORY_TRACT | 3 refills | Status: DC

## 2017-08-12 MED ORDER — TIOTROPIUM BROMIDE MONOHYDRATE 18 MCG IN CAPS: 18.0000 ug | ORAL_CAPSULE | Freq: Every day | RESPIRATORY_TRACT | 3 refills | Status: DC

## 2017-08-12 MED ORDER — ROPINIROLE HCL 0.5 MG PO TABS: ORAL_TABLET | ORAL | 3 refills | Status: DC

## 2017-08-12 MED ORDER — GUAIFENESIN-DM 100-10 MG/5ML PO SYRP: 10.0000 mL | ORAL_SOLUTION | ORAL | 0 refills | Status: DC | PRN

## 2017-08-12 MED ORDER — DOCUSATE SODIUM 100 MG PO CAPS: 200.0000 mg | ORAL_CAPSULE | Freq: Every day | ORAL | 2 refills | Status: DC

## 2017-08-12 MED ORDER — ENSURE HIGH PROTEIN PO LIQD: 1.0000 | Freq: Three times a day (TID) | ORAL | 3 refills | Status: DC

## 2017-08-12 NOTE — Progress Notes (Signed)
Subjective:  Patient ID: Austin Blevins, male   DOB: March 11, 1961, 57 y.o.   MRN: [id number]    Increase emotional regulation  [name]  presents with depression. Onset of symptoms was last two years with gradually improving improving. Symptoms have been occurringNot influenced by the time of the day.  Symptoms are currently rated marked. Associated signs and symptoms include: highly negative, poor social interaction, sadness and stubbornFinancial difficulties Health problems.   He reports that he has been feeling apathetic this week. He notes that he forgot about the appointment last week which isn't like him. He notes that he went a week without smoking and started smoking a half a pack a day again. He reports that he has just been feeling negative, irritable, and accepting his fate. He notes that he is not sure what changed and realizes that he needs to be more positive but its hard when the clock is ticking. He denies suicidal and homicidal thoughts.   Therapeutic Interventions: Cognitive Behavioral therapy was utilized by the patient focusing on symptoms of depression and utilizing coping skills. Clinician processed with the patient regarding how he has been doing since the last session. Clinician processed with the patient regarding missing his appointment. Clinician checked in with the patient regarding the original to do list that they created in the last month and his progress. Clinician explained that he was positive and hopeful the last time she saw him. Clinician asked what has changed to cause his mood shift. Clinician suggested that the patient focus on gratitude, his loved ones, and suggested making a bucket list.   Return visit in 1 week.    Effectiveness:  Established problem, stable/improving (1). Progressing It is felt more time is needed for Interventions to work.  . Patient is fully or not fully Other:  orientated to time and place. Patient's Appropriate into problems. Active.  Thought process is  Coherent.Minimal: No identifiable suicidal ideation.  Patients presenting with no risk factors but with morbid ruminations; may be classified as minimal risk based on the severity of the depressive symptoms and None.   Homework: Focus on positive, gratitude, and creating a bucket list.  Plan: Follow up with [name], LCSW at [facility] in one week or earlier.

## 2017-08-12 NOTE — Patient Instructions (Addendum)
 Chronic Obstructive Pulmonary Disease Chronic obstructive pulmonary disease (COPD) is a long-term (chronic) lung problem. When you have COPD, it is hard for air to get in and out of your lungs. The way your lungs work will never return to normal. Usually the condition gets worse over time. There are things you can do to keep yourself as healthy as possible. Your doctor may treat your condition with:  Medicines.  Quitting smoking, if you smoke.  Rehabilitation. This may involve a team of specialists.  Oxygen.  Exercise and changes to your diet.  Lung surgery.  Comfort measures (palliative care).  Follow these instructions at home: Medicines  Take over-the-counter and prescription medicines only as told by your doctor.  Talk to your doctor before taking any cough or allergy medicines. You may need to avoid medicines that cause your lungs to be dry. Lifestyle  If you smoke, stop. Smoking makes the problem worse. If you need help quitting, ask your doctor.  Avoid being around things that make your breathing worse. This may include smoke, chemicals, and fumes.  Stay active, but remember to also rest.  Learn and use tips on how to relax.  Make sure you get enough sleep. Most adults need at least 7 hours a night.  Eat healthy foods. Eat smaller meals more often. Rest before meals. Controlled breathing  Learn and use tips on how to control your breathing as told by your doctor. Try: ? Breathing in (inhaling) through your nose for 1 second. Then, pucker your lips and breath out (exhale) through your lips for 2 seconds. ? Putting one hand on your belly (abdomen). Breathe in slowly through your nose for 1 second. Your hand on your belly should move out. Pucker your lips and breathe out slowly through your lips. Your hand on your belly should move in as you breathe out. Controlled coughing  Learn and use controlled coughing to clear mucus from your lungs. The steps are: 1. Lean  your head a little forward. 2. Breathe in deeply. 3. Try to hold your breath for 3 seconds. 4. Keep your mouth slightly open while coughing 2 times. 5. Spit any mucus out into a tissue. 6. Rest and do the steps again 1 or 2 times as needed. General instructions  Make sure you get all the shots (vaccines) that your doctor recommends. Ask your doctor about a flu shot and a pneumonia shot.  Use oxygen therapy and therapy to help improve your lungs (pulmonary rehabilitation) if told by your doctor. If you need home oxygen therapy, ask your doctor if you should buy a tool to measure your oxygen level (oximeter).  Make a COPD action plan with your doctor. This helps you know what to do if you feel worse than usual.  Manage any other conditions you have as told by your doctor.  Avoid going outside when it is very hot, cold, or humid.  Avoid people who have a sickness you can catch (contagious).  Keep all follow-up visits as told by your doctor. This is important. Contact a doctor if:  You cough up more mucus than usual.  There is a change in the color or thickness of the mucus.  It is harder to breathe than usual.  Your breathing is faster than usual.  You have trouble sleeping.  You need to use your medicines more often than usual.  You have trouble doing your normal activities such as getting dressed or walking around the house. Get help right away   You have shortness of breath while resting.  You have shortness of breath that stops you from: ? Being able to talk. ? Doing normal activities.  Your chest hurts for longer than 5 minutes.  Your skin color is more blue than usual.  Your pulse oximeter shows that you have low oxygen for longer than 5 minutes.  You have a fever.  You feel too tired to breathe normally. Summary  Chronic obstructive pulmonary disease (COPD) is a long-term lung problem.  The way your lungs work will never return to normal. Usually the  condition gets worse over time. There are things you can do to keep yourself as healthy as possible.  Take over-the-counter and prescription medicines only as told by your doctor.  If you smoke, stop. Smoking makes the problem worse. This information is not intended to replace advice given to you by your health care provider. Make sure you discuss any questions you have with your health care provider. Document Released: 10/21/2007 Document Revised: 10/10/2015 Document Reviewed: 12/29/2012 Elsevier Interactive Patient Education  2017 Elsevier Inc.  Coping with Quitting Smoking Quitting smoking is a physical and mental challenge. You will face cravings, withdrawal symptoms, and temptation. Before quitting, work with your health care provider to make a plan that can help you cope. Preparation can help you quit and keep you from giving in. How can I cope with cravings? Cravings usually last for 5-10 minutes. If you get through it, the craving will pass. Consider taking the following actions to help you cope with cravings:  Keep your mouth busy: ? Chew sugar-free gum. ? Suck on hard candies or a straw. ? Brush your teeth.  Keep your hands and body busy: ? Immediately change to a different activity when you feel a craving. ? Squeeze or play with a ball. ? Do an activity or a hobby, like making bead jewelry, practicing needlepoint, or working with wood. ? Mix up your normal routine. ? Take a short exercise break. Go for a quick walk or run up and down stairs. ? Spend time in public places where smoking is not allowed.  Focus on doing something kind or helpful for someone else.  Call a friend or family member to talk during a craving.  Join a support group.  Call a quit line, such as 1-800-QUIT-NOW.  Talk with your health care provider about medicines that might help you cope with cravings and make quitting easier for you.  How can I deal with withdrawal symptoms? Your body may  experience negative effects as it tries to get used to not having nicotine in the system. These effects are called withdrawal symptoms. They may include:  Feeling hungrier than normal.  Trouble concentrating.  Irritability.  Trouble sleeping.  Feeling depressed.  Restlessness and agitation.  Craving a cigarette.  To manage withdrawal symptoms:  Avoid places, people, and activities that trigger your cravings.  Remember why you want to quit.  Get plenty of sleep.  Avoid coffee and other caffeinated drinks. These may worsen some of your symptoms.  How can I handle social situations? Social situations can be difficult when you are quitting smoking, especially in the first few weeks. To manage this, you can:  Avoid parties, bars, and other social situations where people might be smoking.  Avoid alcohol.  Leave right away if you have the urge to smoke.  Explain to your family and friends that you are quitting smoking. Ask for understanding and support.  Plan activities with friends   or family where smoking is not an option.  What are some ways I can cope with stress? Wanting to smoke may cause stress, and stress can make you want to smoke. Find ways to manage your stress. Relaxation techniques can help. For example:  Breathe slowly and deeply, in through your nose and out through your mouth.  Listen to soothing, relaxing music.  Talk with a family member or friend about your stress.  Light a candle.  Soak in a bath or take a shower.  Think about a peaceful place.  What are some ways I can prevent weight gain? Be aware that many people gain weight after they quit smoking. However, not everyone does. To keep from gaining weight, have a plan in place before you quit and stick to the plan after you quit. Your plan should include:  Having healthy snacks. When you have a craving, it may help to: ? Eat plain popcorn, crunchy carrots, celery, or other cut  vegetables. ? Chew sugar-free gum.  Changing how you eat: ? Eat small portion sizes at meals. ? Eat 4-6 small meals throughout the day instead of 1-2 large meals a day. ? Be mindful when you eat. Do not watch television or do other things that might distract you as you eat.  Exercising regularly: ? Make time to exercise each day. If you do not have time for a long workout, do short bouts of exercise for 5-10 minutes several times a day. ? Do some form of strengthening exercise, like weight lifting, and some form of aerobic exercise, like running or swimming.  Drinking plenty of water or other low-calorie or no-calorie drinks. Drink 6-8 glasses of water daily, or as much as instructed by your health care provider.  Summary  Quitting smoking is a physical and mental challenge. You will face cravings, withdrawal symptoms, and temptation to smoke again. Preparation can help you as you go through these challenges.  You can cope with cravings by keeping your mouth busy (such as by chewing gum), keeping your body and hands busy, and making calls to family, friends, or a helpline for people who want to quit smoking.  You can cope with withdrawal symptoms by avoiding places where people smoke, avoiding drinks with caffeine, and getting plenty of rest.  Ask your health care provider about the different ways to prevent weight gain, avoid stress, and handle social situations. This information is not intended to replace advice given to you by your health care provider. Make sure you discuss any questions you have with your health care provider. Document Released: 05/01/2016 Document Revised: 05/01/2016 Document Reviewed: 05/01/2016 Elsevier Interactive Patient Education  2018 Elsevier Inc.  

## 2017-08-12 NOTE — Progress Notes (Signed)
Patient: Austin Blevins Male    DOB: January 15, 1961   57 y.o.   MRN: [id number] Visit Date: 08/12/2017  Today's Provider: Mary Sella, NP   Chief Complaint  Patient presents with  . Follow-up  COPD and malnutrition Subjective:    HPI 57 y/o male presenting for follow-up COPD. He reports dyspnea with any activity. He is on oxygen at 4L [place] at home. His SPO2 is 88-92 % when at rest and drops to 80 with oxygen on or 70s without oxygen. Denies fever, nausea, vomiting, but reports a productive cough with yellow sputum and orthopnea. He continues to smoke and smokes about 1/2 pack/day. His oral intake remains poor. He states that he has no strength to chew food. His weight continues to decline. He has lost 3 pounds since his last visit.   Allergies  Allergen Reactions  . Oxycodone     Other reaction(s): Syncope   Previous Medications   ALBUTEROL (PROVENTIL HFA;VENTOLIN HFA) 108 (90 BASE) MCG/ACT INHALER    Inhale 2 puffs into the lungs every 4 (four) hours as needed for wheezing or shortness of breath.   FLUTICASONE FUROATE-VILANTEROL (BREO ELLIPTA) 200-25 MCG/INH AEPB    Inhale 1 puff into the lungs daily.   ROPINIROLE (REQUIP) 0.5 MG TABLET    TAKE ONE TABLET BY MOUTH 3 TIMES A DAY   TRAZODONE (DESYREL) 100 MG TABLET    Take 1 tablet (100 mg total) by mouth at bedtime.    Review of Systems  Constitutional: Positive for appetite change and fatigue.  HENT: Positive for congestion. Negative for sinus pain and sneezing.   Respiratory: Positive for cough, shortness of breath and wheezing. Negative for choking.   Cardiovascular: Positive for palpitations. Negative for chest pain and leg swelling.  Gastrointestinal: Negative for constipation and diarrhea.  Endocrine: Negative for polydipsia, polyphagia and polyuria.  Musculoskeletal: Negative for arthralgias, back pain and gait problem.  Skin: Negative for pallor and rash.  Neurological: Negative for dizziness, seizures, syncope and numbness.   Psychiatric/Behavioral: Positive for dysphoric mood (expressed frustration at inablility to breath).    Social History   Tobacco Use  . Smoking status: Current Every Day Smoker    Packs/day: 0.25    Types: Cigarettes    Last attempt to quit: 06/17/2016    Years since quitting: 1.1  . Smokeless tobacco: Never Used  Substance Use Topics  . Alcohol use: No   Objective:   BP 107/80   Pulse (!) 126   Temp (!) 97.4 F (36.3 C)   Ht '5\' 8"'  (1.727 m)   Wt 119 lb 14.4 oz (54.4 kg)   BMI 18.23 kg/m   Physical Exam  Constitutional: He is oriented to person, place, and time. He appears distressed.  Appears cachetic, in mild respiratory distress; dyspneic with minimal exertion  Eyes: Pupils are equal, round, and reactive to light. Conjunctivae are normal.  Neck: Normal range of motion. Neck supple.  Cardiovascular: Regular rhythm, normal heart sounds and intact distal pulses.  Sinus tachycardia 126bpm initially; now down to 100bpm, regular  Pulmonary/Chest: He is in respiratory distress. He has wheezes (mild expiratory wheezes, short expiratory span, Breath sounds diminished in all lung fields). He has no rales.  Abdominal: Soft. Bowel sounds are normal.  Musculoskeletal: Normal range of motion.  Neurological: He is alert and oriented to person, place, and time.  Skin: Skin is warm and dry.  Poor turgor  Psychiatric:  Mood is depressed      Assessment &  Plan:  1. Smokers' cough ([place]) -Smoking cessation encouraged. Patient given the 1800 number  2. Severe malnutrition (HCC) -Weight declining and oral intake poor. Will obtain routine labs to r/o any underlying conditions and start protein supplements -High Protein Ensure 1 can TID with meals - CBC - Comp Met (CMET) - Prealbumin - Magnesium - Phosphorus - Lipid Profile - HgB A1c - TSH  3. Paroxysmal supraventricular tachycardia (HCC) -Likely related to malnutrition and electrolyte abnormalities; will obtain routine  labs -Patient instructed to monitor HR and go to the ED if >150  4. Depression, major, recurrent, moderate ([place]) -continue Trazodone  and f/u with counselor  5. Chronic obstructive pulmonary disease, unspecified COPD type ([place]) -Continue Breo-Ellipta 1 puff daily and prn albuterol -Start spiriva 1 capsule inhaled daily -Continue home O2 4L [place] and monitor SPO2  -Expectorants and cough suppressants -Call and RTC if worsening symptoms  Mary Sella, NP   [facility] [place]

## 2017-08-13 LAB — PREALBUMIN: PREALBUMIN: 15 mg/dL (ref 10–36)

## 2017-08-13 LAB — COMPREHENSIVE METABOLIC PANEL
A/G RATIO: 1.1 — AB (ref 1.2–2.2)
ALT: 11 IU/L (ref 0–44)
AST: 12 IU/L (ref 0–40)
Albumin: 3.8 g/dL (ref 3.5–5.5)
Alkaline Phosphatase: 103 IU/L (ref 39–117)
BILIRUBIN TOTAL: 0.4 mg/dL (ref 0.0–1.2)
BUN / CREAT RATIO: 14 (ref 9–20)
BUN: 15 mg/dL (ref 6–24)
CHLORIDE: 102 mmol/L (ref 96–106)
CO2: 27 mmol/L (ref 20–29)
Calcium: 10 mg/dL (ref 8.7–10.2)
Creatinine, Ser: 1.08 mg/dL (ref 0.76–1.27)
GFR calc non Af Amer: 77 mL/min/{1.73_m2} (ref >59)
GFR, EST AFRICAN AMERICAN: 89 mL/min/{1.73_m2} (ref >59)
Globulin, Total: 3.5 g/dL (ref 1.5–4.5)
Glucose: 91 mg/dL (ref 65–99)
POTASSIUM: 5.9 mmol/L — AB (ref 3.5–5.2)
Sodium: 145 mmol/L — ABNORMAL HIGH (ref 134–144)
TOTAL PROTEIN: 7.3 g/dL (ref 6.0–8.5)

## 2017-08-13 LAB — HEMOGLOBIN A1C
Est. average glucose Bld gHb Est-mCnc: 128 mg/dL
HEMOGLOBIN A1C: 6.1 % — AB (ref 4.8–5.6)

## 2017-08-13 LAB — CBC
Hematocrit: 42.5 % (ref 37.5–51.0)
Hemoglobin: 14.5 g/dL (ref 13.0–17.7)
MCH: 30.3 pg (ref 26.6–33.0)
MCHC: 34.1 g/dL (ref 31.5–35.7)
MCV: 89 fL (ref 79–97)
PLATELETS: 376 10*3/uL (ref 150–379)
RBC: 4.79 x10E6/uL (ref 4.14–5.80)
RDW: 13.2 % (ref 12.3–15.4)
WBC: 7 10*3/uL (ref 3.4–10.8)

## 2017-08-13 LAB — PHOSPHORUS: Phosphorus: 3.5 mg/dL (ref 2.5–4.5)

## 2017-08-13 LAB — LIPID PANEL
CHOL/HDL RATIO: 5.3 ratio — AB (ref 0.0–5.0)
Cholesterol, Total: 168 mg/dL (ref 100–199)
HDL: 32 mg/dL — ABNORMAL LOW (ref >39)
LDL CALC: 118 mg/dL — AB (ref 0–99)
Triglycerides: 90 mg/dL (ref 0–149)
VLDL CHOLESTEROL CAL: 18 mg/dL (ref 5–40)

## 2017-08-13 LAB — MAGNESIUM: Magnesium: 2 mg/dL (ref 1.6–2.3)

## 2017-08-16 ENCOUNTER — Telehealth: Payer: Self-pay | Admitting: Pharmacist

## 2017-08-16 NOTE — Telephone Encounter (Signed)
08/16/2017 8:54:04 AM - Ventolin HFA refill  08/16/17 Placed refill online with [facility] for Ventolin HFA, to release 09/17/17, order# [id number].Forde RadonAJ

## 2017-08-17 ENCOUNTER — Ambulatory Visit: Payer: Self-pay | Admitting: Licensed Clinical Social Worker

## 2017-08-17 DIAGNOSIS — F331 Major depressive disorder, recurrent, moderate: Secondary | ICD-10-CM

## 2017-08-17 NOTE — Progress Notes (Signed)
Subjective:  Patient ID: Austin Blevins, male   DOB: [birth year], 57 y.o.   MRN: [id number]    Increase emotional regulation  [name]  presents with depression. Onset of symptoms was several years with gradually improving improving. Symptoms have been occurringNot influenced by the time of the day.  Symptoms are currently rated marked. Associated signs and symptoms include: attention difficulties, highly negative, poor social interaction and sadnessFinancial difficulties Health problems Marital or family conflict.   He reports that nothing has changed this week and nothing new has happened. He notes that everything is accomplished on his to do list except for smoking. He reports that he thinks the barrier to quitting smoking is that he is inwardly negative about things. He notes that he easily quit smoking in the past but can't seem to find the motivation. He notes that he likes smoking and it helps him to de-stress from everything. He reports that he forgot to complete the bucket list and has thought of a few things but has yet to put them into action. He notes that he feels stuck sometimes and isn't sure how to move forward. He denies suicidal and homicidal thoughts.   Therapeutic Interventions: Cognitive Behavioral therapy was utilized focusing on symptoms of depression and effect on normal cognition. Clinician processed with the patient regarding how he has been doing since the last session. Clinician processed with the patient regarding how his overall mood is this week compared to last week. Clinician congratulated the patient on accomplishing everything on his to do list but one thing. Clinician discussed with the patient, the barrier to quitting smoking. Clinician asked the patient to think about the pros and cons of quitting. Clinician processed with the patient regarding if he completed the assignment of making a bucket list at the last session. Clinician suggested that the patient focus on the  positive and things for enjoyment.   Return visit in 1 week.    Effectiveness:  Established problem, stable/improving (1). Progressing It is felt more time is needed for Interventions to work.  . Patient is fully Other:  orientated to time and place. Patient's Appropriate into problems. Active. Thought process is  Coherent.Minimal: No identifiable suicidal ideation.  Patients presenting with no risk factors but with morbid ruminations; may be classified as minimal risk based on the severity of the depressive symptoms and None.   Homework: Focus on the positive and things for enjoyment.   Plan: Follow up with Carey BullocksHeather Simpson, LCSW at [facility] in one week or earlier.

## 2017-08-19 NOTE — Addendum Note (Signed)
Addended by: Garnette CzechUKOV, Mylissa Lambe S on: 08/19/2017 04:06 PM   Modules accepted: Orders

## 2017-08-24 ENCOUNTER — Ambulatory Visit: Payer: Self-pay | Admitting: Licensed Clinical Social Worker

## 2017-08-31 ENCOUNTER — Other Ambulatory Visit: Payer: Self-pay

## 2017-08-31 ENCOUNTER — Ambulatory Visit: Payer: Self-pay | Admitting: Licensed Clinical Social Worker

## 2017-08-31 DIAGNOSIS — I471 Supraventricular tachycardia: Secondary | ICD-10-CM

## 2017-08-31 DIAGNOSIS — F331 Major depressive disorder, recurrent, moderate: Secondary | ICD-10-CM

## 2017-08-31 NOTE — Progress Notes (Signed)
Subjective:  Patient ID: Austin Blevins, male   DOB: February 18, 1961, 57 y.o.   MRN: [id number]    Engage patient in aftercare planning with referrals and resources and Increase emotional regulation  [name]  presents with depression. Onset of symptoms was last few years with gradually improving improving. Symptoms have been occurringNot influenced by the time of the day.  Symptoms are currently rated moderate. Associated signs and symptoms include: attention difficulties, highly negative and poor social interactionHealth problems.   He reports that he has been doing okay since the last session. He explains that he has two appointments for disability and things seemed to go smoothly. He notes that he has been enjoying the weather and did get out of the house this past weekend to attend a fishing tournament with his brother. He notes that during the week that unless he has appointments or needs to run errands that he doesn't leave the house too much. He reports that he does visit with his ex wife at least once a day to check in on her and her family. He notes that sometimes it's hard to not think negatively about things in terms of his health, lungs, and overall situation. He denies suicidal and homicidal thoughts.   Therapeutic Interventions: Cognitive Behavioral therapy and supportive therapy was utilized by the clinician focusing on patient's depression exploring positive thoughts rather than negative to improve mood. Clinician processed with the patient regarding how he has been doing since the last session. Clinician discussed with the patient regarding that it sounds like he is getting closer to a hearing in the disability process if he has already seen two of their providers. Clinician asked the patient if he has been working on his goal of getting out of the house more and interacting with others. Clinician explained that it sounds like spending time with his brother in the fresh outdoors lifted his  spirits this past weekend. Clinician encouraged the patient to try to visit a park or do some light walking possibly in his neighborhood at least to get a change of scenery. Clinician explained that she understands that with the condition of his lungs and prognosis that he can have issues with motivation but encouraged him to make the best of what he has and enjoy the time he has left.   Return visit in 2 weeks.    Effectiveness:  Established problem, stable/improving (1). Progressing It is felt more time is needed for Interventions to work. . Patient is fully or not fully Other:  orientated to time and place. Patient's Appropriate into problems. Active. Thought process is  Coherent.Minimal: No identifiable suicidal ideation.  Patients presenting with no risk factors but with morbid ruminations; may be classified as minimal risk based on the severity of the depressive symptoms and None.   Homework: Practice self care, focus on positive rather than negative, and get out of the house more often.   Plan: Follow up with Carey BullocksHeather Simpson, LCSW at [facility] in two weeks or earlier if needed.

## 2017-09-01 LAB — BASIC METABOLIC PANEL
BUN/Creatinine Ratio: 12 (ref 9–20)
BUN: 11 mg/dL (ref 6–24)
CALCIUM: 9.4 mg/dL (ref 8.7–10.2)
CHLORIDE: 104 mmol/L (ref 96–106)
CO2: 26 mmol/L (ref 20–29)
Creatinine, Ser: 0.94 mg/dL (ref 0.76–1.27)
GFR calc non Af Amer: 91 mL/min/{1.73_m2} (ref >59)
GFR, EST AFRICAN AMERICAN: 105 mL/min/{1.73_m2} (ref >59)
Glucose: 128 mg/dL — ABNORMAL HIGH (ref 65–99)
Potassium: 4.7 mmol/L (ref 3.5–5.2)
Sodium: 145 mmol/L — ABNORMAL HIGH (ref 134–144)

## 2017-09-09 ENCOUNTER — Ambulatory Visit: Payer: Self-pay | Admitting: Adult Health

## 2017-09-09 VITALS — BP 103/70 | HR 129 | Temp 97.4°F | Ht 68.0 in | Wt 116.8 lb

## 2017-09-09 DIAGNOSIS — J42 Unspecified chronic bronchitis: Secondary | ICD-10-CM

## 2017-09-09 DIAGNOSIS — F331 Major depressive disorder, recurrent, moderate: Secondary | ICD-10-CM

## 2017-09-09 DIAGNOSIS — J410 Simple chronic bronchitis: Secondary | ICD-10-CM

## 2017-09-09 DIAGNOSIS — E43 Unspecified severe protein-calorie malnutrition: Secondary | ICD-10-CM

## 2017-09-09 DIAGNOSIS — R000 Tachycardia, unspecified: Secondary | ICD-10-CM

## 2017-09-09 MED ORDER — VITAMIN D (ERGOCALCIFEROL) 1.25 MG (50000 UNIT) PO CAPS: 50000.0000 [IU] | ORAL_CAPSULE | ORAL | 5 refills | Status: DC

## 2017-09-09 MED ORDER — MIRTAZAPINE 15 MG PO TABS: 7.5000 mg | ORAL_TABLET | Freq: Every day | ORAL | 2 refills | Status: DC

## 2017-09-09 MED ORDER — FLUTICASONE FUROATE-VILANTEROL 200-25 MCG/INH IN AEPB: 1.0000 | INHALATION_SPRAY | Freq: Every day | RESPIRATORY_TRACT | 3 refills | Status: DC

## 2017-09-09 MED ORDER — MULTIVITAMIN ADULT PO TABS: 1.0000 | ORAL_TABLET | Freq: Every day | ORAL | 2 refills | Status: DC

## 2017-09-09 NOTE — Progress Notes (Signed)
Patient: Austin Blevins Male    DOB: 1960/09/15   57 y.o.   MRN: [id number] Visit Date: 09/09/2017  Today's Provider: Mary Sella, NP   Chief Complaint  Patient presents with  . Follow-up   Subjective:    HPI Patient presents for follow-up of severe malnutrition, COPD and depression. He was prescribed ensure during his last visit but states that he has not been drinking it because he doesn't like the taste. He has gained 3Lbs from his last visit. Appetite is still poor. He is still c/o dyspnea and cough even though his cough is apparently better compared to when he was here last. He continues to smoke. Last albumin 1.1. And prealbumin 15. Denies pain, nausea and vomiting. He reports taking all his medications as prescribed but didn't seem to know the medications  Allergies  Allergen Reactions  . Oxycodone     Other reaction(s): Syncope   Previous Medications   ALBUTEROL (PROVENTIL HFA;VENTOLIN HFA) 108 (90 BASE) MCG/ACT INHALER    Inhale 2 puffs into the lungs every 4 (four) hours as needed for wheezing or shortness of breath.   BENZONATATE (TESSALON PERLES) 100 MG CAPSULE    Take 1 capsule (100 mg total) by mouth 3 (three) times daily as needed for cough.   DOCUSATE SODIUM (COLACE) 100 MG CAPSULE    Take 2 capsules (200 mg total) by mouth at bedtime.   FLUTICASONE FUROATE-VILANTEROL (BREO ELLIPTA) 200-25 MCG/INH AEPB    Inhale 1 puff into the lungs daily.   GUAIFENESIN-DEXTROMETHORPHAN (ROBITUSSIN DM) 100-10 MG/5ML SYRUP    Take 10 mLs by mouth every 4 (four) hours as needed for cough.   NUTRITIONAL SUPPLEMENTS (ENSURE HIGH PROTEIN) LIQD    Take 1 Can by mouth 3 (three) times daily.   ROPINIROLE (REQUIP) 0.5 MG TABLET    TAKE ONE TABLET BY MOUTH 3 TIMES A DAY   TIOTROPIUM (SPIRIVA) 18 MCG INHALATION CAPSULE    Place 1 capsule (18 mcg total) into inhaler and inhale daily.   TRAZODONE (DESYREL) 100 MG TABLET    Take 1 tablet (100 mg total) by mouth at bedtime.    Review of Systems   Constitutional: Positive for appetite change. Negative for fatigue and fever.  Eyes: Negative.   Respiratory: Positive for cough, shortness of breath and wheezing.   Cardiovascular: Negative.   Gastrointestinal: Negative.   Endocrine: Negative.   Musculoskeletal: Negative.   Skin: Negative.   Allergic/Immunologic: Negative.   Neurological: Negative.     Social History   Tobacco Use  . Smoking status: Current Every Day Smoker    Packs/day: 0.25    Types: Cigarettes    Last attempt to quit: 06/17/2016    Years since quitting: 1.2  . Smokeless tobacco: Never Used  Substance Use Topics  . Alcohol use: No   Objective:   BP 103/70   Pulse (!) 129   Temp (!) 97.4 F (36.3 C)   Ht '5\' 8"'  (1.727 m)   Wt 116 lb 12.8 oz (53 kg)   BMI 17.76 kg/m   Physical Exam  Constitutional: He is oriented to person, place, and time. He appears well-developed and well-nourished.  Eyes: Pupils are equal, round, and reactive to light. EOM are normal.  Cardiovascular: Regular rhythm and normal heart sounds.  ST at 120 bpm  Pulmonary/Chest: Effort normal and breath sounds normal.  Abdominal: Soft. Bowel sounds are normal.  Neurological: He is alert and oriented to person, place, and time.  Skin: Skin is  warm and dry.    Assessment & Plan:  1. Chronic bronchitis, unspecified chronic bronchitis type ([place]) Patient did not pick up breo ellipta. He is using proventil and spiriva. Encouraged to pick up the second inhaler and to use both as prescribed. RTC if worsening dyspnea  2. Smokers' cough ([place]) Improved; Completed expectorants.  3. Protein-calorie malnutrition, severe Patient encouraged to drink three protein shakes a day and eat high calorie foods. Start MOV, Vitamin D and Remeron 7.72m at HS. Weekly weights recommanded - TSH; Future - Comp Met (CMET); Future  4. Depression, major, recurrent, moderate (HCC) Continue current medications  5. Tachycardia Due to malnutrition. Will check  electrolytes. Patient advised to go to the ED if palpitations  MMary Sella NP   [facility] [place]

## 2017-09-14 ENCOUNTER — Ambulatory Visit: Payer: Self-pay | Admitting: Licensed Clinical Social Worker

## 2017-09-15 ENCOUNTER — Ambulatory Visit
Admission: RE | Admit: 2017-09-15 | Discharge: 2017-09-15 | Disposition: A | Discharge status: Home / Self Care | Payer: Self-pay | Source: Ambulatory Visit | Attending: Internal Medicine | Admitting: Internal Medicine

## 2017-09-15 DIAGNOSIS — J90 Pleural effusion, not elsewhere classified: Secondary | ICD-10-CM | POA: Insufficient documentation

## 2017-09-15 DIAGNOSIS — I700 Atherosclerosis of aorta: Secondary | ICD-10-CM | POA: Insufficient documentation

## 2017-09-15 DIAGNOSIS — R911 Solitary pulmonary nodule: Secondary | ICD-10-CM

## 2017-09-15 DIAGNOSIS — R590 Localized enlarged lymph nodes: Secondary | ICD-10-CM | POA: Insufficient documentation

## 2017-09-15 DIAGNOSIS — J439 Emphysema, unspecified: Secondary | ICD-10-CM | POA: Insufficient documentation

## 2017-09-16 NOTE — Progress Notes (Signed)
[facility] Consultation      Assessment and Plan:  57 year old male with severe, oxygen dependent emphysema, dyspnea on exertion, current smoking.  Severe emphysema.  -severe bullous emphysema, discussed the importance of quitting smoking, as well as starting on inhalers. -Continue current pro-air inhaler. The patient does not have health insurance, he is currently in the process of [facility]. I asked him to let us know if he needs any assistance with this process. I encouraged him to apply for Medicaid.  Fibrosis of the right lung.  -There are progressive fibrotic changes seen in the right lung seen on previous scans. We discussed the possibility that the this could be contributing to dyspnea.  Repeat CT chest 09/15/2017 in comparison with previous on 01/20/2017 showed no progression. -Patient also has a history of rock quarry work, raising the possibility of silicosis.  Chronic hypoxic  Respiratory failure.  -Secondary to above, continue oxygen.  Nicotine abuse. -Discussed the importance of nicotine cessation, spent approximately 3 minutes in discussion. He understands that his breathing will continue to decline with any continued nicotine use, if he stops now his breathing can hopefully stay where it is.  Left lung empyema-chronic. -This has been stable for approximately 2 years, and does not require intervention at this time. There is mild left mediastinal shift, likely caused by the presence of this empyema which is limiting left lung movement, and likely contributing to dyspnea. -Repeat CT chest showed no dyspnea.    Return in about 6 months (around 03/20/2018).   Advance care planning 06/28/17. Patient is aware that he has severe baseline respiratory failure, which is progressive in nature.  He understands that "when it is the good Lord's time to take me he will take me" I discussed with him the concept of DNR status, he is  not certain whether he would want life support and aggressive interventions or not, noting that "definitely not--if I am gone, but if there is a chance, then okay yes" I encouraged him to discuss his wishes with his family.  Date: 09/16/2017  MRN# [id number] Austin Blevins 05-18-1961    Austin Blevins is a 57 y.o. old male seen in consultation for chief complaint of:    Chief Complaint  Patient presents with  . Follow-up    SOB w/activity: prod cough w/thick, white mucus, mainly in mornings:wheezing    Synopsis: Patient is a 57 year old male with chronic exertional dyspnea thought to be multifactorial from severe emphysema, pulmonary fibrosis, chronic empyema, and nicotine abuse at last visit he was strongly urged to quit smoking, he was started on an inhaler, and urged to try to get insurance such as Medicaid. He used to work in a [employer] for 7 years.    Subjective: He feels that his breathing is ok at rest. He has dyspnea with moderate activity, its variable on different days. He is on 2L oxygen, he was on 4 but it gave him a headache.  He is using ventolin 4 times per day or more. [facility] which are still pending.  He continues to smoke about a ppd.   Imaging personally reviewed CT chest 09/15/2017 in comparison with previous, there is no significant change in previously seen areas of loculated fluid and fibrosis.  Radiologist made mention of a intravascular device, which is secondary to the patient's known history of a ventricular shunt. CT chest 01/20/17.  There is very severe emphysematous changes/bullous changes throughout both lungs, worst in the apices. There is fibrotic changes worst at the base of the right upper lobe, and left lower lobe posteriorly. There is thickened pleura at the left base with rim enhancement and likely chronic empyema. Empyema changes have not changed significantly since 6:30/17, however, right upper lobe and  right lower lobe fibrotic changes are new.  Social Hx:   Social History   Tobacco Use  . Smoking status: Current Every Day Smoker    Packs/day: 0.25    Types: Cigarettes    Last attempt to quit: 06/17/2016    Years since quitting: 1.2  . Smokeless tobacco: Never Used  Substance Use Topics  . Alcohol use: No  . Drug use: No   Medication:    Current Outpatient Medications:  .  albuterol (PROVENTIL HFA;VENTOLIN HFA) 108 (90 Base) MCG/ACT inhaler, Inhale 2 puffs into the lungs every 4 (four) hours as needed for wheezing or shortness of breath., Disp: 2 Inhaler, Rfl: 3 .  docusate sodium (COLACE) 100 MG capsule, Take 2 capsules (200 mg total) by mouth at bedtime., Disp: 60 capsule, Rfl: 2 .  fluticasone furoate-vilanterol (BREO ELLIPTA) 200-25 MCG/INH AEPB, Inhale 1 puff into the lungs daily., Disp: 15 each, Rfl: 3 .  mirtazapine (REMERON) 15 MG tablet, Take 0.5 tablets (7.5 mg total) by mouth at bedtime., Disp: 30 tablet, Rfl: 2 .  Multiple Vitamins-Minerals (MULTIVITAMIN ADULT) TABS, Take 1 tablet by mouth daily., Disp: 30 tablet, Rfl: 2 .  Nutritional Supplements (ENSURE HIGH PROTEIN) LIQD, Take 1 Can by mouth 3 (three) times daily., Disp: 30 Can, Rfl: 3 .  rOPINIRole (REQUIP) 0.5 MG tablet, TAKE ONE TABLET BY MOUTH 3 TIMES A DAY, Disp: 90 tablet, Rfl: 3 .  tiotropium (SPIRIVA) 18 MCG inhalation capsule, Place 1 capsule (18 mcg total) into inhaler and inhale daily., Disp: 30 capsule, Rfl: 3 .  traZODone (DESYREL) 100 MG tablet, Take 1 tablet (100 mg total) by mouth at bedtime., Disp: 30 tablet, Rfl: 2 .  Vitamin D, Ergocalciferol, (DRISDOL) 50000 units CAPS capsule, Take 1 capsule (50,000 Units total) by mouth every 7 (seven) days., Disp: 5 capsule, Rfl: 5   Allergies:  Oxycodone  Review of Systems: Gen:  Denies  fever, sweats, chills HEENT: Denies blurred vision, double vision. bleeds, sore throat Cvc:  No dizziness, chest pain. Resp:   Denies cough or sputum production, shortness  of breath Gi: Denies swallowing difficulty, stomach pain. Gu:  Denies bladder incontinence, burning urine Ext:   No Joint pain, stiffness. Skin: No skin rash,  hives  Endoc:  No polyuria, polydipsia. Psych: No depression, insomnia. Other:  All other systems were reviewed with the patient and were negative other that what is mentioned in the HPI.   Physical Examination:   VS: BP 132/80 (BP Location: Left Arm, Cuff Size: Normal)   Pulse (!) 116   Ht  (1.727 m)   Wt 119 lb (54 kg)   BMI 18.09 kg/m   General Appearance: No distress  Neuro:without focal findings,  speech normal,  HEENT: PERRLA, EOM intact.   Pulmonary: normal breath sounds, Decreased air entry bilaterally. CardiovascularNormal S1,S2.  No m/r/g.   Abdomen: Benign, Soft, non-tender. Renal:  No costovertebral tenderness  GU:  No performed at this time. Endoc: No evident thyromegaly, no signs of acromegaly. Skin:   warm, no rashes, no ecchymosis  Extremities: normal, no cyanosis, clubbing.  Other findings:    LABORATORY PANEL:  CBC No results for input(s): WBC, HGB, HCT, PLT in the last 168 hours. ------------------------------------------------------------------------------------------------------------------  Chemistries  No results for input(s): NA, K, CL, CO2, GLUCOSE, BUN, CREATININE, CALCIUM, MG, AST, ALT, ALKPHOS, BILITOT in the last 168 hours.  Invalid input(s): GFRCGP ------------------------------------------------------------------------------------------------------------------  Cardiac Enzymes No results for input(s): TROPONINI in the last 168 hours. ------------------------------------------------------------  RADIOLOGY:  Ct Chest Wo Contrast  Result Date: 09/16/2017 CLINICAL DATA:  Short of breath EXAM: CT CHEST WITHOUT CONTRAST TECHNIQUE: Multidetector CT imaging of the chest was performed following the standard protocol without IV contrast. COMPARISON:  02/23/2017 FINDINGS:  Cardiovascular: Atherosclerotic calcifications of the aorta and great vessels are noted. There is a linear hyper dense entity coiled in the right jugular vein with its tip in the right innominate vein. The superior extent is not clearly visualized on this study. A retained metal wire or vascular catheter cannot be excluded. It was present on a study dated 11/15/2014. Minimal LAD territory coronary artery calcification. Mediastinum/Nodes: 12 mm pre carinal node is stable. 11 mm subcarinal node is stable. Other scattered smaller mediastinal nodes are not significantly changed. No pericardial effusion. Lungs/Pleura: Loculated pleural effusion at the lateral left lung base is stable. Severe emphysema. Scattered scarring in the lungs is stable. Dense chronic appearing consolidation at the left lung base adjacent to the loculated pleural effusion is stable. There is associated bronchiectasis likely related to fibrosis and traction. There is scarring in the lingula which is unchanged. Upper Abdomen: Punctate pancreatic calcifications are unchanged. Musculoskeletal: No vertebral compression deformity. IMPRESSION: There is a wire or catheter within the right jugular vein. The superior extent is not included on this study. It was present on a CT chest dated 11/15/2014. A retained catheter or wire fragment in the right jugular vein cannot be excluded. Correlate clinically as for the need for further imaging in the head and neck area. Also correlate with physical exam of the neck. Severe emphysema and pulmonary scarring are stable Chronic consolidation at the left lung base. Chronic loculated left pleural effusion. Chronic mediastinal adenopathy. Aortic Atherosclerosis (ICD10-I70.0) and Emphysema (ICD10-J43.9). Electronically Signed   By: Jolaine Click M.D.   On: 09/16/2017 08:37       Thank  you for the consultation and for allowing [facility] to assist in the care of your patient. Our  recommendations are noted above.  Please contact us if we can be of further service.   Wells Guiles, MD.  Board Certified in Internal Medicine, Pulmonary Medicine, Critical Care Medicine, and Sleep Medicine.  [facility] Office Number: [phone]  Santiago Glad, M.D.  Billy Fischer, M.D  09/16/2017

## 2017-09-17 ENCOUNTER — Encounter: Payer: Self-pay | Admitting: Internal Medicine

## 2017-09-17 ENCOUNTER — Ambulatory Visit (INDEPENDENT_AMBULATORY_CARE_PROVIDER_SITE_OTHER): Payer: Self-pay | Admitting: Internal Medicine

## 2017-09-17 VITALS — BP 132/80 | HR 116 | Ht 68.0 in | Wt 119.0 lb

## 2017-09-17 DIAGNOSIS — J449 Chronic obstructive pulmonary disease, unspecified: Secondary | ICD-10-CM

## 2017-09-17 DIAGNOSIS — J9611 Chronic respiratory failure with hypoxia: Secondary | ICD-10-CM

## 2017-09-17 DIAGNOSIS — F17210 Nicotine dependence, cigarettes, uncomplicated: Secondary | ICD-10-CM

## 2017-09-17 DIAGNOSIS — R0609 Other forms of dyspnea: Secondary | ICD-10-CM

## 2017-09-17 NOTE — Patient Instructions (Signed)
Try to work on getting an inhaler, either from [facility] or by calling the company for assistance.

## 2017-10-07 ENCOUNTER — Ambulatory Visit: Payer: Self-pay | Admitting: Licensed Clinical Social Worker

## 2017-10-07 ENCOUNTER — Ambulatory Visit: Payer: Self-pay | Admitting: Adult Health

## 2017-10-07 DIAGNOSIS — F411 Generalized anxiety disorder: Secondary | ICD-10-CM

## 2017-10-07 NOTE — Progress Notes (Signed)
Subjective:  Patient ID: Austin Blevins, male   DOB: 07/13/1960, 57 y.o.   MRN: [id number]    Increase emotional regulation  [name] presents with anxiety. Onset of symptoms was several years with gradually improving improving. Symptoms have been occurringNot influenced by the time of the day.  Symptoms are currently rated mild. Associated signs and symptoms include: attention difficulties and highly negativeHealth problems Marital or family conflict.   He reports that he has been doing okay. He notes that his anxiety has been high because his sister ended up in the emergency room on life support. He explains that his sister was in the hospital for three days practically brain dead. He notes that she is doing better now but she is lucky to be alive. He explains that his mood has been pretty good for the most part. He notes that he has been going fishing some and spending time at the house. He denies suicidal and homicidal thoughts.   Therapeutic Interventions: Cognitive Behavioral therapy was utilized by the clinician during today's session focusing on client' anxiety and effect on normal cognition. Clinician processed with the patient regarding how he has been doing since the last session. Clinician processed with the patient regarding how his sister is doing now and is glad to hear that she is doing better now. Clinician processed with the patient how his mood has been. Clinician asked the patient how he has been occupying his time. Clinician encouraged the patient to utilized relaxation techniques when stressed.   Return visit in 2 weeks.    Effectiveness:  Established problem, stable/improving (1). Progressing It is felt more time is needed for Interventions to work.  . Patient is fully Other:  orientated to time and place. Patient's Appropriate into problems. Active. Thought process is  Coherent.Minimal: No identifiable suicidal ideation.  Patients presenting with no risk factors but with  morbid ruminations; may be classified as minimal risk based on the severity of the depressive symptoms and None.   Homework: Utilize relaxation techniques when stressed.   Plan: Follow up with [name], LCSW at [facility] in two weeks or earlier.

## 2017-10-12 ENCOUNTER — Ambulatory Visit: Payer: Self-pay | Admitting: Family Medicine

## 2017-10-12 VITALS — BP 109/74 | HR 111 | Temp 97.5°F | Wt 120.7 lb

## 2017-10-12 DIAGNOSIS — F411 Generalized anxiety disorder: Secondary | ICD-10-CM

## 2017-10-12 DIAGNOSIS — G4700 Insomnia, unspecified: Secondary | ICD-10-CM

## 2017-10-12 DIAGNOSIS — J410 Simple chronic bronchitis: Secondary | ICD-10-CM

## 2017-10-12 DIAGNOSIS — Z716 Tobacco abuse counseling: Secondary | ICD-10-CM

## 2017-10-12 DIAGNOSIS — J449 Chronic obstructive pulmonary disease, unspecified: Secondary | ICD-10-CM

## 2017-10-12 DIAGNOSIS — Z9981 Dependence on supplemental oxygen: Secondary | ICD-10-CM

## 2017-10-12 DIAGNOSIS — F419 Anxiety disorder, unspecified: Secondary | ICD-10-CM

## 2017-10-12 DIAGNOSIS — E43 Unspecified severe protein-calorie malnutrition: Secondary | ICD-10-CM

## 2017-10-12 DIAGNOSIS — Z09 Encounter for follow-up examination after completed treatment for conditions other than malignant neoplasm: Secondary | ICD-10-CM

## 2017-10-12 DIAGNOSIS — J42 Unspecified chronic bronchitis: Secondary | ICD-10-CM

## 2017-10-12 MED ORDER — ROPINIROLE HCL 0.5 MG PO TABS
ORAL_TABLET | ORAL | 2 refills | Status: DC
Start: 2017-10-12 — End: 2018-03-14

## 2017-10-12 MED ORDER — TIOTROPIUM BROMIDE MONOHYDRATE 18 MCG IN CAPS: 18.0000 ug | ORAL_CAPSULE | Freq: Every day | RESPIRATORY_TRACT | 2 refills | Status: DC

## 2017-10-12 MED ORDER — ALBUTEROL SULFATE HFA 108 (90 BASE) MCG/ACT IN AERS: 2.0000 | INHALATION_SPRAY | RESPIRATORY_TRACT | 2 refills | Status: DC | PRN

## 2017-10-12 MED ORDER — TRAZODONE HCL 100 MG PO TABS: 100.0000 mg | ORAL_TABLET | Freq: Every day | ORAL | 2 refills | Status: DC

## 2017-10-12 MED ORDER — MIRTAZAPINE 15 MG PO TABS
7.5000 mg | ORAL_TABLET | Freq: Every day | ORAL | 2 refills | Status: DC
Start: 2017-10-12 — End: 2018-07-19

## 2017-10-12 NOTE — Progress Notes (Signed)
Subjective:     Patient ID: Austin Blevins, male   DOB: 1960-11-18, 57 y.o.   MRN: [id number]   PCP: Raliegh Ip, NP   Chief Complaint  Patient presents with  . Follow-up    HPI  Austin Blevins has a history of COPD, Anxiety. Chronic Bronchitis, Depression, Malnutrition. He is here today for follow up.   Current Status: Since his last office visit he is continues to be on 2-4 L of O2 daily. Pt denies fevers, chills, or dizziness. Pt endorses SOB. Pt denies chest pain. He describes a shooting pain from his R upper arm across his chest to his L arm occassionaly but he cannot identify a cause. Appetite is still poor. He continues to smoke 1 pack/day. Pt reports his anxiety has improved and he continues to see Heather. He reports taking all meds as prescribed. He has shortness of breath.    Past Medical History:  Diagnosis Date  . Anxiety   . COPD (chronic obstructive pulmonary disease) (HCC)   . Depression   . Hydrocephalus    Social History   Socioeconomic History  . Marital status: Legally Separated    Spouse name: Not on file  . Number of children: 0  . Years of education: eigth grade  . Highest education level: 8th grade  Occupational History  . Not on file  Social Needs  . Financial resource strain: Very hard  . Food insecurity:    Worry: Never true    Inability: Never true  . Transportation needs:    Medical: No    Non-medical: No  Tobacco Use  . Smoking status: Current Every Day Smoker    Packs/day: 1.00    Types: Cigarettes    Last attempt to quit: 06/17/2016    Years since quitting: 1.3  . Smokeless tobacco: Never Used  Substance and Sexual Activity  . Alcohol use: No  . Drug use: No  . Sexual activity: Not on file  Lifestyle  . Physical activity:    Days per week: 0 days    Minutes per session: 0 min  . Stress: Very much  Relationships  . Social connections:    Talks on phone: Twice a week    Gets together: Never    Attends religious service: Never     Active member of club or organization: No    Attends meetings of clubs or organizations: Never    Relationship status: Separated  . Intimate partner violence:    Fear of current or ex partner: No    Emotionally abused: No    Physically abused: No    Forced sexual activity: No  Other Topics Concern  . Not on file  Social History Narrative   Administered social determinants screening, PHQ 9, GAD 7, and Biopsychosocial assessment.     Past Surgical History:  Procedure Laterality Date  . BRAIN SURGERY  2003   VA shunt for hydrocephalus  . ventricular shunt     Family History  Problem Relation Age of Onset  . Diabetes Mellitus II Mother   . CAD Father   . Hypertension Father     There is no immunization history on file for this patient.  Current Meds  Medication Sig  . albuterol (PROVENTIL HFA;VENTOLIN HFA) 108 (90 Base) MCG/ACT inhaler Inhale 2 puffs into the lungs every 4 (four) hours as needed for wheezing or shortness of breath.  . fluticasone furoate-vilanterol (BREO ELLIPTA) 200-25 MCG/INH AEPB Inhale 1 puff into the lungs daily.  [name] Kitchen  mirtazapine (REMERON) 15 MG tablet Take 0.5 tablets (7.5 mg total) by mouth at bedtime.  . Multiple Vitamins-Minerals (MULTIVITAMIN ADULT) TABS Take 1 tablet by mouth daily.  . Nutritional Supplements (ENSURE HIGH PROTEIN) LIQD Take 1 Can by mouth 3 (three) times daily.  [name] Kitchen rOPINIRole (REQUIP) 0.5 MG tablet TAKE ONE TABLET BY MOUTH 3 TIMES A DAY  . tiotropium (SPIRIVA) 18 MCG inhalation capsule Place 1 capsule (18 mcg total) into inhaler and inhale daily.  . traZODone (DESYREL) 100 MG tablet Take 1 tablet (100 mg total) by mouth at bedtime.  . Vitamin D, Ergocalciferol, (DRISDOL) 50000 units CAPS capsule Take 1 capsule (50,000 Units total) by mouth every 7 (seven) days.  . [DISCONTINUED] albuterol (PROVENTIL HFA;VENTOLIN HFA) 108 (90 Base) MCG/ACT inhaler Inhale 2 puffs into the lungs every 4 (four) hours as needed for wheezing or shortness of  breath.  . [DISCONTINUED] mirtazapine (REMERON) 15 MG tablet Take 0.5 tablets (7.5 mg total) by mouth at bedtime.  . [DISCONTINUED] rOPINIRole (REQUIP) 0.5 MG tablet TAKE ONE TABLET BY MOUTH 3 TIMES A DAY  . [DISCONTINUED] tiotropium (SPIRIVA) 18 MCG inhalation capsule Place 1 capsule (18 mcg total) into inhaler and inhale daily.  . [DISCONTINUED] traZODone (DESYREL) 100 MG tablet Take 1 tablet (100 mg total) by mouth at bedtime.   Allergies  Allergen Reactions  . Oxycodone     Other reaction(s): Syncope    BP 109/74   Pulse (!) 111   Temp (!) 97.5 F (36.4 C)   Wt 120 lb 11.2 oz (54.7 kg)   BMI 18.35 kg/m     Allergies as of 10/12/2017      Reactions   Oxycodone    Other reaction(s): Syncope      Medication List        Accurate as of 10/12/17  6:24 PM. Always use your most recent med list.          albuterol 108 (90 Base) MCG/ACT inhaler Commonly known as:  PROVENTIL HFA;VENTOLIN HFA Inhale 2 puffs into the lungs every 4 (four) hours as needed for wheezing or shortness of breath.   docusate sodium 100 MG capsule Commonly known as:  COLACE Take 2 capsules (200 mg total) by mouth at bedtime.   ENSURE HIGH PROTEIN Liqd Take 1 Can by mouth 3 (three) times daily.   fluticasone furoate-vilanterol 200-25 MCG/INH Aepb Commonly known as:  BREO ELLIPTA Inhale 1 puff into the lungs daily.   mirtazapine 15 MG tablet Commonly known as:  REMERON Take 0.5 tablets (7.5 mg total) by mouth at bedtime.   MULTIVITAMIN ADULT Tabs Take 1 tablet by mouth daily.   rOPINIRole 0.5 MG tablet Commonly known as:  REQUIP TAKE ONE TABLET BY MOUTH 3 TIMES A DAY   tiotropium 18 MCG inhalation capsule Commonly known as:  SPIRIVA Place 1 capsule (18 mcg total) into inhaler and inhale daily.   traZODone 100 MG tablet Commonly known as:  DESYREL Take 1 tablet (100 mg total) by mouth at bedtime.   Vitamin D (Ergocalciferol) 50000 units Caps capsule Commonly known as:  DRISDOL Take 1  capsule (50,000 Units total) by mouth every 7 (seven) days.      Allergies  Allergen Reactions  . Oxycodone     Other reaction(s): Syncope   BP 109/74   Pulse (!) 111   Temp (!) 97.5 F (36.4 C)   Wt 120 lb 11.2 oz (54.7 kg)   BMI 18.35 kg/m    Review of Systems  Constitutional:  Positive for appetite change (poor appetited).  HENT: Negative.   Eyes: Negative.   Respiratory: Positive for cough, shortness of breath and wheezing (upper and mid lung lobes).   Cardiovascular: Positive for chest pain (occasional ).  Gastrointestinal: Negative.   Endocrine: Negative.   Genitourinary: Negative.   Musculoskeletal: Negative.   Skin: Negative.   Allergic/Immunologic: Negative.   Neurological: Positive for dizziness, tremors, weakness and light-headedness.       Restless leg syndrome  Hematological: Negative.   Psychiatric/Behavioral: Negative.    Objective:   Physical Exam  Constitutional: He is oriented to person, place, and time. He appears well-developed and well-nourished.  HENT:  Head: Normocephalic and atraumatic.  Neck: Normal range of motion. Neck supple.  Cardiovascular: Normal rate, regular rhythm, normal heart sounds and intact distal pulses.  Pulmonary/Chest: Effort normal and breath sounds normal.  Abdominal: Soft. Bowel sounds are normal.  Musculoskeletal: Normal range of motion. He exhibits no edema.  Neurological: He is alert and oriented to person, place, and time.  Skin: Skin is warm and dry. Capillary refill takes less than 2 seconds.  Psychiatric: He has a normal mood and affect. His behavior is normal. Judgment and thought content normal.  Vitals reviewed.  Assessment:   1. Chronic obstructive pulmonary disease, unspecified COPD type (HCC) 2. Chronic bronchitis, unspecified chronic bronchitis type (HCC) 3. Generalized anxiety disorder 4. Smokers' cough (HCC) 5. Tobacco abuse counseling 6. Oxygen dependent 7. Insomnia, unspecified type 8.  Protein-calorie malnutrition, severe 9. Anxiety 10. Follow up  Plan:   1. Chronic obstructive pulmonary disease, unspecified COPD type (HCC) Stable. No distress noted.   2. Chronic bronchitis, unspecified chronic bronchitis type (HCC) See # 2.   3. Generalized anxiety disorder Continue Trazodone as directed. He will continue to follow up with [facility] as needed.   4. Smokers' cough (HCC) Stable. Not worsening. He will continue Albuterol as directed.   5. Tobacco abuse counseling He continues to smoke a pack of cigarettes daily. He will contact office when he is ready to quit smoking.   6. Oxygen dependent He currently uses 2-4 Liters continuous.   7. Insomnia, unspecified type Stable. Continue Trazodone as directed.   8. Protein-calorie malnutrition, severe He continues to have decreased appetite. We will refer him to Nutritionist if weight continue to drop.   9. Anxiety Stable. Continue Trazodone as directed.   10. Follow up He will follow up in 2 months.   Meds ordered this encounter  Medications  . albuterol (PROVENTIL HFA;VENTOLIN HFA) 108 (90 Base) MCG/ACT inhaler    Sig: Inhale 2 puffs into the lungs every 4 (four) hours as needed for wheezing or shortness of breath.    Dispense:  2 Inhaler    Refill:  2  . mirtazapine (REMERON) 15 MG tablet    Sig: Take 0.5 tablets (7.5 mg total) by mouth at bedtime.    Dispense:  30 tablet    Refill:  2  . rOPINIRole (REQUIP) 0.5 MG tablet    Sig: TAKE ONE TABLET BY MOUTH 3 TIMES A DAY    Dispense:  90 tablet    Refill:  2  . tiotropium (SPIRIVA) 18 MCG inhalation capsule    Sig: Place 1 capsule (18 mcg total) into inhaler and inhale daily.    Dispense:  30 capsule    Refill:  2  . traZODone (DESYREL) 100 MG tablet    Sig: Take 1 tablet (100 mg total) by mouth at bedtime.    Dispense:  30 tablet    Refill:  2   Raliegh Ip,  MSN, FNP-BC [facility] [address] [place], [place] [address] [phone]

## 2017-10-14 ENCOUNTER — Ambulatory Visit: Payer: Self-pay | Admitting: Adult Health

## 2017-10-21 ENCOUNTER — Other Ambulatory Visit: Payer: Self-pay | Admitting: Adult Health Nurse Practitioner

## 2017-10-21 ENCOUNTER — Ambulatory Visit: Payer: Self-pay | Admitting: Licensed Clinical Social Worker

## 2017-10-21 DIAGNOSIS — F419 Anxiety disorder, unspecified: Secondary | ICD-10-CM

## 2017-10-21 MED ORDER — ALBUTEROL SULFATE (2.5 MG/3ML) 0.083% IN NEBU: 2.5000 mg | INHALATION_SOLUTION | Freq: Four times a day (QID) | RESPIRATORY_TRACT | 12 refills | Status: DC | PRN

## 2017-10-21 NOTE — Progress Notes (Signed)
Subjective:  Patient ID: Austin Blevins, male   DOB: March 02, 1961, 57 y.o.   MRN: [id number]    Increase emotional regulation  [name]  presents with anxiety. Onset of symptoms was last year with gradually improving improving. Symptoms have been occurringNot influenced by the time of the day.  Symptoms are currently rated moderate. Associated signs and symptoms include: attention difficulties, highly negative and sadnessHealth problems Marital or family conflict.   He reports that things have been okay. He notes that he has been trying to stay and keep busy. He explains that the has been trying to help other family members solve their problems. He notes that his anxiety is not for himself but for others. 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 He notes that he is supposed to hear from disability in about two weeks. He explains that he is still working on quitting smoking but the recent stress with his family has caused him to eliminate any progress he had already made. He denies suicidal and homicidal thoughts.   Therapeutic Interventions: Cognitive Behavioral therapy was utilized by the clinician focusing on anxiety and affect on normal cognition. Clinician processed with the patient how he has been doing since the last session. Clinician asked the client how he has been keeping busy. Clinician explained that in terms of helping family members that he is unable to control anyone's behaviors, thoughts, or actions but his own. Clinician processed with the patient regarding if he has been practicing self care and utilizing his coping skills. Clinician suggested that the client try replacement of cigarettes with a piece of candy or gum when stressed.   Return visit in 2 weeks.    Effectiveness:  Established problem, stable/improving (1). Progressing It is felt more time is needed for Interventions to work.  . Patient is  fully or not fully Other:  orientated to time and place. Patient's Appropriate into problems. Active. Thought process is  Coherent.Minimal: No identifiable suicidal ideation.  Patients presenting with no risk factors but with morbid ruminations; may be classified as minimal risk based on the severity of the depressive symptoms and None.   Homework: Utilize coping skills and trying a piece of gum or candy in his mouth instead of smoking a cigarette to attempt to get the same oral fixation.   Plan: Follow up with Carey BullocksHeather Simpson, LCSW at [facility] in two weeks or earlier if needed.

## 2017-11-04 ENCOUNTER — Ambulatory Visit: Payer: Self-pay | Admitting: Licensed Clinical Social Worker

## 2017-11-04 DIAGNOSIS — F331 Major depressive disorder, recurrent, moderate: Secondary | ICD-10-CM

## 2017-11-04 NOTE — Progress Notes (Signed)
Subjective:  Patient ID: Austin Blevins, male   DOB: [birth year], 57 y.o.   MRN: [id number]    Increase emotional regulation  [name] presents with depression. Onset of symptoms was in the last two years with gradually improving improving. Symptoms have been occurringNot influenced by the time of the day.  Symptoms are currently rated moderate. Associated signs and symptoms include: anger, attention difficulties, highly negative and sadnessFinancial difficulties Health problems Marital or family conflict.   He reports that he has been doing okay. He explains that he keeps hearing the same response from disability that they are working on it. He notes that he has come to the end of his savings so he could really use the money. He explains that for father's day weekend that he saw his step daughter form his first marriage and her kids, which was nice. He notes that his mood has not been the best due to getting into an argument with his ex in the last week. He explains that he has not been visiting over there as much and has been short with her on the phone. He notes that lately his ex has been hateful and all he does is go out of his way to make sure she is taking care of. He denies suicidal and homicidal thoughts.   Therapeutic Interventions: Cognitive Behavioral therapy was utilized by the clinician by providing emotional support and encouragement tot he patient. Clinician processed with the patient regarding how he has been doing since the last session. Clinician asked the patient about his status with disability. Clinician processed with the patient regarding that with his health condition that hopefully he will hear good news soon but its a long process. Clinician asked the patient how he spent his father's day weekend. Clinician processed with the patient regarding why his mood has not been the best. Clinician explained that he can only control his own emotions and behaviors, not anyone else's.  Clinician suggested that he set a healthy boundary with his ex that if she is going to be hateful towards him that he will not come around or speak with her until she can talk and treat him in a respectful manner.   Return visit in 1 month.    Effectiveness:  Established problem, stable/improving (1). Progressing It is felt more time is needed for Interventions to work.  . Patient is fully or not fully Other:  orientated to time and place. Patient's Appropriate into problems. Active. Thought process is  Coherent.Minimal: No identifiable suicidal ideation.  Patients presenting with no risk factors but with morbid ruminations; may be classified as minimal risk based on the severity of the depressive symptoms and None.   Homework: Set healthy boundaries with ex and practice self care.   Plan: Follow up with Carey BullocksHeather Fern Asmar, LCSW at [facility] in one month or earlier if needed.

## 2017-11-23 ENCOUNTER — Telehealth: Payer: Self-pay | Admitting: Pharmacist

## 2017-11-23 NOTE — Telephone Encounter (Signed)
11/23/2017 8:38:17 AM - Virgel BouquetBreo Ellipta 200/25 refill  11/23/17 Placed refill for Breo Ellipta 200/25 on line with [facility], to ship 12/06/17, order# [id number].Forde RadonAJ

## 2017-12-14 ENCOUNTER — Other Ambulatory Visit: Payer: Self-pay

## 2017-12-14 ENCOUNTER — Ambulatory Visit: Payer: Self-pay | Admitting: Licensed Clinical Social Worker

## 2017-12-14 DIAGNOSIS — F331 Major depressive disorder, recurrent, moderate: Secondary | ICD-10-CM

## 2017-12-14 DIAGNOSIS — E43 Unspecified severe protein-calorie malnutrition: Secondary | ICD-10-CM

## 2017-12-14 NOTE — Progress Notes (Signed)
Subjective:  Patient ID: Austin Blevins, male   DOB: [birth year], 57 y.o.   MRN: [id number]    Increase emotional regulation  [name]  presents with depression. Onset of symptoms was in the last few years with gradually improving improving. Symptoms have been occurringNot influenced by the time of the day.  Symptoms are currently rated moderate. Associated signs and symptoms include: highly negative, poor social interaction, stubborn and irritability.Health problems Marital or family conflict.   He reports that he has been doing okay. He notes that his smoking has increased because he figures he doesn't have that much longer on this earth and he might as well do the things that he enjoys. He notes that their hasn't been any more stress than usual. He explains that he hasn't been going to his ex wife's house as much because he knows that the whole situation and family dynamics at her house upset him. He explains that he would go fishing but it's been to hot and the weather has been changing. He explains that he mostly stays inside the majority of the day and tries to accomplish things at night because he can't breath with the heat, humidity, and pollen. He explains that it annoys him having to carry around his oxygen machine. He reports that he was awarded disability and should receive a payment within sixty days or so. He denies suicidal and homicidal thoughts.   Therapeutic Interventions: Cognitive Behavioral therapy was utilized by the clinician focusing on depression by providing emotional support and encouragement to the patient. Clinician processed with the patient regarding how he has been doing since the last session. Clinician processed with the patient regarding how he was doing so well with his progress in quitting smoking and asked what caused the change. Clinician explained that she realizes that he enjoys it and it relaxes him but if he stopped it could potentially prolong his life. Clinician  explained that negative thinking and and a negative attitude will not help to change his mood. Clinician explained that he would probably not have to utilize his oxygen machine as much if he wasn't smoking a pack of cigarettes a day. Clinician congratulated the patient on being awarded disability. Clinician encouraged the patient to focus on the positives in his life, what he does have, and things for enjoyment.   Return visit in 1 month.    Effectiveness:  Review of last therapy session (1). Progressing It is felt more time is needed for Interventions to work.  . Patient is fully or not fully Other:  orientated to time and place. Patient's Appropriate into problems. Active. Thought process is  Coherent.Minimal: No identifiable suicidal ideation.  Patients presenting with no risk factors but with morbid ruminations; may be classified as minimal risk based on the severity of the depressive symptoms and None.   Homework: Gratitude.  Plan: Follow up with Carey BullocksHeather Donisha Hoch, LCSW at [facility] in one month or earlier if needed.

## 2017-12-15 LAB — COMPREHENSIVE METABOLIC PANEL
A/G RATIO: 1.1 — AB (ref 1.2–2.2)
ALBUMIN: 3.4 g/dL — AB (ref 3.5–5.5)
ALT: 12 IU/L (ref 0–44)
AST: 12 IU/L (ref 0–40)
Alkaline Phosphatase: 98 IU/L (ref 39–117)
BILIRUBIN TOTAL: 0.3 mg/dL (ref 0.0–1.2)
BUN / CREAT RATIO: 12 (ref 9–20)
BUN: 14 mg/dL (ref 6–24)
CHLORIDE: 102 mmol/L (ref 96–106)
CO2: 25 mmol/L (ref 20–29)
Calcium: 9.3 mg/dL (ref 8.7–10.2)
Creatinine, Ser: 1.13 mg/dL (ref 0.76–1.27)
GFR calc non Af Amer: 72 mL/min/{1.73_m2} (ref >59)
GFR, EST AFRICAN AMERICAN: 84 mL/min/{1.73_m2} (ref >59)
Globulin, Total: 3.2 g/dL (ref 1.5–4.5)
Glucose: 150 mg/dL — ABNORMAL HIGH (ref 65–99)
Potassium: 5.1 mmol/L (ref 3.5–5.2)
Sodium: 141 mmol/L (ref 134–144)
TOTAL PROTEIN: 6.6 g/dL (ref 6.0–8.5)

## 2017-12-15 LAB — TSH: TSH: 1.55 u[IU]/mL (ref 0.450–4.500)

## 2017-12-20 ENCOUNTER — Telehealth: Payer: Self-pay | Admitting: Pharmacist

## 2017-12-20 NOTE — Telephone Encounter (Signed)
12/20/2017 4:01:13 PM - Ventolin refill  12/20/17 Placed refill online with [facility] for Ventolin HFA, to release 01/25/16, order# [id number].Forde RadonAJ

## 2017-12-21 ENCOUNTER — Ambulatory Visit: Payer: Self-pay | Admitting: Adult Health Nurse Practitioner

## 2017-12-21 VITALS — BP 131/88 | HR 104 | Temp 97.5°F | Ht 67.0 in | Wt 121.7 lb

## 2017-12-21 DIAGNOSIS — J439 Emphysema, unspecified: Secondary | ICD-10-CM

## 2017-12-21 NOTE — Progress Notes (Signed)
Subjective:    Patient ID: Austin Blevins, male    DOB: 07/31/1960, 57 y.o.   MRN: [id number]  HPI  Austin Blevins is a 57 yo M here for f/u of decreased appetite and SOB.  SOB: He reports it has not improved and is wearing his oxygen more. He reports he is unable to walk long distances. Pt continues to smoke and is not ready to quit. He saw Pulmonologist on 09/17/17 w/ Dx of Fibrosis of R lung, severe emphysema, L lung empyema-chronic.  Decreased appetite: pt has not lost weight since last visit.   Patient Active Problem List   Diagnosis Date Noted  . Anxiety 06/22/2017  . Hx of smoking 05/25/2017  . Chronic pancreatitis (HCC) 05/25/2017  . Healthcare maintenance 02/11/2017  . COPD (chronic obstructive pulmonary disease) (HCC) 02/11/2017  . Protein-calorie malnutrition, severe 01/22/2017  . Pleural effusion, left 11/08/2014  . Depression, major, recurrent, moderate (HCC) 04/11/2014  . Malnutrition (HCC) 04/11/2014  . Smokers' cough (HCC) 03/26/2014   Allergies as of 12/21/2017      Reactions   Oxycodone    Other reaction(s): Syncope      Medication List        Accurate as of 12/21/17  6:51 PM. Always use your most recent med list.          albuterol 108 (90 Base) MCG/ACT inhaler Commonly known as:  PROVENTIL HFA;VENTOLIN HFA Inhale 2 puffs into the lungs every 4 (four) hours as needed for wheezing or shortness of breath.   albuterol (2.5 MG/3ML) 0.083% nebulizer solution Commonly known as:  PROVENTIL Take 3 mLs (2.5 mg total) by nebulization every 6 (six) hours as needed for wheezing or shortness of breath.   docusate sodium 100 MG capsule Commonly known as:  COLACE Take 2 capsules (200 mg total) by mouth at bedtime.   ENSURE HIGH PROTEIN Liqd Take 1 Can by mouth 3 (three) times daily.   fluticasone furoate-vilanterol 200-25 MCG/INH Aepb Commonly known as:  BREO ELLIPTA Inhale 1 puff into the lungs daily.   mirtazapine 15 MG tablet Commonly known as:  REMERON Take  0.5 tablets (7.5 mg total) by mouth at bedtime.   MULTIVITAMIN ADULT Tabs Take 1 tablet by mouth daily.   rOPINIRole 0.5 MG tablet Commonly known as:  REQUIP TAKE ONE TABLET BY MOUTH 3 TIMES A DAY   tiotropium 18 MCG inhalation capsule Commonly known as:  SPIRIVA Place 1 capsule (18 mcg total) into inhaler and inhale daily.   traZODone 100 MG tablet Commonly known as:  DESYREL Take 1 tablet (100 mg total) by mouth at bedtime.   Vitamin D (Ergocalciferol) 50000 units Caps capsule Commonly known as:  DRISDOL Take 1 capsule (50,000 Units total) by mouth every 7 (seven) days.        Review of Systems  Respiratory: Positive for shortness of breath.       Objective:   Physical Exam  Constitutional: He is oriented to person, place, and time. He appears well-developed and well-nourished.  Cardiovascular: Regular rhythm and normal heart sounds. Tachycardia present.  Pulmonary/Chest: He has no decreased breath sounds.  Neurological: He is alert and oriented to person, place, and time.  Psychiatric: He has a normal mood and affect. His behavior is normal.  Vitals reviewed.   BP 131/88   Pulse (!) 104   Temp (!) 97.5 F (36.4 C)   Ht 5\' 7"  (1.702 m)   Wt 121 lb 11.2 oz (55.2 kg)   BMI  19.06 kg/m        Assessment & Plan:   Chronic exertional dyspnea: Educated pt on Dx and outcomes. Will continue current treatment and monitor.  Keep f/u w/ pulmonologist.  Encouraged pt to continue eating well and using inhalers.  Discussed smoking cessation and risk of death.   F/u in 6 mo or PRN.   Pt is applying for Medicaid on 8/19.

## 2017-12-27 ENCOUNTER — Telehealth: Payer: Self-pay | Admitting: Pharmacist

## 2017-12-27 NOTE — Telephone Encounter (Signed)
[name] [facility] into [facility] 12/24/17 to inquire about his Ventolin inhaler refill. He received 3 inhalers on 11/16/17 and states that he is on his last inhaler. He appears to be using, on average, 13 puffs per day. He also uses albuterol via nebulizer and Breo Ellipta daily. Patient states that he has "cut down" on smoking, but states he doesn't want to quit as this is the only pleasure he enjoys at this time. He states that he does not have much time left and wants to get out and be active and realizes that he is using too much of the Ventolin. The only way he is able to get out of the house is to use the Ventolin inhaler throughout the day. The patient assistance company will not fill the Ventolin early. It should be available to refill the first week of September.  I encouraged the patient to use the Nebulizer while at home and the [facility] when he is out. The nebulizer treatment will have more efficacy due to the smaller particles. I told the patient that I would contact his provider to see if there was anything else we could do to make him comfortable. Austin Blevins K. Joelene MillinHarrison, BS, PharmD [facility] Operations Coordinator [phone]

## 2017-12-30 ENCOUNTER — Other Ambulatory Visit: Payer: Self-pay | Admitting: Adult Health Nurse Practitioner

## 2017-12-30 MED ORDER — VARENICLINE TARTRATE 1 MG PO TABS: 1.0000 mg | ORAL_TABLET | Freq: Two times a day (BID) | ORAL | 1 refills | Status: DC

## 2018-01-11 ENCOUNTER — Ambulatory Visit: Payer: Self-pay | Admitting: Licensed Clinical Social Worker

## 2018-02-10 ENCOUNTER — Telehealth: Payer: Self-pay | Admitting: Pharmacist

## 2018-02-10 NOTE — Telephone Encounter (Signed)
02/10/2018 11:16:17 AM - Virgel Bouquet Ellipta refill  02/10/18 I placed refill online with [facility] for Breo Ellipta to release 02/23/18 order# [id number].Forde Radon

## 2018-03-14 ENCOUNTER — Other Ambulatory Visit: Payer: Self-pay | Admitting: Internal Medicine

## 2018-04-07 ENCOUNTER — Inpatient Hospital Stay
Admission: EM | Admit: 2018-04-07 | Discharge: 2018-04-09 | DRG: 193 | Disposition: A | Discharge status: Home / Self Care | Payer: Medicaid Other | Attending: Internal Medicine | Admitting: Internal Medicine

## 2018-04-07 ENCOUNTER — Telehealth: Payer: Self-pay | Admitting: Internal Medicine

## 2018-04-07 ENCOUNTER — Emergency Department: Payer: Medicaid Other

## 2018-04-07 ENCOUNTER — Other Ambulatory Visit: Payer: Self-pay

## 2018-04-07 ENCOUNTER — Encounter: Payer: Self-pay | Admitting: Emergency Medicine

## 2018-04-07 DIAGNOSIS — R64 Cachexia: Secondary | ICD-10-CM | POA: Diagnosis present

## 2018-04-07 DIAGNOSIS — Z79899 Other long term (current) drug therapy: Secondary | ICD-10-CM

## 2018-04-07 DIAGNOSIS — J441 Chronic obstructive pulmonary disease with (acute) exacerbation: Secondary | ICD-10-CM

## 2018-04-07 DIAGNOSIS — Z982 Presence of cerebrospinal fluid drainage device: Secondary | ICD-10-CM

## 2018-04-07 DIAGNOSIS — F17210 Nicotine dependence, cigarettes, uncomplicated: Secondary | ICD-10-CM | POA: Diagnosis present

## 2018-04-07 DIAGNOSIS — J189 Pneumonia, unspecified organism: Principal | ICD-10-CM | POA: Diagnosis present

## 2018-04-07 DIAGNOSIS — Z681 Body mass index (BMI) 19 or less, adult: Secondary | ICD-10-CM

## 2018-04-07 DIAGNOSIS — F329 Major depressive disorder, single episode, unspecified: Secondary | ICD-10-CM | POA: Diagnosis present

## 2018-04-07 DIAGNOSIS — Z716 Tobacco abuse counseling: Secondary | ICD-10-CM

## 2018-04-07 DIAGNOSIS — J440 Chronic obstructive pulmonary disease with acute lower respiratory infection: Secondary | ICD-10-CM | POA: Diagnosis present

## 2018-04-07 DIAGNOSIS — Z7951 Long term (current) use of inhaled steroids: Secondary | ICD-10-CM

## 2018-04-07 DIAGNOSIS — F419 Anxiety disorder, unspecified: Secondary | ICD-10-CM | POA: Diagnosis present

## 2018-04-07 DIAGNOSIS — K861 Other chronic pancreatitis: Secondary | ICD-10-CM | POA: Diagnosis present

## 2018-04-07 DIAGNOSIS — Z8249 Family history of ischemic heart disease and other diseases of the circulatory system: Secondary | ICD-10-CM

## 2018-04-07 DIAGNOSIS — J9611 Chronic respiratory failure with hypoxia: Secondary | ICD-10-CM

## 2018-04-07 DIAGNOSIS — Z885 Allergy status to narcotic agent status: Secondary | ICD-10-CM

## 2018-04-07 DIAGNOSIS — J9621 Acute and chronic respiratory failure with hypoxia: Secondary | ICD-10-CM | POA: Diagnosis present

## 2018-04-07 DIAGNOSIS — Z9981 Dependence on supplemental oxygen: Secondary | ICD-10-CM

## 2018-04-07 LAB — CBC
HEMATOCRIT: 47.1 % (ref 39.0–52.0)
HEMOGLOBIN: 15.3 g/dL (ref 13.0–17.0)
MCH: 30.4 pg (ref 26.0–34.0)
MCHC: 32.5 g/dL (ref 30.0–36.0)
MCV: 93.6 fL (ref 80.0–100.0)
Platelets: 323 10*3/uL (ref 150–400)
RBC: 5.03 MIL/uL (ref 4.22–5.81)
RDW: 12 % (ref 11.5–15.5)
WBC: 10.7 10*3/uL — AB (ref 4.0–10.5)
nRBC: 0 % (ref 0.0–0.2)

## 2018-04-07 LAB — TROPONIN I: Troponin I: <0.03 ng/mL (ref <0.03)

## 2018-04-07 LAB — BASIC METABOLIC PANEL
Anion gap: 7 (ref 5–15)
BUN: 16 mg/dL (ref 6–20)
CHLORIDE: 102 mmol/L (ref 98–111)
CO2: 28 mmol/L (ref 22–32)
Calcium: 8.9 mg/dL (ref 8.9–10.3)
Creatinine, Ser: 1.08 mg/dL (ref 0.61–1.24)
GFR calc Af Amer: >60 mL/min (ref >60)
GFR calc non Af Amer: >60 mL/min (ref >60)
Glucose, Bld: 115 mg/dL — ABNORMAL HIGH (ref 70–99)
POTASSIUM: 4.1 mmol/L (ref 3.5–5.1)
SODIUM: 137 mmol/L (ref 135–145)

## 2018-04-07 MED ORDER — SODIUM CHLORIDE 0.9 % IV BOLUS
1000.0000 mL | Freq: Once | INTRAVENOUS | Status: AC
  Administered 2018-04-07: 1000 mL via INTRAVENOUS

## 2018-04-07 MED ORDER — METHYLPREDNISOLONE SODIUM SUCC 125 MG IJ SOLR
125.0000 mg | Freq: Once | INTRAMUSCULAR | Status: AC
  Administered 2018-04-07: 125 mg via INTRAVENOUS
  Filled 2018-04-07: qty 2

## 2018-04-07 MED ORDER — ALBUTEROL SULFATE (2.5 MG/3ML) 0.083% IN NEBU
5.0000 mg | INHALATION_SOLUTION | Freq: Once | RESPIRATORY_TRACT | Status: AC
  Administered 2018-04-07: 5 mg via RESPIRATORY_TRACT
  Filled 2018-04-07: qty 6

## 2018-04-07 MED ORDER — SODIUM CHLORIDE 0.9 % IV SOLN
500.0000 mg | Freq: Once | INTRAVENOUS | Status: AC
  Administered 2018-04-08: 500 mg via INTRAVENOUS
  Filled 2018-04-07: qty 500

## 2018-04-07 MED ORDER — SODIUM CHLORIDE 0.9 % IV SOLN
2.0000 g | Freq: Once | INTRAVENOUS | Status: AC
  Administered 2018-04-08: 2 g via INTRAVENOUS
  Filled 2018-04-07: qty 20

## 2018-04-07 MED ORDER — SODIUM CHLORIDE 0.9 % IV BOLUS
1000.0000 mL | Freq: Once | INTRAVENOUS | Status: AC
  Administered 2018-04-08: 1000 mL via INTRAVENOUS

## 2018-04-07 MED ORDER — ROPINIROLE HCL 0.25 MG PO TABS
0.5000 mg | ORAL_TABLET | ORAL | Status: AC
  Administered 2018-04-08: 0.5 mg via ORAL
  Filled 2018-04-07 (×2): qty 2

## 2018-04-07 MED ORDER — IPRATROPIUM-ALBUTEROL 0.5-2.5 (3) MG/3ML IN SOLN
3.0000 mL | Freq: Once | RESPIRATORY_TRACT | Status: AC
  Administered 2018-04-07: 3 mL via RESPIRATORY_TRACT
  Filled 2018-04-07: qty 3

## 2018-04-07 MED ORDER — MAGNESIUM SULFATE 2 GM/50ML IV SOLN
2.0000 g | INTRAVENOUS | Status: AC
  Administered 2018-04-07: 2 g via INTRAVENOUS
  Filled 2018-04-07: qty 50

## 2018-04-07 MED ORDER — IOHEXOL 350 MG/ML SOLN
75.0000 mL | Freq: Once | INTRAVENOUS | Status: AC | PRN
  Administered 2018-04-07: 75 mL via INTRAVENOUS

## 2018-04-07 NOTE — Telephone Encounter (Signed)
Patient on the way to hospital to be seen for sob

## 2018-04-07 NOTE — ED Notes (Signed)
Patient transported to CT 

## 2018-04-07 NOTE — Telephone Encounter (Signed)
Noted DR is out of office until 04/11/18.

## 2018-04-07 NOTE — ED Provider Notes (Signed)
[facility] Emergency Department Provider Note  ____________________________________________  Time seen: Approximately 11:46 PM  I have reviewed the triage vital signs and the nursing notes.   HISTORY  Chief Complaint Shortness of Breath and Cough    HPI Austin Blevins is a 57 y.o. male with a history of COPD depression and malnutrition who complains of worsening of his chronic shortness of breath and productive cough.  This is been worsening over the past 2 to 3 days.  He is also having pleuritic central chest pain.  He is on chronic home oxygen.  Pain is constant, nonradiating, no alleviating factors, moderate intensity, sharp.  Denies fever chills sweats or weight loss.      Past Medical History:  Diagnosis Date  . Anxiety   . COPD (chronic obstructive pulmonary disease) (HCC)   . Depression   . Hydrocephalus [facility])      Patient Active Problem List   Diagnosis Date Noted  . Anxiety 06/22/2017  . Hx of smoking 05/25/2017  . Chronic pancreatitis (HCC) 05/25/2017  . Healthcare maintenance 02/11/2017  . COPD (chronic obstructive pulmonary disease) (HCC) 02/11/2017  . Protein-calorie malnutrition, severe 01/22/2017  . Pleural effusion, left 11/08/2014  . Depression, major, recurrent, moderate (HCC) 04/11/2014  . Malnutrition (HCC) 04/11/2014  . Smokers' cough (HCC) 03/26/2014     Past Surgical History:  Procedure Laterality Date  . BRAIN SURGERY  2003   VA shunt for hydrocephalus  . ventricular shunt       Prior to Admission medications   Medication Sig Start Date End Date Taking? Authorizing Provider  albuterol (PROVENTIL HFA;VENTOLIN HFA) 108 (90 Base) MCG/ACT inhaler Inhale 2 puffs into the lungs every 4 (four) hours as needed for wheezing or shortness of breath. 10/12/17  Yes Kallie Locks, FNP  albuterol (PROVENTIL) (2.5 MG/3ML) 0.083% nebulizer solution Take 3 mLs (2.5 mg total) by nebulization every 6 (six) hours as needed for  wheezing or shortness of breath. 10/21/17  Yes Doles-Johnson, Teah, NP  fluticasone furoate-vilanterol (BREO ELLIPTA) 200-25 MCG/INH AEPB Inhale 1 puff into the lungs daily. 09/09/17  Yes Tukov-Yual, Alroy Bailiff, NP  tiotropium (SPIRIVA) 18 MCG inhalation capsule Place 1 capsule (18 mcg total) into inhaler and inhale daily. 10/12/17  Yes Kallie Locks, FNP  docusate sodium (COLACE) 100 MG capsule Take 2 capsules (200 mg total) by mouth at bedtime. Patient not taking: Reported on 04/07/2018 08/12/17   Andreas Ohm, NP  mirtazapine (REMERON) 15 MG tablet Take 0.5 tablets (7.5 mg total) by mouth at bedtime. 10/12/17   Kallie Locks, FNP  Multiple Vitamins-Minerals (MULTIVITAMIN ADULT) TABS Take 1 tablet by mouth daily. Patient not taking: Reported on 12/21/2017 09/09/17   Andreas Ohm, NP  Nutritional Supplements (ENSURE HIGH PROTEIN) LIQD Take 1 Can by mouth 3 (three) times daily. Patient not taking: Reported on 04/07/2018 08/12/17   Tukov-Yual, Alroy Bailiff, NP  rOPINIRole (REQUIP) 0.5 MG tablet TAKE ONE TABLET BY MOUTH 3 TIMES A DAY Patient not taking: Reported on 04/07/2018 03/15/18   Virl Axe, MD  traZODone (DESYREL) 100 MG tablet Take 1 tablet (100 mg total) by mouth at bedtime. Patient not taking: Reported on 04/07/2018 10/12/17   Kallie Locks, FNP  varenicline (CHANTIX) 1 MG tablet Take 1 tablet (1 mg total) by mouth 2 (two) times daily. Patient not taking: Reported on 04/07/2018 12/30/17   Doles-Johnson, Teah, NP  Vitamin D, Ergocalciferol, (DRISDOL) 50000 units CAPS capsule Take 1 capsule (50,000 Units total)  by mouth every 7 (seven) days. Patient not taking: Reported on 12/21/2017 09/09/17   Andreas Ohmukov-Yual, Magdalene S, NP     Allergies Oxycodone   Family History  Problem Relation Age of Onset  . Diabetes Mellitus II Mother   . CAD Father   . Hypertension Father     Social History Social History   Tobacco Use  . Smoking status: Current Every Day Smoker     Packs/day: 1.00    Types: Cigarettes    Last attempt to quit: 06/17/2016    Years since quitting: 1.8  . Smokeless tobacco: Never Used  Substance Use Topics  . Alcohol use: No  . Drug use: No    Review of Systems  Constitutional:   No fever or chills.  ENT:   No sore throat. No rhinorrhea. Cardiovascular: Positive as above chest pain without syncope. Respiratory:   Positive shortness of breath and cough. Gastrointestinal:   Negative for abdominal pain, vomiting and diarrhea.  Musculoskeletal:   Negative for focal pain or swelling All other systems reviewed and are negative except as documented above in ROS and HPI.  ____________________________________________   PHYSICAL EXAM:  VITAL SIGNS: ED Triage Vitals [04/07/18 1652]  Enc Vitals Group     BP 124/71     Pulse Rate (!) 130     Resp 16     Temp 98.1 F (36.7 C)     Temp Source Oral     SpO2 93 %     Weight 115 lb (52.2 kg)     Height 5\' 8"  (1.727 m)     Head Circumference      Peak Flow      Pain Score 4     Pain Loc      Pain Edu?      Excl. in GC?     Vital signs reviewed, nursing assessments reviewed.   Constitutional:   Alert and oriented.  Ill-appearing.  Cachectic Eyes:   Conjunctivae are normal. EOMI. PERRL. ENT      Head:   Normocephalic and atraumatic.      Nose:   No congestion/rhinnorhea.       Mouth/Throat:   Dry mucous membranes, no pharyngeal erythema. No peritonsillar mass.       Neck:   No meningismus. Full ROM. Hematological/Lymphatic/Immunilogical:   No cervical lymphadenopathy. Cardiovascular:   Tachycardia heart rate 130. Symmetric bilateral radial and DP pulses.  No murmurs. Cap refill less than 2 seconds. Respiratory:   Normal respiratory effort without tachypnea/retractions. Breath sounds are clear and equal bilaterally. No wheezes/rales/rhonchi.  No inducible wheezing with FEV1 maneuver Gastrointestinal:   Soft and nontender. Non distended. There is no CVA tenderness.  No  rebound, rigidity, or guarding. Musculoskeletal:   Normal range of motion in all extremities. No joint effusions.  No lower extremity tenderness.  No edema. Neurologic:   Normal speech and language.  Motor grossly intact. No acute focal neurologic deficits are appreciated.  Skin:    Skin is warm, dry and intact. No rash noted.  No petechiae, purpura, or bullae.  ____________________________________________    LABS (pertinent positives/negatives) (all labs ordered are listed, but only abnormal results are displayed) Labs Reviewed  BASIC METABOLIC PANEL - Abnormal; Notable for the following components:      Result Value   Glucose, Bld 115 (*)    All other components within normal limits  CBC - Abnormal; Notable for the following components:   WBC 10.7 (*)    All other  components within normal limits  TROPONIN I   ____________________________________________   EKG  Interpreted by me Sinus tachycardia rate 128, normal axis intervals.  Poor R wave progression.  Normal ST segments and T waves.  ____________________________________________    RADIOLOGY  Dg Chest 2 View  Result Date: 04/07/2018 CLINICAL DATA:  Worsening shortness of breath and cough. EXAM: CHEST - 2 VIEW COMPARISON:  Multiple prior chest x-rays and chest CTs. FINDINGS: The cardiac silhouette, mediastinal and hilar contours are stable. There is a chronic right-sided central venous catheter again demonstrated with a small loop in it. Severe chronic lung disease with extensive pulmonary fibrosis, emphysema and chronic loculated bilateral fluid collections. No definite acute overlying pulmonary process. IMPRESSION: Severe chronic lung disease without definite acute overlying pulmonary process. Electronically Signed   By: Rudie Meyer M.D.   On: 04/07/2018 17:39   Ct Angio Chest Pe W And/or Wo Contrast  Result Date: 04/07/2018 CLINICAL DATA:  Chronic shortness of breath from COPD. Worsening cough and shortness of  breath over the last few days. Pain with cough. Home oxygen. EXAM: CT ANGIOGRAPHY CHEST WITH CONTRAST TECHNIQUE: Multidetector CT imaging of the chest was performed using the standard protocol during bolus administration of intravenous contrast. Multiplanar CT image reconstructions and MIPs were obtained to evaluate the vascular anatomy. CONTRAST:  75mL OMNIPAQUE IOHEXOL 350 MG/ML SOLN COMPARISON:  09/15/2017 FINDINGS: Cardiovascular: Good opacification of the central and segmental pulmonary arteries. No focal filling defects. No evidence of significant pulmonary embolus. Normal caliber thoracic aorta without aortic dissection. Great vessel origins are patent. Normal heart size. No pericardial effusion. Mediastinum/Nodes: Mild prominence of mediastinal lymph nodes with pretracheal lymph node measuring about 1.1 cm diameter, similar to previous study. Likely reactive. Esophagus is decompressed. Lungs/Pleura: Severe diffuse emphysematous changes in the lungs with bullous changes in the apices. Scattered fibrosis. Patchy airspace disease in the lower lungs likely representing superimposed pneumonia. Small left pleural effusion. No pneumothorax. Upper Abdomen: Calcifications in the pancreas likely representing chronic pancreatitis. Diffuse fatty infiltration of the liver. Celiac axis lymph nodes are mildly prominent, likely reactive and unchanged since prior study. Musculoskeletal: Degenerative changes in the spine. No destructive bone lesions. Review of the MIP images confirms the above findings. IMPRESSION: 1. No evidence of significant pulmonary embolus. 2. Severe diffuse emphysematous changes in the lungs with bullous changes in the apices. Patchy airspace disease in the lower lungs likely representing superimposed pneumonia. Small left pleural effusion. 3. Diffuse fatty infiltration of the liver. 4. Changes of chronic pancreatitis. Emphysema (ICD10-J43.9). Electronically Signed   By: Burman Nieves M.D.   On:  04/07/2018 23:10    ____________________________________________   PROCEDURES Procedures  ____________________________________________  DIFFERENTIAL DIAGNOSIS   COPD exacerbation, pneumonia, pleural effusion, pulmonary embolism  CLINICAL IMPRESSION / ASSESSMENT AND PLAN / ED COURSE  Pertinent labs & imaging results that were available during my care of the patient were reviewed by me and considered in my medical decision making (see chart for details).    Patient presents with pleuritic chest pain.  Surprisingly clear lungs on exam, markedly tachycardic.  This raise concern for pulmonary embolism so obtain a CT angiogram which thankfully was negative for PE but did show basilar pneumonia.  Given his severe underlying lung disease, recommended hospitalization to which the patient agreed.      ____________________________________________   FINAL CLINICAL IMPRESSION(S) / ED DIAGNOSES    Final diagnoses:  Community acquired pneumonia, unspecified laterality  COPD exacerbation (HCC)  Chronic respiratory failure with hypoxia (HCC)  ED Discharge Orders    None      Portions of this note were generated with dragon dictation software. Dictation errors may occur despite best attempts at proofreading.    Sharman Cheek, MD 04/07/18 2351

## 2018-04-07 NOTE — ED Triage Notes (Signed)
Patient reports chronic SOB from COPD. States over the last few days he has had a worsening cough and SOB. Reports pain in chest with coughing. Patient is supposed to wear 4L oxygen chronically but states it gives him a headache so he normally wears 1.5L. Patient 93% on 1.5L.

## 2018-04-08 DIAGNOSIS — J9621 Acute and chronic respiratory failure with hypoxia: Secondary | ICD-10-CM | POA: Diagnosis present

## 2018-04-08 DIAGNOSIS — Z885 Allergy status to narcotic agent status: Secondary | ICD-10-CM | POA: Diagnosis not present

## 2018-04-08 DIAGNOSIS — Z79899 Other long term (current) drug therapy: Secondary | ICD-10-CM | POA: Diagnosis not present

## 2018-04-08 DIAGNOSIS — Z982 Presence of cerebrospinal fluid drainage device: Secondary | ICD-10-CM | POA: Diagnosis not present

## 2018-04-08 DIAGNOSIS — Z9981 Dependence on supplemental oxygen: Secondary | ICD-10-CM | POA: Diagnosis not present

## 2018-04-08 DIAGNOSIS — Z716 Tobacco abuse counseling: Secondary | ICD-10-CM | POA: Diagnosis not present

## 2018-04-08 DIAGNOSIS — J441 Chronic obstructive pulmonary disease with (acute) exacerbation: Secondary | ICD-10-CM | POA: Diagnosis not present

## 2018-04-08 DIAGNOSIS — K861 Other chronic pancreatitis: Secondary | ICD-10-CM | POA: Diagnosis present

## 2018-04-08 DIAGNOSIS — Z8249 Family history of ischemic heart disease and other diseases of the circulatory system: Secondary | ICD-10-CM | POA: Diagnosis not present

## 2018-04-08 DIAGNOSIS — R64 Cachexia: Secondary | ICD-10-CM | POA: Diagnosis present

## 2018-04-08 DIAGNOSIS — F17210 Nicotine dependence, cigarettes, uncomplicated: Secondary | ICD-10-CM | POA: Diagnosis present

## 2018-04-08 DIAGNOSIS — Z7951 Long term (current) use of inhaled steroids: Secondary | ICD-10-CM | POA: Diagnosis not present

## 2018-04-08 DIAGNOSIS — J440 Chronic obstructive pulmonary disease with acute lower respiratory infection: Secondary | ICD-10-CM | POA: Diagnosis present

## 2018-04-08 DIAGNOSIS — Z681 Body mass index (BMI) 19 or less, adult: Secondary | ICD-10-CM | POA: Diagnosis not present

## 2018-04-08 DIAGNOSIS — J189 Pneumonia, unspecified organism: Secondary | ICD-10-CM | POA: Diagnosis not present

## 2018-04-08 DIAGNOSIS — F329 Major depressive disorder, single episode, unspecified: Secondary | ICD-10-CM | POA: Diagnosis present

## 2018-04-08 DIAGNOSIS — F419 Anxiety disorder, unspecified: Secondary | ICD-10-CM | POA: Diagnosis present

## 2018-04-08 LAB — CBC
HEMATOCRIT: 41.8 % (ref 39.0–52.0)
HEMOGLOBIN: 13 g/dL (ref 13.0–17.0)
MCH: 29.9 pg (ref 26.0–34.0)
MCHC: 31.1 g/dL (ref 30.0–36.0)
MCV: 96.1 fL (ref 80.0–100.0)
Platelets: 275 10*3/uL (ref 150–400)
RBC: 4.35 MIL/uL (ref 4.22–5.81)
RDW: 11.9 % (ref 11.5–15.5)
WBC: 8.2 10*3/uL (ref 4.0–10.5)
nRBC: 0 % (ref 0.0–0.2)

## 2018-04-08 LAB — HM HEPATITIS C SCREENING LAB: HM Hepatitis Screen: NEGATIVE

## 2018-04-08 LAB — BASIC METABOLIC PANEL
ANION GAP: 4 — AB (ref 5–15)
BUN: 19 mg/dL (ref 6–20)
CHLORIDE: 109 mmol/L (ref 98–111)
CO2: 27 mmol/L (ref 22–32)
Calcium: 8.2 mg/dL — ABNORMAL LOW (ref 8.9–10.3)
Creatinine, Ser: 1.24 mg/dL (ref 0.61–1.24)
GFR calc Af Amer: >60 mL/min (ref >60)
Glucose, Bld: 220 mg/dL — ABNORMAL HIGH (ref 70–99)
POTASSIUM: 4.4 mmol/L (ref 3.5–5.1)
SODIUM: 140 mmol/L (ref 135–145)

## 2018-04-08 LAB — GLUCOSE, CAPILLARY: GLUCOSE-CAPILLARY: 171 mg/dL — AB (ref 70–99)

## 2018-04-08 MED ORDER — DOCUSATE SODIUM 100 MG PO CAPS
100.0000 mg | ORAL_CAPSULE | Freq: Two times a day (BID) | ORAL | Status: DC
  Administered 2018-04-08 (×3): 100 mg via ORAL
  Filled 2018-04-08 (×4): qty 1

## 2018-04-08 MED ORDER — SODIUM CHLORIDE 0.9 % IV SOLN
500.0000 mg | INTRAVENOUS | Status: DC
  Filled 2018-04-08: qty 500

## 2018-04-08 MED ORDER — ENSURE ENLIVE PO LIQD
237.0000 mL | Freq: Three times a day (TID) | ORAL | Status: DC
  Administered 2018-04-08: 237 mL via ORAL

## 2018-04-08 MED ORDER — ACETAMINOPHEN 325 MG PO TABS: 650.0000 mg | ORAL_TABLET | Freq: Four times a day (QID) | ORAL | Status: DC | PRN

## 2018-04-08 MED ORDER — HEPARIN SODIUM (PORCINE) 5000 UNIT/ML IJ SOLN
5000.0000 [IU] | Freq: Three times a day (TID) | INTRAMUSCULAR | Status: DC
  Administered 2018-04-08 – 2018-04-09 (×4): 5000 [IU] via SUBCUTANEOUS
  Filled 2018-04-08 (×4): qty 1

## 2018-04-08 MED ORDER — ADULT MULTIVITAMIN W/MINERALS CH
1.0000 | ORAL_TABLET | Freq: Every day | ORAL | Status: DC
  Administered 2018-04-08 – 2018-04-09 (×2): 1 via ORAL
  Filled 2018-04-08 (×2): qty 1

## 2018-04-08 MED ORDER — BISACODYL 5 MG PO TBEC: 5.0000 mg | DELAYED_RELEASE_TABLET | Freq: Every day | ORAL | Status: DC | PRN

## 2018-04-08 MED ORDER — TIOTROPIUM BROMIDE MONOHYDRATE 18 MCG IN CAPS
18.0000 ug | ORAL_CAPSULE | Freq: Every day | RESPIRATORY_TRACT | Status: DC
  Administered 2018-04-08 – 2018-04-09 (×2): 18 ug via RESPIRATORY_TRACT
  Filled 2018-04-08: qty 5

## 2018-04-08 MED ORDER — HYDROCODONE-ACETAMINOPHEN 5-325 MG PO TABS: 1.0000 | ORAL_TABLET | ORAL | Status: DC | PRN

## 2018-04-08 MED ORDER — ROPINIROLE HCL 0.25 MG PO TABS
0.5000 mg | ORAL_TABLET | Freq: Three times a day (TID) | ORAL | Status: DC
  Administered 2018-04-08 – 2018-04-09 (×4): 0.5 mg via ORAL
  Filled 2018-04-08 (×6): qty 2

## 2018-04-08 MED ORDER — AZITHROMYCIN 250 MG PO TABS
250.0000 mg | ORAL_TABLET | Freq: Every day | ORAL | Status: DC
  Administered 2018-04-08 – 2018-04-09 (×2): 250 mg via ORAL
  Filled 2018-04-08 (×2): qty 1

## 2018-04-08 MED ORDER — GUAIFENESIN ER 600 MG PO TB12
1200.0000 mg | ORAL_TABLET | Freq: Two times a day (BID) | ORAL | Status: DC | PRN
  Administered 2018-04-08: 1200 mg via ORAL
  Filled 2018-04-08: qty 2

## 2018-04-08 MED ORDER — ONDANSETRON HCL 4 MG PO TABS: 4.0000 mg | ORAL_TABLET | Freq: Four times a day (QID) | ORAL | Status: DC | PRN

## 2018-04-08 MED ORDER — VITAMIN D (ERGOCALCIFEROL) 1.25 MG (50000 UNIT) PO CAPS: 50000.0000 [IU] | ORAL_CAPSULE | ORAL | Status: DC

## 2018-04-08 MED ORDER — MIRTAZAPINE 15 MG PO TABS
7.5000 mg | ORAL_TABLET | Freq: Every day | ORAL | Status: DC
  Administered 2018-04-08 (×2): 7.5 mg via ORAL
  Filled 2018-04-08 (×2): qty 1

## 2018-04-08 MED ORDER — SODIUM CHLORIDE 0.9 % IV SOLN
Freq: Once | INTRAVENOUS | Status: AC
  Administered 2018-04-08: 02:00:00 via INTRAVENOUS

## 2018-04-08 MED ORDER — BOOST / RESOURCE BREEZE PO LIQD CUSTOM
1.0000 | Freq: Three times a day (TID) | ORAL | Status: DC
  Administered 2018-04-08: 1 via ORAL

## 2018-04-08 MED ORDER — IPRATROPIUM-ALBUTEROL 0.5-2.5 (3) MG/3ML IN SOLN
3.0000 mL | Freq: Four times a day (QID) | RESPIRATORY_TRACT | Status: DC
  Administered 2018-04-08 – 2018-04-09 (×5): 3 mL via RESPIRATORY_TRACT
  Filled 2018-04-08 (×4): qty 3

## 2018-04-08 MED ORDER — BENZONATATE 100 MG PO CAPS
200.0000 mg | ORAL_CAPSULE | Freq: Three times a day (TID) | ORAL | Status: DC | PRN
  Administered 2018-04-08 (×2): 200 mg via ORAL
  Filled 2018-04-08 (×2): qty 2

## 2018-04-08 MED ORDER — SODIUM CHLORIDE 0.9 % IV SOLN
1.0000 g | INTRAVENOUS | Status: DC
  Administered 2018-04-08: 1 g via INTRAVENOUS
  Filled 2018-04-08: qty 1
  Filled 2018-04-08: qty 10

## 2018-04-08 MED ORDER — ONDANSETRON HCL 4 MG/2ML IJ SOLN: 4.0000 mg | Freq: Four times a day (QID) | INTRAMUSCULAR | Status: DC | PRN

## 2018-04-08 MED ORDER — FLUTICASONE FUROATE-VILANTEROL 200-25 MCG/INH IN AEPB
1.0000 | INHALATION_SPRAY | Freq: Every day | RESPIRATORY_TRACT | Status: DC
  Administered 2018-04-08 – 2018-04-09 (×2): 1 via RESPIRATORY_TRACT
  Filled 2018-04-08: qty 28

## 2018-04-08 MED ORDER — ACETAMINOPHEN 650 MG RE SUPP: 650.0000 mg | Freq: Four times a day (QID) | RECTAL | Status: DC | PRN

## 2018-04-08 NOTE — Care Management Note (Signed)
Case Management Note  Patient Details  Name: Austin Blevins MRN: [id number] Date of Birth: 06-17-1960  Subjective/Objective:                 Patient is followed by [facility] and in good standing.  Has home oxygen through [employer]- 4 liters -  under charity.  Has home nebulizer.   Family will transport home at discharge.      Action/Plan:  Care  team to notify weekend CM if discharges home over weekend. If discharges over the weekend  assess for new meds that may be needed before the Clinic opens again on Monday  Expected Discharge Date:                  Expected Discharge Plan:     In-House Referral:     Discharge planning Services     Post Acute Care Choice:    Choice offered to:     DME Arranged:    DME Agency:     HH Arranged:    HH Agency:     Status of Service:     If discussed at [employer] Meetings, dates discussed:    Additional Comments:  Austin Blevins, Austin Newstrom R, RN 04/08/2018, 10:26 AM

## 2018-04-08 NOTE — Plan of Care (Signed)
  Problem: Education: Goal: Knowledge of General Education information will improve Description Including pain rating scale, medication(s)/side effects and non-pharmacologic comfort measures Outcome: Progressing   Problem: Health Behavior/Discharge Planning: Goal: Ability to manage health-related needs will improve Outcome: Progressing   Problem: Clinical Measurements: Goal: Ability to maintain clinical measurements within normal limits will improve Outcome: Progressing Goal: Will remain free from infection Outcome: Progressing Goal: Respiratory complications will improve Outcome: Progressing Goal: Cardiovascular complication will be avoided Outcome: Progressing   Problem: Activity: Goal: Risk for activity intolerance will decrease Outcome: Progressing   Problem: Pain Managment: Goal: General experience of comfort will improve Outcome: Progressing

## 2018-04-08 NOTE — Progress Notes (Signed)
[facility] [place] [facility]   PATIENT NAME: Austin Blevins    MR#:  [id number]  DATE OF BIRTH:  [birth year]  SUBJECTIVE:   Came in with increasing shortness of breath and productive cough. Found to have pneumonia. Not wheezing. Feels weak. Mother in the room. REVIEW OF SYSTEMS:   Review of Systems  Constitutional: Negative for chills, fever and weight loss.  HENT: Negative for ear discharge, ear pain and nosebleeds.   Eyes: Negative for blurred vision, pain and discharge.  Respiratory: Positive for sputum production and shortness of breath. Negative for stridor.   Cardiovascular: Negative for chest pain, palpitations, orthopnea and PND.  Gastrointestinal: Negative for abdominal pain, diarrhea, nausea and vomiting.  Genitourinary: Negative for frequency and urgency.  Musculoskeletal: Negative for back pain and joint pain.  Neurological: Positive for weakness. Negative for sensory change, speech change and focal weakness.  Psychiatric/Behavioral: Negative for depression and hallucinations. The patient is not nervous/anxious.    Tolerating Diet:yes Tolerating PT: ambulatory at home  DRUG ALLERGIES:   Allergies  Allergen Reactions  . Percocet [Oxycodone-Acetaminophen]     VITALS:  Blood pressure 107/64, pulse 90, temperature 98.5 F (36.9 C), temperature source Oral, resp. rate (!) 22, height 5\' 8"  (1.727 m), weight 55.5 kg, SpO2 95 %.  PHYSICAL EXAMINATION:   Physical Exam  GENERAL:  57 y.o.-year-old patient lying in the bed with no acute distress. Thin cachectic, looks older than his stated age EYES: Pupils equal, round, reactive to light and accommodation. No scleral icterus. Extraocular muscles intact.  HEENT: Head atraumatic, normocephalic. Oropharynx and nasopharynx clear.  NECK:  Supple, no jugular venous distention. No thyroid enlargement, no tenderness.  LUNGS distant breath sounds bilaterally, no wheezing, rales, rhonchi. No use of  accessory muscles of respiration.  CARDIOVASCULAR: S1, S2 normal. No murmurs, rubs, or gallops.  ABDOMEN: Soft, nontender, nondistended. Bowel sounds present. No organomegaly or mass.  EXTREMITIES: No cyanosis, clubbing or edema b/l.    NEUROLOGIC: Cranial nerves II through XII are intact. No focal Motor or sensory deficits b/l.   PSYCHIATRIC:  patient is alert and oriented x 3.  SKIN: No obvious rash, lesion, or ulcer.   LABORATORY PANEL:  CBC Recent Labs  Lab 04/08/18 0448  WBC 8.2  HGB 13.0  HCT 41.8  PLT 275    Chemistries  Recent Labs  Lab 04/08/18 0448  NA 140  K 4.4  CL 109  CO2 27  GLUCOSE 220*  BUN 19  CREATININE 1.24  CALCIUM 8.2*   Cardiac Enzymes Recent Labs  Lab 04/07/18 1706  TROPONINI <0.03   RADIOLOGY:  Dg Chest 2 View  Result Date: 04/07/2018 CLINICAL DATA:  Worsening shortness of breath and cough. EXAM: CHEST - 2 VIEW COMPARISON:  Multiple prior chest x-rays and chest CTs. FINDINGS: The cardiac silhouette, mediastinal and hilar contours are stable. There is a chronic right-sided central venous catheter again demonstrated with a small loop in it. Severe chronic lung disease with extensive pulmonary fibrosis, emphysema and chronic loculated bilateral fluid collections. No definite acute overlying pulmonary process. IMPRESSION: Severe chronic lung disease without definite acute overlying pulmonary process. Electronically Signed   By: Rudie MeyerP.  Gallerani M.D.   On: 04/07/2018 17:39   Ct Angio Chest Pe W And/or Wo Contrast  Result Date: 04/07/2018 CLINICAL DATA:  Chronic shortness of breath from COPD. Worsening cough and shortness of breath over the last few days. Pain with cough. Home oxygen. EXAM: CT ANGIOGRAPHY CHEST WITH CONTRAST TECHNIQUE: Multidetector  CT imaging of the chest was performed using the standard protocol during bolus administration of intravenous contrast. Multiplanar CT image reconstructions and MIPs were obtained to evaluate the vascular  anatomy. CONTRAST:  75mL OMNIPAQUE IOHEXOL 350 MG/ML SOLN COMPARISON:  09/15/2017 FINDINGS: Cardiovascular: Good opacification of the central and segmental pulmonary arteries. No focal filling defects. No evidence of significant pulmonary embolus. Normal caliber thoracic aorta without aortic dissection. Great vessel origins are patent. Normal heart size. No pericardial effusion. Mediastinum/Nodes: Mild prominence of mediastinal lymph nodes with pretracheal lymph node measuring about 1.1 cm diameter, similar to previous study. Likely reactive. Esophagus is decompressed. Lungs/Pleura: Severe diffuse emphysematous changes in the lungs with bullous changes in the apices. Scattered fibrosis. Patchy airspace disease in the lower lungs likely representing superimposed pneumonia. Small left pleural effusion. No pneumothorax. Upper Abdomen: Calcifications in the pancreas likely representing chronic pancreatitis. Diffuse fatty infiltration of the liver. Celiac axis lymph nodes are mildly prominent, likely reactive and unchanged since prior study. Musculoskeletal: Degenerative changes in the spine. No destructive bone lesions. Review of the MIP images confirms the above findings. IMPRESSION: 1. No evidence of significant pulmonary embolus. 2. Severe diffuse emphysematous changes in the lungs with bullous changes in the apices. Patchy airspace disease in the lower lungs likely representing superimposed pneumonia. Small left pleural effusion. 3. Diffuse fatty infiltration of the liver. 4. Changes of chronic pancreatitis. Emphysema (ICD10-J43.9). Electronically Signed   By: Burman Nieves M.D.   On: 04/07/2018 23:10   ASSESSMENT AND PLAN:  Austin Blevins  is a 57 y.o. male with a known history of tobacco abuse, COPD on chronic home oxygen and anxiety disorder. Patient presented to emergency room for worsening chronic shortness of breath and productive cough with yellow sputum, going on for the past 3 to 4 days, gradually  getting worse.  Patient also has occasional central chest pain with cough and deep breath.  No reports of fever, chills, bleeding.  1.  Acute on chronic respiratory failure with hypoxia, secondary to acute COPD exacerbation and pneumonia.   -cont  IV antibiotics, oxygen, nebulizer, cough medication.  Continue to monitor clinically closely. -not wheezing. Received high dose of IV steroid  2.  CAP, will start treatment with IV azithromycin and ceftriaxone.  3.  Acute COPD exacerbation, see treatment as above under #1.  4.  Tobacco abuse.  Smoking cessation was discussed with patient in detail--pt does not want to quit  D/w pt and mother  Case discussed with Care Management/Social Worker. Management plans discussed with the patient, family and they are in agreement.  CODE STATUS: full  DVT Prophylaxis: lovenox  TOTAL TIME TAKING CARE OF THIS PATIENT: *30 minutes.  >50% time spent on counselling and coordination of care  POSSIBLE D/C IN *1-2* DAYS, DEPENDING ON CLINICAL CONDITION.  Note: This dictation was prepared with Dragon dictation along with smaller phrase technology. Any transcriptional errors that result from this process are unintentional.  Enedina Finner M.D on 04/08/2018 at 3:25 PM  Between 7am to 6pm - Pager - [phone]  After 6pm go to [email or web address] - Social research officer, government  [facility]  Office  [phone]  CC: Primary care physician; Lauro Regulus, MDPatient ID: Austin Blevins, male   DOB: 1961-03-28, 57 y.o.   MRN: [id number]

## 2018-04-08 NOTE — Progress Notes (Signed)
Initial Nutrition Assessment  DOCUMENTATION CODES:   Severe malnutrition in context of chronic illness, Underweight  INTERVENTION:  Replace ensure with Boost Breeze for patient preference Boost Breeze po TID, each supplement provides 250 kcal and 9 grams of protein Magic cup TID with meals, each supplement provides 290 kcal and 9 grams of protein MVI   NUTRITION DIAGNOSIS:   Severe Malnutrition related to chronic illness(COPD) as evidenced by severe fat depletion, severe muscle depletion, energy intake < 75% for > or equal to 3 months, per patient/family report.   GOAL:   Patient will meet greater than or equal to 90% of their needs   MONITOR:   PO intake, Supplement acceptance, Skin, Weight trends, Labs  REASON FOR ASSESSMENT:   Malnutrition Screening Tool    ASSESSMENT:  57 year old male with known history of COPD on home O2, anxiety disorder, depression, ventricular shunt for hydrocephalus who presented to ED for increased SOB and productive cough with yellow sputum x 3-4 days; admitted with community acquired pneumonia.   Pt finishing with breathing treatment at time of visit and mother at bedside. Pt reports poor PO since COPD diagnosis 3 years ago. Patient recalls eating 1 meal/day and usually having a peanut butter sandwich with a glass of whole fat milk. Occasionally he will have a bowl of cereal or soup. Pt has poor dentition and reports chewing difficulties. Pt agreeable to dysphagia 3 diet modification to allow for increased PO during stay. Pt does not care for the ensure and interested in Boost Breeze ONS.   Pt recalls UBW 120lbs consistent for the past 2 years and prior to COPD recalls UBW 160lbs.  Medications: colace, remeron, MVI, vit D Labs: Glucose 220 (H) Ca 8.2 (L) - w/out albumin for correction   NUTRITION - FOCUSED PHYSICAL EXAM:    Most Recent Value  Orbital Region  Severe depletion  Upper Arm Region  Moderate depletion  Thoracic and Lumbar  Region  Moderate depletion  Buccal Region  Severe depletion  Temple Region  Severe depletion  Clavicle Bone Region  Severe depletion  Clavicle and Acromion Bone Region  Moderate depletion  Scapular Bone Region  Moderate depletion  Dorsal Hand  Moderate depletion  Patellar Region  Moderate depletion  Anterior Thigh Region  Moderate depletion  Posterior Calf Region  Moderate depletion  Edema (RD Assessment)  None  Hair  Reviewed  Eyes  Reviewed  Mouth  Reviewed [poor dentition]  Skin  Reviewed  Nails  Reviewed      Diet Order:   Diet Order            Diet Heart Room service appropriate? Yes; Fluid consistency: Thin  Diet effective now              EDUCATION NEEDS:   Education needs have been addressed  Skin:  Skin Assessment: Reviewed RN Assessment  Last BM:  04/07/2018; WDL  Height:   Ht Readings from Last 1 Encounters:  04/07/18 5\' 8"  (1.727 m)    Weight:   Wt Readings from Last 1 Encounters:  04/08/18 55.5 kg    Ideal Body Weight:  70 kg  BMI:  Body mass index is 18.61 kg/m.  Estimated Nutritional Needs:   Kcal:  1700-1900  Protein:  67-83g/day  Fluid:  1.7-1.9L    Lars MassonSuzanne Beverely Suen, RD, LDN  After Hours/Weekend Pager: [phone]

## 2018-04-08 NOTE — H&P (Signed)
[facility] -  at [facility]   PATIENT NAME: Austin Blevins    MR#:  [id number]  DATE OF BIRTH:  April 19, 1961  DATE OF ADMISSION:  04/07/2018  PRIMARY CARE PHYSICIAN: Lauro Regulus, MD   REQUESTING/REFERRING PHYSICIAN:   CHIEF COMPLAINT:   Chief Complaint  Patient presents with  . Shortness of Breath  . Cough    HISTORY OF PRESENT ILLNESS: Austin Blevins  is a 57 y.o. male with a known history of tobacco abuse, COPD on chronic home oxygen and anxiety disorder. Patient presented to emergency room for worsening chronic shortness of breath and productive cough with yellow sputum, going on for the past 3 to 4 days, gradually getting worse.  Patient also has occasional central chest pain with cough and deep breath.  No reports of fever, chills, bleeding. At the arrival to emergency room patient was noted with tachycardia, tachypnea and oxygen saturation at 93% on 1.5 L oxygen per nasal cannula. Blood test done emergency room are notable for elevated WBC at 10.7. EKG shows sinus tachycardia with heart rate of 128 bpm, no acute ST-T changes. CTA of the chest shows no evidence of significant pulmonary embolus. Severe diffuse emphysematous changes are seen in the lungs with bullous changes in the apices.  There are patchy airspace disease in the lower lungs likely representing superimposed pneumonia. Small left pleural effusion noted.  She is admitted for further evaluation and treatment.  PAST MEDICAL HISTORY:   Past Medical History:  Diagnosis Date  . Anxiety   . COPD (chronic obstructive pulmonary disease) (HCC)   . Depression   . Hydrocephalus (HCC)     PAST SURGICAL HISTORY:  Past Surgical History:  Procedure Laterality Date  . BRAIN SURGERY  2003   VA shunt for hydrocephalus  . ventricular shunt      SOCIAL HISTORY:  Social History   Tobacco Use  . Smoking status: Current Every Day Smoker    Packs/day: 1.00    Types: Cigarettes    Last  attempt to quit: 06/17/2016    Years since quitting: 1.8  . Smokeless tobacco: Never Used  Substance Use Topics  . Alcohol use: No    FAMILY HISTORY:  Family History  Problem Relation Age of Onset  . Diabetes Mellitus II Mother   . CAD Father   . Hypertension Father     DRUG ALLERGIES:  Allergies  Allergen Reactions  . Percocet [Oxycodone-Acetaminophen]     REVIEW OF SYSTEMS:   CONSTITUTIONAL: No fever, but positive for fatigue and generalized weakness.  EYES: No changes in vision.  EARS, NOSE, AND THROAT: No tinnitus or ear pain.  RESPIRATORY: Positive for productive cough, shortness of breath, wheezing.no hemoptysis.  CARDIOVASCULAR: Positive for chest pain with coughing.  No orthopnea, edema.  GASTROINTESTINAL: No nausea, vomiting, diarrhea or abdominal pain.  GENITOURINARY: No dysuria, hematuria.  ENDOCRINE: No polyuria, nocturia. HEMATOLOGY: No bleeding. SKIN: No rash or lesion. MUSCULOSKELETAL: No joint pain at this time.   NEUROLOGIC: No focal weakness.  PSYCHIATRY: Positive for anxiety disorder.   MEDICATIONS AT HOME:  Prior to Admission medications   Medication Sig Start Date End Date Taking? Authorizing Provider  albuterol (PROVENTIL HFA;VENTOLIN HFA) 108 (90 Base) MCG/ACT inhaler Inhale 2 puffs into the lungs every 4 (four) hours as needed for wheezing or shortness of breath. 10/12/17  Yes Kallie Locks, FNP  albuterol (PROVENTIL) (2.5 MG/3ML) 0.083% nebulizer solution Take 3 mLs (2.5 mg total) by nebulization every 6 (six) hours  as needed for wheezing or shortness of breath. 10/21/17  Yes Doles-Johnson, Teah, NP  fluticasone furoate-vilanterol (BREO ELLIPTA) 200-25 MCG/INH AEPB Inhale 1 puff into the lungs daily. 09/09/17  Yes Tukov-Yual, Magdalene S, NP  mirtazapine (REMERON) 15 MG tablet Take 0.5 tablets (7.5 mg total) by mouth at bedtime. 10/12/17  Yes Kallie Locks, FNP  Multiple Vitamins-Minerals (MULTIVITAMIN ADULT) TABS Take 1 tablet by mouth daily.  09/09/17  Yes Tukov-Yual, Alroy Bailiff, NP  Nutritional Supplements (ENSURE HIGH PROTEIN) LIQD Take 1 Can by mouth 3 (three) times daily. 08/12/17  Yes Tukov-Yual, Magdalene S, NP  rOPINIRole (REQUIP) 0.5 MG tablet TAKE ONE TABLET BY MOUTH 3 TIMES A DAY 03/15/18  Yes Virl Axe, MD  tiotropium (SPIRIVA) 18 MCG inhalation capsule Place 1 capsule (18 mcg total) into inhaler and inhale daily. 10/12/17  Yes Kallie Locks, FNP  Vitamin D, Ergocalciferol, (DRISDOL) 50000 units CAPS capsule Take 1 capsule (50,000 Units total) by mouth every 7 (seven) days. 09/09/17  Yes Tukov-Yual, Alroy Bailiff, NP  docusate sodium (COLACE) 100 MG capsule Take 2 capsules (200 mg total) by mouth at bedtime. Patient not taking: Reported on 04/07/2018 08/12/17   Andreas Ohm, NP  traZODone (DESYREL) 100 MG tablet Take 1 tablet (100 mg total) by mouth at bedtime. Patient not taking: Reported on 04/07/2018 10/12/17   Kallie Locks, FNP  varenicline (CHANTIX) 1 MG tablet Take 1 tablet (1 mg total) by mouth 2 (two) times daily. Patient not taking: Reported on 04/07/2018 12/30/17   Doles-Johnson, Teah, NP      PHYSICAL EXAMINATION:   VITAL SIGNS: Blood pressure 116/74, pulse (!) 106, temperature 98.1 F (36.7 C), resp. rate (!) 34, height 5\' 8"  (1.727 m), weight 52.2 kg, SpO2 95 %.  GENERAL:  57 y.o.-year-old patient lying in the bed with moderate distress, secondary to productive cough.  EYES: Pupils equal, round, reactive to light and accommodation. No scleral icterus. Extraocular muscles intact.  HEENT: Head atraumatic, normocephalic. Oropharynx and nasopharynx clear.  NECK:  Supple, no jugular venous distention. No thyroid enlargement, no tenderness.  LUNGS: Reduced breath sounds bilaterally, no wheezing, rales,rhonchi or crepitation. No use of accessory muscles of respiration at this time.  CARDIOVASCULAR: S1, S2 normal. No S3/S4.  ABDOMEN: Soft, nontender, nondistended. Bowel sounds present. No  organomegaly or mass.  EXTREMITIES: No pedal edema, cyanosis, or clubbing.  NEUROLOGIC: No focal weakness. PSYCHIATRIC: The patient is alert and oriented x 3.  SKIN: No obvious rash, lesion, or ulcer.   LABORATORY PANEL:   CBC Recent Labs  Lab 04/07/18 1706  WBC 10.7*  HGB 15.3  HCT 47.1  PLT 323  MCV 93.6  MCH 30.4  MCHC 32.5  RDW 12.0   ------------------------------------------------------------------------------------------------------------------  Chemistries  Recent Labs  Lab 04/07/18 1706  NA 137  K 4.1  CL 102  CO2 28  GLUCOSE 115*  BUN 16  CREATININE 1.08  CALCIUM 8.9   ------------------------------------------------------------------------------------------------------------------ estimated creatinine clearance is 56.4 mL/min (by C-G formula based on SCr of 1.08 mg/dL). ------------------------------------------------------------------------------------------------------------------ No results for input(s): TSH, T4TOTAL, T3FREE, THYROIDAB in the last 72 hours.  Invalid input(s): FREET3   Coagulation profile No results for input(s): INR, PROTIME in the last 168 hours. ------------------------------------------------------------------------------------------------------------------- No results for input(s): DDIMER in the last 72 hours. -------------------------------------------------------------------------------------------------------------------  Cardiac Enzymes Recent Labs  Lab 04/07/18 1706  TROPONINI <0.03   ------------------------------------------------------------------------------------------------------------------ Invalid input(s): POCBNP  ---------------------------------------------------------------------------------------------------------------  Urinalysis No results found for: COLORURINE, APPEARANCEUR, LABSPEC, PHURINE, GLUCOSEU, HGBUR, BILIRUBINUR, KETONESUR, PROTEINUR,  UROBILINOGEN, NITRITE, LEUKOCYTESUR   RADIOLOGY: Dg  Chest 2 View  Result Date: 04/07/2018 CLINICAL DATA:  Worsening shortness of breath and cough. EXAM: CHEST - 2 VIEW COMPARISON:  Multiple prior chest x-rays and chest CTs. FINDINGS: The cardiac silhouette, mediastinal and hilar contours are stable. There is a chronic right-sided central venous catheter again demonstrated with a small loop in it. Severe chronic lung disease with extensive pulmonary fibrosis, emphysema and chronic loculated bilateral fluid collections. No definite acute overlying pulmonary process. IMPRESSION: Severe chronic lung disease without definite acute overlying pulmonary process. Electronically Signed   By: Rudie MeyerP.  Gallerani M.D.   On: 04/07/2018 17:39   Ct Angio Chest Pe W And/or Wo Contrast  Result Date: 04/07/2018 CLINICAL DATA:  Chronic shortness of breath from COPD. Worsening cough and shortness of breath over the last few days. Pain with cough. Home oxygen. EXAM: CT ANGIOGRAPHY CHEST WITH CONTRAST TECHNIQUE: Multidetector CT imaging of the chest was performed using the standard protocol during bolus administration of intravenous contrast. Multiplanar CT image reconstructions and MIPs were obtained to evaluate the vascular anatomy. CONTRAST:  75mL OMNIPAQUE IOHEXOL 350 MG/ML SOLN COMPARISON:  09/15/2017 FINDINGS: Cardiovascular: Good opacification of the central and segmental pulmonary arteries. No focal filling defects. No evidence of significant pulmonary embolus. Normal caliber thoracic aorta without aortic dissection. Great vessel origins are patent. Normal heart size. No pericardial effusion. Mediastinum/Nodes: Mild prominence of mediastinal lymph nodes with pretracheal lymph node measuring about 1.1 cm diameter, similar to previous study. Likely reactive. Esophagus is decompressed. Lungs/Pleura: Severe diffuse emphysematous changes in the lungs with bullous changes in the apices. Scattered fibrosis. Patchy airspace disease in the lower lungs likely representing superimposed  pneumonia. Small left pleural effusion. No pneumothorax. Upper Abdomen: Calcifications in the pancreas likely representing chronic pancreatitis. Diffuse fatty infiltration of the liver. Celiac axis lymph nodes are mildly prominent, likely reactive and unchanged since prior study. Musculoskeletal: Degenerative changes in the spine. No destructive bone lesions. Review of the MIP images confirms the above findings. IMPRESSION: 1. No evidence of significant pulmonary embolus. 2. Severe diffuse emphysematous changes in the lungs with bullous changes in the apices. Patchy airspace disease in the lower lungs likely representing superimposed pneumonia. Small left pleural effusion. 3. Diffuse fatty infiltration of the liver. 4. Changes of chronic pancreatitis. Emphysema (ICD10-J43.9). Electronically Signed   By: Burman NievesWilliam  Stevens M.D.   On: 04/07/2018 23:10    EKG: Orders placed or performed during the hospital encounter of 04/07/18  . ED EKG within 10 minutes  . ED EKG within 10 minutes  . EKG 12-Lead  . EKG 12-Lead    IMPRESSION AND PLAN:  1.  Acute on chronic respiratory failure with hypoxia, secondary to acute COPD exacerbation and pneumonia.  We will start treatment with IV antibiotics, oxygen, nebulizer, cough medication.  Continue to monitor clinically closely. 2.  CAP, will start treatment with IV azithromycin and ceftriaxone. 3.  Acute COPD exacerbation, see treatment as above under #1. 4.  Tobacco abuse.  Smoking cessation was discussed with patient in detail.  All the records are reviewed and case discussed with ED provider. Management plans discussed with the patient, family and they are in agreement.  CODE STATUS: Full Code Status History    Date Active Date Inactive Code Status Order ID Comments User Context   01/21/2017 0222 01/24/2017 1608 Full Code [id number]  Arnaldo Nataliamond, Michael S, MD Inpatient       TOTAL TIME TAKING CARE OF THIS PATIENT: 50 minutes.  Cammy Copa M.D on 04/08/2018  at 12:53 AM  Between 7am to 6pm - Pager - [phone]  After 6pm go to [email or web address] - password [email or web address] [facility] [place] at [facility]  [phone]  CC: Primary care physician; Lauro Regulus, MD

## 2018-04-08 NOTE — ED Notes (Signed)
Pt provided sandwich tray upon request. 

## 2018-04-09 LAB — GLUCOSE, CAPILLARY: GLUCOSE-CAPILLARY: 91 mg/dL (ref 70–99)

## 2018-04-09 LAB — HIV ANTIBODY (ROUTINE TESTING W REFLEX): HIV SCREEN 4TH GENERATION: NONREACTIVE

## 2018-04-09 MED ORDER — AMOXICILLIN-POT CLAVULANATE 875-125 MG PO TABS: 1.0000 | ORAL_TABLET | Freq: Two times a day (BID) | ORAL | 0 refills | Status: AC

## 2018-04-09 NOTE — Discharge Summary (Signed)
[facility] - [place] at [facility]   PATIENT NAME: Austin Blevins    MR#:  [id number]  DATE OF BIRTH:  07/08/1960  DATE OF ADMISSION:  04/07/2018 ADMITTING PHYSICIAN: Cammy Copa, MD  DATE OF DISCHARGE: 04/09/2018  PRIMARY CARE PHYSICIAN: Lauro Regulus, MD    ADMISSION DIAGNOSIS:  COPD exacerbation (HCC) [J44.1] Chronic respiratory failure with hypoxia (HCC) [J96.11] Community acquired pneumonia, unspecified laterality [J18.9]  DISCHARGE DIAGNOSIS:  Pneumonia Chronic respiratory failure SECONDARY DIAGNOSIS:   Past Medical History:  Diagnosis Date  . Anxiety   . COPD (chronic obstructive pulmonary disease) (HCC)   . Depression   . Hydrocephalus [facility])     HOSPITAL COURSE:   Barry[place]neis a57 y.o.malewith a known history of tobacco abuse, COPD on chronic home oxygen and anxiety disorder. Patient presented to emergency room for worsening chronic shortness of breath and productive cough with yellow sputum,going on for the past 3 to 4 days, gradually getting worse. Patient also has occasional central chest pain with cough and deep breath. No reports of fever, chills, bleeding.  1.Acute on chronic respiratory failure with hypoxia,secondary to acute COPD exacerbation and pneumonia. -cont  IV antibiotics, oxygen, nebulizer,cough medication.Continue to monitor clinically closely. -not wheezing. Received high dose of IV steroid -change to oral augmentin (pt can afford) -sats 98% on 2 liters  2. CAP, will start treatment with IV azithromycin and ceftriaxone--change to po abxs No fever Feels better  3.Tobacco abuse.Smoking cessation was discussed with patient in detail--pt does not want to quit  D/w pt and sister D/c home. Pt agreeable  CONSULTS OBTAINED:    DRUG ALLERGIES:   Allergies  Allergen Reactions  . Percocet [Oxycodone-Acetaminophen]     DISCHARGE MEDICATIONS:   Allergies as of 04/09/2018    Reactions   Percocet [oxycodone-acetaminophen]       Medication List    STOP taking these medications   docusate sodium 100 MG capsule Commonly known as:  COLACE   traZODone 100 MG tablet Commonly known as:  DESYREL   varenicline 1 MG tablet Commonly known as:  CHANTIX     TAKE these medications   albuterol 108 (90 Base) MCG/ACT inhaler Commonly known as:  PROVENTIL HFA;VENTOLIN HFA Inhale 2 puffs into the lungs every 4 (four) hours as needed for wheezing or shortness of breath.   albuterol (2.5 MG/3ML) 0.083% nebulizer solution Commonly known as:  PROVENTIL Take 3 mLs (2.5 mg total) by nebulization every 6 (six) hours as needed for wheezing or shortness of breath.   amoxicillin-clavulanate 875-125 MG tablet Commonly known as:  AUGMENTIN Take 1 tablet by mouth 2 (two) times daily for 7 days.   ENSURE HIGH PROTEIN Liqd Take 1 Can by mouth 3 (three) times daily.   fluticasone furoate-vilanterol 200-25 MCG/INH Aepb Commonly known as:  BREO ELLIPTA Inhale 1 puff into the lungs daily.   mirtazapine 15 MG tablet Commonly known as:  REMERON Take 0.5 tablets (7.5 mg total) by mouth at bedtime.   MULTIVITAMIN ADULT Tabs Take 1 tablet by mouth daily.   rOPINIRole 0.5 MG tablet Commonly known as:  REQUIP TAKE ONE TABLET BY MOUTH 3 TIMES A DAY   tiotropium 18 MCG inhalation capsule Commonly known as:  SPIRIVA Place 1 capsule (18 mcg total) into inhaler and inhale daily.   Vitamin D (Ergocalciferol) 1.25 MG (50000 UT) Caps capsule Commonly known as:  DRISDOL Take 1 capsule (50,000 Units total) by mouth every 7 (seven) days.       If you  experience worsening of your admission symptoms, develop shortness of breath, life threatening emergency, suicidal or homicidal thoughts you must seek medical attention immediately by calling 911 or calling your MD immediately  if symptoms less severe.  You Must read complete instructions/literature along with all the possible adverse  reactions/side effects for all the Medicines you take and that have been prescribed to you. Take any new Medicines after you have completely understood and accept all the possible adverse reactions/side effects.   Please note  You were cared for by a hospitalist during your hospital stay. If you have any questions about your discharge medications or the care you received while you were in the hospital after you are discharged, you can call the unit and asked to speak with the hospitalist on call if the hospitalist that took care of you is not available. Once you are discharged, your primary care physician will handle any further medical issues. Please note that NO REFILLS for any discharge medications will be authorized once you are discharged, as it is imperative that you return to your primary care physician (or establish a relationship with a primary care physician if you do not have one) for your aftercare needs so that they can reassess your need for medications and monitor your lab values. Today   SUBJECTIVE   Doing well. No fever. Eating better  VITAL SIGNS:  Blood pressure 109/73, pulse 79, temperature (!) 97.5 F (36.4 C), temperature source Oral, resp. rate 18, height 5\' 8"  (1.727 m), weight 55.5 kg, SpO2 98 %.  I/O:    Intake/Output Summary (Last 24 hours) at 04/09/2018 0922 Last data filed at 04/09/2018 0300 Gross per 24 hour  Intake 679.79 ml  Output -  Net 679.79 ml    PHYSICAL EXAMINATION:  GENERAL:  57 y.o.-year-old patient lying in the bed with no acute distress. Thin cachectic EYES: Pupils equal, round, reactive to light and accommodation. No scleral icterus. Extraocular muscles intact.  HEENT: Head atraumatic, normocephalic. Oropharynx and nasopharynx clear.  NECK:  Supple, no jugular venous distention. No thyroid enlargement, no tenderness.  LUNGS: distant breath sounds bilaterally, no wheezing, rales,rhonchi or crepitation. No use of accessory muscles of  respiration. Emphysematous chest CARDIOVASCULAR: S1, S2 normal. No murmurs, rubs, or gallops.  ABDOMEN: Soft, non-tender, non-distended. Bowel sounds present. No organomegaly or mass.  EXTREMITIES: No pedal edema, cyanosis, or clubbing.  NEUROLOGIC: Cranial nerves II through XII are intact. Muscle strength 5/5 in all extremities. Sensation intact. Gait not checked.  PSYCHIATRIC: The patient is alert and oriented x 3.  SKIN: No obvious rash, lesion, or ulcer.   DATA REVIEW:   CBC  Recent Labs  Lab 04/08/18 0448  WBC 8.2  HGB 13.0  HCT 41.8  PLT 275    Chemistries  Recent Labs  Lab 04/08/18 0448  NA 140  K 4.4  CL 109  CO2 27  GLUCOSE 220*  BUN 19  CREATININE 1.24  CALCIUM 8.2*    Microbiology Results   No results found for this or any previous visit (from the past 240 hour(s)).  RADIOLOGY:  Dg Chest 2 View  Result Date: 04/07/2018 CLINICAL DATA:  Worsening shortness of breath and cough. EXAM: CHEST - 2 VIEW COMPARISON:  Multiple prior chest x-rays and chest CTs. FINDINGS: The cardiac silhouette, mediastinal and hilar contours are stable. There is a chronic right-sided central venous catheter again demonstrated with a small loop in it. Severe chronic lung disease with extensive pulmonary fibrosis, emphysema and chronic loculated bilateral  fluid collections. No definite acute overlying pulmonary process. IMPRESSION: Severe chronic lung disease without definite acute overlying pulmonary process. Electronically Signed   By: Rudie Meyer M.D.   On: 04/07/2018 17:39   Ct Angio Chest Pe W And/or Wo Contrast  Result Date: 04/07/2018 CLINICAL DATA:  Chronic shortness of breath from COPD. Worsening cough and shortness of breath over the last few days. Pain with cough. Home oxygen. EXAM: CT ANGIOGRAPHY CHEST WITH CONTRAST TECHNIQUE: Multidetector CT imaging of the chest was performed using the standard protocol during bolus administration of intravenous contrast. Multiplanar CT  image reconstructions and MIPs were obtained to evaluate the vascular anatomy. CONTRAST:  75mL OMNIPAQUE IOHEXOL 350 MG/ML SOLN COMPARISON:  09/15/2017 FINDINGS: Cardiovascular: Good opacification of the central and segmental pulmonary arteries. No focal filling defects. No evidence of significant pulmonary embolus. Normal caliber thoracic aorta without aortic dissection. Great vessel origins are patent. Normal heart size. No pericardial effusion. Mediastinum/Nodes: Mild prominence of mediastinal lymph nodes with pretracheal lymph node measuring about 1.1 cm diameter, similar to previous study. Likely reactive. Esophagus is decompressed. Lungs/Pleura: Severe diffuse emphysematous changes in the lungs with bullous changes in the apices. Scattered fibrosis. Patchy airspace disease in the lower lungs likely representing superimposed pneumonia. Small left pleural effusion. No pneumothorax. Upper Abdomen: Calcifications in the pancreas likely representing chronic pancreatitis. Diffuse fatty infiltration of the liver. Celiac axis lymph nodes are mildly prominent, likely reactive and unchanged since prior study. Musculoskeletal: Degenerative changes in the spine. No destructive bone lesions. Review of the MIP images confirms the above findings. IMPRESSION: 1. No evidence of significant pulmonary embolus. 2. Severe diffuse emphysematous changes in the lungs with bullous changes in the apices. Patchy airspace disease in the lower lungs likely representing superimposed pneumonia. Small left pleural effusion. 3. Diffuse fatty infiltration of the liver. 4. Changes of chronic pancreatitis. Emphysema (ICD10-J43.9). Electronically Signed   By: Burman Nieves M.D.   On: 04/07/2018 23:10     Management plans discussed with the patient, family and they are in agreement.  CODE STATUS:     Code Status Orders  (From admission, onward)         Start     Ordered   04/08/18 0139  Full code  Continuous     04/08/18 0139         Code Status History    Date Active Date Inactive Code Status Order ID Comments User Context   01/21/2017 0222 01/24/2017 1608 Full Code [id number]  Arnaldo Natal, MD Inpatient      TOTAL TIME TAKING CARE OF THIS PATIENT: *40* minutes.    Enedina Finner M.D on 04/09/2018 at 9:22 AM  Between 7am to 6pm - Pager - [phone] After 6pm go to [email or web address] - Social research officer, government  [facility]  Office  [phone]  CC: Primary care physician; Lauro Regulus, MD

## 2018-04-09 NOTE — Discharge Instructions (Signed)
Patient advised smoking cessation use your inhalers as before

## 2018-04-09 NOTE — Plan of Care (Signed)
  Problem: Activity: Goal: Risk for activity intolerance will decrease Outcome: Progressing   Problem: Elimination: Goal: Will not experience complications related to bowel motility Outcome: Progressing   

## 2018-04-09 NOTE — Progress Notes (Signed)
Patient is being discharged to home. DC & RX instructions given and patient acknowledged understanding. Inhalers used in the hospital given to patient to take home per Dr. Allena KatzPatel. IV removed. Belongings packed.

## 2018-04-15 ENCOUNTER — Telehealth: Payer: Self-pay

## 2018-04-15 NOTE — Telephone Encounter (Signed)
Flagged on EMMI report for not reading discharge papers, not having a follow up scheduled, and being unsure if given new prescriptions.  First attempt to reach patient made, however unable to reach patient. Unable to leave voicemail.  Will attempt at later time.

## 2018-04-18 NOTE — Telephone Encounter (Signed)
Reached upon second attempt.  Patient confirmed he did not receive any new prescriptions and has read his discharge papers.  Aware of his follow up at [facility] at 6pm on 04/19/18.  No other questions or issues at this time.

## 2018-04-19 ENCOUNTER — Ambulatory Visit: Payer: Self-pay

## 2018-04-19 ENCOUNTER — Telehealth: Payer: Self-pay | Admitting: Adult Health Nurse Practitioner

## 2018-04-19 NOTE — Telephone Encounter (Signed)
Patient left voicemail to reschedule appointment. Called back and rescheduled the appointment.

## 2018-04-20 ENCOUNTER — Telehealth: Payer: Self-pay | Admitting: Pharmacist

## 2018-04-20 NOTE — Telephone Encounter (Signed)
04/20/2018 11:11:33 AM - Virgel BouquetBreo Ellipta & Ventolin renewal-[employer]  04/20/18 I have faxed [employer] renewal application for Breo Ellipta 200/4525mcg Inhale 1 puff into the lungs every day-replaces Advair, also for Ventolin HFA 90mcg Inhale 2 puffs into the lungs every 4 hours as needed for wheezing or shortness of breath. Forde RadonAJ

## 2018-04-21 ENCOUNTER — Ambulatory Visit: Payer: Self-pay | Admitting: Adult Health Nurse Practitioner

## 2018-04-21 VITALS — BP 110/82 | HR 104 | Temp 97.5°F | Ht 69.0 in | Wt 122.6 lb

## 2018-04-21 DIAGNOSIS — J189 Pneumonia, unspecified organism: Secondary | ICD-10-CM

## 2018-04-21 NOTE — Progress Notes (Signed)
Patient: Austin Blevins Male    DOB: [birth year]   57 y.o.   MRN: [id number] Visit Date: 04/21/2018  Today's Provider: Jacelyn Pieah Doles-Johnson, NP   Chief Complaint  Patient presents with  . Follow-up    breathing complications, f/u for pneumonia   Subjective:    HPI   Recently hospitalized from 11.21-11.23.19 for COPD exacerbation and pneumonia.  Was transitioned from IV antibiotics to oral Augmentin with continuous oxygen and nebulizers.   Currently on 2L [place]- states that 4 liters give him a headache- he can sit without using the oxygen and O2 sat runs around 80-90%, 82-83% when walking with out oxygen, 75% when coughing. States that he is starting to rebound quicker.  Continus to have productive cough with cloudy sputum.  Finished Augmentin 3 days ago.  Using nebs with good results.   Eating and drinking at baseline.        Allergies  Allergen Reactions  . Percocet [Oxycodone-Acetaminophen]    Previous Medications   ALBUTEROL (PROVENTIL HFA;VENTOLIN HFA) 108 (90 BASE) MCG/ACT INHALER    Inhale 2 puffs into the lungs every 4 (four) hours as needed for wheezing or shortness of breath.   ALBUTEROL (PROVENTIL) (2.5 MG/3ML) 0.083% NEBULIZER SOLUTION    Take 3 mLs (2.5 mg total) by nebulization every 6 (six) hours as needed for wheezing or shortness of breath.   FLUTICASONE FUROATE-VILANTEROL (BREO ELLIPTA) 200-25 MCG/INH AEPB    Inhale 1 puff into the lungs daily.   MIRTAZAPINE (REMERON) 15 MG TABLET    Take 0.5 tablets (7.5 mg total) by mouth at bedtime.   MULTIPLE VITAMINS-MINERALS (MULTIVITAMIN ADULT) TABS    Take 1 tablet by mouth daily.   NUTRITIONAL SUPPLEMENTS (ENSURE HIGH PROTEIN) LIQD    Take 1 Can by mouth 3 (three) times daily.   ROPINIROLE (REQUIP) 0.5 MG TABLET    TAKE ONE TABLET BY MOUTH 3 TIMES A DAY   TIOTROPIUM (SPIRIVA) 18 MCG INHALATION CAPSULE    Place 1 capsule (18 mcg total) into inhaler and inhale daily.   VITAMIN D, ERGOCALCIFEROL, (DRISDOL) 50000 UNITS CAPS  CAPSULE    Take 1 capsule (50,000 Units total) by mouth every 7 (seven) days.    Review of Systems  Constitutional: Negative for chills and fever.  Respiratory: Positive for cough and shortness of breath.   Gastrointestinal: Negative for diarrhea, nausea and vomiting.    Social History   Tobacco Use  . Smoking status: Current Every Day Smoker    Packs/day: 1.00    Types: Cigarettes    Last attempt to quit: 06/17/2016    Years since quitting: 1.8  . Smokeless tobacco: Never Used  Substance Use Topics  . Alcohol use: No   Objective:   BP 110/82   Pulse (!) 104   Temp (!) 97.5 F (36.4 C)   Ht 5\' 9"  (1.753 m)   Wt 122 lb 9.6 oz (55.6 kg)   BMI 18.10 kg/m   Physical Exam  Constitutional: He is oriented to person, place, and time.  Cardiovascular: Normal rate and regular rhythm.  Pulmonary/Chest: Effort normal. He has decreased breath sounds.  Abdominal: Soft. Bowel sounds are normal.  Neurological: He is alert and oriented to person, place, and time.  Skin: Skin is warm and dry.  Vitals reviewed.      94% on RA Assessment & Plan:         Discuss smoking cessation.  Discussed dangers of smoking with oxygen.  Continue Spiriva daily and nebulizers.  Monitor for COPD exacerbations.  Ok to wean oxygen.  Encouraged to drink plenty of fluids.  Discussed need for appointment to see Pulmonology.    FU next week for labs.    Jacelyn Pi, NP   [facility]

## 2018-04-28 ENCOUNTER — Other Ambulatory Visit: Payer: Self-pay

## 2018-04-28 DIAGNOSIS — J189 Pneumonia, unspecified organism: Secondary | ICD-10-CM

## 2018-04-29 LAB — CBC
HEMATOCRIT: 43 % (ref 37.5–51.0)
Hemoglobin: 14.5 g/dL (ref 13.0–17.7)
MCH: 29.9 pg (ref 26.6–33.0)
MCHC: 33.7 g/dL (ref 31.5–35.7)
MCV: 89 fL (ref 79–97)
Platelets: 347 10*3/uL (ref 150–450)
RBC: 4.85 x10E6/uL (ref 4.14–5.80)
RDW: 12.2 % — ABNORMAL LOW (ref 12.3–15.4)
WBC: 6.9 10*3/uL (ref 3.4–10.8)

## 2018-04-29 LAB — BASIC METABOLIC PANEL
BUN / CREAT RATIO: 19 (ref 9–20)
BUN: 21 mg/dL (ref 6–24)
CO2: 27 mmol/L (ref 20–29)
CREATININE: 1.08 mg/dL (ref 0.76–1.27)
Calcium: 10.1 mg/dL (ref 8.7–10.2)
Chloride: 101 mmol/L (ref 96–106)
GFR calc Af Amer: 88 mL/min/{1.73_m2} (ref >59)
GFR calc non Af Amer: 76 mL/min/{1.73_m2} (ref >59)
GLUCOSE: 127 mg/dL — AB (ref 65–99)
Potassium: 5.1 mmol/L (ref 3.5–5.2)
Sodium: 141 mmol/L (ref 134–144)

## 2018-05-19 ENCOUNTER — Telehealth: Payer: Self-pay | Admitting: Pharmacist

## 2018-05-19 NOTE — Telephone Encounter (Signed)
05/19/2018 11:50:19 AM - Ventolin HFA refill  05/19/18 Placed refill online with [facility] for Ventolin HFA, to ship 07/22/18, order# [id number].Forde Radon

## 2018-06-23 ENCOUNTER — Ambulatory Visit: Payer: Self-pay

## 2018-06-30 ENCOUNTER — Ambulatory Visit: Payer: Medicaid Other | Admitting: Adult Health Nurse Practitioner

## 2018-06-30 VITALS — BP 129/79 | HR 100 | Temp 95.2°F | Wt 124.1 lb

## 2018-06-30 DIAGNOSIS — M25512 Pain in left shoulder: Secondary | ICD-10-CM

## 2018-06-30 NOTE — Progress Notes (Signed)
  Patient: Austin Blevins Male    DOB: 1960/10/05   58 y.o.   MRN: [id number] Visit Date: 06/30/2018  Today's Provider: Jacelyn Pi, NP   Chief Complaint  Patient presents with  . Follow-up    shoulder pain   Subjective:    HPI  Pt states that he cannot reach behind him with the left shoulder and he cannot even put his coat on. States that he has to have help to put his coat on. Pain is 2/10 at rest; 9/10 sharp stabbing pain with movement. Has taken advil for the pain with no results. Pain has been getting progressively worse over the past two weeks but started about 6 weeks ago.     Allergies  Allergen Reactions  . Percocet [Oxycodone-Acetaminophen]    Previous Medications   ALBUTEROL (PROVENTIL HFA;VENTOLIN HFA) 108 (90 BASE) MCG/ACT INHALER    Inhale 2 puffs into the lungs every 4 (four) hours as needed for wheezing or shortness of breath.   ALBUTEROL (PROVENTIL) (2.5 MG/3ML) 0.083% NEBULIZER SOLUTION    Take 3 mLs (2.5 mg total) by nebulization every 6 (six) hours as needed for wheezing or shortness of breath.   FLUTICASONE FUROATE-VILANTEROL (BREO ELLIPTA) 200-25 MCG/INH AEPB    Inhale 1 puff into the lungs daily.   MIRTAZAPINE (REMERON) 15 MG TABLET    Take 0.5 tablets (7.5 mg total) by mouth at bedtime.   MULTIPLE VITAMINS-MINERALS (MULTIVITAMIN ADULT) TABS    Take 1 tablet by mouth daily.   NUTRITIONAL SUPPLEMENTS (ENSURE HIGH PROTEIN) LIQD    Take 1 Can by mouth 3 (three) times daily.   ROPINIROLE (REQUIP) 0.5 MG TABLET    TAKE ONE TABLET BY MOUTH 3 TIMES A DAY   TIOTROPIUM (SPIRIVA) 18 MCG INHALATION CAPSULE    Place 1 capsule (18 mcg total) into inhaler and inhale daily.   VITAMIN D, ERGOCALCIFEROL, (DRISDOL) 50000 UNITS CAPS CAPSULE    Take 1 capsule (50,000 Units total) by mouth every 7 (seven) days.    Review of Systems  All other systems reviewed and are negative.   Social History   Tobacco Use  . Smoking status: Current Every Day Smoker    Packs/day: 1.00     Types: Cigarettes    Last attempt to quit: 06/17/2016    Years since quitting: 2.0  . Smokeless tobacco: Never Used  Substance Use Topics  . Alcohol use: No   Objective:   BP 129/79 (BP Location: Right Arm, Patient Position: Sitting, Cuff Size: Normal)   Pulse 100   Temp (!) 95.2 F (35.1 C)   Wt 124 lb 1.6 oz (56.3 kg)   BMI 18.33 kg/m   Physical Exam Vitals signs reviewed.  Musculoskeletal:     Left shoulder: He exhibits decreased range of motion. He exhibits no tenderness, no bony tenderness, no swelling, no effusion and no deformity.  Neurological:     Mental Status: He is alert.         Assessment & Plan:        Order shoulder x-ray.   Has medicaid- will get results and refer to Ortho if necessary.     Jacelyn Pi, NP   [facility]

## 2018-07-04 ENCOUNTER — Other Ambulatory Visit: Payer: Self-pay | Admitting: Internal Medicine

## 2018-07-05 ENCOUNTER — Other Ambulatory Visit: Payer: Self-pay

## 2018-07-05 ENCOUNTER — Ambulatory Visit
Admission: RE | Admit: 2018-07-05 | Discharge: 2018-07-05 | Disposition: A | Discharge status: Home / Self Care | Payer: Medicaid Other | Source: Ambulatory Visit | Attending: Adult Health Nurse Practitioner | Admitting: Adult Health Nurse Practitioner

## 2018-07-05 DIAGNOSIS — M25512 Pain in left shoulder: Secondary | ICD-10-CM | POA: Diagnosis present

## 2018-07-05 MED ORDER — ROPINIROLE HCL 0.5 MG PO TABS: ORAL_TABLET | ORAL | 0 refills | Status: DC

## 2018-07-05 MED ORDER — ALBUTEROL SULFATE HFA 108 (90 BASE) MCG/ACT IN AERS: 2.0000 | INHALATION_SPRAY | RESPIRATORY_TRACT | 0 refills | Status: DC | PRN

## 2018-07-05 MED ORDER — VITAMIN D (ERGOCALCIFEROL) 1.25 MG (50000 UNIT) PO CAPS: 50000.0000 [IU] | ORAL_CAPSULE | ORAL | 0 refills | Status: DC

## 2018-07-05 MED ORDER — TIOTROPIUM BROMIDE MONOHYDRATE 18 MCG IN CAPS: 18.0000 ug | ORAL_CAPSULE | Freq: Every day | RESPIRATORY_TRACT | 0 refills | Status: DC

## 2018-07-05 MED ORDER — FLUTICASONE FUROATE-VILANTEROL 200-25 MCG/INH IN AEPB: 1.0000 | INHALATION_SPRAY | Freq: Every day | RESPIRATORY_TRACT | 0 refills | Status: DC

## 2018-07-05 MED ORDER — ALBUTEROL SULFATE (2.5 MG/3ML) 0.083% IN NEBU: 2.5000 mg | INHALATION_SOLUTION | Freq: Four times a day (QID) | RESPIRATORY_TRACT | 0 refills | Status: DC | PRN

## 2018-07-05 NOTE — Progress Notes (Signed)
Patient requested all prescriptions be sent to [facility].

## 2018-07-11 ENCOUNTER — Other Ambulatory Visit: Payer: Self-pay | Admitting: Adult Health Nurse Practitioner

## 2018-07-12 ENCOUNTER — Other Ambulatory Visit: Payer: Self-pay | Admitting: Adult Health Nurse Practitioner

## 2018-07-17 ENCOUNTER — Encounter: Payer: Self-pay | Admitting: Nurse Practitioner

## 2018-07-17 DIAGNOSIS — R7303 Prediabetes: Secondary | ICD-10-CM | POA: Insufficient documentation

## 2018-07-19 ENCOUNTER — Ambulatory Visit (INDEPENDENT_AMBULATORY_CARE_PROVIDER_SITE_OTHER): Payer: Medicaid Other | Admitting: Nurse Practitioner

## 2018-07-19 ENCOUNTER — Encounter: Payer: Self-pay | Admitting: Nurse Practitioner

## 2018-07-19 VITALS — BP 116/82 | HR 116 | Temp 97.4°F | Ht 68.9 in | Wt 123.2 lb

## 2018-07-19 DIAGNOSIS — E43 Unspecified severe protein-calorie malnutrition: Secondary | ICD-10-CM | POA: Diagnosis not present

## 2018-07-19 DIAGNOSIS — F17219 Nicotine dependence, cigarettes, with unspecified nicotine-induced disorders: Secondary | ICD-10-CM

## 2018-07-19 DIAGNOSIS — M67912 Unspecified disorder of synovium and tendon, left shoulder: Secondary | ICD-10-CM | POA: Insufficient documentation

## 2018-07-19 DIAGNOSIS — J439 Emphysema, unspecified: Secondary | ICD-10-CM | POA: Diagnosis not present

## 2018-07-19 NOTE — Assessment & Plan Note (Signed)
Imaging recently performed.  Referral to physical therapy sent.  Recommend Biofreeze and Icy/Hot lidocaine patches at home.  If ongoing issues refer to ortho and obtain MRI.

## 2018-07-19 NOTE — Assessment & Plan Note (Signed)
With COPD.  I have recommended complete cessation of tobacco use. I have discussed various options available for assistance with tobacco cessation including over the counter methods (Nicotine gum, patch and lozenges). We also discussed prescription options (Chantix, Nicotine Inhaler / Nasal Spray). The patient is not interested in pursuing any prescription tobacco cessation options at this time.  

## 2018-07-19 NOTE — Patient Instructions (Signed)
Rotator Cuff Tendinitis  Rotator cuff tendinitis is inflammation of the tough, cord-like bands that connect muscle to bone (tendons) in the rotator cuff. The rotator cuff includes all of the muscles and tendons that connect the arm to the shoulder. The rotator cuff holds the head of the upper arm bone (humerus) in the cup (fossa) of the shoulder blade (scapula). This condition can lead to a long-lasting (chronic) tear. The tear may be partial or complete. What are the causes? This condition is usually caused by overusing the rotator cuff. What increases the risk? This condition is more likely to develop in athletes and workers who frequently use their shoulder or reach over their heads. This can include activities such as:  Tennis.  Baseball or softball.  Swimming.  Construction work.  Painting. What are the signs or symptoms? Symptoms of this condition include:  Pain spreading (radiating) from the shoulder to the upper arm.  Swelling and tenderness in front of the shoulder.  Pain when reaching, pulling, or lifting the arm above the head.  Pain when lowering the arm from above the head.  Minor pain in the shoulder when resting.  Increased pain in the shoulder at night.  Difficulty placing the arm behind the back. How is this diagnosed? This condition is diagnosed with a medical history and physical exam. Tests may also be done, including:  X-rays.  MRI.  Ultrasounds.  CT or MR arthrogram. During this test, a contrast material is injected and then images are taken. How is this treated? Treatment for this condition depends on the severity of the condition. In less severe cases, treatment may include:  Rest. This may be done with a sling that holds the shoulder still (immobilization). Your health care provider may also recommend avoiding activities that involve lifting your arm over your head.  Icing the shoulder.  Anti-inflammatory medicines, such as aspirin or  ibuprofen. In more severe cases, treatment may include:  Physical therapy.  Steroid injections.  Surgery. Follow these instructions at home: If you have a sling:  Wear the sling as told by your health care provider. Remove it only as told by your health care provider.  Loosen the sling if your fingers tingle, become numb, or turn cold and blue.  Keep the sling clean.  If the sling is not waterproof, do not let it get wet. Remove it, if allowed, or cover it with a watertight covering when you take a bath or shower. Managing pain, stiffness, and swelling  If directed, put ice on the injured area. ? If you have a removable sling, remove it as told by your health care provider. ? Put ice in a plastic bag. ? Place a towel between your skin and the bag. ? Leave the ice on for 20 minutes, 2-3 times a day.  Move your fingers often to avoid stiffness and to lessen swelling.  Raise (elevate) the injured area above the level of your heart while you are lying down.  Find a comfortable sleeping position or sleep on a recliner, if available. Driving  Do not drive or use heavy machinery while taking prescription pain medicine.  Ask your health care provider when it is safe to drive if you have a sling on your arm. Activity  Rest your shoulder as told by your health care provider.  Return to your normal activities as told by your health care provider. Ask your health care provider what activities are safe for you.  Do any exercises or stretches as   told by your health care provider.  If you do repetitive overhead tasks, take small breaks in between and include stretching exercises as told by your health care provider. General instructions  Do not use any products that contain nicotine or tobacco, such as cigarettes and e-cigarettes. These can delay healing. If you need help quitting, ask your health care provider.  Take over-the-counter and prescription medicines only as told by your  health care provider.  Keep all follow-up visits as told by your health care provider. This is important. Contact a health care provider if:  Your pain gets worse.  You have new pain in your arm, hands, or fingers.  Your pain is not relieved with medicine or does not get better after 6 weeks of treatment.  You have cracking sensations when moving your shoulder in certain directions.  You hear a snapping sound after using your shoulder, followed by severe pain and weakness. Get help right away if:  Your arm, hand, or fingers are numb or tingling.  Your arm, hand, or fingers are swollen or painful or they turn white or blue. Summary  Rotator cuff tendinitis is inflammation of the tough, cord-like bands that connect muscle to bone (tendons) in the rotator cuff.  This condition is usually caused by overusing the rotator cuff, which includes all of the muscles and tendons that connect the arm to the shoulder.  This condition is more likely to develop in athletes and workers who frequently use their shoulder or reach over their heads.  Treatment generally includes rest, anti-inflammatory medicines, and icing. In some cases, physical therapy and steroid injections may be needed. In severe cases, surgery may be needed. This information is not intended to replace advice given to you by your health care provider. Make sure you discuss any questions you have with your health care provider. Document Released: 07/25/2003 Document Revised: 04/20/2016 Document Reviewed: 04/20/2016 Elsevier Interactive Patient Education  2019 [employer] Inc[employer]  

## 2018-07-19 NOTE — Assessment & Plan Note (Signed)
Recommend protein shakes x 3 times a day.  Discussed stages of COPD.

## 2018-07-19 NOTE — Assessment & Plan Note (Signed)
Chronic, ongoing with O2 dependence.  No current pulmonary follow-up.  Will obtain spirometry next visit.  Continue current inhaler regimen.

## 2018-07-19 NOTE — Progress Notes (Addendum)
New Patient Office Visit  Subjective:  Patient ID: Austin Blevins, male    DOB: 02-Feb-1961  Age: 58 y.o. MRN: [id number]  CC:  Chief Complaint  Patient presents with  . Establish Care  . Shoulder Pain    Left, x 1 month, no known injury    HPI Austin Blevins presents for new patient visit to establish care.  Introduced to Publishing rights manager role and practice setting.  All questions answered.  Was previously being followed by [facility].  At baseline COPD diagnosed many years ago dependent on O2 for past year (has h/o PNA x 2, with last one last year), chronic pancreatitis (reported on CT scan 2018, patient does not know about this diagnosis and states no h/o alcohol use), smoker (currently smokes one pack per day), h/o depression/anxiety (no meds at this time), and protein-calorie malnutrition.    LEFT SHOULDER PAIN: Has had left shoulder pain for one month.  Did have imaging performed on 07/05/2018, which was negative for fracture, arthropathy, or soft tissue injury.  States pain is getting worse.  He can not think of anything that precipated the pain.  States when he reaches for his wallet in back pocket at times it is "like a real sharp pain", points to his upper/posterior left shoulder.  Also reports if he goes to "toss something, like into hamper" the side movement makes pain worse.  At worst he would report the pain as 8/10 and at best 2/10.  Pain is constant, but varies in tolerability.  Has not taken any medications at home for it, "I don't particularly like to take medication".  Sometimes at night the pain wakes him up, but in morning he wakes up with it and pain is 4/10.  Rest makes pain better.    COPD Diagnosed several years ago and has been O2 dependent the past year.  Has h/o PNA x 2, last one year ago.  Smoking one pack per day, not interested in quitting.  Currently takes Breo Ellipta and Spiriva as maintenance + Albuterol PRN. COPD status: stable Satisfied with current  treatment?: yes Oxygen use: yes Dyspnea frequency: at baseline, reports this at baseline, no worse Cough frequency: daily, no change from baseline Rescue inhaler frequency:  some days no use and some days 2 times a day (varies) Limitation of activity: no Productive cough: reports cough currently at baseline Last Spirometry: unsure Pneumovax: Up to Date Influenza: Not up to Date, refuses   Past Medical History:  Diagnosis Date  . Anxiety   . COPD (chronic obstructive pulmonary disease) (HCC)   . Depression   . Hydrocephalus [facility])     Past Surgical History:  Procedure Laterality Date  . BRAIN SURGERY  2003   [facility] shunt for hydrocephalus  . ventricular shunt      Family History  Problem Relation Age of Onset  . Diabetes Mellitus II Mother   . CAD Father   . Hypertension Father   . Arthritis/Rheumatoid Sister   . Hyperlipidemia Sister   . Hypertension Sister   . Depression Sister   . Anxiety disorder Sister   . Diabetes Sister   . Heart disease Brother   . Other Brother        DDD  . Diabetes Sister   . Diabetes Sister   . Arthritis/Rheumatoid Brother   . Other Brother        DDD    Social History   Socioeconomic History  . Marital status: Legally  Separated    Spouse name: Not on file  . Number of children: 0  . Years of education: eigth grade  . Highest education level: 7th grade  Occupational History  . Not on file  Social Needs  . Financial resource strain: Not very hard  . Food insecurity:    Worry: Never true    Inability: Never true  . Transportation needs:    Medical: No    Non-medical: No  Tobacco Use  . Smoking status: Current Every Day Smoker    Packs/day: 1.00    Types: Cigarettes    Last attempt to quit: 06/17/2016    Years since quitting: 2.0  . Smokeless tobacco: Never Used  Substance and Sexual Activity  . Alcohol use: No  . Drug use: No  . Sexual activity: Not on file  Lifestyle  . Physical activity:    Days per week: 0 days     Minutes per session: 0 min  . Stress: Very much  Relationships  . Social connections:    Talks on phone: Twice a week    Gets together: Never    Attends religious service: Never    Active member of club or organization: No    Attends meetings of clubs or organizations: Never    Relationship status: Separated  . Intimate partner violence:    Fear of current or ex partner: No    Emotionally abused: No    Physically abused: No    Forced sexual activity: No  Other Topics Concern  . Not on file  Social History Narrative   Lives with mom who pays for everything. He says he is secure for the first time in a while    ROS Review of Systems  Constitutional: Negative for activity change, diaphoresis, fatigue and fever.  Respiratory: Negative for cough, chest tightness, shortness of breath and wheezing.   Cardiovascular: Negative for chest pain, palpitations and leg swelling.  Gastrointestinal: Negative for abdominal distention, abdominal pain, constipation, diarrhea, nausea and vomiting.  Endocrine: Negative for cold intolerance, heat intolerance, polydipsia, polyphagia and polyuria.  Musculoskeletal: Positive for arthralgias.  Skin: Negative.   Neurological: Negative for dizziness, syncope, weakness, light-headedness, numbness and headaches.  Psychiatric/Behavioral: Negative.     Objective:   Today's Vitals: BP 116/82 (BP Location: Left Arm, Patient Position: Sitting, Cuff Size: Normal)   Pulse (!) 116   Temp (!) 97.4 F (36.3 C)   Ht 5' 8.9" (1.75 m) Comment: with shoes  Wt 123 lb 4 oz (55.9 kg) Comment: With shoes  SpO2 94%   BMI 18.25 kg/m   Physical Exam Vitals signs and nursing note reviewed.  Constitutional:      Appearance: He is well-developed.  HENT:     Head: Normocephalic and atraumatic.     Right Ear: Hearing normal. No drainage.     Left Ear: Hearing normal. No drainage.     Mouth/Throat:     Pharynx: Uvula midline.  Eyes:     General: Lids are normal.          Right eye: No discharge.        Left eye: No discharge.     Conjunctiva/sclera: Conjunctivae normal.     Pupils: Pupils are equal, round, and reactive to light.  Neck:     Musculoskeletal: Normal range of motion and neck supple.     Thyroid: No thyromegaly.     Vascular: No carotid bruit or JVD.     Trachea: Trachea normal.  Cardiovascular:  Rate and Rhythm: Normal rate and regular rhythm.     Heart sounds: Normal heart sounds, S1 normal and S2 normal. No murmur. No gallop.   Pulmonary:     Effort: Pulmonary effort is normal.     Breath sounds: Wheezing present.     Comments: Scattered expiratory wheezes throughout with diminished breath sounds. Abdominal:     General: Bowel sounds are normal.     Palpations: Abdomen is soft. There is no hepatomegaly or splenomegaly.  Musculoskeletal:     Right shoulder: He exhibits normal range of motion, no tenderness, no swelling, no crepitus, no pain and normal strength.     Left shoulder: He exhibits decreased range of motion, tenderness, pain and decreased strength. He exhibits no swelling and no crepitus.     Right lower leg: No edema.     Left lower leg: No edema.     Comments: Positive empty can to left shoulder with decreased strength noted.  Skin:    General: Skin is warm and dry.     Capillary Refill: Capillary refill takes less than 2 seconds.     Findings: No rash.  Neurological:     Mental Status: He is alert and oriented to person, place, and time.     Deep Tendon Reflexes: Reflexes are normal and symmetric.  Psychiatric:        Mood and Affect: Mood normal.        Behavior: Behavior normal.        Thought Content: Thought content normal.        Judgment: Judgment normal.     Assessment & Plan:   Problem List Items Addressed This Visit      Respiratory   COPD (chronic obstructive pulmonary disease) (HCC)    Chronic, ongoing with O2 dependence.  No current pulmonary follow-up.  Will obtain spirometry next visit.   Continue current inhaler regimen.        Relevant Medications   albuterol (PROVENTIL HFA;VENTOLIN HFA) 108 (90 Base) MCG/ACT inhaler     Nervous and Auditory   Nicotine dependence, cigarettes, w unsp disorders    With COPD.  I have recommended complete cessation of tobacco use. I have discussed various options available for assistance with tobacco cessation including over the counter methods (Nicotine gum, patch and lozenges). We also discussed prescription options (Chantix, Nicotine Inhaler / Nasal Spray). The patient is not interested in pursuing any prescription tobacco cessation options at this time.        Musculoskeletal and Integument   Rotator cuff dysfunction, left - Primary    Imaging recently performed.  Referral to physical therapy sent.  Recommend Biofreeze and Icy/Hot lidocaine patches at home.  If ongoing issues refer to ortho and obtain MRI.      Relevant Orders   Ambulatory referral to Physical Therapy     Other   Protein-calorie malnutrition, severe    Recommend protein shakes x 3 times a day.  Discussed stages of COPD.         Outpatient Encounter Medications as of 07/19/2018  Medication Sig  . albuterol (PROVENTIL HFA;VENTOLIN HFA) 108 (90 Base) MCG/ACT inhaler Inhale 2 puffs into the lungs every 4 (four) hours as needed for wheezing or shortness of breath.  [name] Kitchen albuterol (PROVENTIL) (2.5 MG/3ML) 0.083% nebulizer solution USE 1 VIAL VIA NEBULIZER EVERY 6 HOURS AS NEEDED FOR WHEEZING OR SHORTNESS OF BREATH  . fluticasone furoate-vilanterol (BREO ELLIPTA) 200-25 MCG/INH AEPB Inhale 1 puff into the lungs daily.  [name] Kitchen  rOPINIRole (REQUIP) 0.5 MG tablet Take 1 by my mouth 3 times a day  . tiotropium (SPIRIVA) 18 MCG inhalation capsule Place 1 capsule (18 mcg total) into inhaler and inhale daily.  . Vitamin D, Ergocalciferol, (DRISDOL) 1.25 MG (50000 UT) CAPS capsule Take 1 capsule (50,000 Units total) by mouth every 7 (seven) days.  . [DISCONTINUED] mirtazapine (REMERON) 15  MG tablet Take 0.5 tablets (7.5 mg total) by mouth at bedtime. (Patient not taking: Reported on 06/30/2018)  . [DISCONTINUED] Multiple Vitamins-Minerals (MULTIVITAMIN ADULT) TABS Take 1 tablet by mouth daily. (Patient not taking: Reported on 06/30/2018)  . [DISCONTINUED] Nutritional Supplements (ENSURE HIGH PROTEIN) LIQD Take 1 Can by mouth 3 (three) times daily. (Patient not taking: Reported on 06/30/2018)   No facility-administered encounter medications on file as of 07/19/2018.     Follow-up: Return in about 2 weeks (around 08/02/2018) for Follow-up (30 minutes) - COPD, chronic illness.   Marjie SkiffJOLENE T Laporche Martelle, NP

## 2018-07-28 ENCOUNTER — Other Ambulatory Visit: Payer: Self-pay

## 2018-07-28 ENCOUNTER — Ambulatory Visit: Payer: Medicaid Other | Attending: Nurse Practitioner

## 2018-07-28 DIAGNOSIS — M25512 Pain in left shoulder: Secondary | ICD-10-CM | POA: Diagnosis not present

## 2018-07-28 DIAGNOSIS — M6281 Muscle weakness (generalized): Secondary | ICD-10-CM | POA: Diagnosis present

## 2018-07-28 NOTE — Therapy (Signed)
[facility] PHYSICAL AND SPORTS MEDICINE [address] , [place], [address] Phone: [phone]   Fax:  [phone]  Physical Therapy Evaluation  Patient Details  Name: Austin Blevins MRN: [id number] Date of Birth: 03-25-1961 Referring Provider (PT): Aura DialsJolene [name], NP   Encounter Date: 07/28/2018  PT End of Session - 07/28/18 1549    Visit Number  1    Number of Visits  4    Date for PT Re-Evaluation  09/01/18    PT Start Time  1549    PT Stop Time  1650    PT Time Calculation (min)  61 min    Activity Tolerance  Patient tolerated treatment well    Behavior During Therapy  [facility] for tasks assessed/performed       Past Medical History:  Diagnosis Date  . Anxiety   . COPD (chronic obstructive pulmonary disease) (HCC)   . Depression   . Hydrocephalus [facility](HCC)     Past Surgical History:  Procedure Laterality Date  . BRAIN SURGERY  2003   VA shunt for hydrocephalus  . ventricular shunt      There were no vitals filed for this visit.   Subjective Assessment - 07/28/18 1554    Subjective  L shoulder: 2/10 currently, 10/10 at worst     Pertinent History  L shoulder pain. Pain is located in his joint. Pain began suddenly last month. R shoulder is starting to bother him but not as bad as his L shoulder. Pt is R hand dominant.  Had an x-ray for his L shoulder which did not reveal any fractures.  No chronic conditions. No L UE tingling or numbness.  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     Pt uses 2 L O2 with activity; Used O2 for about a year   Patient Stated Goals  Be better able to reach behind his back.     Currently in Pain?  Yes    Pain Score  2     Pain Location  Shoulder    Pain Orientation  Left    Pain Descriptors / Indicators  Sharp;Stabbing;Sore    Pain Type  Acute pain    Pain Onset  1 to 4 weeks ago    Pain Frequency  Occasional    Aggravating Factors   reaching behind his L back  pocket which is worse when he is sitting, lifting his L arm up to the side (abduction and ER), raising his L arm up.     Pain Relieving Factors  rest         [facility] - 07/28/18 1605      Assessment   Medical Diagnosis  Rotator cuff dysfunction, L     Referring Provider (PT)  Aura DialsJolene [name], NP    Onset Date/Surgical Date  07/19/18    Hand Dominance  Right    Prior Therapy  no known PT for current condition      Precautions   Precaution Comments  VA shunt R chest, neck, head for hydrocephalus; uses O2 tank      Restrictions   Other Position/Activity Restrictions  no known restrictions      Balance Screen   Has the patient fallen in the past 6 months  No    Has the patient had a decrease in activity level because of a fear of falling?   No    Is the patient reluctant to leave  their home because of a fear of falling?   No      Home Environment   Additional Comments  Pt lives in a one story home with mother.  3 steps to enter with B rails.  Does not go up back door, 10 steps with B rail       Prior Function   Vocation  On disability    Vocation Requirements  PLOF: No difficulty using L arm.       Observation/Other Assessments   Observations  Pt using O2 tank. 2L with activity. (+) empty can, Benancio Deeds. Yocum test causes L teres major discomfort.       Posture/Postural Control   Posture Comments  B protracted shoulders and neck, L shoulder lower, B scapular winging. R scapula more lateral compared to L , Kyphosis, L upper thoracic rotation, R lateral shift, L anterior pelvic rotation, R hip ER      AROM   Right Shoulder Flexion  119 Degrees    Right Shoulder ABduction  73 Degrees    Left Shoulder Flexion  93 Degrees   100 AAROM with pain   Left Shoulder ABduction  70 Degrees   74 AAROM with pain   Left Shoulder Internal Rotation  35 Degrees   45 degrees AAROM   Left Shoulder External Rotation  26 Degrees   30 AAROM with pain   Cervical Flexion  WFL     Cervical Extension  limited with B lateral neck discomfort    Cervical - Right Side Bend  limited    Cervical - Left Side Bend  limited    Cervical - Right Rotation  limited    Cervical - Left Rotation  limited      Strength   Right Shoulder Flexion  4+/5    Right Shoulder ABduction  4/5    Right Shoulder Internal Rotation  4+/5    Right Shoulder External Rotation  4/5    Left Shoulder Flexion  4-/5   with pain   Left Shoulder ABduction  4/5    Left Shoulder Internal Rotation  4/5    Left Shoulder External Rotation  4/5      Palpation   Palpation comment  muscle atrophy B Upper trap muscles, TTP L anterior shoulder                 Objective measurements completed on examination: See above findings.     Pt states no blood pressure problems   96% SPO2, room air, HR 99 BPM  Has a VA shunt that runs from his R chest, R lateral neck, to his head due to hydrocephalus.    TTP L anterior shoulder  Therapeutic exercise   Seated B scapular retraction 10x5 seconds    With pt sitting on L hip to decrease R lateral shift and decrease back pain during exercise.   Reviewed and given as part of his HEP. Pt demonstrated and verbalized understanding.   Self inferior L shoulder joint mob, L hand on chair 10x5 seconds  Improved ability to reach to L back pocket  Improved exercise technique, movement at target joints, use of target muscles after mod verbal, visual, tactile cues.     Manual therapy  Seated gentle inferior glide, static hold 10x5 seconds   Patient is a 58 year old male who came to physical therapy secondary to L shoulder pain. He also presents with poor posture, B scapular and shoulder weakness, muscle atrophy, positive special tests suggesting rotator  cuff involvement as well as shoulder impingement, and difficulty performing functional tasks such as reaching behind his back pocket, reaching forward and to the side, and raising his arm. Pt will benefit from  skilled physical therapy services to address the aforementioned deficits.    PT Education - 07/28/18 1925    Education Details  ther-ex, HEP, plan of care    Person(s) Educated  Patient    Methods  Explanation;Demonstration;Tactile cues;Verbal cues    Comprehension  Returned demonstration;Verbalized understanding        PT Short Term Goals - 07/28/18 1906      PT SHORT TERM GOAL #1   Title  Patient will be independent with his HEP to decrease pain, improve ROM and function.     Baseline  Pt has started a limited HEP (07/28/2018)    Time  3    Period  Weeks    Status  New    Target Date  08/25/18        PT Long Term Goals - 07/28/18 1908      PT LONG TERM GOAL #1   Title  Patient will have a decrease in L shoulder pain to 4/10 or less at worst to promote ability to reach, raise his arm, and use his L UE to perform functional tasks.     Baseline  10/10 L shoulder pain at worst (07/28/2018)    Time  4    Period  Weeks    Status  New    Target Date  09/01/18      PT LONG TERM GOAL #2   Title  Patient will improve L shoulder flexion and abduction AROM by at least 30 degrees to promote ability to reach, raise his arm, use his L UE for functional tasks.     Baseline  L shoulder AROM flexion 93 degrees, abduction 70 degrees (07/28/2018)    Time  4    Period  Weeks    Status  New    Target Date  09/01/18      PT LONG TERM GOAL #3   Title  Patient will improve L shoulder strength by at least 1/2 MMT grade to promote ability to use his L UE to perform functional tasks with less pain.     Baseline  L shoulder flexion 4-/5, abduction 4/5, ER 4/5, IR 4/5 (07/28/2018)    Time  4    Period  Weeks    Status  New    Target Date  09/01/18      PT LONG TERM GOAL #4   Title  Patient will improve L shoulder IR AROM to at least 70 degrees to promote ability to reach behind his back more comfortably.     Baseline  L shoulder IR AROM 35 degrees (07/28/2018)    Time  4    Period  Weeks     Status  New    Target Date  09/01/18             Plan - 07/28/18 1849    Clinical Impression Statement  Patient is a 58 year old male who came to physical therapy secondary to L shoulder pain. He also presents with poor posture, B scapular and shoulder weakness, muscle atrophy, positive special tests suggesting rotator cuff involvement as well as shoulder impingement, and difficulty performing functional tasks such as reaching behind his back pocket, reaching forward and to the side, and raising his arm. Pt will benefit from skilled physical therapy services  to address the aforementioned deficits.    Personal Factors and Comorbidities  Age;Comorbidity 3+;Fitness    Comorbidities  current smoker, protein malnutrition, COPD    Examination-Activity Limitations  Bathing;Carry;Dressing;Reach Overhead;Toileting    Stability/Clinical Decision Making  Evolving/Moderate complexity   Pain seems to be worse overall since onset based on subjective report   Clinical Decision Making  Moderate    Rehab Potential  Fair    PT Frequency  1x / week    PT Duration  4 weeks   based on Medicaid    PT Treatment/Interventions  Electrical Stimulation;Iontophoresis 4mg /ml Dexamethasone;Therapeutic activities;Therapeutic exercise;Neuromuscular re-education;Patient/family education;Manual techniques;Dry needling    PT Next Visit Plan  scapular, shoulder strengthening, ROM, thoracic extension, glenohumeral control, manual techniques, modalities PRN    Consulted and Agree with Plan of Care  Patient       Patient will benefit from skilled therapeutic intervention in order to improve the following deficits and impairments:  Pain, Improper body mechanics, Impaired UE functional use, Decreased strength, Decreased range of motion  Visit Diagnosis: Acute pain of left shoulder - Plan: PT plan of care cert/re-cert  Muscle weakness (generalized) - Plan: PT plan of care cert/re-cert     Problem List Patient Active  Problem List   Diagnosis Date Noted  . Rotator cuff dysfunction, left 07/19/2018  . Prediabetes 07/17/2018  . Anxiety 06/22/2017  . Nicotine dependence, cigarettes, w unsp disorders 05/25/2017  . Chronic pancreatitis (HCC) 05/25/2017  . COPD (chronic obstructive pulmonary disease) (HCC) 02/11/2017  . Protein-calorie malnutrition, severe 01/22/2017  . Depression, major, recurrent, moderate (HCC) 04/11/2014    Loralyn Freshwater PT, DPT   07/28/2018, 7:29 PM  [place] [facility] PHYSICAL AND SPORTS MEDICINE [address] [place], [address] Phone: [phone]   Fax:  [phone]  Name: Austin Blevins MRN: [id number] Date of Birth: 1960-06-26

## 2018-07-28 NOTE — Patient Instructions (Signed)
Seated B scapular retraction 10x5 seconds for 3 sets at least. Try to perform throughout the day.   (With pt sitting on L hip to decrease R lateral shift and decrease back pain during exercise)   Reviewed and given as part of his HEP. Pt demonstrated and verbalized understanding.

## 2018-08-02 ENCOUNTER — Other Ambulatory Visit: Payer: Self-pay

## 2018-08-02 ENCOUNTER — Encounter: Payer: Self-pay | Admitting: Nurse Practitioner

## 2018-08-02 ENCOUNTER — Ambulatory Visit: Payer: Medicaid Other | Admitting: Nurse Practitioner

## 2018-08-02 VITALS — BP 122/80 | HR 118 | Temp 97.6°F | Ht 68.9 in

## 2018-08-02 DIAGNOSIS — J439 Emphysema, unspecified: Secondary | ICD-10-CM | POA: Diagnosis not present

## 2018-08-02 DIAGNOSIS — G2581 Restless legs syndrome: Secondary | ICD-10-CM | POA: Diagnosis not present

## 2018-08-02 DIAGNOSIS — K219 Gastro-esophageal reflux disease without esophagitis: Secondary | ICD-10-CM | POA: Diagnosis not present

## 2018-08-02 MED ORDER — TIOTROPIUM BROMIDE MONOHYDRATE 18 MCG IN CAPS: 18.0000 ug | ORAL_CAPSULE | Freq: Every day | RESPIRATORY_TRACT | 2 refills | Status: DC

## 2018-08-02 MED ORDER — FLUTICASONE FUROATE-VILANTEROL 200-25 MCG/INH IN AEPB: 1.0000 | INHALATION_SPRAY | Freq: Every day | RESPIRATORY_TRACT | 2 refills | Status: DC

## 2018-08-02 MED ORDER — OMEPRAZOLE 20 MG PO CPDR: 20.0000 mg | DELAYED_RELEASE_CAPSULE | Freq: Every day | ORAL | 3 refills | Status: DC

## 2018-08-02 MED ORDER — ALBUTEROL SULFATE HFA 108 (90 BASE) MCG/ACT IN AERS: 2.0000 | INHALATION_SPRAY | RESPIRATORY_TRACT | 6 refills | Status: DC | PRN

## 2018-08-02 MED ORDER — ALBUTEROL SULFATE (2.5 MG/3ML) 0.083% IN NEBU: INHALATION_SOLUTION | RESPIRATORY_TRACT | 4 refills | Status: DC

## 2018-08-02 MED ORDER — ROPINIROLE HCL 0.5 MG PO TABS: ORAL_TABLET | ORAL | 2 refills | Status: DC

## 2018-08-02 NOTE — Assessment & Plan Note (Addendum)
Chronic, ongoing. 2L O2 dependence. Continue with current inhaler and nebulizer regimen. Refills provided today. Referral to chronic care management team today. Spirometry needed on next visit. Follow-up in 3 months.

## 2018-08-02 NOTE — Assessment & Plan Note (Addendum)
Ongoing, symptoms improved with Requip. Refill sent today. Continue medication as directed.

## 2018-08-02 NOTE — Patient Instructions (Signed)

## 2018-08-02 NOTE — Progress Notes (Signed)
BP 122/80 (BP Location: Left Arm, Patient Position: Sitting, Cuff Size: Normal)   Pulse (!) 118   Temp 97.6 F (36.4 C)   Ht 5' 8.9" (1.75 m)   SpO2 98%   BMI 18.25 kg/m    Subjective:    Patient ID: Austin Blevins, male    DOB: 1960-06-04, 58 y.o.   MRN: [id number]  HPI: Austin Blevins is a 58 y.o. male  Chief Complaint  Patient presents with  . COPD    Refills   . RLS    Refill  Discussed the Chronic Care Management team with the patient. He expressed interest in their services. Referral placed.   COPD On Breo, Spiriva, and Albuterol for COPD management- taking medication daily. Pt reports cough is worse in the morning when "getting started" but eases as the day progresses. States he tolerates activity fairly well but when he stops "I get a coughing spell for about 10 minutes".  COPD status: stable Satisfied with current treatment?: yes Oxygen use: 2 L  Dyspnea frequency: "every time I move" Cough frequency: "every morning for a few hours and when I do anything active" Rescue inhaler frequency: "about 3 times a day on average"   Limitation of activity: yes Productive cough: yes- "cream color with a lot of foam" Last Spirometry:  Pneumovax: Up to Date Influenza: Not up to Date - refused  RESTLESS LEGS On Requip for RLS. Pt reports the symptoms are significantly improved with the medication, "it has been a lifesaver for me, before it I couldn't do anything but walk around and kick and scream".   Duration: chronic Discomfort description:  "like something running around in my leg trying to get out" Pain: yes Location: enitre leg Bilateral: yes Symmetric: no- "left leg seems to be a lot worse" Severity: 8/10 without Requip 2/10 with Requip Onset:  gradual Frequency:  constant- every night, 2-3 times per month during the day Symptoms only occur while legs at rest: yes Sudden unintentional leg jerking: yes Bed partner bothered by leg movements:  LE numbness: no  Decreased sensation: yes Weakness: yes Insomnia: "not when taking the Requip" Daytime somnolence: no Fatigue: yes Alleviating factors:  Aggravating factors:  Status: stable  GERD Pt reports "coughing up" neon yellow colored sputum appx 4 days ago. Pt reports this is the first time this has happened. He reports severe burning in his esophagus following the episode lasting appx 2 days. He states he believes it could have been Austin Blevins he had been drinking. He reports the first day he felt the burning constantly with inability to sleep. He reports taking Tums and drinking milk and this improved his symptoms, but the burning sensation returned with drinking coffee. He reports a "vague burning on and off" at this time. He denies reflux symptoms in the past. He reports only using antacids "once or twice a year" and no chronic medications for GERD. He denies dysphasia or hematemesis. Discussed a 6 week trial of Prilosec.    Relevant past medical, surgical, family and social history reviewed and updated as indicated. Interim medical history since our last visit reviewed. Allergies and medications reviewed and updated.  Review of Systems  Per HPI unless specifically indicated above     Objective:    BP 122/80 (BP Location: Left Arm, Patient Position: Sitting, Cuff Size: Normal)   Pulse (!) 118   Temp 97.6 F (36.4 C)   Ht 5' 8.9" (1.75 m)   SpO2 98%   BMI  18.25 kg/m   Wt Readings from Last 3 Encounters:  07/19/18 123 lb 4 oz (55.9 kg)  06/30/18 124 lb 1.6 oz (56.3 kg)  04/21/18 122 lb 9.6 oz (55.6 kg)    Physical Exam Vitals signs and nursing note reviewed.  Constitutional:      Appearance: He is well-developed.  HENT:     Head: Normocephalic and atraumatic.     Right Ear: Hearing normal. No drainage.     Left Ear: Hearing normal. No drainage.     Mouth/Throat:     Pharynx: Uvula midline.  Eyes:     General: Lids are normal.        Right eye: No discharge.        Left  eye: No discharge.     Conjunctiva/sclera: Conjunctivae normal.     Pupils: Pupils are equal, round, and reactive to light.  Neck:     Musculoskeletal: Normal range of motion and neck supple.     Thyroid: No thyromegaly.     Vascular: No carotid bruit or JVD.     Trachea: Trachea normal.  Cardiovascular:     Rate and Rhythm: Normal rate and regular rhythm.     Heart sounds: Normal heart sounds, S1 normal and S2 normal. No murmur. No gallop.   Pulmonary:     Effort: Pulmonary effort is normal.     Breath sounds: Examination of the right-upper field reveals wheezing. Examination of the right-middle field reveals decreased breath sounds and wheezing. Examination of the left-middle field reveals decreased breath sounds. Examination of the right-lower field reveals decreased breath sounds and wheezing. Examination of the left-lower field reveals decreased breath sounds. Decreased breath sounds and wheezing present.  Abdominal:     General: Abdomen is flat. Bowel sounds are normal.     Palpations: Abdomen is soft. There is no hepatomegaly or splenomegaly.     Tenderness: There is abdominal tenderness in the epigastric area.  Musculoskeletal: Normal range of motion.     Right lower leg: No edema.     Left lower leg: No edema.  Skin:    General: Skin is warm and dry.     Capillary Refill: Capillary refill takes less than 2 seconds.     Findings: No rash.  Neurological:     Mental Status: He is alert and oriented to person, place, and time.     Deep Tendon Reflexes: Reflexes are normal and symmetric.  Psychiatric:        Mood and Affect: Mood normal.        Behavior: Behavior normal.        Thought Content: Thought content normal.        Judgment: Judgment normal.     Results for orders placed or performed in visit on 07/19/18  HM HEPATITIS C SCREENING LAB  Result Value Ref Range   HM Hepatitis Screen Negative - Patient Reported       Assessment & Plan:   Problem List Items  Addressed This Visit      Respiratory   COPD (chronic obstructive pulmonary disease) (HCC) - Primary    Chronic, ongoing. 2L O2 dependence. Continue with current inhaler and nebulizer regimen. Refills provided today. Referral to chronic care management team today. Spirometry needed on next visit. Follow-up in 3 months.       Relevant Orders   Ambulatory referral to Chronic Care Management Services     Digestive   Gastroesophageal reflux disease    New onset. Script sent  for Prilosec 20mg  trial. Follow-up in 6 weeks.         Other   Restless legs syndrome (RLS)    Ongoing, symptoms improved with Requip. Refill sent today. Continue medication as directed.          Follow up plan: Return in about 6 weeks (around 09/13/2018) for GERD.   NOTE WRITTEN BY [employer] DNP STUDENT.  ASSESSMENT AND PLAN OF CARE REVIEWED WITH STUDENT, AGREE WITH ABOVE FINDINGS AND PLAN.

## 2018-08-02 NOTE — Assessment & Plan Note (Signed)
New onset. Script sent for Prilosec 20mg  trial. Follow-up in 6 weeks.

## 2018-08-03 ENCOUNTER — Telehealth: Payer: Self-pay

## 2018-08-03 ENCOUNTER — Other Ambulatory Visit: Payer: Self-pay | Admitting: Adult Health Nurse Practitioner

## 2018-08-03 NOTE — Telephone Encounter (Signed)
PA submitted for Breo via [employer]. Pending review. Confirmation #: [id number]

## 2018-08-04 ENCOUNTER — Ambulatory Visit: Payer: Medicaid Other

## 2018-08-05 ENCOUNTER — Telehealth: Payer: Self-pay

## 2018-08-05 ENCOUNTER — Ambulatory Visit: Payer: Self-pay | Admitting: Pharmacist

## 2018-08-05 DIAGNOSIS — J439 Emphysema, unspecified: Secondary | ICD-10-CM

## 2018-08-05 NOTE — Chronic Care Management (AMB) (Signed)
  Chronic Care Management   Note  08/05/2018 Name: Austin Blevins MRN: [id number] DOB: Oct 12, 1960  Patient is a 58 year old male followed by Aura Dials, NP for primary care services, referred to chronic care management for support with managing COPD, nicotine dependence, depression, RLS.   Contacted patient, left HIPAA compliant message to return my call at his convenience.   Follow up plan: - If I have not heard back, will outreach patient again next week.   Catie Feliz Beam, PharmD Clinical Pharmacist [facility] [phone]

## 2018-08-08 NOTE — Telephone Encounter (Signed)
Medicaid did not approve the request for breo ellipta

## 2018-08-08 NOTE — Therapy (Signed)
[place] [facility] PH[facility]ORTS MEDICINE [address], [place], [address] Phone: [phone]   Fax:  [phone]  Patient Details  Name: Austin Blevins MRN: [id number] Date of Birth: 10/25/1960 Referring Provider:  No ref. provider found  Encounter Date: 08/08/2018      Called patient and informed patient about current clinic closure for a minimum of 2 weeks due to the corona virus outbreak. Informed patient that when the clinic reopens again that someone should be able to call him to schedule more follow-up appointments. Has been doing his home exercise. Interested in [employer] [facility] or online physical therapy if available.     Loralyn Freshwater PT, DPT   08/08/2018, 1:10 PM  [facility] PHYSICAL AND SPORTS MEDICINE [address] [place], [address] Phone: [phone]   Fax:  [phone]

## 2018-08-09 ENCOUNTER — Other Ambulatory Visit: Payer: Self-pay

## 2018-08-09 ENCOUNTER — Ambulatory Visit: Payer: Medicaid Other

## 2018-08-09 ENCOUNTER — Ambulatory Visit: Payer: Self-pay | Admitting: Licensed Clinical Social Worker

## 2018-08-09 DIAGNOSIS — F331 Major depressive disorder, recurrent, moderate: Secondary | ICD-10-CM

## 2018-08-09 DIAGNOSIS — F419 Anxiety disorder, unspecified: Secondary | ICD-10-CM

## 2018-08-09 DIAGNOSIS — J439 Emphysema, unspecified: Secondary | ICD-10-CM

## 2018-08-09 DIAGNOSIS — E43 Unspecified severe protein-calorie malnutrition: Secondary | ICD-10-CM

## 2018-08-09 NOTE — Patient Instructions (Signed)
Licensed Clinical Social Worker Visit Information  Goals we discussed today:  Goals Addressed    . "I need financial assistance" (pt-stated)       Current Barriers:  . Financial constraints . Limited social support . Limited access to food . Lacks knowledge of community resource: available financial and nutritional suport resources  Clinical Social Work Clinical Goal(s):  [name] Kitchen Over the next 90 days, client will work with SW to address concerns related to lack of finances in order to afford nutritional supplies  . Over the next 90 days, patient will work with LCSW to address needs related to financial contstraints  . Over the next 90 days, patient will attend all scheduled medical appointments  Interventions: . Patient interviewed and appropriate assessments performed . Discussed plans with patient for ongoing care management follow up and provided patient with direct contact information for care management team . Advised patient to check mailbox for [employer] coupons that will be mailed to patient's residence . Collaborated with RN Case Manager re: patient's low appetite . Assisted patient/caregiver with obtaining information about health plan benefits  Patient Self Care Activities:  . Attends all scheduled provider appointments . Calls provider office for new concerns or questions  Initial goal documentation     . "I need to work on my depression" (pt-stated)       Current Barriers:  . Financial constraints . Mental Health Concerns  . Social Isolation . Limited education about appropriate self-care tools to implement into his daily routine to decrease depressive symptoms*  Clinical Social Work Clinical Goal(s):  [name] Kitchen Over the next 90 days, client will work with SW to address concerns related to depression . Over the next 90 days, patient will attend all scheduled medical appointments: as needed . Over the next 90 days, patient will demonstrate improved health management independence  as evidenced by implementing appropriate self-care methods into daily routine  Interventions: . Patient interviewed and appropriate assessments performed . Provided mental health counseling with regard to depression (mental health diagnosis or concern) . Provided patient with information about depression management . Collaborated with RN Case Manager re: patient's ongoing depressive symptoms related to low appetite . Assisted patient/caregiver with obtaining information about health plan benefits  Patient Self Care Activities:  . Attends all scheduled provider appointments . Calls provider office for new concerns or questions  Initial goal documentation         Materials provided: Verbal education about Medicaid resources provided by phone  Mr. Ashraf was given information about Chronic Care Management services today including:  1. CCM service includes personalized support from designated clinical staff supervised by his physician, including individualized plan of care and coordination with other care providers 2. 24/7 contact phone numbers for assistance for urgent and routine care needs. 3. Service will only be billed when office clinical staff spend 20 minutes or more in a month to coordinate care. 4. Only one practitioner may furnish and bill the service in a calendar month. 5. The patient may stop CCM services at any time (effective at the end of the month) by phone call to the office staff. 6. The patient will be responsible for cost sharing (co-pay) of up to 20% of the service fee (after annual deductible is met).  Patient agreed to services and verbal consent obtained.   Print copy of patient instructions provided.   Follow up plan: SW will follow up with patient by phone over the next 2-3 weeks  Eula Fried, BSW, MSW, [employer]  [email or web address] Phone: [phone]

## 2018-08-09 NOTE — Chronic Care Management (AMB) (Addendum)
Chronic Care Management    Clinical Social Work General Note  08/09/2018 Name: Austin Blevins MRN: [id number] DOB: [birth year]  Austin Blevins is a 58 y.o. year old male who is a primary care patient of [name], [name], NP. The CCM was consulted to assist the patient with Food Insecurity and [employer]. LCSW made initial outreach on 08/09/2018 and was able to reach patient successfully. HIPPA verifications received. Patient is agreeable to CCM involvement.   Austin Blevins was given information about Chronic Care Management services today including:  1. CCM service includes personalized support from designated clinical staff supervised by his physician, including individualized plan of care and coordination with other care providers 2. 24/7 contact phone numbers for assistance for urgent and routine care needs. 3. Service will only be billed when office clinical staff spend 20 minutes or more in a month to coordinate care. 4. Only one practitioner may furnish and bill the service in a calendar month. 5. The patient may stop CCM services at any time (effective at the end of the month) by phone call to the office staff. 6. The patient will be responsible for cost sharing (co-pay) of up to 20% of the service fee (after annual deductible is met).  Patient agreed to services and verbal consent obtained.   Review of patient status, including review of consultants reports, relevant laboratory and other test results, and collaboration with appropriate care team members and the patient's provider was performed as part of comprehensive patient evaluation and provision of chronic care management services.    Goals Addressed    . "I need financial assistance" (pt-stated)       Current Barriers:  . Financial constraints . Limited social support . Limited access to food . Lacks knowledge of community resource: available financial and nutritional suport resources  Clinical Social Work Clinical  Goal(s):  [name] Kitchen Over the next 90 days, client will work with SW to address concerns related to lack of finances in order to afford nutritional supplies  . Over the next 90 days, patient will work with LCSW to address needs related to financial contstraints  . Over the next 90 days, patient will attend all scheduled medical appointments  Interventions: . Patient interviewed and appropriate assessments performed . Discussed plans with patient for ongoing care management follow up and provided patient with direct contact information for care management team . Advised patient to check mailbox for Ensure coupons that will be mailed to patient's residence . Collaborated with RN Case Manager re: patient's low appetite . Assisted patient/caregiver with obtaining information about health plan benefits  Patient Self Care Activities:  . Attends all scheduled provider appointments . Calls provider office for new concerns or questions  Initial goal documentation   . "I need to work on my depression" (pt-stated)       Current Barriers:  . Financial constraints . Mental Health Concerns  . Social Isolation . Limited education about appropriate self-care tools to implement into his daily routine to decrease depressive symptoms*  Clinical Social Work Clinical Goal(s):  [name] Kitchen Over the next 90 days, client will work with SW to address concerns related to depression . Over the next 90 days, patient will attend all scheduled medical appointments: as needed . Over the next 90 days, patient will demonstrate improved health management independence as evidenced by implementing appropriate self-care methods into daily routine  Interventions: . Patient interviewed and appropriate assessments performed . Provided mental health counseling with regard to depression (mental  health diagnosis or concern) . Provided patient with information about depression management . Collaborated with RN Case Manager re: patient's ongoing  depressive symptoms related to low appetite . Assisted patient/caregiver with obtaining information about health plan benefits  Patient Self Care Activities:  . Attends all scheduled provider appointments . Calls provider office for new concerns or questions  Initial goal documentation   Follow Up Plan: SW will follow up with patient by phone over the next 2-3 weeks      Eula Fried, [place], MSW, [email or web address] Phone: [phone]

## 2018-08-10 ENCOUNTER — Telehealth: Payer: Self-pay

## 2018-08-11 ENCOUNTER — Ambulatory Visit: Payer: Medicaid Other

## 2018-08-12 ENCOUNTER — Ambulatory Visit: Payer: Self-pay | Admitting: Pharmacist

## 2018-08-12 ENCOUNTER — Other Ambulatory Visit: Payer: Self-pay | Admitting: Nurse Practitioner

## 2018-08-12 DIAGNOSIS — F17219 Nicotine dependence, cigarettes, with unspecified nicotine-induced disorders: Secondary | ICD-10-CM

## 2018-08-12 DIAGNOSIS — F331 Major depressive disorder, recurrent, moderate: Secondary | ICD-10-CM

## 2018-08-12 MED ORDER — FLUTICASONE-SALMETEROL 250-50 MCG/DOSE IN AEPB: 1.0000 | INHALATION_SPRAY | Freq: Two times a day (BID) | RESPIRATORY_TRACT | 5 refills | Status: DC

## 2018-08-12 NOTE — Progress Notes (Signed)
Change from Breo to Advair due to cost.

## 2018-08-12 NOTE — Patient Instructions (Signed)
Visit Information  Goals Addressed            This Visit's Progress     Patient Stated   . "I need to work on my depression" (pt-stated)       Current Barriers:  . Depression, currently limiting his drive to quit smoking. Reports concurrent symptoms of anxiety and depression . History of antidepressants per chart review:  o Trazodone- notes that he didn't derive benefit, and made him sleepy o Mirtazapine - notes he was extremely sedated and slept "all day long", did not derive any appetite stimulation benefit because he "was asleep all the time"  Pharmacist Clinical Goal(s):  [name] Kitchen Over the next 60 days, patient will work with PCP and PharmD for optimized medication management  Interventions: . Comprehensive medication review performed. While patient would benefit from appetite stimulation, reports being too sedated with low dose mirtazapine . While smoking cessation attempt may benefit from bupropion, would worry about potential for weight loss . May consider further evaluation of depression/anxiety and consider addition of SSRI/SNRI at next PCP appointment. Increased energy, decreased anxiety would likely benefit the patient's attempt to reduce tobacco consumption  Patient Self Care Activities:   Patient will continue to work with LCSW for counseling and support  Please see past updates related to this goal by clicking on the "Past Updates" button in the selected goal      . "My breathing is bad" (pt-stated)       Current Barriers:  . Significant COPD burden, exacerbated by concurrent and continued tobacco abuse.  o Patient reports adherence to Breo + Spiriva; Virgel Bouquet is not preferred on [place] Medicaid. Preferred options include Advair, Dulera, Symbicort. Was previously on Advair (was switched d/t patient assistance for Breo) o Reports continuing to smoke 1 ppd. "Quit" ~6 months ago for ~5 weeks (though notes he continued to smoke 1 cigarette at 5 pm daily as a reward), but noted that  stress and anxiety caused him to start smoking again. Quit w/o any pharmacological intervention. Endorsed use of [place] Quit Line for behavioral support. Notes that biggest barriers right now include depression/apathy and boredom from being at home during coronavirus pandemic, but states he would be willing to try to reduce # of cigarettes over the next few weeks  Pharmacist Clinical Goal(s):  [name] Kitchen Over the next 60 days, patient will work with PharmD to address needs related to optimized medication management for COPD  . Over the next 30 days, patient will attempt to reduce to 1/2 ppd to improve breathing  Interventions: . Comprehensive medication review performed.  o Recommend d/c Breo and sending prescription for Advair 250/50 BID. At PCP appointment next month, can weight risk vs benefit for discontinuation of ICS component and change to LABA/LAMA therapy; Stioloto Respimat is Medicaid preferred LABA/LAMA. Patient does have recent exacerbations that might show he would derive benefit from continuing ICS, but also recent pneumonia, and continued ICS increases this risk. Consider recheck of CBC w/diff to assess eosinophil levels and potential benefit from ICS o Can ask patient to demonstrate technique, and determine if patient still has inspiratory capacity for a dry powder inhaler such as Advair, Spiriva. Could consider changing to Respimat or nebulized formulations to see if patient derives more benefit . Patient agrees to work on reduction in tobacco use o Patient will attempt to reduce to 1/2 ppd over the next 2 weeks. Notes that he will contemplate reaching out to [employer]. Is not interested in pharmacotherapy at this  time  Patient Self Care Activities:  . Self administers medications as prescribed  Initial goal documentation        The patient verbalized understanding of instructions provided today and declined a print copy of patient instruction materials.   Plan: - PharmD will outreach  patient in ~3-4 weeks to address attempts at tobacco reduction  Catie Feliz Beam, PharmD Clinical Pharmacist [facility] [phone]

## 2018-08-12 NOTE — Chronic Care Management (AMB) (Signed)
Chronic Care Management   Follow Up Note   08/12/2018 Name: BRYLAND RULLAN MRN: [id number] DOB: [birth year]  Referred by: [name], NP Reason for referral : Chronic Care Management (Depression, COPD, tobacco abuse)   PARAM HURD is a 58 y.o. year old male who is a primary care patient of [name], [name], NP. The CCM team was consulted for assistance with chronic disease management and care coordination needs.    Contacted patient telephonically today for medication review.  Review of patient status, including review of consultants reports, relevant laboratory and other test results, and collaboration with appropriate care team members and the patient's provider was performed as part of comprehensive patient evaluation and provision of chronic care management services.    Goals Addressed            This Visit's Progress     Patient Stated   . "I need to work on my depression" (pt-stated)       Current Barriers:  . Depression, currently limiting his drive to quit smoking. Reports concurrent symptoms of anxiety and depression . History of antidepressants per chart review:  o Trazodone- notes that he didn't derive benefit, and made him sleepy o Mirtazapine - notes he was extremely sedated and slept "all day long", did not derive any appetite stimulation benefit because he "was asleep all the time"  Pharmacist Clinical Goal(s):  [name] Kitchen Over the next 60 days, patient will work with PCP and PharmD for optimized medication management  Interventions: . Comprehensive medication review performed. While patient would benefit from appetite stimulation, reports being too sedated with low dose mirtazapine . While smoking cessation attempt may benefit from bupropion, would worry about potential for weight loss . May consider further evaluation of depression/anxiety and consider addition of SSRI/SNRI at next PCP appointment. Increased energy, decreased anxiety would likely benefit the  patient's attempt to reduce tobacco consumption  Patient Self Care Activities:   Patient will continue to work with LCSW for counseling and support  Please see past updates related to this goal by clicking on the "Past Updates" button in the selected goal      . "My breathing is bad" (pt-stated)       Current Barriers:  . Significant COPD burden, exacerbated by concurrent and continued tobacco abuse.  o Patient reports adherence to Breo + Spiriva; Virgel Bouquet is not preferred on [place] Medicaid. Preferred options include Advair, Dulera, Symbicort. Was previously on Advair (was switched d/t patient assistance for Breo) o Reports continuing to smoke 1 ppd. "Quit" ~6 months ago for ~5 weeks (though notes he continued to smoke 1 cigarette at 5 pm daily as a reward), but noted that stress and anxiety caused him to start smoking again. Quit w/o any pharmacological intervention. Endorsed use of [place] Quit Line for behavioral support. Notes that biggest barriers right now include depression/apathy and boredom from being at home during coronavirus pandemic, but states he would be willing to try to reduce # of cigarettes over the next few weeks  Pharmacist Clinical Goal(s):  [name] Kitchen Over the next 60 days, patient will work with PharmD to address needs related to optimized medication management for COPD  . Over the next 30 days, patient will attempt to reduce to 1/2 ppd to improve breathing  Interventions: . Comprehensive medication review performed.  o Recommend d/c Breo and sending prescription for Advair 250/50 BID. At PCP appointment next month, can weight risk vs benefit for discontinuation of ICS component and change to LABA/LAMA  therapy; Stioloto Respimat is Medicaid preferred LABA/LAMA. Patient does have recent exacerbations that might show he would derive benefit from continuing ICS, but also recent pneumonia, and continued ICS increases this risk. Consider recheck of CBC w/diff to assess eosinophil levels and  potential benefit from ICS o Can ask patient to demonstrate technique, and determine if patient still has inspiratory capacity for a dry powder inhaler such as Advair, Spiriva. Could consider changing to Respimat or nebulized formulations to see if patient derives more benefit . Patient agrees to work on reduction in tobacco use o Patient will attempt to reduce to 1/2 ppd over the next 2 weeks. Notes that he will contemplate reaching out to [employer]. Is not interested in pharmacotherapy at this time  Patient Self Care Activities:  . Self administers medications as prescribed  Initial goal documentation         Plan: - PharmD will outreach patient in ~3-4 weeks to address attempts at tobacco reduction  Catie Feliz Beam, PharmD Clinical Pharmacist [facility] [phone]

## 2018-08-23 ENCOUNTER — Ambulatory Visit: Payer: Self-pay | Admitting: *Deleted

## 2018-08-23 ENCOUNTER — Telehealth: Payer: Self-pay

## 2018-08-23 DIAGNOSIS — E43 Unspecified severe protein-calorie malnutrition: Secondary | ICD-10-CM

## 2018-08-23 NOTE — Chronic Care Management (AMB) (Signed)
  Chronic Care Management   Note  08/23/2018 Name: Austin Blevins MRN: [id number] DOB: August 20, 1960  Unable to reach [name] when I called him today to see that he received the Ensure coupons sent to him by Dickie La LCSW. I left a HIPPA compliant voice message requesting a return call.  Follow up plan: The CM team will reach out to the patient again over the next 10 days.   Marja Kays MHA,BSN,RN,CCM Nurse Care Coordinator [facility] Care Management [phone]

## 2018-08-25 ENCOUNTER — Ambulatory Visit: Payer: Self-pay | Admitting: *Deleted

## 2018-08-25 DIAGNOSIS — E43 Unspecified severe protein-calorie malnutrition: Secondary | ICD-10-CM

## 2018-08-25 NOTE — Chronic Care Management (AMB) (Signed)
  Chronic Care Management   Note  08/25/2018 Name: BEAMON MASSIAH MRN: [id number] DOB: 07-29-1960  Goals Addressed    . "I need financial assistance" (pt-stated)       Current Barriers:  . Financial constraints . Limited social support . Limited access to food . Lacks knowledge of community resource: available financial and nutritional suport resources - patient called to report that the Ensure coupons he received were expired.   Clinical Social Work Clinical Goal(s):  [name] Kitchen Over the next 90 days, client will work with SW to address concerns related to lack of finances in order to afford nutritional supplies  . Over the next 90 days, patient will work with LCSW to address needs related to financial contstraints  . Over the next 90 days, patient will attend all scheduled medical appointments  Interventions: . Patient interviewed and appropriate assessments performed . Discussed plans with patient for ongoing care management follow up and provided patient with direct contact information for care management team . Advised patient to check mailbox for Ensure coupons that will be mailed to patient's residence - asked care management assistant to send updated coupons and provided patient with web access to instant coupon which could be printed  Patient Self Care Activities:  . Attends all scheduled provider appointments . Calls provider office for new concerns or questions I    Follow up plan: The CM team will reach out to the patient again over the next 7 days.   Marja Kays MHA,BSN,RN,CCM Nurse Care Coordinator [facility] / [facility] Care Management [phone]

## 2018-08-25 NOTE — Patient Instructions (Signed)
Visit Information  Goals Addressed    . "I need financial assistance" (pt-stated)       Current Barriers:  . Financial constraints . Limited social support . Limited access to food . Lacks knowledge of community resource: available financial and nutritional suport resources - patient called to report that the Ensure coupons he received were expired.   Clinical Social Work Clinical Goal(s):  [name] Kitchen Over the next 90 days, client will work with SW to address concerns related to lack of finances in order to afford nutritional supplies  . Over the next 90 days, patient will work with LCSW to address needs related to financial contstraints  . Over the next 90 days, patient will attend all scheduled medical appointments  Interventions: . Patient interviewed and appropriate assessments performed . Discussed plans with patient for ongoing care management follow up and provided patient with direct contact information for care management team . Advised patient to check mailbox for Ensure coupons that will be mailed to patient's residence - asked care management assistant to send updated coupons and provided patient with web access to instant coupon which could be printed  Patient Self Care Activities:  . Attends all scheduled provider appointments . Calls provider office for new concerns or questions    The patient verbalized understanding of instructions provided today and declined a print copy of patient instruction materials.   The CM team will reach out to the patient again over the next 7 days.   Marja Kays MHA,BSN,RN,CCM Nurse Care Coordinator [facility] / [facility] Care Management [phone]

## 2018-08-30 ENCOUNTER — Telehealth: Payer: Self-pay

## 2018-08-30 NOTE — Telephone Encounter (Signed)
Called patient to check up on him to see how he is doing. Pt states that he is doing ok. Having a lot of L shoulder pain. Has been doing his HEP but feels like his shoulder is worse. Still has a hard time reaching across. Gets out of breath when he walks to the bathroom at times.   Pt was recommended that home health physical therapy might be more appropriate at this time instead of using Telehealth due to his shortness of breath with light activity so that if he is performing exercises, someone is present to help him if needed for safety. Pt agreeable.

## 2018-08-30 NOTE — Therapy (Signed)
[place] [facility] PHYSICAL AND SPORTS MEDICINE [address] [place], [address] Phone: [phone]   Fax:  [phone]  Patient Details  Name: Austin Blevins MRN: [id number] Date of Birth: 06-08-1960 Referring Provider:  Aura Dials, NP  Encounter Date: 08/30/2018      Aura Dials, NP,  [name] was referred for outpatient physical therapy services.  Due to COVID-19, it will be more appropriate at this time to refer for home health therapy services.  The patient currently utilizes 2 L of O2 with activity and may need monitoring of SpO2 levels during exercises for safety. Per patient, light activity such as walking to the bathroom causes him to have shortness of breath.   Thank you,  Loralyn Freshwater PT, DPT   08/30/2018, 2:56 PM  [place] [facility] PHYSICAL AND SPORTS MEDICINE [address], [place], [address] Phone: [phone]   Fax:  [phone]

## 2018-08-31 ENCOUNTER — Ambulatory Visit: Payer: Self-pay | Admitting: Licensed Clinical Social Worker

## 2018-08-31 DIAGNOSIS — E43 Unspecified severe protein-calorie malnutrition: Secondary | ICD-10-CM

## 2018-08-31 NOTE — Chronic Care Management (AMB) (Signed)
  Chronic Care Management    Clinical Social Work General Note  08/31/2018 Name: Austin Blevins MRN: [id number] DOB: 1960/12/05  Austin Blevins is a 58 y.o. year old male who is a primary care patient of [name], [name], NP. The CCM was consulted to assist the patient with financial assistance in relation to food insecurity. LCSW completed outreach call and spoke with patient and he was informed that updated Ensure coupons have successfully been mailed out to him as of today. Patient was extremely appreciative. LCSW will follow up in a few weeks to make sure resources were successfully received.   Goals Addressed    . "I need financial assistance" (pt-stated)       Current Barriers:  . Financial constraints . Limited social support . Limited access to food . Lacks knowledge of community resource: available financial and nutritional suport resources - patient called to report that the Ensure coupons he received were expired. Next coupons mailed out today on 08/31/2018  Clinical Social Work Clinical Goal(s):  [name] Kitchen Over the next 90 days, client will work with SW to address concerns related to lack of finances in order to afford nutritional supplies  . Over the next 90 days, patient will work with LCSW to address needs related to financial contstraints  . Over the next 90 days, patient will attend all scheduled medical appointments  Interventions: . Patient interviewed and appropriate assessments performed . Discussed plans with patient for ongoing care management follow up and provided patient with direct contact information for care management team . Advised patient to check mailbox for Ensure coupons that will be mailed to patient's residence as of today.  Patient Self Care Activities:  . Attends all scheduled provider appointments . Calls provider office for new concerns or questions  Please see past updates related to this goal by clicking on the "Past Updates" button in the selected goal     Follow Up Plan: SW will follow up with patient by phone over the next 2-4 weeks to make sure coupons were successfully received      Dickie La, BSW, MSW, LCSW [facility] Care Management [place]  [facility] [email or web address] Phone: [phone]

## 2018-08-31 NOTE — Patient Instructions (Signed)
Licensed Clinical Social Worker Visit Information  Goals we discussed today:  Goals Addressed    . "I need financial assistance" (pt-stated)       Current Barriers:  . Financial constraints . Limited social support . Limited access to food . Lacks knowledge of community resource: available financial and nutritional suport resources - patient called to report that the Ensure coupons he received were expired. Next coupons mailed out today on 08/31/2018  Clinical Social Work Clinical Goal(s):  [name] Kitchen Over the next 90 days, client will work with SW to address concerns related to lack of finances in order to afford nutritional supplies  . Over the next 90 days, patient will work with LCSW to address needs related to financial contstraints  . Over the next 90 days, patient will attend all scheduled medical appointments  Interventions: . Patient interviewed and appropriate assessments performed . Discussed plans with patient for ongoing care management follow up and provided patient with direct contact information for care management team . Advised patient to check mailbox for Ensure coupons that will be mailed to patient's residence as of today.  Patient Self Care Activities:  . Attends all scheduled provider appointments . Calls provider office for new concerns or questions  Please see past updates related to this goal by clicking on the "Past Updates" button in the selected goal    Materials Provided: Yes: ensure coupons mailed as of 08/31/2018  Follow Up Plan: SW will follow up with patient by phone over the next 2-4 weeks  Dickie La, BSW, MSW, LCSW [facility] Care Management [place]  [facility] [email or web address] Phone: [phone]

## 2018-09-01 ENCOUNTER — Telehealth: Payer: Self-pay

## 2018-09-01 ENCOUNTER — Other Ambulatory Visit: Payer: Self-pay | Admitting: Nurse Practitioner

## 2018-09-02 ENCOUNTER — Ambulatory Visit: Payer: Self-pay | Admitting: *Deleted

## 2018-09-02 ENCOUNTER — Other Ambulatory Visit: Payer: Self-pay | Admitting: Nurse Practitioner

## 2018-09-02 DIAGNOSIS — M67912 Unspecified disorder of synovium and tendon, left shoulder: Secondary | ICD-10-CM

## 2018-09-02 DIAGNOSIS — J439 Emphysema, unspecified: Secondary | ICD-10-CM

## 2018-09-02 NOTE — Patient Instructions (Signed)
Thank you allowing the Chronic Care Management Team to be a part of your care! It was a pleasure speaking with you today!  1. [facility] will be calling you to set up Home Health PT 2. Call Dr. Carren Rang office if you feel your breathing symptoms become worse  CCM (Chronic Care Management) Team   Rogena Deupree RN, BSN Nurse Care Coordinator  [phone]  Catie [facility] PharmD  Clinical Pharmacist  [phone]  Dickie La LCSW Clinical Social Worker [phone]  Goals Addressed            This Visit's Progress   . Need Home Health PT (pt-stated)       Current Barriers:  [name] Kitchen Knowledge Deficits related to obtaining home health PT  Nurse Case Manager Clinical Goal(s):  [name] Kitchen Over the next 30 days, patient will work with Home Health PT  to address needs related to Left shoulder Pain  Interventions:  . Provided education to patient re: Home Health PT process . Collaborated with PCP regarding HHPT order . Discussed plans with patient for ongoing care management follow up and provided patient with direct contact information for care management team  . Contacted [facility] to initiate HHPT for patient  Patient Self Care Activities:  . Attends all scheduled provider appointments . Performs ADL's independently  Initial goal documentation         The patient verbalized understanding of instructions provided today and declined a print copy of patient instruction materials.   The CM team will reach out to the patient again over the next 30 days.  The patient has been provided with contact information for the chronic care management team and has been advised to call with any health related questions or concerns.

## 2018-09-02 NOTE — Chronic Care Management (AMB) (Signed)
  Chronic Care Management   Follow Up Note   09/02/2018 Name: Austin Blevins MRN: [id number] DOB: 08-05-1960  Referred by: [name], NP Reason for referral : Chronic Care Management (COPD) and Care Coordination (HHPT for L Shoulder)   Austin Blevins is a 58 y.o. year old male who is a primary care patient of [name], [name], NP. The CCM team was consulted for assistance with chronic disease management and care coordination needs.    Coordinated HHPT with patient, provider and [facility] company. Patient states, "My breathing is up and down, some days I am ok and others I can't do anything without being out of breath." Discussed the potential of completing a pulmonary rehab program with pcp and patient, both are agreeable. Will follow up with patient in a few weeks to revisit this option.   Review of patient status, including review of consultants reports, relevant laboratory and other test results, and collaboration with appropriate care team members and the patient's provider was performed as part of comprehensive patient evaluation and provision of chronic care management services.    Goals Addressed            This Visit's Progress   . Need Home Health PT (pt-stated)       Current Barriers:  [name] Kitchen Knowledge Deficits related to obtaining home health PT  Nurse Case Manager Clinical Goal(s):  [name] Kitchen Over the next 30 days, patient will work with Home Health PT  to address needs related to Left shoulder Pain  Interventions:  . Provided education to patient re: Home Health PT process . Collaborated with PCP regarding HHPT order . Discussed plans with patient for ongoing care management follow up and provided patient with direct contact information for care management team  . Contacted [facility] to initiate HHPT for patient  Patient Self Care Activities:  . Attends all scheduled provider appointments . Performs ADL's independently  Initial goal documentation          The  CM team will reach out to the patient again over the next 30 days.  The patient has been provided with contact information for the chronic care management team and has been advised to call with any health related questions or concerns.   Ma Rings Sosie Gato RN, BSN Nurse Case Education officer, [facility]  [phone]) Business Mobile

## 2018-09-02 NOTE — Progress Notes (Signed)
Home Health PT order

## 2018-09-06 ENCOUNTER — Ambulatory Visit: Payer: Self-pay | Admitting: Pharmacist

## 2018-09-06 DIAGNOSIS — J439 Emphysema, unspecified: Secondary | ICD-10-CM

## 2018-09-06 DIAGNOSIS — F17219 Nicotine dependence, cigarettes, with unspecified nicotine-induced disorders: Secondary | ICD-10-CM

## 2018-09-06 NOTE — Patient Instructions (Signed)
Visit Information  Goals Addressed            This Visit's Progress     Patient Stated   . "My breathing is bad" (pt-stated)       Current Barriers:  . Significant COPD burden, exacerbated by concurrent and continued tobacco abuse.  o Patient reports he was able to pick up Advair; endorses no change in breathing with switch from [facility] to Advair . Tobacco Abuse - patient notes that he is struggling with decreasing nicotine consumption; still smoking ~ 1 ppd, notes that he doesn't have a lot of other ways to entertain himself right now (notes he cannot read well, so he mostly watches TV all day) o Not interested in contacting Quit Line right now  Pharmacist Clinical Goal(s):  [name] Kitchen Over the next 60 days, patient will work with PharmD to address needs related to optimized medication management for COPD  . Over the next 30 days, patient will attempt to reduce to 1/2 ppd to improve breathing  Interventions: . Patient will continue to work on reduction in tobacco use. Will try to think of things to do to occupy his time.   Patient Self Care Activities:  . Self administers medications as prescribed  Please see past updates related to this goal by clicking on the "Past Updates" button in the selected goal         The patient verbalized understanding of instructions provided today and declined a print copy of patient instruction materials.   Plan:  - Will coordinate with CCM RN to see if anything else needs to be done by the patient to start HHPT - PharmD will outreach patient in the next 3-4 weeks to follow up on breathing, smoking cessation, and address any medication management concerns  Catie Feliz Beam, PharmD Clinical Pharmacist [facility] [phone]

## 2018-09-06 NOTE — Chronic Care Management (AMB) (Signed)
  Chronic Care Management   Follow Up Note   09/06/2018 Name: Austin Blevins MRN: [id number] DOB: Jul 11, 1960  Referred by: [name], NP Reason for referral : No chief complaint on file.   Austin Blevins is a 58 y.o. year old male who is a primary care patient of [name], [name], NP. The CCM team was consulted for assistance with chronic disease management and care coordination needs.    Contacted patient telephonically to follow up on medication management. He notes significant pain with his left arm/shoulder. We discussed that HHPT was in the process of being set up for him.  Review of patient status, including review of consultants reports, relevant laboratory and other test results, and collaboration with appropriate care team members and the patient's provider was performed as part of comprehensive patient evaluation and provision of chronic care management services.    Goals Addressed            This Visit's Progress     Patient Stated   . "My breathing is bad" (pt-stated)       Current Barriers:  . Significant COPD burden, exacerbated by concurrent and continued tobacco abuse.  o Patient reports he was able to pick up Advair; endorses no change in breathing with switch from [facility] to Advair . Tobacco Abuse - patient notes that he is struggling with decreasing nicotine consumption; still smoking ~ 1 ppd, notes that he doesn't have a lot of other ways to entertain himself right now (notes he cannot read well, so he mostly watches TV all day) o Not interested in contacting Quit Line right now  Pharmacist Clinical Goal(s):  [name] Kitchen Over the next 60 days, patient will work with PharmD to address needs related to optimized medication management for COPD  . Over the next 30 days, patient will attempt to reduce to 1/2 ppd to improve breathing  Interventions: . Patient will continue to work on reduction in tobacco use. Will try to think of things to do to occupy his time.   Patient  Self Care Activities:  . Self administers medications as prescribed  Please see past updates related to this goal by clicking on the "Past Updates" button in the selected goal          Plan:  - Will coordinate with CCM RN to see if anything else needs to be done by the patient to start HHPT - PharmD will outreach patient in the next 3-4 weeks to follow up on breathing, smoking cessation, and address any medication management concerns  Catie Feliz Beam, PharmD Clinical Pharmacist [facility] [phone]

## 2018-09-07 ENCOUNTER — Telehealth: Payer: Self-pay | Admitting: Nurse Practitioner

## 2018-09-07 NOTE — Telephone Encounter (Signed)
Thank you :)

## 2018-09-07 NOTE — Telephone Encounter (Signed)
Copied from CRM [id number]. Topic: Quick Communication - Home Health Verbal Orders >> Sep 07, 2018  8:04 AM Jaquita Rector A wrote: Caller/Agency: Elmer Bales / [facility] Callback Number: [phone] Requesting OT/PT/Skilled Nursing/Social Work/Speech Therapy: Physical Therapy Frequency:Once week for 1 week, Twice a week for 1 week, Once a week for 2 weeks

## 2018-09-07 NOTE — Telephone Encounter (Signed)
I tried calling number attached for Irving Burton, but it states "Thank you for calling command. You have reached an unassigned number". Do you mind finding out how to get a hold of Irving Burton and providing verbal order from me for these needs.  Thank you.

## 2018-09-07 NOTE — Telephone Encounter (Signed)
Tried to find out how to contact Irving Burton, spoke to someone in [facility] office and left a message with this person for Irving Burton to return call to the office. Irving Burton return my call and Jolene's message about verbal order relayed.

## 2018-09-12 ENCOUNTER — Ambulatory Visit: Payer: Self-pay | Admitting: Licensed Clinical Social Worker

## 2018-09-12 ENCOUNTER — Other Ambulatory Visit: Payer: Self-pay

## 2018-09-12 DIAGNOSIS — E43 Unspecified severe protein-calorie malnutrition: Secondary | ICD-10-CM

## 2018-09-12 NOTE — Chronic Care Management (AMB) (Signed)
  Care Management Note   Austin Blevins is a 58 y.o. year old male who is a primary care patient of [name], [name], NP. The CM team was consulted for assistance with chronic disease management and care coordination.   Review of patient status, including review of consultants reports, relevant laboratory and other test results, and collaboration with appropriate care team members and the patient's provider was performed as part of comprehensive patient evaluation and provision of chronic care management services.   Goals Addressed    . "I need financial assistance" (pt-stated)       Current Barriers:  . Financial constraints . Limited social support . Limited access to food . Lacks knowledge of community resource: available financial and nutritional suport resources - patient confirmed that he received Ensure coupons in the mail successfully   Clinical Social Work Clinical Goal(s):  [name] Kitchen Over the next 90 days, client will work with SW to address concerns related to lack of finances in order to afford nutritional supplies  . Over the next 90 days, patient will work with LCSW to address needs related to financial contstraints  . Over the next 90 days, patient will attend all scheduled medical appointments  Interventions: . Patient interviewed and appropriate assessments performed . Discussed plans with patient for ongoing care management follow up and provided patient with direct contact information for care management team . Added patient to the [employer] Ensure program to gain coupons/assistance/information  . Patient confirms he received Ensure coupons that were mailed out by [facility] program . LCSW provided education on food insecurity/financial assistance resources within the area   Patient Self Care Activities:  . Attends all scheduled provider appointments . Calls provider office for new concerns or questions  Please see past updates related to this goal by clicking on the "Past  Updates" button in the selected goal    Follow Up Plan: LCSW will follow up with 30 days to address mental health needs.   Dickie La, BSW, MSW, LCSW [facility] [place]  [facility] [email or web address] Phone: [phone]

## 2018-09-12 NOTE — Patient Instructions (Signed)
Licensed Clinical Social Worker Visit Information  Goals we discussed today:  Goals Addressed    . "I need financial assistance" (pt-stated)       Current Barriers:  . Financial constraints . Limited social support . Limited access to food . Lacks knowledge of community resource: available financial and nutritional suport resources - patient confirmed that he received [employer] coupons in the mail successfully   Clinical Social Work Clinical Goal(s):  [name] Kitchen Over the next 90 days, client will work with SW to address concerns related to lack of finances in order to afford nutritional supplies  . Over the next 90 days, patient will work with LCSW to address needs related to financial contstraints  . Over the next 90 days, patient will attend all scheduled medical appointments  Interventions: . Patient interviewed and appropriate assessments performed . Discussed plans with patient for ongoing care management follow up and provided patient with direct contact information for care management team . Added patient to the Health Mat[employer] program to gain coupons/assistance/information  . Patient confirms he received Ensure coupons that were mailed out by [facility] program . LCSW provided education on food insecurity/financial assistance resources within the area   Patient Self Care Activities:  . Attends all scheduled provider appointments . Calls provider office for new concerns or questions  Please see past updates related to this goal by clicking on the "Past Updates" button in the selected goal      Materials Provided: Verbal education about financial and food support resources provided by phone  Follow Up Plan: SW will follow up with patient by phone over the next 30 days   SIGNATURE

## 2018-09-14 ENCOUNTER — Ambulatory Visit: Payer: Medicaid Other | Admitting: Family Medicine

## 2018-09-20 ENCOUNTER — Ambulatory Visit (INDEPENDENT_AMBULATORY_CARE_PROVIDER_SITE_OTHER): Payer: Medicaid Other | Admitting: Nurse Practitioner

## 2018-09-20 ENCOUNTER — Other Ambulatory Visit: Payer: Self-pay

## 2018-09-20 ENCOUNTER — Encounter: Payer: Self-pay | Admitting: Nurse Practitioner

## 2018-09-20 VITALS — BP 117/70 | HR 113 | Ht 67.0 in | Wt 122.0 lb

## 2018-09-20 DIAGNOSIS — G2581 Restless legs syndrome: Secondary | ICD-10-CM | POA: Diagnosis not present

## 2018-09-20 DIAGNOSIS — J439 Emphysema, unspecified: Secondary | ICD-10-CM | POA: Diagnosis not present

## 2018-09-20 DIAGNOSIS — F17219 Nicotine dependence, cigarettes, with unspecified nicotine-induced disorders: Secondary | ICD-10-CM

## 2018-09-20 DIAGNOSIS — F331 Major depressive disorder, recurrent, moderate: Secondary | ICD-10-CM

## 2018-09-20 MED ORDER — ROPINIROLE HCL 0.5 MG PO TABS: 1.5000 mg | ORAL_TABLET | Freq: Every day | ORAL | 2 refills | Status: DC

## 2018-09-20 MED ORDER — SERTRALINE HCL 25 MG PO TABS: 25.0000 mg | ORAL_TABLET | Freq: Every day | ORAL | 3 refills | Status: DC

## 2018-09-20 NOTE — Assessment & Plan Note (Signed)
Chronic, PHQ9 = 14.  Script for Zoloft 25 MG sent.  He denies SI/HI.  Return in 4 weeks for mood f/u and adjust medication dose as needed.

## 2018-09-20 NOTE — Patient Instructions (Signed)

## 2018-09-20 NOTE — Progress Notes (Signed)
BP 117/70   Pulse (!) 113   Ht  (1.702 m)   Wt 122 lb (55.3 kg)   BMI 19.11 kg/m    Subjective:    Patient ID: Austin Blevins, male    DOB: [birth year], 58 y.o.   MRN: [id number]  HPI: Austin Blevins is a 58 y.o. male  Chief Complaint  Patient presents with  . COPD    f/u  . Depression  . RLS    pt states his RLS is getting worse. pt states he would like to increse his requip    . This visit was completed via telephone due to the restrictions of the COVID-19 pandemic. All issues as above were discussed and addressed but no physical exam was performed. If it was felt that the patient should be evaluated in the office, they were directed there. The patient verbally consented to this visit. Patient was unable to complete an audio/visual visit due to Lack of equipment. Due to the catastrophic nature of the COVID-19 pandemic, this visit was done through audio contact only. . Location of the patient: home . Location of the provider: home . Those involved with this call:  . Provider: Aura Dials, DNP . CMA: Elton Sin, CMA . Front Desk/Registration: Landis Martins  . Time spent on call: 15 minutes on the phone discussing health concerns. 10 minutes total spent in review of patient's record and preparation of their chart. I verified patient identity using two factors (patient name and date of birth). Patient consents verbally to being seen via telemedicine visit today.   COPD Currently smokes one pack a day and continues to attempt to cut back.  He is dependent on O2, 2L [place].  Does endorse chronic cough, worse in the morning at baseline.  Denies any recent fever, chills, increased cough, or increased SOB.  COPD status: stable Satisfied with current treatment?: yes Oxygen use: yes Dyspnea frequency: occasional at baseline, not worse Cough frequency: chronic Rescue inhaler frequency:  2-3 times a week Limitation of activity: yes Productive cough: at times, not today Last  Spirometry: will obtain next visit Pneumovax: Up to Date Influenza: Up to Date   DEPRESSION Followed by CCM team and pharmacist recommended addition of SSRI/SNRI for patient mood, which in turn may help with smoking cessation.  Discussed with patient his mood today.  He does endorse depressed mood, as he has severe COPD and no strong support system locally.  He denies SI/HI.  Is interested in trying a medication for mood.  Will start with Zoloft at low dose, educated patient on this medication. Mood status: uncontrolled Satisfied with current treatment?: no current treatment Symptom severity: moderate  Depressed mood: yes Anxious mood: no Anhedonia: no Significant weight loss or gain: no Insomnia: yes hard to stay asleep Fatigue: yes Feelings of worthlessness or guilt: yes Impaired concentration/indecisiveness: yes Suicidal ideations: no Hopelessness: yes Crying spells: no Depression screen [facility] 2/9 09/20/2018 08/09/2018 07/19/2018 06/24/2017  Decreased Interest Down, Depressed, Hopeless PHQ - 2 Score Altered sleeping Tired, decreased energy Change in appetite Feeling bad or failure about yourself  Trouble concentrating 1 0 0 1  Moving slowly or fidgety/restless 0 0 3 2  Suicidal thoughts 0 0 0 0  PHQ-9 Score Difficult doing work/chores -  Somewhat difficult Somewhat difficult -    RESTLESS LEG SYNDROME: On Requip for RLS 0.5MG  TID prescribed by previous provider.  He reports this dosing is not working.  Inquired into when patient has discomfort.  He reports he has no discomfort during day time hours, his discomfort is in the late evening and at night.  Discussed that dosing for Requip is often schedule only at night, he reports he has taken two 0.5 MG (1 MG) at night before and this did help some, "almost got me through the night but not quite".  Discussed with him trial of changing dose to at night only, 1.5 MG.   He feels this may work better because the 1 MG helped at night and almost helped him through the entire night.   Duration: chronic Discomfort description:  "like bugs in my legs" Pain: yes Location: enitre leg Bilateral: yes Symmetric: yes Severity: 6/10 at night Onset:  gradual Frequency:  constant- every night Symptoms only occur while legs at rest: yes Sudden unintentional leg jerking: yes Bed partner bothered by leg movements: unknown LE numbness: no Decreased sensation: yes Weakness: yes Insomnia: occasional Daytime somnolence: no Fatigue: yes Alleviating factors: Requip at times Aggravating factors: unknown Status: worse  Relevant past medical, surgical, family and social history reviewed and updated as indicated. Interim medical history since our last visit reviewed. Allergies and medications reviewed and updated.  Review of Systems  Constitutional: Negative for activity change, diaphoresis, fatigue and fever.  Respiratory: Positive for cough (chronic at baseline). Negative for chest tightness, shortness of breath and wheezing.   Cardiovascular: Negative for chest pain, palpitations and leg swelling.  Gastrointestinal: Negative for abdominal distention, abdominal pain, constipation, diarrhea, nausea and vomiting.  Endocrine: Negative for cold intolerance, heat intolerance, polydipsia, polyphagia and polyuria.  Musculoskeletal: Negative.   Skin: Negative.   Neurological: Negative for dizziness, syncope, weakness, light-headedness, numbness and headaches.  Psychiatric/Behavioral: Positive for decreased concentration and sleep disturbance.    Per HPI unless specifically indicated above     Objective:    BP 117/70   Pulse (!) 113   Ht 5\' 7"  (1.702 m)   Wt 122 lb (55.3 kg)   BMI 19.11 kg/m   Wt Readings from Last 3 Encounters:  09/20/18 122 lb (55.3 kg)  07/19/18 123 lb 4 oz (55.9 kg)  06/30/18 124 lb 1.6 oz (56.3 kg)    Physical Exam   Unable to obtain due  to telephone visit only.  Results for orders placed or performed in visit on 07/19/18  [facility] HEPATITIS C SCREENING LAB  Result Value Ref Range   HM Hepatitis Screen Negative - Patient Reported       Assessment & Plan:   Problem List Items Addressed This Visit      Respiratory   COPD (chronic obstructive pulmonary disease) (HCC)    Chronic, ongoing, severe with O2 depedence.  Continue current medication regimen.  Spirometry next visit.        Nervous and Auditory   Nicotine dependence, cigarettes, w unsp disorders    I have recommended complete cessation of tobacco use. I have discussed various options available for assistance with tobacco cessation including over the counter methods (Nicotine gum, patch and lozenges). We also discussed prescription options (Chantix, Nicotine Inhaler / Nasal Spray). The patient is not interested in pursuing any prescription tobacco cessation options at this time.        Other   Depression, major, recurrent, moderate (HCC) - Primary    Chronic,  PHQ9 = 14.  Script for Zoloft 25 MG sent.  He denies SI/HI.  Return in 4 weeks for mood f/u and adjust medication dose as needed.      Relevant Medications   sertraline (ZOLOFT) 25 MG tablet   Restless legs syndrome (RLS)    Chronic, ongoing.  Switch from TID to QHS dosing as patient symptoms specifically at HS.  Return in 4 weeks for f/u on pain and adjust dose as needed.         I discussed the assessment and treatment plan with the patient. The patient was provided an opportunity to ask questions and all were answered. The patient agreed with the plan and demonstrated an understanding of the instructions.   The patient was advised to call back or seek an in-person evaluation if the symptoms worsen or if the condition fails to improve as anticipated.   I provided 15 minutes of time during this encounter.  Follow up plan: Return in about 4 weeks (around 10/18/2018) for Mood, RLS, and COPD (needs  spirometry).

## 2018-09-20 NOTE — Assessment & Plan Note (Signed)
I have recommended complete cessation of tobacco use. I have discussed various options available for assistance with tobacco cessation including over the counter methods (Nicotine gum, patch and lozenges). We also discussed prescription options (Chantix, Nicotine Inhaler / Nasal Spray). The patient is not interested in pursuing any prescription tobacco cessation options at this time.  

## 2018-09-20 NOTE — Assessment & Plan Note (Signed)
Chronic, ongoing.  Switch from TID to QHS dosing as patient symptoms specifically at HS.  Return in 4 weeks for f/u on pain and adjust dose as needed.

## 2018-09-20 NOTE — Assessment & Plan Note (Signed)
Chronic, ongoing, severe with O2 depedence.  Continue current medication regimen.  Spirometry next visit.

## 2018-09-27 ENCOUNTER — Other Ambulatory Visit: Payer: Self-pay | Admitting: Nurse Practitioner

## 2018-09-27 ENCOUNTER — Telehealth: Payer: Self-pay | Admitting: Nurse Practitioner

## 2018-09-27 ENCOUNTER — Telehealth: Payer: Self-pay

## 2018-09-27 MED ORDER — SERTRALINE HCL 25 MG PO TABS: 25.0000 mg | ORAL_TABLET | Freq: Every day | ORAL | 3 refills | Status: DC

## 2018-09-27 NOTE — Telephone Encounter (Signed)
Fixed it.  Was sent to med management clinic.  Resent to [facility]

## 2018-09-27 NOTE — Telephone Encounter (Signed)
Copied from CRM [id number]. Topic: Quick Communication - Rx Refill/Question >> Sep 27, 2018  3:52 PM Baldo Daub L wrote: Medication: sertraline (ZOLOFT) 25 MG tablet   Pt states that this medication needed to be sent to [facility] but didn't go there.

## 2018-09-28 ENCOUNTER — Ambulatory Visit: Payer: Self-pay | Admitting: *Deleted

## 2018-09-28 ENCOUNTER — Telehealth: Payer: Self-pay | Admitting: Nurse Practitioner

## 2018-09-28 ENCOUNTER — Other Ambulatory Visit: Payer: Self-pay | Admitting: Nurse Practitioner

## 2018-09-28 DIAGNOSIS — J439 Emphysema, unspecified: Secondary | ICD-10-CM

## 2018-09-28 DIAGNOSIS — R1310 Dysphagia, unspecified: Secondary | ICD-10-CM

## 2018-09-28 DIAGNOSIS — M67912 Unspecified disorder of synovium and tendon, left shoulder: Secondary | ICD-10-CM

## 2018-09-28 NOTE — Telephone Encounter (Signed)
Copied from CRM [id number]. Topic: General - Other >> Sep 28, 2018  1:43 PM Elliot Gault wrote: Jethro Bolus name: Irving Burton  Relation to pt: Physical Therapist from [facility] Call back number: [phone]   Reason for call:   PT requesting speech therapy evaluation due to lost of appetite, short of breath while wile eating and exp. Coughing epidsode after he eating, please advise

## 2018-09-28 NOTE — Chronic Care Management (AMB) (Signed)
  Chronic Care Management   Follow Up Note   09/28/2018 Name: Austin Blevins MRN: [id number] DOB: [birth year]  Referred by: [name], NP Reason for referral : Chronic Care Management   Austin Blevins is a 58 y.o. year old male who is a primary care patient of [name], [name], NP. The CCM team was consulted for assistance with chronic disease management and care coordination needs.    Review of patient status, including review of consultants reports, relevant laboratory and other test results, and collaboration with appropriate care team members and the patient's provider was performed as part of comprehensive patient evaluation and provision of chronic care management services.    Goals Addressed            This Visit's Progress   . Need Home Health PT (pt-stated)       Current Barriers:  [name] Kitchen Knowledge Deficits related to obtaining home health PT  Nurse Case Manager Clinical Goal(s):  [name] Kitchen Over the next 30 days, patient will work with Home Health PT  to address needs related to Left shoulder Pain  Interventions:  . Provided education to patient re: Home Health PT process . Collaborated with PCP regarding HHPT order . Discussed plans with patient for ongoing care management follow up and provided patient with direct contact information for care management team  . Contacted [facility] to initiate HHPT for patient . Patient continues with HHPT, has not seen much improvement to shoulder . Discussed pulmonary rehab [facility] and patient interested if he is able to qualify.  Patient Self Care Activities:  . Attends all scheduled provider appointments . Performs ADL's independently  Please see past updates related to this goal by clicking on the "Past Updates" button in the selected goal           The CM team will reach out to the patient again over the next 30 days.  The patient has been provided with contact information for the chronic care management team and has been  advised to call with any health related questions or concerns.    Austin Rings Cahlil Sattar RN, BSN Nurse Case Education officer, [facility]  [phone]) Business Mobile

## 2018-09-28 NOTE — Addendum Note (Signed)
Addended by: Aura Dials T on: 09/28/2018 05:13 PM   Modules accepted: Orders

## 2018-09-28 NOTE — Telephone Encounter (Signed)
Orders placed and will have CMA call and tell them about orders placed.

## 2018-09-28 NOTE — Patient Instructions (Signed)
Thank you allowing the Chronic Care Management Team to be a part of your care! It was a pleasure speaking with you today! CCM (Chronic Care Management) Team   Douglass Dunshee RN, BSN Nurse Care Coordinator  [phone]  Catie [facility] PharmD  Clinical Pharmacist  [phone]  Dickie La LCSW Clinical Social Worker [phone]  Goals Addressed            This Visit's Progress   . Need Home Health PT (pt-stated)       Current Barriers:  [name] Kitchen Knowledge Deficits related to obtaining home health PT  Nurse Case Manager Clinical Goal(s):  [name] Kitchen Over the next 30 days, patient will work with Home Health PT  to address needs related to Left shoulder Pain  Interventions:  . Provided education to patient re: Home Health PT process . Collaborated with PCP regarding HHPT order . Discussed plans with patient for ongoing care management follow up and provided patient with direct contact information for care management team  . Contacted [facility] to initiate HHPT for patient . Patient continues with HHPT, has not seen much improvement to shoulder . Discussed pulmonary rehab [facility] and patient interested if he is able to qualify.  Patient Self Care Activities:  . Attends all scheduled provider appointments . Performs ADL's independently  Please see past updates related to this goal by clicking on the "Past Updates" button in the selected goal          The patient verbalized understanding of instructions provided today and declined a print copy of patient instruction materials.   The CM team will reach out to the patient again over the next 30 days.  The patient has been provided with contact information for the chronic care management team and has been advised to call with any health related questions or concerns. 25

## 2018-09-28 NOTE — Progress Notes (Signed)
Refer to speech therapy per PT request and pulmonary for severe COPD + GI for dysphagia.

## 2018-09-28 NOTE — Telephone Encounter (Signed)
Placed referrals

## 2018-09-29 NOTE — Telephone Encounter (Signed)
Order given, patient notified.

## 2018-09-29 NOTE — Addendum Note (Signed)
Addended by: Aura Dials T on: 09/29/2018 11:53 AM   Modules accepted: Orders

## 2018-09-30 ENCOUNTER — Ambulatory Visit: Payer: Self-pay | Admitting: Pharmacist

## 2018-09-30 ENCOUNTER — Other Ambulatory Visit: Payer: Self-pay | Admitting: Nurse Practitioner

## 2018-09-30 DIAGNOSIS — J439 Emphysema, unspecified: Secondary | ICD-10-CM

## 2018-09-30 DIAGNOSIS — G2581 Restless legs syndrome: Secondary | ICD-10-CM

## 2018-09-30 DIAGNOSIS — F331 Major depressive disorder, recurrent, moderate: Secondary | ICD-10-CM

## 2018-09-30 NOTE — Chronic Care Management (AMB) (Signed)
Chronic Care Management   Follow Up Note   09/30/2018 Name: Austin Blevins MRN: [id number] DOB: Dec 19, 1960  Referred by: Marjie Skiffannady, Jolene T, NP Reason for referral : No chief complaint on file.   Austin BetterBarry K Lupinacci is a 58 y.o. year old male who is a primary care patient of Blevins, Dorie RankJolene T, NP. The CCM team was consulted for assistance with chronic disease management and care coordination needs.    Contacted patient to follow up on recent medication changes.   Review of patient status, including review of consultants reports, relevant laboratory and other test results, and collaboration with appropriate care team members and the patient's provider was performed as part of comprehensive patient evaluation and provision of chronic care management services.    Goals Addressed            This Visit's Progress     Patient Stated   . "I have a lot of medications" (pt-stated)       Current Barriers:  . Polypharmacy - patient notes that he had a few medication changes prescribed at recent visit with Austin DialsJolene [name], NP o Ropinirole was changed from TID dosing to 1.5 mg (3 tablets of 0.5 mg) QPM. Patient notes that he has been taking 1.5 mg QPM, but that he is now also having RLS symptoms first thing in the morning, so has been taking a fourth tablet (0.5 mg) in the morning o Sertraline 25 mg started for depression. Patient notes that he has not picked this up yet.   Pharmacist Clinical Goal(s):  [name] Kitchen. Over the next 30 days, patient will work with PharmD and PCP to address needs related to optmized medication management  Interventions: . Comprehensive medication review performed.  . Informed patient that I would let PCP know he has felt the need to take 4 tablets daily (1 QAM and 3 QPM) due to occurrence of morning RLS symptoms. Explained that his current prescription would run out too soon if he continues to take 4/day . Encouraged patient to pick up sertraline. Counseled on common side effects,  reiterated that it can take a few weeks to see therapeutic benefit from antidepressants, and to continue taking consistently for the next few weeks. Patient verbalized understanding.   Patient Self Care Activities:  . Self administers medications as prescribed  Initial goal documentation     . "My breathing is bad" (pt-stated)       Current Barriers:  . Significant COPD burden, exacerbated by concurrent and continued tobacco abuse. Prescribed Spiriva + Advair + albuterol nebs and HFA PRN o Upon medication review, patient realized that he does not have the purple Advair inhaler  Pharmacist Clinical Goal(s):  [name] Kitchen. Over the next 60 days, patient will work with PharmD to address needs related to optimized medication management for COPD   Interventions: . [facility] prior to obtaining Medicaid coverage. [facility]. Will collaborate with Austin DialsJolene [name], NP to have refill sent to [facility].  . Reviewed controller vs reliever inhaler therapies. Reviewed that Spiriva + Advair contains 3 kinds of inhaled therapies to help with breathing. He verbalized understanding.   Patient Self Care Activities:  . Self administers medications as prescribed  Please see past updates related to this goal by clicking on the "Past Updates" button in the selected goal          Plan:  - Will collaborate with PCP on medication needs above - Will  outreach patient in 3-4 weeks to discuss tolerability/efficacy of sertraline therapy.   Catie Feliz Beam, PharmD Clinical Pharmacist [facility] [phone]

## 2018-09-30 NOTE — Patient Instructions (Signed)
Visit Information  Goals Addressed            This Visit's Progress     Patient Stated   . "I have a lot of medications" (pt-stated)       Current Barriers:  . Polypharmacy - patient notes that he had a few medication changes prescribed at recent visit with Aura Dials, NP o Ropinirole was changed from TID dosing to 1.5 mg (3 tablets of 0.5 mg) QPM. Patient notes that he has been taking 1.5 mg QPM, but that he is now also having RLS symptoms first thing in the morning, so has been taking a fourth tablet (0.5 mg) in the morning o Sertraline 25 mg started for depression. Patient notes that he has not picked this up yet.   Pharmacist Clinical Goal(s):  [name] Kitchen Over the next 30 days, patient will work with PharmD and PCP to address needs related to optmized medication management  Interventions: . Comprehensive medication review performed.  . Informed patient that I would let PCP know he has felt the need to take 4 tablets daily (1 QAM and 3 QPM) due to occurrence of morning RLS symptoms. Explained that his current prescription would run out too soon if he continues to take 4/day . Encouraged patient to pick up sertraline. Counseled on common side effects, reiterated that it can take a few weeks to see therapeutic benefit from antidepressants, and to continue taking consistently for the next few weeks. Patient verbalized understanding.   Patient Self Care Activities:  . Self administers medications as prescribed  Initial goal documentation     . "My breathing is bad" (pt-stated)       Current Barriers:  . Significant COPD burden, exacerbated by concurrent and continued tobacco abuse. Prescribed Spiriva + Advair + albuterol nebs and HFA PRN o Upon medication review, patient realized that he does not have the purple Advair inhaler  Pharmacist Clinical Goal(s):  [name] Kitchen Over the next 60 days, patient will work with PharmD to address needs related to optimized medication management for COPD    Interventions: . Patient previously receiving [facility] prior to obtaining Medicaid coverage. Appears Advair rx was sent to [facility]. Will collaborate with Aura Dials, NP to have refill sent to [facility].  . Reviewed controller vs reliever inhaler therapies. Reviewed that Spiriva + Advair contains 3 kinds of inhaled therapies to help with breathing. He verbalized understanding.   Patient Self Care Activities:  . Self administers medications as prescribed  Please see past updates related to this goal by clicking on the "Past Updates" button in the selected goal         The patient verbalized understanding of instructions provided today and declined a print copy of patient instruction materials.   Plan:  - Will collaborate with PCP on medication needs above - Will outreach patient in 3-4 weeks to discuss tolerability/efficacy of sertraline therapy.   Catie Feliz Beam, PharmD Clinical Pharmacist [facility] [phone]

## 2018-10-03 ENCOUNTER — Other Ambulatory Visit: Payer: Self-pay | Admitting: Nurse Practitioner

## 2018-10-03 MED ORDER — ROPINIROLE HCL 0.5 MG PO TABS: ORAL_TABLET | ORAL | 2 refills | Status: DC

## 2018-10-03 MED ORDER — FLUTICASONE-SALMETEROL 250-50 MCG/DOSE IN AEPB: 1.0000 | INHALATION_SPRAY | Freq: Two times a day (BID) | RESPIRATORY_TRACT | 5 refills | Status: DC

## 2018-10-03 NOTE — Progress Notes (Signed)
Requip prescription changed due to patient needs and Advair refill sent.

## 2018-10-04 ENCOUNTER — Telehealth: Payer: Self-pay

## 2018-10-05 ENCOUNTER — Telehealth: Payer: Self-pay | Admitting: Nurse Practitioner

## 2018-10-05 NOTE — Telephone Encounter (Signed)
Copied from CRM [id number]. Topic: Quick Communication - Home Health Verbal Orders >> Oct 05, 2018  3:28 PM Randol Kern wrote: Caller/Agency: Paulla Fore, [facility]  Callback Number: [phone] Requesting OT/PT/Skilled Nursing/Social Work/Speech Therapy: Amy called to report that pt is swallowing well. However, after he eats he coughs so much that he spits up what he has ingested. Amy recommends that he use his Nebulizer before he eats to prevent this. She also reports that pt is having difficulty affording 3 Ensure drinks a day. Pt is interested in being prescribed something similar that can be essentially covered by his insurance. Please advise.

## 2018-10-05 NOTE — Telephone Encounter (Signed)
Can you assist patient with Ensure products, refer to note from home health.

## 2018-10-05 NOTE — Telephone Encounter (Signed)
Left general message with Amy, HIPPA compliant, and forwarded to Janci Minor RN Case Manager on CCM team to assist with Ensure.

## 2018-10-13 ENCOUNTER — Ambulatory Visit (INDEPENDENT_AMBULATORY_CARE_PROVIDER_SITE_OTHER): Payer: Medicaid Other | Admitting: Gastroenterology

## 2018-10-13 ENCOUNTER — Encounter: Payer: Self-pay | Admitting: Gastroenterology

## 2018-10-13 VITALS — BP 119/78 | HR 101 | Temp 97.7°F | Ht 67.0 in | Wt 120.4 lb

## 2018-10-13 DIAGNOSIS — R1310 Dysphagia, unspecified: Secondary | ICD-10-CM

## 2018-10-13 NOTE — Patient Instructions (Signed)
UGI Study: June 8th, 2020 at [facility]. Check in at the medical mall entrance, registration desk at 10:15 am, UGI at 10:30 am. Nothing to eat or drink 6 hrs prior to test.

## 2018-10-13 NOTE — Progress Notes (Addendum)
Austin Blevins [address]  [address]  [place], [place] [address]  Main: [phone]  Fax: [phone]   Gastroenterology Consultation  Referring Provider:     [name], NP Primary Care Physician:  [name], NP Reason for Consultation:     Dysphagia        HPI:   Chief complaint: Dysphagia  Austin Blevins is a 58 y.o. y/o male referred for consultation & management  by Dr. [name], NP.    As per patient's PCP, [name] Jolene's epic message to me:  "I have [facility] ST visit notes and wanted to pass some info on for this patient's visit tomorrow as nor sure they will be scanned in chart by then:   -ST evaluated due to in house therapy reporting he coughed up when he eats and drinks every other day on average   - ST did home evaluation he did not have coughing episode while they were there and all phases of swallow were normal at the time, they did recommend he use her nebulizer treatment prior to meals due to his severe COPD   - they mentioned GI consult for esophageal phase dysfunction, he has had weight loss 50 pounds over past 2 years (new patient to our office), but also has severe COPD. Last chest CT 03/2018. We are working on getting samples for Ensure for him   - ST reported oropharyngeal swallowing as normal"  As stated in the above note "ST" stands for speech therapy.  Therefore, patient has been evaluated by speech therapy as per the above note and oropharyngeal swallowing was normal.   Patient states he does not know why he had evaluation for swallowing as he has never had difficulty actually swallowing liquids or solids or pills.  He states he is able to eat hamburgers or steaks without any difficulty.  However, he specifically states that he has coughing spells due to his severe COPD and continued smoking and during these coughing spells he will have a lot of mucus output and sometimes food if he has just recently  eaten.  If the coughing spell occurs hours after eating he does not have any output of food with a coughing spell.  He only notices food with a coughing spell about once a week.  Patient specifically denies any dysphagia or odynophagia.    No prior EGD or colonoscopy.  No family history of GI malignancy.  He states his pulmonologist has told him that he cannot be put to sleep due to his severe COPD.  Past Medical History:  Diagnosis Date   Anxiety    COPD (chronic obstructive pulmonary disease) (HCC)    Depression    Hydrocephalus (HCC)     Past Surgical History:  Procedure Laterality Date   BRAIN SURGERY  2003   VA shunt for hydrocephalus   ventricular shunt      Prior to Admission medications   Medication Sig Start Date End Date Taking? Authorizing Provider  albuterol (PROVENTIL HFA;VENTOLIN HFA) 108 (90 Base) MCG/ACT inhaler Inhale 2 puffs into the lungs every 4 (four) hours as needed for wheezing or shortness of breath. 08/02/18   [name], Jolene T, NP  albuterol (PROVENTIL) (2.5 MG/3ML) 0.083% nebulizer solution USE ONE VIAL IN NEBULIZER EVERY SIX HOURS AS NEEDED FOR WHEEZING OR SHORTNESS OF BREATH 09/01/18   [name], Jolene T, NP  Fluticasone-Salmeterol (ADVAIR DISKUS) 250-50 MCG/DOSE AEPB Inhale 1 puff into the lungs 2 (two) times  daily. 10/03/18   Aura Dials T, NP  omeprazole (PRILOSEC) 20 MG capsule Take 1 capsule (20 mg total) by mouth daily. 08/02/18   [name], [name] T, NP  rOPINIRole (REQUIP) 0.5 MG tablet Take 1 tablet (0.5 MG) in morning and 3 tablets (1.5 MG) at bedtime for restless leg syndrome. 10/03/18   [name], [name] T, NP  sertraline (ZOLOFT) 25 MG tablet Take 1 tablet (25 mg total) by mouth daily. Patient not taking: Reported on 09/30/2018 09/27/18   Aura Dials T, NP  SPIRIVA HANDIHALER 18 MCG inhalation capsule INHALE CONTENTS OF 1 CAPSULE AS DIRECTEDONCE A DAY VIA HANDIHALER 09/30/18   [name], Jolene T, NP  Vitamin D, Ergocalciferol, (DRISDOL) 1.25 MG  (50000 UT) CAPS capsule Take 1 capsule (50,000 Units total) by mouth every 7 (seven) days. Patient not taking: Reported on 09/20/2018 07/05/18   Doles-Johnson, Teah, NP    Family History  Problem Relation Age of Onset   Diabetes Mellitus II Mother    CAD Father    Hypertension Father    Arthritis/Rheumatoid Sister    Hyperlipidemia Sister    Hypertension Sister    Depression Sister    Anxiety disorder Sister    Diabetes Sister    Heart disease Brother    Other Brother        DDD   Diabetes Sister    Diabetes Sister    Arthritis/Rheumatoid Brother    Other Brother        DDD     Social History   Tobacco Use   Smoking status: Current Every Day Smoker    Packs/day: 1.00    Types: Cigarettes    Last attempt to quit: 06/17/2016    Years since quitting: 2.3   Smokeless tobacco: Never Used  Substance Use Topics   Alcohol use: No   Drug use: No    Allergies as of 10/13/2018 - Review Complete 09/20/2018  Allergen Reaction Noted   Acetaminophen Other (See Comments) 07/19/2018   Percocet [oxycodone-acetaminophen]  04/08/2018    Review of Systems:    All systems reviewed and negative except where noted in HPI.   Physical Exam:   Vitals:   10/13/18 1315  BP: 119/78  Pulse: (!) 101  Temp: 97.7 F (36.5 C)  TempSrc: Oral  Weight: 120 lb 6.4 oz (54.6 kg)  Height: 5\' 7"  (1.702 m)   No LMP for male patient. Psych:  Alert and cooperative. Normal mood and affect. General:   Alert,  Well-developed, well-nourished, pleasant and cooperative in NAD Head:  Normocephalic and atraumatic. Eyes:  Sclera clear, no icterus.   Conjunctiva pink. Ears:  Normal auditory acuity. Nose:  No deformity, discharge, or lesions. Mouth:  No deformity or lesions,oropharynx pink & moist. Neck:  Supple; no masses or thyromegaly. Abdomen:  Normal bowel sounds.  No bruits.  Soft, non-tender and non-distended without masses, hepatosplenomegaly or hernias noted.  No guarding or  rebound tenderness.    Msk:  Symmetrical without gross deformities. Good, equal movement & strength bilaterally. Pulses:  Normal pulses noted. Extremities:  No clubbing or edema.  No cyanosis. Neurologic:  Alert and oriented x3;  grossly normal neurologically. Skin:  Intact without significant lesions or rashes. No jaundice. Lymph Nodes:  No significant cervical adenopathy. Psych:  Alert and cooperative. Normal mood and affect.   Labs: CBC    Component Value Date/Time   WBC 6.9 04/28/2018 1821   WBC 8.2 04/08/2018 0448   RBC 4.85 04/28/2018 1821   RBC 4.35 04/08/2018  0448   HGB 14.5 04/28/2018 1821   HCT 43.0 04/28/2018 1821   PLT 347 04/28/2018 1821   MCV 89 04/28/2018 1821   MCV 95 11/12/2011 2107   MCH 29.9 04/28/2018 1821   MCH 29.9 04/08/2018 0448   MCHC 33.7 04/28/2018 1821   MCHC 31.1 04/08/2018 0448   RDW 12.2 (L) 04/28/2018 1821   RDW 12.6 11/12/2011 2107   LYMPHSABS 1.2 02/23/2017 0106   LYMPHSABS 2.3 11/12/2011 2107   MONOABS 0.6 02/23/2017 0106   MONOABS 0.5 11/12/2011 2107   EOSABS 0.3 02/23/2017 0106   EOSABS 0.1 11/12/2011 2107   BASOSABS 0.1 02/23/2017 0106   BASOSABS 0 04/16/2014 1125   CMP     Component Value Date/Time   NA 141 04/28/2018 1821   NA 141 11/12/2011 2107   K 5.1 04/28/2018 1821   K 4.0 11/12/2011 2107   CL 101 04/28/2018 1821   CL 106 11/12/2011 2107   CO2 27 04/28/2018 1821   CO2 29 11/12/2011 2107   GLUCOSE 127 (H) 04/28/2018 1821   GLUCOSE 220 (H) 04/08/2018 0448   GLUCOSE 82 11/12/2011 2107   BUN 21 04/28/2018 1821   BUN 11 11/12/2011 2107   CREATININE 1.08 04/28/2018 1821   CREATININE 0.89 11/12/2011 2107   CALCIUM 10.1 04/28/2018 1821   CALCIUM 9.1 11/12/2011 2107   PROT 6.6 12/14/2017 1832   ALBUMIN 3.4 (L) 12/14/2017 1832   AST 12 12/14/2017 1832   ALT 12 12/14/2017 1832   ALKPHOS 98 12/14/2017 1832   BILITOT 0.3 12/14/2017 1832   GFRNONAA 76 04/28/2018 1821   GFRNONAA >60 11/12/2011 2107   GFRAA 88 04/28/2018  1821   GFRAA >60 11/12/2011 2107    Imaging Studies: No results found.  Assessment and Plan:   Maxie BetterBarry K Blevins is a 58 y.o. y/o male has been referred for evaluation of dysphagia  Patient specifically denies any dysphagia or odynophagia  Patient does not know why he has received a swallow evaluation as he specifically denies any problems with swallowing.  Is able to consume all liquids without difficulty, eat steak without any difficulty.  He attributes his symptoms of bringing up food to his coughing spells.  He does not bring up any food outside of the coughing spells.  Swallowing itself does not trigger the coughing spell.  And if he has eaten 2 to 3 hours before his coughing spell he does not see any food come back up, and only sees mucus.  Therefore, his symptoms are not consistent with true dysphagia or odynophagia.  Instead he has severe COPD and continues to smoke and reports severe coughing spells due to this, which can lead to regurgitation of small amount of food if he has just recently eaten.  He states his pulmonologist told him he cannot be put to sleep due to his severe COPD.  He is not interested in EGD and colonoscopy at this time.  Risks of underlying malignancy if colorectal cancer screening is not done was discussed and he verbalized understanding.  However, he is agreeable to following up with PCP for alternative methods of CRC screening.  He states "COPD will take me from this earth before colon cancer".  We discussed upper GI study to rule out any esophageal lesions and this would be a safer study since it does not require sedation.  Patient is agreeable to this.  Will order.  Follow-up with pulmonary and PCP closely.  treatment for COPD and coughing as per PCP and  pulmonary   Dr Austin Blevins  Speech recognition software was used to dictate the above note.

## 2018-10-17 ENCOUNTER — Telehealth: Payer: Self-pay

## 2018-10-17 ENCOUNTER — Ambulatory Visit: Payer: Self-pay | Admitting: Licensed Clinical Social Worker

## 2018-10-17 NOTE — Chronic Care Management (AMB) (Signed)
  Care Management Note   Austin Blevins is a 58 y.o. year old male who is a primary care patient of [name], [name], NP. The CM team was consulted for assistance with chronic disease management and care coordination.   I reached out to [name] by phone today. LCSW completed CCM outreach attempt today but was unable to reach patient successfully. L;CSW was unable to leave a voice message as phone line continuously rang. LCSW rescheduled CCM SW appointment as well.  Review of patient status, including review of consultants reports, relevant laboratory and other test results, and collaboration with appropriate care team members and the patient's provider was performed as part of comprehensive patient evaluation and provision of chronic care management services.   Follow Up Plan: The care management team will reach out to the patient again next month.  Dickie La, BSW, MSW, LCSW [facility] [place]  [facility] [email or web address] Phone: [phone]

## 2018-10-19 ENCOUNTER — Telehealth: Payer: Self-pay

## 2018-10-19 ENCOUNTER — Ambulatory Visit: Payer: Self-pay | Admitting: *Deleted

## 2018-10-19 DIAGNOSIS — E43 Unspecified severe protein-calorie malnutrition: Secondary | ICD-10-CM

## 2018-10-19 NOTE — Chronic Care Management (AMB) (Signed)
  Chronic Care Management   Outreach Note  10/19/2018 Name: Austin Blevins MRN: [id number] DOB: 1960/12/30  Referred by: [name], NP Reason for referral : Chronic Care Management (COPD)   An unsuccessful telephone outreach was attempted today. The patient was referred to the case management team by for assistance with chronic care management and care coordination.   Follow Up Plan: A HIPPA compliant phone message was left for the patient providing contact information and requesting a return call.  The care management team will reach out to the patient again over the next 14 days.   Ma Rings Nailea Whitehorn RN, BSN Nurse Case Education officer, community Family [facility]  [phone]) Business Mobile

## 2018-10-21 ENCOUNTER — Other Ambulatory Visit: Payer: Self-pay

## 2018-10-21 ENCOUNTER — Ambulatory Visit: Payer: Self-pay | Admitting: *Deleted

## 2018-10-21 DIAGNOSIS — J439 Emphysema, unspecified: Secondary | ICD-10-CM

## 2018-10-21 DIAGNOSIS — E43 Unspecified severe protein-calorie malnutrition: Secondary | ICD-10-CM

## 2018-10-21 NOTE — Patient Instructions (Signed)
Thank you allowing the Chronic Care Management Team to be a part of your care! It was a pleasure speaking with you today!  CCM (Chronic Care Management) Team   Carmeron Heady RN, BSN Nurse Care Coordinator  [phone]  Catie Feliz Beam PharmD  Clinical Pharmacist  [phone]  Dickie La LCSW Clinical Social Worker [phone]   The care management team will reach out to the patient again over the next 30 days.  The patient has been provided with contact information for the care management team and has been advised to call with any health related questions or concerns.

## 2018-10-21 NOTE — Chronic Care Management (AMB) (Signed)
  Chronic Care Management   Follow Up Note   10/21/2018 Name: THAILAND SALZBERG MRN: [id number] DOB: [birth year]  Referred by: [name], NP Reason for referral : Chronic Care Management (COPD/ENSURE)   LIRAN NERISON is a 58 y.o. year old male who is a primary care patient of [name], [name], NP. The CCM team was consulted for assistance with chronic disease management and care coordination needs.    Review of patient status, including review of consultants reports, relevant laboratory and other test results, and collaboration with appropriate care team members and the patient's provider was performed as part of comprehensive patient evaluation and provision of chronic care management services.    Goals Addressed            This Visit's Progress   . copd (pt-stated)       Current Barriers:  [name] Kitchen Knowledge deficits related to basic COPD self care/management . Knowledge deficit related to importance of energy conservation  . Financial Barriers   Case Manager Clinical Goal(s):  Over the next 90 days, patient will verbalize basic understanding of COPD disease process and self care activities   Interventions:   Provided patient with basic written and verbal COPD education on self care/management/and exacerbation prevention   Discussed Pulmonary Rehab and offered to assist with referral placement   Obtained Ensure samples for patient to pick up at MD office  Discussed upcoming swallowing test   Reviewed how to add calories to patient's diet  Educated on eating small frequent meals   Patient Self Care Activities:  Takes medications as prescribed including inhalers  Initial goal documentation     . Need Home Health PT (pt-stated)       Current Barriers:  [name] Kitchen Knowledge Deficits related to obtaining home health PT  Nurse Case Manager Clinical Goal(s):  [name] Kitchen Over the next 30 days, patient will work with Home Health PT  to address needs related to Left shoulder Pain   Interventions:  . Provided education to patient re: Home Health PT process . Collaborated with PCP regarding HHPT order . Discussed plans with patient for ongoing care management follow up and provided patient with direct contact information for care management team  . Contacted [facility] to initiate HHPT for patient . Patient continues with HHPT, has not seen much improvement to shoulder . Discussed pulmonary rehab [facility] and patient interested if he is able to qualify. . Patient states he is seeing improvement in his shoulder    Patient Self Care Activities:  . Attends all scheduled provider appointments . Performs ADL's independently  Please see past updates related to this goal by clicking on the "Past Updates" button in the selected goal           The care management team will reach out to the patient again over the next 30 days.  The patient has been provided with contact information for the care management team and has been advised to call with any health related questions or concerns.    Ma Rings Stefany Starace RN, BSN Nurse Case Education officer, community Family Practice/[facility]  [phone]) Business Mobile

## 2018-10-24 ENCOUNTER — Encounter: Payer: Self-pay | Admitting: Nurse Practitioner

## 2018-10-24 ENCOUNTER — Ambulatory Visit (INDEPENDENT_AMBULATORY_CARE_PROVIDER_SITE_OTHER): Payer: Medicaid Other | Admitting: Nurse Practitioner

## 2018-10-24 ENCOUNTER — Other Ambulatory Visit: Payer: Self-pay | Admitting: Gastroenterology

## 2018-10-24 ENCOUNTER — Ambulatory Visit
Admission: RE | Admit: 2018-10-24 | Discharge: 2018-10-24 | Disposition: A | Discharge status: Home / Self Care | Payer: Medicaid Other | Source: Ambulatory Visit | Attending: Gastroenterology | Admitting: Gastroenterology

## 2018-10-24 ENCOUNTER — Other Ambulatory Visit: Payer: Self-pay

## 2018-10-24 VITALS — BP 123/81 | HR 84 | Temp 98.1°F | Ht 67.0 in | Wt 120.0 lb

## 2018-10-24 DIAGNOSIS — R7303 Prediabetes: Secondary | ICD-10-CM | POA: Diagnosis not present

## 2018-10-24 DIAGNOSIS — R1310 Dysphagia, unspecified: Secondary | ICD-10-CM

## 2018-10-24 DIAGNOSIS — J440 Chronic obstructive pulmonary disease with acute lower respiratory infection: Secondary | ICD-10-CM

## 2018-10-24 DIAGNOSIS — F17219 Nicotine dependence, cigarettes, with unspecified nicotine-induced disorders: Secondary | ICD-10-CM

## 2018-10-24 DIAGNOSIS — F331 Major depressive disorder, recurrent, moderate: Secondary | ICD-10-CM

## 2018-10-24 DIAGNOSIS — K219 Gastro-esophageal reflux disease without esophagitis: Secondary | ICD-10-CM

## 2018-10-24 DIAGNOSIS — J449 Chronic obstructive pulmonary disease, unspecified: Secondary | ICD-10-CM

## 2018-10-24 DIAGNOSIS — J209 Acute bronchitis, unspecified: Secondary | ICD-10-CM | POA: Diagnosis not present

## 2018-10-24 DIAGNOSIS — G2581 Restless legs syndrome: Secondary | ICD-10-CM | POA: Diagnosis not present

## 2018-10-24 DIAGNOSIS — Z66 Do not resuscitate: Secondary | ICD-10-CM

## 2018-10-24 MED ORDER — ALBUTEROL SULFATE (2.5 MG/3ML) 0.083% IN NEBU: INHALATION_SOLUTION | RESPIRATORY_TRACT | 4 refills | Status: DC

## 2018-10-24 MED ORDER — ALBUTEROL SULFATE (2.5 MG/3ML) 0.083% IN NEBU
2.5000 mg | INHALATION_SOLUTION | Freq: Once | RESPIRATORY_TRACT | Status: AC
  Administered 2018-10-24: 2.5 mg via RESPIRATORY_TRACT

## 2018-10-24 NOTE — Patient Instructions (Signed)
COPD and Physical Activity  Chronic obstructive pulmonary disease (COPD) is a long-term (chronic) condition that affects the lungs. COPD is a general term that can be used to describe many different lung problems that cause lung swelling (inflammation) and limit airflow, including chronic bronchitis and emphysema.  The main symptom of COPD is shortness of breath, which makes it harder to do even simple tasks. This can also make it harder to exercise and be active. Talk with your health care provider about treatments to help you breathe better and actions you can take to prevent breathing problems during physical activity.  What are the benefits of exercising with COPD?  Exercising regularly is an important part of a healthy lifestyle. You can still exercise and do physical activities even though you have COPD. Exercise and physical activity improve your shortness of breath by increasing blood flow (circulation). This causes your heart to pump more oxygen through your body. Moderate exercise can improve your:  · Oxygen use.  · Energy level.  · Shortness of breath.  · Strength in your breathing muscles.  · Heart health.  · Sleep.  · Self-esteem and feelings of self-worth.  · Depression, stress, and anxiety levels.  Exercise can benefit everyone with COPD. The severity of your disease may affect how hard you can exercise, especially at first, but everyone can benefit. Talk with your health care provider about how much exercise is safe for you, and which activities and exercises are safe for you.  What actions can I take to prevent breathing problems during physical activity?  · Sign up for a pulmonary rehabilitation program. This type of program may include:  ? Education about lung diseases.  ? Exercise classes that teach you how to exercise and be more active while improving your breathing. This usually involves:  § Exercise using your lower extremities, such as a stationary bicycle.  § About 30 minutes of exercise, 2  to 5 times per week, for 6 to 12 weeks  § Strength training, such as push ups or leg lifts.  ? Nutrition education.  ? Group classes in which you can talk with others who also have COPD and learn ways to manage stress.  · If you use an oxygen tank, you should use it while you exercise. Work with your health care provider to adjust your oxygen for your physical activity. Your resting flow rate is different from your flow rate during physical activity.  · While you are exercising:  ? Take slow breaths.  ? Pace yourself and do not try to go too fast.  ? Purse your lips while breathing out. Pursing your lips is similar to a kissing or whistling position.  ? If doing exercise that uses a quick burst of effort, such as weight lifting:  § Breathe in before starting the exercise.  § Breathe out during the hardest part of the exercise (such as raising the weights).  Where to find support  You can find support for exercising with COPD from:  · Your health care provider.  · A pulmonary rehabilitation program.  · Your local health department or community health programs.  · Support groups, online or in-person. Your health care provider may be able to recommend support groups.  Where to find more information  You can find more information about exercising with COPD from:  · American Lung Association: lung.org.  · COPD Foundation: copdfoundation.org.  Contact a health care provider if:  · Your symptoms get worse.  · You   have chest pain.  · You have nausea.  · You have a fever.  · You have trouble talking or catching your breath.  · You want to start a new exercise program or a new activity.  Summary  · COPD is a general term that can be used to describe many different lung problems that cause lung swelling (inflammation) and limit airflow. This includes chronic bronchitis and emphysema.  · Exercise and physical activity improve your shortness of breath by increasing blood flow (circulation). This causes your heart to provide more  oxygen to your body.  · Contact your health care provider before starting any exercise program or new activity. Ask your health care provider what exercises and activities are safe for you.  This information is not intended to replace advice given to you by your health care provider. Make sure you discuss any questions you have with your health care provider.  Document Released: 05/27/2017 Document Revised: 05/27/2017 Document Reviewed: 05/27/2017  Elsevier Interactive Patient Education © 2019 [employer]vier Inc.

## 2018-10-24 NOTE — Assessment & Plan Note (Signed)
I have recommended complete cessation of tobacco use. I have discussed various options available for assistance with tobacco cessation including over the counter methods (Nicotine gum, patch and lozenges). We also discussed prescription options (Chantix, Nicotine Inhaler / Nasal Spray). The patient is not interested in pursuing any prescription tobacco cessation options at this time. Palliative referral.

## 2018-10-24 NOTE — Assessment & Plan Note (Addendum)
A1C today and adjust POC as indicated.

## 2018-10-24 NOTE — Progress Notes (Signed)
BP 123/81   Pulse 84   Temp 98.1 F (36.7 C) (Oral)   Ht 5\' 7"  (1.702 m)   Wt 120 lb (54.4 kg)   SpO2 92%   BMI 18.79 kg/m    Subjective:    Patient ID: Austin Blevins, male    DOB: Nov 18, 1960, 58 y.o.   MRN: [id number]  HPI: Austin Blevins is a 58 y.o. male  Chief Complaint  Patient presents with  . Depression    f/u  . COPD  . RLS   COPD Continues on Advair, Spiriva, and Albuterol.  Pre spirometry today with FEV1 31%, FVC 45%, FEV1/FVC 69%.  Post FEV1 30%, FVC 42%, and FEV1/FVC 71%.  He is O2 dependent, 2L.  Uses this "pretty much all the time".  Continues to smoke 1 PPD.  We had at length discussion on goals of care with his severe COPD.  He reports his sister is his POA and knows his wishes.  He wishes to be DNR, "I only want to diet once", and no artificial life support.  States wishes to "die peacefully, either in sleep or with medicine to make me comfortable".  Recommended he bring in POA documents to next visit so we can scan in chart.  Discussed at length with him palliative vs hospice care, which he may benefit from at this time.  He was very interested in referral to this type of program.   COPD status: stable Satisfied with current treatment?: yes Oxygen use: yes Dyspnea frequency: present Cough frequency: present chronic Rescue inhaler frequency:  5-6 times a day Limitation of activity: yes Productive cough: chronic Last Spirometry: 02/06/15 FEV1 41% Pneumovax: Up to Date Influenza: Up to Date   DEPRESSION Started on Sertraline 25 MG last visit for mood.  He reports "100% better" mood with Sertraline and states "I have no more depression or anxiety" Mood status: stable Satisfied with current treatment?: yes Symptom severity: mild  Duration of current treatment : months Side effects: no Medication compliance: good compliance Psychotherapy/counseling: none Previous psychiatric medications: Sertraline Depressed mood: no Anxious mood: no Anhedonia: no  Significant weight loss or gain: due to COPD Insomnia: yes hard to stay asleep Fatigue: no Feelings of worthlessness or guilt: no Impaired concentration/indecisiveness: no Suicidal ideations: no Hopelessness: no Crying spells: no Depression screen [facility] 2/[facility] 09/20/2018 08/09/2018 07/19/2018 06/24/2017  Decreased Interest 3 1 1 3   Down, Depressed, Hopeless 3 1 1 3   PHQ - 2 Score 6 2 2 6   Altered sleeping 1 2 3 1   Tired, decreased energy 3 3 3 3   Change in appetite 1 3 3 3   Feeling bad or failure about yourself  2 2 2 1   Trouble concentrating 1 0 0 1  Moving slowly or fidgety/restless 0 0 3 2  Suicidal thoughts 0 0 0 0  PHQ-9 Score 14 12 16 17   Difficult doing work/chores - Somewhat difficult Somewhat difficult -    RESTLESS LEGS Currently taking Requip 0.5 MG in morning and 1.5 MG at bedtime. Duration: chronic LE numbness: no Decreased sensation: no Weakness: no Insomnia: yes Daytime somnolence: no Fatigue: no Alleviating factors:  Aggravating factors:  Status: better Treatments attempted:   PREDIABETES: Last A1C in March 2019 = 6.1%.  No current medications.  Denies polyuria, polydipsia, or polyphagia.    Relevant past medical, surgical, family and social history reviewed and updated as indicated. Interim medical history since our last visit reviewed. Allergies and medications reviewed and updated.  Review of  Systems  Constitutional: Negative for activity change, diaphoresis, fatigue and fever.  Respiratory: Positive for cough, shortness of breath (occasionally only per baseline) and wheezing. Negative for chest tightness.   Cardiovascular: Negative for chest pain, palpitations and leg swelling.  Gastrointestinal: Negative for abdominal distention, abdominal pain, constipation, diarrhea, nausea and vomiting.  Endocrine: Negative for cold intolerance, heat intolerance, polydipsia, polyphagia and polyuria.  Musculoskeletal: Negative.   Skin: Negative.   Neurological: Negative  for dizziness, syncope, weakness, light-headedness, numbness and headaches.  Psychiatric/Behavioral: Negative.     Per HPI unless specifically indicated above     Objective:    BP 123/81   Pulse 84   Temp 98.1 F (36.7 C) (Oral)   Ht 5\' 7"  (1.702 m)   Wt 120 lb (54.4 kg)   SpO2 92%   BMI 18.79 kg/m   Wt Readings from Last 3 Encounters:  10/24/18 120 lb (54.4 kg)  10/13/18 120 lb 6.4 oz (54.6 kg)  09/20/18 122 lb (55.3 kg)    Physical Exam Vitals signs and nursing note reviewed.  Constitutional:      Appearance: He is well-developed and underweight.     Comments: Frail looking and pleasant Caucasian gentleman.    HENT:     Head: Normocephalic and atraumatic.     Right Ear: Hearing normal. No drainage.     Left Ear: Hearing normal. No drainage.     Mouth/Throat:     Pharynx: Uvula midline.  Eyes:     General: Lids are normal.        Right eye: No discharge.        Left eye: No discharge.     Conjunctiva/sclera: Conjunctivae normal.     Pupils: Pupils are equal, round, and reactive to light.  Neck:     Musculoskeletal: Normal range of motion and neck supple.     Thyroid: No thyromegaly.     Vascular: No carotid bruit or JVD.     Trachea: Trachea normal.  Cardiovascular:     Rate and Rhythm: Normal rate and regular rhythm.     Heart sounds: Normal heart sounds, S1 normal and S2 normal. No murmur. No gallop.   Pulmonary:     Effort: Pulmonary effort is normal. No accessory muscle usage or respiratory distress.     Breath sounds: Decreased breath sounds present.     Comments: Sitting in Tripod position at baseline, O2 in place at 2L [place].  Clubbing of fingers bilateral hands.  Diminished/distant breath sounds throughout lung fields with intermittent expiratory wheezes and cough present.  No SOB with talking. Abdominal:     General: Bowel sounds are normal.     Palpations: Abdomen is soft. There is no hepatomegaly or splenomegaly.  Musculoskeletal: Normal range of  motion.     Right lower leg: No edema.     Left lower leg: No edema.  Skin:    General: Skin is warm and dry.     Capillary Refill: Capillary refill takes less than 2 seconds.     Findings: No rash.  Neurological:     Mental Status: He is alert and oriented to person, place, and time.     Deep Tendon Reflexes: Reflexes are normal and symmetric.  Psychiatric:        Mood and Affect: Mood normal.        Behavior: Behavior normal.        Thought Content: Thought content normal.        Judgment: Judgment normal.  Results for orders placed or performed in visit on 07/19/18  HM HEPATITIS C SCREENING LAB  Result Value Ref Range   HM Hepatitis Screen Negative - Patient Reported       Assessment & Plan:   Problem List Items Addressed This Visit      Respiratory   Stage 3 severe COPD by GOLD classification (HCC) - Primary    Chronic, ongoing, severe with O2 dependence.  Continue current medication and collaboration with CCM team. CBC today.  Discussed with CCM team possible palliative or hospice referral as patient interested in programs, they will work on this.      Relevant Medications   albuterol (PROVENTIL) (2.5 MG/3ML) 0.083% nebulizer solution 2.5 mg (Completed)   albuterol (PROVENTIL) (2.5 MG/3ML) 0.083% nebulizer solution   Other Relevant Orders   Spirometry with graph (Completed)   CBC with Differential/Platelet   Comprehensive metabolic panel     Digestive   Gastroesophageal reflux disease   Relevant Orders   Magnesium     Nervous and Auditory   Nicotine dependence, cigarettes, w unsp disorders    I have recommended complete cessation of tobacco use. I have discussed various options available for assistance with tobacco cessation including over the counter methods (Nicotine gum, patch and lozenges). We also discussed prescription options (Chantix, Nicotine Inhaler / Nasal Spray). The patient is not interested in pursuing any prescription tobacco cessation options at  this time. Palliative referral.        Other   Depression, major, recurrent, moderate (HCC)    Chronic, ongoing.  Continue current medication regimen as improvement in mood reported, Sertraline 25 MG daily.      Prediabetes    A1C today and adjust POC as indicated.      Relevant Orders   HgB A1c   Restless legs syndrome (RLS)    Chronic, ongoing.  Continue current medication regimen, which is benefiting patient (0.5 MG in morning and 1.5 MG at night).        DNR (do not resuscitate)    Advanced care discussion had, patient wishes to be a DNR.  Will bring in POA documents next visit.  Will work on palliative or hospice referral based on qualifications.         Time: 25 minutes, >50% spent discussing advanced care planning and disease processes Follow up plan: Return in about 2 months (around 12/24/2018) for COPD.

## 2018-10-24 NOTE — Assessment & Plan Note (Signed)
Chronic, ongoing.  Continue current medication regimen, which is benefiting patient (0.5 MG in morning and 1.5 MG at night).

## 2018-10-24 NOTE — Assessment & Plan Note (Signed)
Chronic, ongoing, severe with O2 dependence.  Continue current medication and collaboration with CCM team. CBC today.  Discussed with CCM team possible palliative or hospice referral as patient interested in programs, they will work on this.

## 2018-10-24 NOTE — Assessment & Plan Note (Signed)
Chronic, ongoing.  Continue current medication regimen as improvement in mood reported, Sertraline 25 MG daily.

## 2018-10-24 NOTE — Assessment & Plan Note (Signed)
Advanced care discussion had, patient wishes to be a DNR.  Will bring in POA documents next visit.  Will work on palliative or hospice referral based on qualifications.

## 2018-10-25 ENCOUNTER — Ambulatory Visit: Payer: Self-pay | Admitting: *Deleted

## 2018-10-25 ENCOUNTER — Telehealth: Payer: Self-pay

## 2018-10-25 DIAGNOSIS — J449 Chronic obstructive pulmonary disease, unspecified: Secondary | ICD-10-CM

## 2018-10-25 DIAGNOSIS — E43 Unspecified severe protein-calorie malnutrition: Secondary | ICD-10-CM

## 2018-10-25 LAB — COMPREHENSIVE METABOLIC PANEL
ALT: 11 IU/L (ref 0–44)
AST: 14 IU/L (ref 0–40)
Albumin/Globulin Ratio: 1.3 (ref 1.2–2.2)
Albumin: 4 g/dL (ref 3.8–4.9)
Alkaline Phosphatase: 102 IU/L (ref 39–117)
BUN/Creatinine Ratio: 15 (ref 9–20)
BUN: 16 mg/dL (ref 6–24)
Bilirubin Total: 0.3 mg/dL (ref 0.0–1.2)
CO2: 28 mmol/L (ref 20–29)
Calcium: 9.6 mg/dL (ref 8.7–10.2)
Chloride: 102 mmol/L (ref 96–106)
Creatinine, Ser: 1.09 mg/dL (ref 0.76–1.27)
GFR calc Af Amer: 87 mL/min/{1.73_m2} (ref >59)
GFR calc non Af Amer: 75 mL/min/{1.73_m2} (ref >59)
Globulin, Total: 3 g/dL (ref 1.5–4.5)
Glucose: 107 mg/dL — ABNORMAL HIGH (ref 65–99)
Potassium: 4.3 mmol/L (ref 3.5–5.2)
Sodium: 143 mmol/L (ref 134–144)
Total Protein: 7 g/dL (ref 6.0–8.5)

## 2018-10-25 LAB — CBC WITH DIFFERENTIAL/PLATELET
Basophils Absolute: 0.1 10*3/uL (ref 0.0–0.2)
Basos: 1 %
EOS (ABSOLUTE): 0.1 10*3/uL (ref 0.0–0.4)
Eos: 1 %
Hematocrit: 43.2 % (ref 37.5–51.0)
Hemoglobin: 15 g/dL (ref 13.0–17.7)
Immature Grans (Abs): 0 10*3/uL (ref 0.0–0.1)
Immature Granulocytes: 0 %
Lymphocytes Absolute: 0.9 10*3/uL (ref 0.7–3.1)
Lymphs: 14 %
MCH: 30.5 pg (ref 26.6–33.0)
MCHC: 34.7 g/dL (ref 31.5–35.7)
MCV: 88 fL (ref 79–97)
Monocytes Absolute: 0.4 10*3/uL (ref 0.1–0.9)
Monocytes: 7 %
Neutrophils Absolute: 5.2 10*3/uL (ref 1.4–7.0)
Neutrophils: 77 %
Platelets: 299 10*3/uL (ref 150–450)
RBC: 4.91 x10E6/uL (ref 4.14–5.80)
RDW: 11.8 % (ref 11.6–15.4)
WBC: 6.7 10*3/uL (ref 3.4–10.8)

## 2018-10-25 LAB — HEMOGLOBIN A1C
Est. average glucose Bld gHb Est-mCnc: 114 mg/dL
Hgb A1c MFr Bld: 5.6 % (ref 4.8–5.6)

## 2018-10-25 LAB — MAGNESIUM: Magnesium: 2.1 mg/dL (ref 1.6–2.3)

## 2018-10-25 NOTE — Chronic Care Management (AMB) (Signed)
Chronic Care Management   Follow Up Note   10/25/2018 Name: Austin Blevins MRN: [id number] DOB: December 02, 1960  Referred by: Marjie Skiffannady, Jolene T, NP Reason for referral : Chronic Care Management (COPD) and Care Coordination (Palliative)   Austin Blevins is a 58 y.o. year old male who is a primary care patient of [name], Dorie RankJolene T, NP. The CCM team was consulted for assistance with chronic disease management and care coordination needs.    Review of patient status, including review of consultants reports, relevant laboratory and other test results, and collaboration with appropriate care team members and the patient's provider was performed as part of comprehensive patient evaluation and provision of chronic care management services.    Goals Addressed            This Visit's Progress   . copd (pt-stated)       Current Barriers:  [name] Kitchen. Knowledge deficits related to basic COPD self care/management . Knowledge deficit related to importance of energy conservation  . Financial Barriers   Case Manager Clinical Goal(s):  Over the next 90 days, patient will verbalize basic understanding of COPD disease process and self care activities   Interventions:   Provided patient with basic written and verbal COPD education on self care/management/and exacerbation prevention   Discussed Pulmonary Rehab and offered to assist with referral placement   Obtained Ensure samples for patient to pick up at MD office  Discussed upcoming swallowing test   Reviewed how to add calories to patient's diet  Educated on eating small frequent meals   Talked with patient about Hospice vs Palliative services: Patient prefers Palliative  Discussed the enrollment in a Pulmonary Rehab program: Patient prefers in home vs outpatient  Call made to Palliative Care services to place the referral  Call placed to [facility] to place the Pulmonary Rehab referral and the Rep requested to call back once patient has been  seen by Palliative. Will plan to do this.   Patient Self Care Activities:  Takes medications as prescribed including inhalers  Please see past updates related to this goal by clicking on the "Past Updates" button in the selected goal      . COMPLETED: Need Home Health PT (pt-stated)       Current Barriers:  [name] Kitchen. Knowledge Deficits related to obtaining home health PT  Nurse Case Manager Clinical Goal(s):  [name] Kitchen. Over the next 30 days, patient will work with Home Health PT  to address needs related to Left shoulder Pain  Interventions:  . Provided education to patient re: Home Health PT process . Collaborated with PCP regarding HHPT order . Discussed plans with patient for ongoing care management follow up and provided patient with direct contact information for care management team  . Contacted [facility] to initiate HHPT for patient . Patient continues with HHPT, has not seen much improvement to shoulder . [facility] and patient interested if he is able to qualify. . Patient states he is seeing improvement in his shoulder     Patient Self Care Activities:  . Attends all scheduled provider appointments . Performs ADL's independently  Please see past updates related to this goal by clicking on the "Past Updates" button in the selected goal           The care management team will reach out to the patient again over the next 14 days.  The patient has been provided with contact information for the care management team and has  been advised to call with any health related questions or concerns.    Merlene Morse Eudell Mcphee RN, BSN Nurse Case Editor, [facility]  [phone]) Business Mobile

## 2018-10-25 NOTE — Patient Instructions (Signed)
Thank you allowing the Chronic Care Management Team to be a part of your care! It was a pleasure speaking with you today!  CCM (Chronic Care Management) Team   Madison Albea RN, BSN Nurse Care Coordinator  [phone]  [facility] PharmD  Clinical Pharmacist  [phone]  Eula Fried LCSW Clinical Social Worker [phone]  Goals Addressed            This Visit's Progress   . copd (pt-stated)       Current Barriers:  [name] Kitchen Knowledge deficits related to basic COPD self care/management . Knowledge deficit related to importance of energy conservation  . Financial Barriers   Case Manager Clinical Goal(s):  Over the next 90 days, patient will verbalize basic understanding of COPD disease process and self care activities   Interventions:   Provided patient with basic written and verbal COPD education on self care/management/and exacerbation prevention   Discussed Pulmonary Rehab and offered to assist with referral placement   Obtained Ensure samples for patient to pick up at MD office  Discussed upcoming swallowing test   Reviewed how to add calories to patient's diet  Educated on eating small frequent meals   Talked with patient about Hospice vs Palliative services: Patient prefers Palliative  Discussed the enrollment in a Pulmonary Rehab program: Patient prefers in home vs outpatient  Call made to Palliative Care services to place the referral  Call placed to [facility] to place the Pulmonary Rehab referral and the Rep requested to call back once patient has been seen by Palliative. Will plan to do this.   Patient Self Care Activities:  Takes medications as prescribed including inhalers  Please see past updates related to this goal by clicking on the "Past Updates" button in the selected goal      . COMPLETED: Need [facility] (pt-stated)       Current Barriers:  [name] Kitchen Knowledge Deficits related to obtaining home health PT  Nurse Case Manager  Clinical Goal(s):  [name] Kitchen Over the next 30 days, patient will work with [facility]  to address needs related to Left shoulder Pain  Interventions:  . Provided education to patient re: Home Health PT process . Collaborated with PCP regarding HHPT order . Discussed plans with patient for ongoing care management follow up and provided patient with direct contact information for care management team  . [place] to initiate HHPT for patient . Patient continues with HHPT, has not seen much improvement to shoulder . Discussed pulmonary rehab [facility] and patient interested if he is able to qualify. . Patient states he is seeing improvement in his shoulder     Patient Self Care Activities:  . Attends all scheduled provider appointments . Performs ADL's independently  Please see past updates related to this goal by clicking on the "Past Updates" button in the selected goal          The patient verbalized understanding of instructions provided today and declined a print copy of patient instruction materials.   The care management team will reach out to the patient again over the next 14 days.  The patient has been provided with contact information for the care management team and has been advised to call with any health related questions or concerns.

## 2018-10-25 NOTE — Telephone Encounter (Signed)
Pt notified of results and contact office if needed.

## 2018-10-25 NOTE — Telephone Encounter (Signed)
-----   Message from Virgel Manifold, MD sent at 10/24/2018  4:45 PM EDT ----- Jackelyn Poling please let patient know, his study did not show any strictures or narrowing. It suggested possible reflux. I see his PCP prescribed Omeprazole which would help with this. If he has heartburn despite the dose he can let us know and we can consider increasing the dose.

## 2018-10-26 ENCOUNTER — Telehealth: Payer: Self-pay | Admitting: Student

## 2018-10-26 NOTE — Telephone Encounter (Signed)
Rec'd call from patient's sister [name] stating that patient has MD appointment on 10/27/18 and wanted to reschedule.  Palliative Consult was rescheduled for 10/31/18 @ 3:30 PM

## 2018-10-26 NOTE — Telephone Encounter (Signed)
Talked with patient and patient's sister [name] [facility]) and have scheduled a Telephone Palliative consult for 10/27/18 @ 3 PM.

## 2018-10-27 ENCOUNTER — Other Ambulatory Visit: Payer: Self-pay | Admitting: Student

## 2018-10-28 ENCOUNTER — Telehealth: Payer: Self-pay

## 2018-10-31 ENCOUNTER — Other Ambulatory Visit: Payer: Self-pay | Admitting: Nurse Practitioner

## 2018-10-31 ENCOUNTER — Other Ambulatory Visit: Payer: Medicaid Other | Admitting: Student

## 2018-10-31 ENCOUNTER — Other Ambulatory Visit: Payer: Self-pay

## 2018-10-31 DIAGNOSIS — Z515 Encounter for palliative care: Secondary | ICD-10-CM

## 2018-10-31 NOTE — Progress Notes (Addendum)
[facility] Consult Note Telephone: [phone]  Fax: [phone]  PATIENT NAME: Austin Blevins DOB: Mar 20, 1961 MRN: [id number]  PRIMARY CARE PROVIDER:   [name], NP  REFERRING PROVIDER:  [name], NP [address] [place],  [place] [address]  RESPONSIBLE PARTY: Self.    ASSESSMENT: Due to the COVID-19 crisis, this visit was done via telemedicine and it was initiated and consent by this patient and or family. Austin Blevins was unable to connect via zoom, therefore visit was conducted via telephone. NP explained role of Palliative Medicine. We discussed goals of care. Austin Blevins states he does not want to get any worse and to stay out of the hospital. We discussed smoking cessation, disease processes. Austin Blevins feels he has enough assistance in the home at present. We discussed symptom management. We discussed advanced care planning; he states he has a Living Will. He states he is a DNR. We briefly discussed MOST form; he is open to having MOST form filled out on next visit. Austin Blevins was given opportunity to ask questions. We also discussed Palliative vs. Hospice. NP called CM Janci after visit; voicemail was full. Will attempt to call her back to discuss patient.     RECOMMENDATIONS and PLAN:  1. Code status: DNR. Will fill out MOST form on next visit. 2. Medical goals of therapy: limit hospitalizations. Palliative Medicine will monitor for changes and declines; will make recommendations as needed. Follow up with PCP and Pulmonology as scheduled.  3. Symptom management: dyspnea-continue inhalers, nebulizer, prednisone as ordered; continue oxygen via nasal canula at 2 liters per minute. Pain-recommendation for mild pain ibuprofen 400mg  TID prn, moderate pain tramadol 50mg  every 8 hours prn.   Palliative Medicine will follow up in 4 weeks or sooner, if needed.  I spent 30 minutes providing this consultation,  from 3:30pm to 4:00pm More  than 50% of the time in this consultation was spent coordinating communication.   HISTORY OF PRESENT ILLNESS:  Austin Blevins is a 58 y.o.  male with multiple medical problems including severe COPD, GERD, nicotine dependence, depression, restless leg syndrome, persistent cough, shortness of breath, bilateral pleural effusion.  Palliative Care was asked to help address goals of care. Austin Blevins presently lives at home and he states he receives assistance from dear friend Shirlean Mylar. He reports dyspnea sometimes at rest, also shortness of breath with exertion such as if he walks to and from bathroom, approximately 20 feet. He wears oxygen at 2 liters per liter most of the time. He reports cough; some improvement so far since starting antibiotic, prednisone, tessalon perles. He still smokes. He is not able to complete household chores, but is able to complete adl's such as bathing, dressing, grooming. He completed physical therapy around 2 weeks ago for left shoulder pain. He still has intermittent pain; pain varies from sharp, to "dull and achy." he reports a poor to fair appetite; he reports losing 8-10 pounds in the past year; he is drinking 1-2 ensures a day. He was seen by Dr. Raul Del on 10/27/2018; new orders for doxycycline 100mg  bid x 10 day; tessalon perles 100mg  tid prn x 7 days; prednisone 10mg  daily x 30 days, trelegy 100-62.5-25mcg one inh daily. Austin Blevins states that he did not start Trelegy as, his insurance would not pay for it. He states he has continued to use the spiriva and advair as previously ordered. He has a follow up appointment with Dr. Raul Del  in two weeks. Austin Blevins states he does have a Living Will. He states he has a DNR.   CODE STATUS: DNR  PPS: 60% HOSPICE ELIGIBILITY/DIAGNOSIS: TBD  PAST MEDICAL HISTORY:  Past Medical History:  Diagnosis Date  . Anxiety   . COPD (chronic obstructive pulmonary disease) (HCC)   . Depression   . Hydrocephalus (HCC)     SOCIAL HX:  Social History    Tobacco Use  . Smoking status: Current Every Day Smoker    Packs/day: 1.00    Types: Cigarettes    Last attempt to quit: 06/17/2016    Years since quitting: 2.3  . Smokeless tobacco: Never Used  Substance Use Topics  . Alcohol use: No    ALLERGIES:  Allergies  Allergen Reactions  . Acetaminophen Other (See Comments)    Syncope  . Percocet [Oxycodone-Acetaminophen]      PERTINENT MEDICATIONS:  Outpatient Encounter Medications as of 10/31/2018  Medication Sig  . albuterol (PROVENTIL HFA;VENTOLIN HFA) 108 (90 Base) MCG/ACT inhaler Inhale 2 puffs into the lungs every 4 (four) hours as needed for wheezing or shortness of breath.  [name] Kitchen. albuterol (PROVENTIL) (2.5 MG/3ML) 0.083% nebulizer solution USE ONE VIAL IN NEBULIZER EVERY SIX HOURS AS NEEDED FOR WHEEZING OR SHORTNESS OF BREATH  . Fluticasone-Salmeterol (ADVAIR DISKUS) 250-50 MCG/DOSE AEPB Inhale 1 puff into the lungs 2 (two) times daily.  [name] Kitchen. omeprazole (PRILOSEC) 20 MG capsule Take 1 capsule (20 mg total) by mouth daily.  [name] Kitchen. rOPINIRole (REQUIP) 0.5 MG tablet Take 1 tablet (0.5 MG) in morning and 3 tablets (1.5 MG) at bedtime for restless leg syndrome.  . sertraline (ZOLOFT) 25 MG tablet Take 1 tablet (25 mg total) by mouth daily.  [name] Kitchen. SPIRIVA HANDIHALER 18 MCG inhalation capsule INHALE CONTENTS OF 1 CAPSULE AS DIRECTEDONCE A DAY VIA HANDIHALER   No facility-administered encounter medications on file as of 10/31/2018.     PHYSICAL EXAM:   Physical exam deferred.  Luella CookLaToya S Shannyn Jankowiak, NP

## 2018-11-01 ENCOUNTER — Telehealth: Payer: Self-pay | Admitting: Nurse Practitioner

## 2018-11-01 ENCOUNTER — Ambulatory Visit: Payer: Self-pay | Admitting: *Deleted

## 2018-11-01 DIAGNOSIS — Z20822 Contact with and (suspected) exposure to covid-19: Secondary | ICD-10-CM

## 2018-11-01 NOTE — Telephone Encounter (Signed)
Patient with chronic severe COPD and his mother, who he lives with, just tested positive for Covid 19.  Concern for exposure to mother, will order testing.

## 2018-11-01 NOTE — Telephone Encounter (Signed)
Pt referred by Marnee Guarneri, NP to schedule pt for testing after being exposed to the covid-19 virus.  Pt notified to and is scheduled for tomorrow at the [facility] in [place]. Scheduled at 3:45 per his request.  Advised that this is a drive thru test site, so he needs to stay in the car, wear a mask with windows rolled up until ready for testing. Pt voiced understanding,

## 2018-11-01 NOTE — Telephone Encounter (Signed)
Patient is calling to inform office that he has been exposed to positive COVID- patient is concerned because he has been in the office recently- patient was in the office within the week- 10/24/18. Mother tested positive- without symptom today. Brother in law had tested positive and she had been around him- it had 15 days.Sister who is wife(nurse) tested negative.  Patient is concerned about the office- he can in for appointment.  Reason for Disposition . [1] COVID-19 EXPOSURE (Close Contact) AND [2] within last 14 days BUT [3] NO symptoms  Answer Assessment - Initial Assessment Questions 1. CLOSE CONTACT: "Who is the person with the confirmed or suspected COVID-19 infection that you were exposed to?"     Mother tested positive today 2. PLACE of CONTACT: "Where were you when you were exposed to COVID-19?" (e.g., home, school, medical waiting room; which city?)     Lives with mother 3. TYPE of CONTACT: "How much contact was there?" (e.g., sitting next to, live in same house, work in same office, same building)     Lives in same house 4. DURATION of CONTACT: "How long were you in contact with the COVID-19 patient?" (e.g., a few seconds, passed by person, a few minutes, live with the patient)     Lives in same house 5. DATE of CONTACT: "When did you have contact with a COVID-19 patient?" (e.g., how many days ago)     Lives in house 6. TRAVEL: "Have you traveled out of the country recently?" If so, "When and where?"     * Also ask about out-of-state travel, since the CDC has identified some high-risk cities for community spread in the [place].     * Note: Travel becomes less relevant if there is widespread community transmission where the patient lives.     no 7. COMMUNITY SPREAD: "Are there lots of cases of COVID-19 (community spread) where you live?" (See public health department website, if unsure)       Minor spread 8. SYMPTOMS: "Do you have any symptoms?" (e.g., fever, cough, breathing  difficulty)     No symptoms 9. PREGNANCY OR POSTPARTUM: "Is there any chance you are pregnant?" "When was your last menstrual period?" "Did you deliver in the last 2 weeks?"     n/a 10. HIGH RISK: "Do you have any heart or lung problems? Do you have a weak immune system?" (e.g., CHF, COPD, asthma, HIV positive, chemotherapy, renal failure, diabetes mellitus, sickle cell anemia)       COPD  Protocols used: CORONAVIRUS (COVID-19) EXPOSURE-A-AH

## 2018-11-01 NOTE — Telephone Encounter (Signed)
-----   Message from [name], NP sent at 11/01/2018  3:03 PM EDT ----- Name: Austin Blevins   MRN: [id number]  DOB: [birth year]  Insurance: Medicaid/Medicare  Reason for testing:  Patient lives with his mother, mother just tested positive for Covid 69.  Patient very high risk with severe COPD dependent on O2 and smoker + chronic pancreatitis.  Would benefit from testing due to his high risk factor for poor outcomes if Covid present.

## 2018-11-02 ENCOUNTER — Other Ambulatory Visit: Payer: Medicaid Other

## 2018-11-02 DIAGNOSIS — Z20822 Contact with and (suspected) exposure to covid-19: Secondary | ICD-10-CM

## 2018-11-03 LAB — NOVEL CORONAVIRUS, NAA: SARS-CoV-2, NAA: NOT DETECTED

## 2018-11-04 ENCOUNTER — Ambulatory Visit: Payer: Self-pay | Admitting: Pharmacist

## 2018-11-04 ENCOUNTER — Telehealth: Payer: Self-pay

## 2018-11-04 DIAGNOSIS — F17219 Nicotine dependence, cigarettes, with unspecified nicotine-induced disorders: Secondary | ICD-10-CM

## 2018-11-04 DIAGNOSIS — F331 Major depressive disorder, recurrent, moderate: Secondary | ICD-10-CM

## 2018-11-04 DIAGNOSIS — J449 Chronic obstructive pulmonary disease, unspecified: Secondary | ICD-10-CM

## 2018-11-04 NOTE — Chronic Care Management (AMB) (Signed)
Chronic Care Management   Follow Up Note   11/04/2018 Name: Austin Blevins MRN: [id number] DOB: 1960-05-31  Referred by: Marjie Skiffannady, Jolene T, NP Reason for referral : Chronic Care Management (Medication Management)   Austin Blevins is a 58 y.o. year old male who is a primary care patient of [name], Dorie RankJolene T, NP. The CCM team was consulted for assistance with chronic disease management and care coordination needs.    Contacted patient today to discuss status, as his mother recently tested positive for coronavirus. Patient's test results came back negative. He notes that he is doing well, but wonders if he might still catch it since he is living with his mother.   Review of patient status, including review of consultants reports, relevant laboratory and other test results, and collaboration with appropriate care team members and the patient's provider was performed as part of comprehensive patient evaluation and provision of chronic care management services.    Goals Addressed            This Visit's Progress     Patient Stated   . "I have a lot of medications" (pt-stated)       Current Barriers:  . Polypharmacy - patient notes that he had a few medication changes prescribed at recent visit with Aura DialsJolene [name], NP . Notes today that he still has problems with shoulder pain. Denies using anything OTC for pain relief right now . Had palliative care virtual appointment on 10/31/2018. They recommended ibuprofen 400 mg TID PRN for mild pain, tramadol 50 mg Q8H PRN.   Pharmacist Clinical Goal(s):  [name] Kitchen. Over the next 30 days, patient will work with PharmD and PCP to address needs related to optmized medication management  Interventions: . Will collaborate with PCP Aura DialsJolene [name], NP. Recommend taking palliative recommendation for ibuprofen 400 mg TID PRN to help with shoulder pain.   Patient Self Care Activities:  . Self administers medications as prescribed  Please see past updates related  to this goal by clicking on the "Past Updates" button in the selected goal      . "I need to work on my depression" (pt-stated)       Current Barriers:  . Depression, currently limiting his drive to quit smoking. Reports concurrent symptoms of anxiety and depression . Sertraline 25 mg daily started on 09/27/2018  . History of antidepressants per chart review:  o Trazodone- notes that he didn't derive benefit, and made him sleepy o Mirtazapine - notes he was extremely sedated and slept "all day long", did not derive any appetite stimulation benefit because he "was asleep all the time"  Pharmacist Clinical Goal(s):  [name] Kitchen. Over the next 60 days, patient will work with PCP and PharmD for optimized medication management  Interventions: . Discussed patient's mood since starting sertraline - he endorses improvement. Denies any extra energy, but just "feels better" and is "less depressed". Denies tolerability issues with the medication.   Patient Self Care Activities:   Patient will continue to work with LCSW for counseling and support  Please see past updates related to this goal by clicking on the "Past Updates" button in the selected goal      . "My breathing is bad" (pt-stated)       Current Barriers:  . Significant COPD burden, exacerbated by concurrent and continued tobacco abuse. Prescribed Spiriva + Advair + albuterol nebs and HFA PRN o Upon medication review, patient has not picked up Advair from [facility] o Notes that he  knows he should quit smoking, but just doesn't feel motivated to right now. He is smoking <1 ppd currently, slightly down from previously.   Pharmacist Clinical Goal(s):  [name] Kitchen Over the next 60 days, patient will work with PharmD to address needs related to optimized medication management for COPD    Interventions: . Discussed benefit of Advair + Spiriva with patient. He verbalized understanding . Contacted [facility]; asked that they fill Advair. They  will today.  . Called patient, left message letting him know to pick up new inhaler and take BID in combination with Spiriva . Encouraged patient to continue to consider reduction/cessation in tobacco use. Encouraged to let me or primary care provider know, or contact [place] [facility], if he decides he is ready to quit smoking  Patient Self Care Activities:  . Self administers medications as prescribed  Please see past updates related to this goal by clicking on the "Past Updates" button in the selected goal         Plan:  - Discussed coronavirus precautions, including sanitizing of shared surfaces in his home. He verbalized understanding - PharmD will outreach patient in 3-4 weeks to follow up on breathing s/p adding [place], PharmD Clinical Pharmacist [place] [phone]

## 2018-11-04 NOTE — Patient Instructions (Signed)
Visit Information  Goals Addressed            This Visit's Progress     Patient Stated   . "I have a lot of medications" (pt-stated)       Current Barriers:  . Polypharmacy - patient notes that he had a few medication changes prescribed at recent visit with Aura DialsJolene [name], NP . Notes today that he still has problems with shoulder pain. Denies using anything OTC for pain relief right now . Had palliative care virtual appointment on 10/31/2018. They recommended ibuprofen 400 mg TID PRN for mild pain, tramadol 50 mg Q8H PRN.   Pharmacist Clinical Goal(s):  [name] Kitchen. Over the next 30 days, patient will work with PharmD and PCP to address needs related to optmized medication management  Interventions: . Will collaborate with PCP Aura DialsJolene [name], NP. Recommend taking palliative recommendation for ibuprofen 400 mg TID PRN to help with shoulder pain.   Patient Self Care Activities:  . Self administers medications as prescribed  Please see past updates related to this goal by clicking on the "Past Updates" button in the selected goal      . "I need to work on my depression" (pt-stated)       Current Barriers:  . Depression, currently limiting his drive to quit smoking. Reports concurrent symptoms of anxiety and depression . Sertraline 25 mg daily started on 09/27/2018  . History of antidepressants per chart review:  o Trazodone- notes that he didn't derive benefit, and made him sleepy o Mirtazapine - notes he was extremely sedated and slept "all day long", did not derive any appetite stimulation benefit because he "was asleep all the time"  Pharmacist Clinical Goal(s):  [name] Kitchen. Over the next 60 days, patient will work with PCP and PharmD for optimized medication management  Interventions: . Discussed patient's mood since starting sertraline - he endorses improvement. Denies any extra energy, but just "feels better" and is "less depressed". Denies tolerability issues with the medication.   Patient  Self Care Activities:   Patient will continue to work with LCSW for counseling and support  Please see past updates related to this goal by clicking on the "Past Updates" button in the selected goal      . "My breathing is bad" (pt-stated)       Current Barriers:  . Significant COPD burden, exacerbated by concurrent and continued tobacco abuse. Prescribed Spiriva + Advair + albuterol nebs and HFA PRN o Upon medication review, patient has not picked up Advair from [facility] o Notes that he knows he should quit smoking, but just doesn't feel motivated to right now. He is smoking <1 ppd currently, slightly down from previously.   Pharmacist Clinical Goal(s):  [name] Kitchen. Over the next 60 days, patient will work with PharmD to address needs related to optimized medication management for COPD    Interventions: . Discussed benefit of Advair + Spiriva with patient. He verbalized understanding . Contacted [facility]; asked that they fill Advair. They will today.  . Called patient, left message letting him know to pick up new inhaler and take BID in combination with Spiriva . Encouraged patient to continue to consider reduction/cessation in tobacco use. Encouraged to let me or primary care provider know, or contact [place] [facility], if he decides he is ready to quit smoking  Patient Self Care Activities:  . Self administers medications as prescribed  Please see past updates related to this goal by clicking on the "Past Updates"  button in the selected goal         The patient verbalized understanding of instructions provided today and declined a print copy of patient instruction materials.   Plan:  - Discussed coronavirus precautions, including sanitizing of shared surfaces in his home. He verbalized understanding - PharmD will outreach patient in 3-4 weeks to follow up on breathing s/p adding [place], PharmD Clinical Pharmacist [place] [phone]

## 2018-11-15 ENCOUNTER — Ambulatory Visit: Payer: Self-pay | Admitting: *Deleted

## 2018-11-15 DIAGNOSIS — J449 Chronic obstructive pulmonary disease, unspecified: Secondary | ICD-10-CM

## 2018-11-15 NOTE — Chronic Care Management (AMB) (Signed)
  Chronic Care Management   Follow Up Note   11/15/2018 Name: Austin Blevins MRN: [id number] DOB: 1961/01/27  Referred by: [name], NP Reason for referral : No chief complaint on file.   Austin Blevins is a 58 y.o. year old male who is a primary care patient of [name], [name], NP. The CCM team was consulted for assistance with chronic disease management and care coordination needs.    Review of patient status, including review of consultants reports, relevant laboratory and other test results, and collaboration with appropriate care team members and the patient's provider was performed as part of comprehensive patient evaluation and provision of chronic care management services.    Goals Addressed            This Visit's Progress   . COPD (pt-stated)       Current Barriers:  [name] Kitchen Knowledge deficits related to basic COPD self care/management . Knowledge deficit related to importance of energy conservation  . Financial Barriers   Case Manager Clinical Goal(s):  Over the next 90 days, patient will verbalize basic understanding of COPD disease process and self care activities   Interventions:   Provided patient with basic written and verbal COPD education on self care/management/and exacerbation prevention   Discussed Pulmonary Rehab and offered to assist with referral placement Patient states he would really like to move forward with this to improve his endurance.  Collaborated with Palliative Care NP  Ms. Clayton Lefort who stated she felt patient would benefit from Pulmonary Rehab if patient is wanting to pursue it.   Will collaborate with PCP to place referral for Pulmonary Rehab Program through [place].   Message sent to advance rep letting her know patient would like to participate   Patient Self Care Activities:  Takes medications as prescribed including inhalers  Please see past updates related to this goal by clicking on the "Past Updates" button in the  selected goal          The care management team will reach out to the patient again over the next 30 days.  The patient has been provided with contact information for the care management team and has been advised to call with any health related questions or concerns.    Merlene Morse Mickell Birdwell RN, BSN Nurse Case Editor, commissioning [facility]  [phone]) Business Mobile

## 2018-11-17 NOTE — Patient Instructions (Signed)
Thank you allowing the Chronic Care Management Team to be a part of your care! It was a pleasure speaking with you today!  CCM (Chronic Care Management) Team   Deryk Bozman RN, BSN Nurse Care Coordinator  [phone]  [facility] PharmD  Clinical Pharmacist  [phone]  Eula Fried LCSW Clinical Social Worker [phone]  Goals Addressed            This Visit's Progress   . COPD (pt-stated)       Current Barriers:  [name] Kitchen Knowledge deficits related to basic COPD self care/management . Knowledge deficit related to importance of energy conservation  . Financial Barriers   Case Manager Clinical Goal(s):  Over the next 90 days, patient will verbalize basic understanding of COPD disease process and self care activities   Interventions:   Provided patient with basic written and verbal COPD education on self care/management/and exacerbation prevention   Discussed Pulmonary Rehab and offered to assist with referral placement Patient states he would really like to move forward with this to improve his endurance.  Collaborated with Palliative Care NP  Ms. Clayton Lefort who stated she felt patient would benefit from Pulmonary Rehab if patient is wanting to pursue it.   Will collaborate with PCP to place referral for Pulmonary Rehab Program through [place].   Message sent to advance rep letting her know patient would like to participate   Patient Self Care Activities:  Takes medications as prescribed including inhalers  Please see past updates related to this goal by clicking on the "Past Updates" button in the selected goal         The patient verbalized understanding of instructions provided today and declined a print copy of patient instruction materials.   The patient has been provided with contact information for the care management team and has been advised to call with any health related questions or concerns.

## 2018-11-18 ENCOUNTER — Telehealth: Payer: Self-pay

## 2018-11-25 ENCOUNTER — Other Ambulatory Visit: Payer: Self-pay | Admitting: Nurse Practitioner

## 2018-12-09 ENCOUNTER — Telehealth: Payer: Self-pay

## 2018-12-13 ENCOUNTER — Ambulatory Visit: Payer: Self-pay | Admitting: Pharmacist

## 2018-12-13 DIAGNOSIS — J449 Chronic obstructive pulmonary disease, unspecified: Secondary | ICD-10-CM

## 2018-12-13 NOTE — Patient Instructions (Signed)
Visit Information  Goals Addressed            This Visit's Progress     Patient Stated   . "My breathing is bad" (pt-stated)       Current Barriers:  . Significant COPD burden, exacerbated by concurrent and continued tobacco abuse. Prescribed Spiriva + Advair + albuterol nebs and HFA PRN o Notes that his breathing is worse recently. Saw Dr. Raul Del 11/21/2018. Was planning on getting PFTs, but threw his back out and was unable to attend the appointment.  o Also notes that coughing is exacerbating back pain. Reports taking BC powders for the pain, reports that he "doesn't take Tylenol" because it doesn't agree with him. Reports that sitting in hot water does help o Currently taking Advair + Spiriva daily, albuterol HFA TID and albuterol nebs TID. He notes that he feels he has the inspiratory capacity to continue controller inhaler therapy as opposed to switching to nebulized  Pharmacist Clinical Goal(s):  [name] Kitchen Over the next 60 days, patient will work with PharmD to address needs related to optimized medication management for COPD    Interventions: . Discussed with Marnee Guarneri, collaborated with office staff to schedule an appointment with her. In the interim, recommend tylenol, heat therapy, or topical icy hot cream or diclofenac gel. Relayed to patient, he verbalized understanding.   Patient Self Care Activities:  . Self administers medications as prescribed  Please see past updates related to this goal by clicking on the "Past Updates" button in the selected goal         The patient verbalized understanding of instructions provided today and declined a print copy of patient instruction materials.   Plan:  - CCM team will outreach patient in the next 3-4 weeks for continued chronic care management support  Catie Darnelle Maffucci, PharmD Clinical Pharmacist [place] [phone]

## 2018-12-13 NOTE — Chronic Care Management (AMB) (Signed)
  Chronic Care Management   Follow Up Note   12/13/2018 Name: Austin Blevins MRN: [id number] DOB: 08-07-1960  Referred by: [name], NP Reason for referral : Chronic Care Management (Medication Management)   Austin Blevins is a 58 y.o. year old male who is a primary care patient of [name], [name], NP. The CCM team was consulted for assistance with chronic disease management and care coordination needs.    Contacted patient to follow up on medication management.   Review of patient status, including review of consultants reports, relevant laboratory and other test results, and collaboration with appropriate care team members and the patient's provider was performed as part of comprehensive patient evaluation and provision of chronic care management services.    Goals Addressed            This Visit's Progress     Patient Stated   . "My breathing is bad" (pt-stated)       Current Barriers:  . Significant COPD burden, exacerbated by concurrent and continued tobacco abuse. Prescribed Spiriva + Advair + albuterol nebs and HFA PRN o Notes that his breathing is worse recently. Saw Dr. Raul Del 11/21/2018. Was planning on getting PFTs, but threw his back out and was unable to attend the appointment.  o Also notes that coughing is exacerbating back pain. Reports taking BC powders for the pain, reports that he "doesn't take Tylenol" because it doesn't agree with him. Reports that sitting in hot water does help o Currently taking Advair + Spiriva daily, albuterol HFA TID and albuterol nebs TID. He notes that he feels he has the inspiratory capacity to continue controller inhaler therapy as opposed to switching to nebulized  Pharmacist Clinical Goal(s):  [name] Kitchen Over the next 60 days, patient will work with PharmD to address needs related to optimized medication management for COPD    Interventions: . Discussed with Marnee Guarneri, collaborated with office staff to schedule an appointment with  her. In the interim, recommend tylenol, heat therapy, or topical icy hot cream or diclofenac gel. Relayed to patient, he verbalized understanding.   Patient Self Care Activities:  . Self administers medications as prescribed  Please see past updates related to this goal by clicking on the "Past Updates" button in the selected goal         Plan:  - CCM team will outreach patient in the next 3-4 weeks for continued chronic care management support  Catie Darnelle Maffucci, PharmD Clinical Pharmacist [place] [phone]

## 2018-12-19 ENCOUNTER — Other Ambulatory Visit: Payer: Self-pay | Admitting: Specialist

## 2018-12-19 DIAGNOSIS — J449 Chronic obstructive pulmonary disease, unspecified: Secondary | ICD-10-CM

## 2018-12-19 DIAGNOSIS — R0602 Shortness of breath: Secondary | ICD-10-CM

## 2018-12-21 ENCOUNTER — Encounter: Payer: Self-pay | Admitting: Nurse Practitioner

## 2018-12-21 ENCOUNTER — Ambulatory Visit (INDEPENDENT_AMBULATORY_CARE_PROVIDER_SITE_OTHER): Payer: Medicaid Other | Admitting: Nurse Practitioner

## 2018-12-21 ENCOUNTER — Other Ambulatory Visit: Payer: Self-pay

## 2018-12-21 ENCOUNTER — Ambulatory Visit: Payer: Self-pay | Admitting: *Deleted

## 2018-12-21 DIAGNOSIS — M545 Low back pain, unspecified: Secondary | ICD-10-CM | POA: Insufficient documentation

## 2018-12-21 DIAGNOSIS — J449 Chronic obstructive pulmonary disease, unspecified: Secondary | ICD-10-CM

## 2018-12-21 MED ORDER — TRAMADOL HCL 50 MG PO TABS: 25.0000 mg | ORAL_TABLET | Freq: Three times a day (TID) | ORAL | 0 refills | Status: AC | PRN

## 2018-12-21 NOTE — Assessment & Plan Note (Addendum)
In presence of severe COPD and obtaining palliative care.  Will order short period of Tramadol and have reiterated need not to take medication from friends.  Script sent.  Refuses imaging at this time, which is appropriate as improvement noted.  Recommend alternate ice/heat + use of Biofreeze or Voltaren gel.  Return to office for worsening or continued pain.  Will collaborate with palliative team.

## 2018-12-21 NOTE — Patient Instructions (Signed)
Acute Back Pain, Adult Acute back pain is sudden and usually short-lived. It is often caused by an injury to the muscles and tissues in the back. The injury may result from:  A muscle or ligament getting overstretched or torn (strained). Ligaments are tissues that connect bones to each other. Lifting something improperly can cause a back strain.  Wear and tear (degeneration) of the spinal disks. Spinal disks are circular tissue that provides cushioning between the bones of the spine (vertebrae).  Twisting motions, such as while playing sports or doing yard work.  A hit to the back.  Arthritis. You may have a physical exam, lab tests, and imaging tests to find the cause of your pain. Acute back pain usually goes away with rest and home care. Follow these instructions at home: Managing pain, stiffness, and swelling  Take over-the-counter and prescription medicines only as told by your health care provider.  Your health care provider may recommend applying ice during the first 24-48 hours after your pain starts. To do this: ? Put ice in a plastic bag. ? Place a towel between your skin and the bag. ? Leave the ice on for 20 minutes, 2-3 times a day.  If directed, apply heat to the affected area as often as told by your health care provider. Use the heat source that your health care provider recommends, such as a moist heat pack or a heating pad. ? Place a towel between your skin and the heat source. ? Leave the heat on for 20-30 minutes. ? Remove the heat if your skin turns bright red. This is especially important if you are unable to feel pain, heat, or cold. You have a greater risk of getting burned. Activity   Do not stay in bed. Staying in bed for more than 1-2 days can delay your recovery.  Sit up and stand up straight. Avoid leaning forward when you sit, or hunching over when you stand. ? If you work at a desk, sit close to it so you do not need to lean over. Keep your chin tucked  in. Keep your neck drawn back, and keep your elbows bent at a right angle. Your arms should look like the letter "L." ? Sit high and close to the steering wheel when you drive. Add lower back (lumbar) support to your car seat, if needed.  Take short walks on even surfaces as soon as you are able. Try to increase the length of time you walk each day.  Do not sit, drive, or stand in one place for more than 30 minutes at a time. Sitting or standing for long periods of time can put stress on your back.  Do not drive or use heavy machinery while taking prescription pain medicine.  Use proper lifting techniques. When you bend and lift, use positions that put less stress on your back: ? Bend your knees. ? Keep the load close to your body. ? Avoid twisting.  Exercise regularly as told by your health care provider. Exercising helps your back heal faster and helps prevent back injuries by keeping muscles strong and flexible.  Work with a physical therapist to make a safe exercise program, as recommended by your health care provider. Do any exercises as told by your physical therapist. Lifestyle  Maintain a healthy weight. Extra weight puts stress on your back and makes it difficult to have good posture.  Avoid activities or situations that make you feel anxious or stressed. Stress and anxiety increase muscle   tension and can make back pain worse. Learn ways to manage anxiety and stress, such as through exercise. General instructions  Sleep on a firm mattress in a comfortable position. Try lying on your side with your knees slightly bent. If you lie on your back, put a pillow under your knees.  Follow your treatment plan as told by your health care provider. This may include: ? Cognitive or behavioral therapy. ? Acupuncture or massage therapy. ? Meditation or yoga. Contact a health care provider if:  You have pain that is not relieved with rest or medicine.  You have increasing pain going down  into your legs or buttocks.  Your pain does not improve after 2 weeks.  You have pain at night.  You lose weight without trying.  You have a fever or chills. Get help right away if:  You develop new bowel or bladder control problems.  You have unusual weakness or numbness in your arms or legs.  You develop nausea or vomiting.  You develop abdominal pain.  You feel faint. Summary  Acute back pain is sudden and usually short-lived.  Use proper lifting techniques. When you bend and lift, use positions that put less stress on your back.  Take over-the-counter and prescription medicines and apply heat or ice as directed by your health care provider. This information is not intended to replace advice given to you by your health care provider. Make sure you discuss any questions you have with your health care provider. Document Released: 05/04/2005 Document Revised: 08/23/2018 Document Reviewed: 12/16/2016 Elsevier Patient Education  2020 Elsevier Inc.  

## 2018-12-21 NOTE — Progress Notes (Signed)
BP 125/85   Pulse (!) 101   Temp 97.6 F (36.4 C) (Oral)   SpO2 96%    Subjective:    Patient ID: Austin BetterBarry K Blumberg, male    DOB: 09/30/1960, 58 y.o.   MRN: [id number]  HPI: Austin Blevins is a 58 y.o. male  Chief Complaint  Patient presents with  . Back Pain    pt states he was coughing and his back started hurting    BACK PAIN "Pulled muscle" three weeks ago while coughing and back started hurting.  Has had similar episodes of this before.  A friend of his gave him a "little white pill" once, Oxycontin he thinks, that made it feel better.  Took this two times and it helped a lot.  Have recommended to him not to use other people's medication due to concern over what he may be using and possible side effects.  He stated "I don't do it often". Reports the pain has improved and is getting better.  Currently he is being followed by palliative team for severe COPD, he feels this is progressing and when advances is open to hospice services.  Last saw pulmonary 11/21/2018 and was placed on daily Prednisone. Duration: weeks Mechanism of injury: coughing and instantly felt pain Location: low back Onset: gradual Severity: 10/10 at worst in beginning, states now the pain is about a 4/10 and can stand up again without issue Quality: dull and aching Frequency: constant Radiation: none Aggravating factors: lifting and bending, coughing Alleviating factors: just the two white pills mentioned above Status: better Treatments attempted: and two little white pills, ice and heat  Relief with NSAIDs?: No NSAIDs Taken Nighttime pain:  no Paresthesias / decreased sensation:  no Bowel / bladder incontinence:  no Fevers:  no Dysuria / urinary frequency:  no  Relevant past medical, surgical, family and social history reviewed and updated as indicated. Interim medical history since our last visit reviewed. Allergies and medications reviewed and updated.  Review of Systems  Constitutional: Negative for  activity change, diaphoresis, fatigue and fever.  Respiratory: Negative for cough, chest tightness, shortness of breath and wheezing.   Cardiovascular: Negative for chest pain, palpitations and leg swelling.  Gastrointestinal: Negative for abdominal distention, abdominal pain, constipation, diarrhea, nausea and vomiting.  Musculoskeletal: Positive for back pain.  Skin: Negative.   Neurological: Negative for dizziness, syncope, weakness, light-headedness, numbness and headaches.  Psychiatric/Behavioral: Negative.     Per HPI unless specifically indicated above     Objective:    BP 125/85   Pulse (!) 101   Temp 97.6 F (36.4 C) (Oral)   SpO2 96%   Wt Readings from Last 3 Encounters:  10/24/18 120 lb (54.4 kg)  10/13/18 120 lb 6.4 oz (54.6 kg)  09/20/18 122 lb (55.3 kg)    Physical Exam Vitals signs and nursing note reviewed.  Constitutional:      General: He is not in acute distress.    Appearance: He is well-developed and underweight. He is not ill-appearing.  HENT:     Head: Normocephalic and atraumatic.     Right Ear: Hearing normal. No drainage.     Left Ear: Hearing normal. No drainage.  Eyes:     General: Lids are normal.        Right eye: No discharge.        Left eye: No discharge.     Conjunctiva/sclera: Conjunctivae normal.     Pupils: Pupils are equal, round, and reactive to light.  Neck:     Musculoskeletal: Normal range of motion and neck supple.  Cardiovascular:     Rate and Rhythm: Normal rate and regular rhythm.     Heart sounds: Normal heart sounds, S1 normal and S2 normal. No murmur. No gallop.   Pulmonary:     Effort: Pulmonary effort is normal.     Breath sounds: Normal breath sounds.  Abdominal:     General: Bowel sounds are normal.     Palpations: Abdomen is soft.  Musculoskeletal:     Lumbar back: He exhibits decreased range of motion, tenderness and pain. He exhibits no bony tenderness, no swelling, no edema, no laceration and no spasm.      Right lower leg: No edema.     Left lower leg: No edema.     Comments: Mild tenderness on palpation of mid-lower back.  No rashes noted.  Decreased flexion and extension, able to rotate and perform lateral movement without issue.  Reports mild discomfort with flexion/extension, but able to bend to touch just below knees.  Skin:    General: Skin is warm and dry.     Capillary Refill: Capillary refill takes less than 2 seconds.     Findings: No rash.  Neurological:     Mental Status: He is alert and oriented to person, place, and time.     Deep Tendon Reflexes: Reflexes are normal and symmetric.  Psychiatric:        Attention and Perception: Attention normal.        Mood and Affect: Mood normal.        Speech: Speech normal.        Behavior: Behavior normal.        Thought Content: Thought content normal.        Judgment: Judgment normal.   Back Exam:    Inspection:  Normal spinal curvature.  No deformity, ecchymosis, erythema, or lesions   Curvature: Normal   Deformity: no  Ecchymosis: no none  Erythema:  no none  Lesions: no    Palpation:     Midline spinal tenderness: no none                 Paralumbar tenderness: yes bilateral     Parathoracic tenderness: none     Buttocks tenderness: none     Range of Motion:      Flexion: Fingers to Knees     Extension:Decreased     Lateral bending:Normal    Rotation:Normal    Neuro Exam:Lower extremity DTRs normal & symmetric.  Strength and sensation intact.     Patellar DTRs: normal    Results for orders placed or performed in visit on 11/02/18  Novel Coronavirus, NAA ([facility])  Result Value Ref Range   SARS-CoV-2, NAA Not Detected Not Detected      Assessment & Plan:   Problem List Items Addressed This Visit      Other   Low back pain    In presence of severe COPD and obtaining palliative care.  Will order short period of Tramadol and have reiterated need not to take medication from friends.  Script sent.  Refuses imaging at  this time, which is appropriate as improvement noted.  Recommend alternate ice/heat + use of Biofreeze or Voltaren gel.  Return to office for worsening or continued pain.  Will collaborate with palliative team.      Relevant Medications   traMADol (ULTRAM) 50 MG tablet       Follow up plan:  Return in about 2 months (around 02/20/2019) for COPD and mood.

## 2018-12-21 NOTE — Chronic Care Management (AMB) (Signed)
  Chronic Care Management   Follow Up Note   12/21/2018 Name: Austin Blevins MRN: [id number] DOB: [birth year]  Referred by: [name], NP Reason for referral : No chief complaint on file.   Austin Blevins is a 58 y.o. year old male who is a primary care patient of [name], [name], NP. The CCM team was consulted for assistance with chronic disease management and care coordination needs.    Review of patient status, including review of consultants reports, relevant laboratory and other test results, and collaboration with appropriate care team members and the patient's provider was performed as part of comprehensive patient evaluation and provision of chronic care management services.    Goals Addressed            This Visit's Progress   . COPD (pt-stated)       Current Barriers:  [name] Kitchen Knowledge deficits related to basic COPD self care/management . Knowledge deficit related to importance of energy conservation  . Financial Barriers   Case Manager Clinical Goal(s):  Over the next 90 days, patient will verbalize basic understanding of COPD disease process and self care activities   Interventions:   Provided patient with basic written and verbal COPD education on self care/management/and exacerbation prevention   Provided patient with COPD action plan and reinforced importance of daily self assessment  Discussed Pulmonary Rehab and offered to assist with referral placement- Patient states he would really like to move forward with this to improve his endurance.  Placed a call to Arrey and left a message for her to call me back  Message sent to Palliative NP to request their SW involvement in assisting patient with possibly obtaining a new mattress or with finances  Gave patient ensure samples and coupons  Patient at PCP office today related to pain in back from cough  Patient Self Care Activities:  Takes medications as prescribed including inhalers   Please see past updates related to this goal by clicking on the "Past Updates" button in the selected goal          The care management team will reach out to the patient again over the next 30 days.  The patient has been provided with contact information for the care management team and has been advised to call with any health related questions or concerns.    Merlene Morse Lakera Viall RN, BSN Nurse Case Editor, commissioning [facility]  [phone]) Business Mobile

## 2018-12-26 ENCOUNTER — Telehealth: Payer: Self-pay | Admitting: Student

## 2018-12-26 ENCOUNTER — Ambulatory Visit: Payer: Self-pay | Admitting: Nurse Practitioner

## 2018-12-26 NOTE — Telephone Encounter (Signed)
Palliative NP left message for patient regarding follow up. Awaiting return call.

## 2018-12-28 ENCOUNTER — Telehealth: Payer: Self-pay

## 2018-12-29 ENCOUNTER — Other Ambulatory Visit: Payer: Self-pay

## 2018-12-29 ENCOUNTER — Other Ambulatory Visit
Admission: RE | Admit: 2018-12-29 | Discharge: 2018-12-29 | Disposition: A | Discharge status: Home / Self Care | Payer: Medicaid Other | Source: Ambulatory Visit | Attending: Specialist | Admitting: Specialist

## 2018-12-29 DIAGNOSIS — Z01812 Encounter for preprocedural laboratory examination: Secondary | ICD-10-CM | POA: Insufficient documentation

## 2018-12-29 DIAGNOSIS — Z20828 Contact with and (suspected) exposure to other viral communicable diseases: Secondary | ICD-10-CM | POA: Diagnosis not present

## 2018-12-30 LAB — SARS CORONAVIRUS 2 (TAT 6-24 HRS): SARS Coronavirus 2: NEGATIVE

## 2019-01-02 ENCOUNTER — Other Ambulatory Visit: Payer: Self-pay

## 2019-01-02 ENCOUNTER — Telehealth: Payer: Self-pay

## 2019-01-02 ENCOUNTER — Ambulatory Visit: Payer: Medicaid Other | Attending: Specialist

## 2019-01-02 DIAGNOSIS — R0602 Shortness of breath: Secondary | ICD-10-CM | POA: Diagnosis not present

## 2019-01-02 DIAGNOSIS — J449 Chronic obstructive pulmonary disease, unspecified: Secondary | ICD-10-CM | POA: Insufficient documentation

## 2019-01-02 NOTE — Telephone Encounter (Signed)
SW received referral from How[place], NP to follow-up with patient to discuss resources. SW contacted patient. Patient provided brief history and overview of current needs. Patient said he lives at home with his mother. Patient said he was receiving home health but this has ended. Patient is on oxygen at 2L. Patient noted that he does get "out of breath". Patient and SW discussed pulmonary rehab, and patient is open to this. Patient also expressed concerns for his mattress and laying flat on his back. Patient said he tries to prop up but wakes up coughing and short of breath. SW discussed hospital bed and patient is open to that and would like SW to request order. SW notified PCP and Merlene Morse, Therapist, sports of conversation. Janci to follow-up on bed order and pulmonary rehab.

## 2019-01-03 ENCOUNTER — Ambulatory Visit: Payer: Self-pay | Admitting: *Deleted

## 2019-01-03 DIAGNOSIS — F17219 Nicotine dependence, cigarettes, with unspecified nicotine-induced disorders: Secondary | ICD-10-CM

## 2019-01-03 DIAGNOSIS — J449 Chronic obstructive pulmonary disease, unspecified: Secondary | ICD-10-CM

## 2019-01-03 NOTE — Chronic Care Management (AMB) (Signed)
Chronic Care Management   Follow Up Note   01/03/2019 Name: ZAKARIYE NEE MRN: [id number] DOB: [birth year]  Referred by: [name], NP Reason for referral : Care Coordination (Pulmonary Rehab and Hospital Bed ) and Chronic Care Management (COPD/Anxiety )   LEANARD DIMAIO is a 58 y.o. year old male who is a primary care patient of [name], [name], NP. The CCM team was consulted for assistance with chronic disease management and care coordination needs.    Review of patient status, including review of consultants reports, relevant laboratory and other test results, and collaboration with appropriate care team members and the patient's provider was performed as part of comprehensive patient evaluation and provision of chronic care management services.    SDOH (Social Determinants of Health) screening performed today: Financial Strain  Tobacco Use Stress Physical Activity. See Care Plan for related entries.   Outpatient Encounter Medications as of 01/03/2019  Medication Sig Note  . albuterol (PROVENTIL HFA;VENTOLIN HFA) 108 (90 Base) MCG/ACT inhaler Inhale 2 puffs into the lungs every 4 (four) hours as needed for wheezing or shortness of breath. 12/13/2018: Using 4x day  . albuterol (PROVENTIL) (2.5 MG/3ML) 0.083% nebulizer solution USE ONE VIAL IN NEBULIZER EVERY SIX HOURS AS NEEDED FOR WHEEZING OR SHORTNESS OF BREATH 12/13/2018: Using 3x/day  . Fluticasone-Salmeterol (ADVAIR DISKUS) 250-50 MCG/DOSE AEPB Inhale 1 puff into the lungs 2 (two) times daily.   [name] Kitchen omeprazole (PRILOSEC) 20 MG capsule TAKE 1 CAPSULE BY MOUTH ONCE DAILY   . rOPINIRole (REQUIP) 0.5 MG tablet TAKE 1 TABLET BY MOUTH IN THE MORNING AND 3 TABLETS AT BEDTIME FOR RESTLESS LEG SYNDROME.   . sertraline (ZOLOFT) 25 MG tablet Take 1 tablet (25 mg total) by mouth daily.   [name] Kitchen SPIRIVA HANDIHALER 18 MCG inhalation capsule INHALE CONTENTS OF 1 CAPSULE AS DIRECTEDONCE A DAY VIA HANDIHALER    No facility-administered encounter  medications on file as of 01/03/2019.      Goals Addressed            This Visit's Progress   . COPD (pt-stated)       Current Barriers:  [name] Kitchen Knowledge deficits related to basic COPD self care/management . Knowledge deficit related to importance of energy conservation  . Financial Barriers   Case Manager Clinical Goal(s):  Over the next 90 days, patient will verbalize basic understanding of COPD disease process and self care activities   Interventions:   Provided patient with basic written and verbal COPD education on self care/management/and exacerbation prevention   Provided patient with COPD action plan and reinforced importance of daily self assessment  Discussed Pulmonary Rehab and offered to assist with referral placement- Patient states he would really like to move forward with this to improve his endurance.  Placed a call to [facility] and Talked with her about patient's continued desire to do pulmonary rehab. Rep stated she would see if patient's insurance would go through and let me know.   Message sent to Palliative NP to request their SW involvement in assisting patient with possibly obtaining a new mattress or with finances- Message from [place] with Palliative care stating after talking with the patient she felt he would benefit form a hospital bed. Collaboration with patient, [facility] and PCP to complete necessary paperwork. Hospital bed with Gel overlay ordered per pcp.  Patient stated he had a rough morning and felt like he had a panic attack. He stated he couldn't catch his breath for  what felt like 45 minutes. He stated he had been having more frequent anxiety. Encouraged patient to use purse lipped breathing during these episodes. Plan to talk with Palliative NP and NP about these symptoms for suggestions on management related to patient's advanced COPD.   Patient Self Care Activities:  Takes medications as prescribed including inhalers   Please see past updates related to this goal by clicking on the "Past Updates" button in the selected goal          The care management team will reach out to the patient again over the next 30 days.  The patient has been provided with contact information for the care management team and has been advised to call with any health related questions or concerns.    Ma RingsJanci Kynslei Art RN, BSN Nurse Case Education officer, communityManager [facility]  [phone]) Business Mobile

## 2019-01-10 ENCOUNTER — Ambulatory Visit: Payer: Self-pay | Admitting: *Deleted

## 2019-01-10 DIAGNOSIS — J449 Chronic obstructive pulmonary disease, unspecified: Secondary | ICD-10-CM

## 2019-01-10 NOTE — Chronic Care Management (AMB) (Signed)
Chronic Care Management   Follow Up Note   01/10/2019 Name: Austin Blevins MRN: [id number] DOB: 1960-09-12  Referred by: Marjie Skiffannady, Jolene T, NP Reason for referral : No chief complaint on file.   Austin Blevins is a 58 y.o. year old male who is a primary care patient of [name], Dorie RankJolene T, NP. The CCM team was consulted for assistance with chronic disease management and care coordination needs.    Review of patient status, including review of consultants reports, relevant laboratory and other test results, and collaboration with appropriate care team members and the patient's provider was performed as part of comprehensive patient evaluation and provision of chronic care management services.    SDOH (Social Determinants of Health) screening performed today: Financial Strain  Tobacco Use. See Care Plan for related entries.   Outpatient Encounter Medications as of 01/10/2019  Medication Sig Note  . albuterol (PROVENTIL HFA;VENTOLIN HFA) 108 (90 Base) MCG/ACT inhaler Inhale 2 puffs into the lungs every 4 (four) hours as needed for wheezing or shortness of breath. 12/13/2018: Using 4x day  . albuterol (PROVENTIL) (2.5 MG/3ML) 0.083% nebulizer solution USE ONE VIAL IN NEBULIZER EVERY SIX HOURS AS NEEDED FOR WHEEZING OR SHORTNESS OF BREATH 12/13/2018: Using 3x/day  . Fluticasone-Salmeterol (ADVAIR DISKUS) 250-50 MCG/DOSE AEPB Inhale 1 puff into the lungs 2 (two) times daily.   [name] Kitchen. omeprazole (PRILOSEC) 20 MG capsule TAKE 1 CAPSULE BY MOUTH ONCE DAILY   . rOPINIRole (REQUIP) 0.5 MG tablet TAKE 1 TABLET BY MOUTH IN THE MORNING AND 3 TABLETS AT BEDTIME FOR RESTLESS LEG SYNDROME.   . sertraline (ZOLOFT) 25 MG tablet Take 1 tablet (25 mg total) by mouth daily.   [name] Kitchen. SPIRIVA HANDIHALER 18 MCG inhalation capsule INHALE CONTENTS OF 1 CAPSULE AS DIRECTEDONCE A DAY VIA HANDIHALER    No facility-administered encounter medications on file as of 01/10/2019.      Goals Addressed            This Visit's Progress    . COPD (pt-stated)       Current Barriers:  [name] Kitchen. Knowledge deficits related to basic COPD self care/management . Knowledge deficit related to importance of energy conservation  . Financial Barriers   Case Manager Clinical Goal(s):  Over the next 90 days, patient will verbalize basic understanding of COPD disease process and self care activities   Interventions:   Provided patient with basic written and verbal COPD education on self care/management/and exacerbation prevention   Provided patient with COPD action plan and reinforced importance of daily self assessment  Discussed Pulmonary Rehab and offered to assist with referral placement- Patient states he would really like to move forward with this to improve his endurance.  Placed a call to [facility] Rep and Talked with her about patient's continued desire to do pulmonary rehab. Rep stated she would see if patient's insurance would go through and let me know. - Awaiting Call Back   Patient called to make me aware the Hospital bed has been so helpful and has allowed him to rest a lot better. Patient very thankful.   [facility]   Made patient aware he will be followed by a new Palliative care NP and she would be reaching out to him in the next few weeks, but if he had acute needs to let her know.   Patient stated he was having a good  day.    Patient Self Care Activities:  Takes medications as prescribed including inhalers  Please see past updates related to this goal by clicking on the "Past Updates" button in the selected goal          The care management team will reach out to the patient again over the next 30 days.  The patient has been provided with contact information for the care management team and has been advised to call with any health related questions or concerns.    Merlene Morse Leviathan Macera RN, BSN Nurse Case  Editor, [facility]  [phone]) Business Mobile

## 2019-01-13 ENCOUNTER — Telehealth: Payer: Self-pay

## 2019-01-24 ENCOUNTER — Other Ambulatory Visit: Payer: Self-pay | Admitting: Nurse Practitioner

## 2019-01-25 ENCOUNTER — Telehealth: Payer: Self-pay

## 2019-01-30 ENCOUNTER — Ambulatory Visit: Payer: Self-pay | Admitting: *Deleted

## 2019-01-30 DIAGNOSIS — J449 Chronic obstructive pulmonary disease, unspecified: Secondary | ICD-10-CM

## 2019-01-30 NOTE — Chronic Care Management (AMB) (Signed)
Chronic Care Management   Follow Up Note   01/30/2019 Name: Austin Blevins Armenti MRN: [id number] DOB: 1960/07/11  Referred by: Marjie Skiffannady, Jolene T, NP Reason for referral : No chief complaint on file.   Austin Blevins Youtsey is a 58 y.o. year old male who is a primary care patient of [name], Dorie RankJolene T, NP. The CCM team was consulted for assistance with chronic disease management and care coordination needs.    Review of patient status, including review of consultants reports, relevant laboratory and other test results, and collaboration with appropriate care team members and the patient's provider was performed as part of comprehensive patient evaluation and provision of chronic care management services.    SDOH (Social Determinants of Health) screening performed today: Physical Activity. See Care Plan for related entries.   Advanced Directives Status: N See Care Plan and Vynca application for related entries.  Outpatient Encounter Medications as of 01/30/2019  Medication Sig Note  . albuterol (PROVENTIL HFA;VENTOLIN HFA) 108 (90 Base) MCG/ACT inhaler Inhale 2 puffs into the lungs every 4 (four) hours as needed for wheezing or shortness of breath. 12/13/2018: Using 4x day  . albuterol (PROVENTIL) (2.5 MG/3ML) 0.083% nebulizer solution USE ONE VIAL IN NEBULIZER EVERY SIX HOURS AS NEEDED FOR WHEEZING OR SHORTNESS OF BREATH 12/13/2018: Using 3x/day  . Fluticasone-Salmeterol (ADVAIR DISKUS) 250-50 MCG/DOSE AEPB Inhale 1 puff into the lungs 2 (two) times daily.   [name] Kitchen. omeprazole (PRILOSEC) 20 MG capsule TAKE 1 CAPSULE BY MOUTH EVERY DAY   . rOPINIRole (REQUIP) 0.5 MG tablet TAKE 1 TABLET BY MOUTH IN THE MORNING AND 3 TABLETS AT BEDTIME FOR RESTLESS LEG SYNDROME.   . sertraline (ZOLOFT) 25 MG tablet TAKE 1 TABLET BY MOUTH DAILY   . SPIRIVA HANDIHALER 18 MCG inhalation capsule INHALE CONTENTS OF 1 CAPSULE AS DIRECTEDONCE A DAY VIA HANDIHALER    No facility-administered encounter medications on file as of 01/30/2019.       Goals Addressed            This Visit's Progress   . COPD (pt-stated)       Current Barriers:  [name] Kitchen. Knowledge deficits related to basic COPD self care/management . Knowledge deficit related to importance of energy conservation  . Financial Barriers   Case Manager Clinical Goal(s):  Over the next 90 days, patient will verbalize basic understanding of COPD disease process and self care activities   Interventions:   Provided patient with basic written and verbal COPD education on self care/management/and exacerbation prevention   Provided patient with COPD action plan and reinforced importance of daily self assessment  Discussed Pulmonary Rehab and offered to assist with referral placement- Spoke with Susann GivensMeredith Craven RN, BSN [facility] who was able to give me specifics on how to get [name] signed up for Pulmonary Rehab through their program.   Message sent to patient's Pulmonologist Dr. Meredeth IdeFleming requesting referral for Pulmonary Rehab.   Collaborated with patient's PCP about patient's medical question concerning his mother testing positive for Covid- Made patient aware PCP recommended he be tested to be safe. Patient denies any symptoms.   Patient verbalized understanding.    Patient Self Care Activities:  Takes medications as prescribed including inhalers  Please see past updates related to this goal by clicking on the "Past Updates" button in the selected goal          The care management team will reach out to the patient again over the next 30 days.  The patient has  been provided with contact information for the care management team and has been advised to call with any health related questions or concerns.    Merlene Morse Shamell Hittle RN, BSN Nurse Case Editor, [facility]  [phone]) Business Mobile

## 2019-01-31 ENCOUNTER — Other Ambulatory Visit: Payer: Self-pay

## 2019-01-31 DIAGNOSIS — Z20822 Contact with and (suspected) exposure to covid-19: Secondary | ICD-10-CM

## 2019-02-01 LAB — NOVEL CORONAVIRUS, NAA: SARS-CoV-2, NAA: NOT DETECTED

## 2019-02-07 ENCOUNTER — Ambulatory Visit: Payer: Self-pay | Admitting: Pharmacist

## 2019-02-07 DIAGNOSIS — J449 Chronic obstructive pulmonary disease, unspecified: Secondary | ICD-10-CM

## 2019-02-07 NOTE — Chronic Care Management (AMB) (Signed)
Chronic Care Management   Follow Up Note   02/07/2019 Name: Austin Blevins MRN: [id number] DOB: 02-20-1961  Referred by: [name], NP Reason for referral : Chronic Care Management (Medication Management)   VADEN BECHERER is a 58 y.o. year old male who is a primary care patient of [name], [name], NP. The CCM team was consulted for assistance with chronic disease management and care coordination needs.    Contacted patient for medication management review today.   Review of patient status, including review of consultants reports, relevant laboratory and other test results, and collaboration with appropriate care team members and the patient's provider was performed as part of comprehensive patient evaluation and provision of chronic care management services.    SDOH (Social Determinants of Health) screening performed today: None. See Care Plan for related entries.   Advanced Directives Status: N See Care Plan and Vynca application for related entries.  Outpatient Encounter Medications as of 02/07/2019  Medication Sig Note  . albuterol (PROVENTIL HFA;VENTOLIN HFA) 108 (90 Base) MCG/ACT inhaler Inhale 2 puffs into the lungs every 4 (four) hours as needed for wheezing or shortness of breath. 12/13/2018: Using 4x day  . albuterol (PROVENTIL) (2.5 MG/3ML) 0.083% nebulizer solution USE ONE VIAL IN NEBULIZER EVERY SIX HOURS AS NEEDED FOR WHEEZING OR SHORTNESS OF BREATH 12/13/2018: Using 3x/day  . Fluticasone-Salmeterol (ADVAIR DISKUS) 250-50 MCG/DOSE AEPB Inhale 1 puff into the lungs 2 (two) times daily.   [name] Kitchen SPIRIVA HANDIHALER 18 MCG inhalation capsule INHALE CONTENTS OF 1 CAPSULE AS DIRECTEDONCE A DAY VIA HANDIHALER   . omeprazole (PRILOSEC) 20 MG capsule TAKE 1 CAPSULE BY MOUTH EVERY DAY   . rOPINIRole (REQUIP) 0.5 MG tablet TAKE 1 TABLET BY MOUTH IN THE MORNING AND 3 TABLETS AT BEDTIME FOR RESTLESS LEG SYNDROME.   . sertraline (ZOLOFT) 25 MG tablet TAKE 1 TABLET BY MOUTH DAILY    No  facility-administered encounter medications on file as of 02/07/2019.      Goals Addressed            This Visit's Progress     Patient Stated   . PharmD "My breathing is bad" (pt-stated)       Current Barriers:  . Significant COPD burden, exacerbated by concurrent and continued tobacco abuse. Prescribed Spiriva + Advair + albuterol nebs and HFA PRN o Notes that breathing is worse when he first gets up in the morning; in fact, we had to end our call at 11 am d/t coughing exacerbated by talking, so I called back later in the afternoon o He notes he is only getting a 5 day supply of albuterol nebs at a time, and due to the inconvenience of going to the pharmacy that frequently, he is not using this medication 4 times a day, as he feels he needs  Pharmacist Clinical Goal(s):  [name] Kitchen Over the next 90 days, patient will work with PharmD to address needs related to optimized medication management for COPD    Interventions: . Contacted [facility] to discuss albuterol nebulizer prescription. Pharmacist notes that the most recent prescription came through as 75 mL, or 25 vials. Recommending resending prescription for 1080 mL (3 mL x QID x 90 days). Will collaborate with Marnee Guarneri on this.  .  Patient Self Care Activities:  . Self administers medications as prescribed  Please see past updates related to this goal by clicking on the "Past Updates" button in the selected goal  Plan:  - CCM team will outreach patient in the next 4-6 weeks for continued medication management support  Catie Feliz Beam, PharmD Clinical Pharmacist [facility] [phone]

## 2019-02-07 NOTE — Patient Instructions (Signed)
Visit Information  Goals Addressed            This Visit's Progress     Patient Stated   . PharmD "My breathing is bad" (pt-stated)       Current Barriers:  . Significant COPD burden, exacerbated by concurrent and continued tobacco abuse. Prescribed Spiriva + Advair + albuterol nebs and HFA PRN o Notes that breathing is worse when he first gets up in the morning; in fact, we had to end our call at 11 am d/t coughing exacerbated by talking, so I called back later in the afternoon o He notes he is only getting a 5 day supply of albuterol nebs at a time, and due to the inconvenience of going to the pharmacy that frequently, he is not using this medication 4 times a day, as he feels he needs  Pharmacist Clinical Goal(s):  [name] Kitchen Over the next 90 days, patient will work with PharmD to address needs related to optimized medication management for COPD    Interventions: . Contacted [facility] to discuss albuterol nebulizer prescription. Pharmacist notes that the most recent prescription came through as 75 mL, or 25 vials. Recommending resending prescription for 1080 mL (3 mL x QID x 90 days). Will collaborate with Marnee Guarneri on this.  .  Patient Self Care Activities:  . Self administers medications as prescribed  Please see past updates related to this goal by clicking on the "Past Updates" button in the selected goal         The patient verbalized understanding of instructions provided today and declined a print copy of patient instruction materials.    Plan:  - CCM team will outreach patient in the next 4-6 weeks for continued medication management support  Catie Darnelle Maffucci, PharmD Clinical Pharmacist [place] [phone]

## 2019-02-08 ENCOUNTER — Other Ambulatory Visit: Payer: Self-pay | Admitting: Nurse Practitioner

## 2019-02-08 DIAGNOSIS — J449 Chronic obstructive pulmonary disease, unspecified: Secondary | ICD-10-CM

## 2019-02-08 MED ORDER — ALBUTEROL SULFATE (2.5 MG/3ML) 0.083% IN NEBU: INHALATION_SOLUTION | RESPIRATORY_TRACT | 3 refills | Status: DC

## 2019-02-20 ENCOUNTER — Ambulatory Visit (INDEPENDENT_AMBULATORY_CARE_PROVIDER_SITE_OTHER): Payer: Medicaid Other | Admitting: Nurse Practitioner

## 2019-02-20 ENCOUNTER — Encounter: Payer: Self-pay | Admitting: Nurse Practitioner

## 2019-02-20 ENCOUNTER — Other Ambulatory Visit: Payer: Self-pay

## 2019-02-20 VITALS — BP 136/95

## 2019-02-20 DIAGNOSIS — F331 Major depressive disorder, recurrent, moderate: Secondary | ICD-10-CM

## 2019-02-20 DIAGNOSIS — J449 Chronic obstructive pulmonary disease, unspecified: Secondary | ICD-10-CM | POA: Diagnosis not present

## 2019-02-20 MED ORDER — OMEPRAZOLE 20 MG PO CPDR: DELAYED_RELEASE_CAPSULE | ORAL | 3 refills | Status: DC

## 2019-02-20 MED ORDER — SERTRALINE HCL 50 MG PO TABS: 50.0000 mg | ORAL_TABLET | Freq: Every day | ORAL | 2 refills | Status: DC

## 2019-02-20 NOTE — Progress Notes (Signed)
BP (!) 136/95 (BP Location: Left Arm, Patient Position: Sitting)    Subjective:    Patient ID: [name], male    DOB: Nov 18, 1960, 58 y.o.   MRN: [id number]  HPI: Austin Blevins is a 58 y.o. male  Chief Complaint  Patient presents with  . COPD  . Depression    . This visit was completed via telephone due to the restrictions of the COVID-19 pandemic. All issues as above were discussed and addressed but no physical exam was performed. If it was felt that the patient should be evaluated in the office, they were directed there. The patient verbally consented to this visit. Patient was unable to complete an audio/visual visit due to Lack of equipment. Due to the catastrophic nature of the COVID-19 pandemic, this visit was done through audio contact only. . Location of the patient: home . Location of the provider: work . Those involved with this call:  . Provider: Aura Dials, DNP . CMA: Elton Sin, CMA . Front Desk/Registration: Harriet Pho  . Time spent on call: 15 minutes on the phone discussing health concerns. 10 minutes total spent in review of patient's record and preparation of their chart.  . I verified patient identity using two factors (patient name and date of birth). Patient consents verbally to being seen via telemedicine visit today.    COPD Continues on Advair, Spiriva, and Albuterol.  Followed by Dr. Meredeth Ide with last visit 10/27/18.  Currently using hospital bed, which he reports has made a drastic improvement for him and his back & shoulder pain with his COPD.   He is also followed by palliative team. COPD status: stable, progressive Satisfied with current treatment?: yes Oxygen use: yes, 2L continuous Dyspnea frequency: present Cough frequency: present chronic Rescue inhaler frequency:  5-6 times a day Limitation of activity: yes Productive cough: chronic Last Spirometry: 10/24/2018 Pneumovax: Up to Date Influenza: Up to Date   DEPRESSION Continues on  Sertraline 25 MG.  This dose had been helping him significantly during past visits.  He did recently lose his brother-in-law who was a good friend to him.  States this has decreased his mood and made him more sad.   Mood status: stable Satisfied with current treatment?: yes Symptom severity: mild  Duration of current treatment : months Side effects: no Medication compliance: good compliance Psychotherapy/counseling: none Previous psychiatric medications: Sertraline Depressed mood: yes Anxious mood: no Anhedonia: no Significant weight loss or gain: due to COPD Insomnia: yes hard to stay asleep Fatigue: no Feelings of worthlessness or guilt: no Impaired concentration/indecisiveness: no Suicidal ideations: no Hopelessness: no Crying spells: no Depression screen [facility] 2/9 02/20/2019 09/20/2018 08/09/2018 07/19/2018 06/24/2017  Decreased Interest 1 3 1 1 3   Down, Depressed, Hopeless 1 3 1 1 3   PHQ - 2 Score 2 6 2 2 6   Altered sleeping 3 1 2 3 1   Tired, decreased energy 3 3 3 3 3   Change in appetite 2 1 3 3 3   Feeling bad or failure about yourself  2 2 2 2 1   Trouble concentrating 3 1 0 0 1  Moving slowly or fidgety/restless 1 0 0 3 2  Suicidal thoughts 0 0 0 0 0  PHQ-9 Score 16 14 12 16 17   Difficult doing work/chores Extremely dIfficult - Somewhat difficult Somewhat difficult -   GAD 7 : Generalized Anxiety Score 06/24/2017  Nervous, Anxious, on Edge 3  Control/stop worrying 3  Worry too much - different things 3  Trouble  relaxing 3  Restless 3  Easily annoyed or irritable 3  Afraid - awful might happen 1  Total GAD 7 Score 19  Anxiety Difficulty Very difficult   Relevant past medical, surgical, family and social history reviewed and updated as indicated. Interim medical history since our last visit reviewed. Allergies and medications reviewed and updated.  Review of Systems  Constitutional: Negative for activity change, diaphoresis, fatigue and fever.  Respiratory: Negative for  cough, chest tightness, shortness of breath and wheezing.   Cardiovascular: Negative for chest pain, palpitations and leg swelling.  Gastrointestinal: Negative for abdominal distention, abdominal pain, constipation, diarrhea, nausea and vomiting.  Neurological: Negative for dizziness, syncope, weakness, light-headedness, numbness and headaches.  Psychiatric/Behavioral: Negative for decreased concentration, self-injury, sleep disturbance and suicidal ideas. The patient is not nervous/anxious.     Per HPI unless specifically indicated above     Objective:    BP (!) 136/95 (BP Location: Left Arm, Patient Position: Sitting)   Wt Readings from Last 3 Encounters:  10/24/18 120 lb (54.4 kg)  10/13/18 120 lb 6.4 oz (54.6 kg)  09/20/18 122 lb (55.3 kg)    Physical Exam   Unable to perform, telephone visit only.  Results for orders placed or performed in visit on 01/31/19  Novel Coronavirus, NAA ([facility])   Specimen: Oropharyngeal(OP) collection in vial transport medium   OROPHARYNGEA  TESTING  Result Value Ref Range   SARS-CoV-2, NAA Not Detected Not Detected      Assessment & Plan:   Problem List Items Addressed This Visit      Respiratory   Stage 3 severe COPD by GOLD classification ([place]) - Primary    Chronic, ongoing, severe with O2 dependence.  Continue current medication and collaboration with pulmonary, palliative, and CCM team.        Relevant Medications   predniSONE (DELTASONE) 10 MG tablet     Other   Depression, major, recurrent, moderate ([place])    Chronic, ongoing with recent exacerbation due to grieving.  Will increase Sertraline to 50 MG daily, script sent, and follow-up in 4 weeks.  Denies SI/HI.        Relevant Medications   sertraline (ZOLOFT) 50 MG tablet      I discussed the assessment and treatment plan with the patient. The patient was provided an opportunity to ask questions and all were answered. The patient agreed with the plan and demonstrated an  understanding of the instructions.   The patient was advised to call back or seek an in-person evaluation if the symptoms worsen or if the condition fails to improve as anticipated.   I provided 15 minutes of time during this encounter.  Follow up plan: Return in about 4 weeks (around 03/20/2019) for Mood follow-up.

## 2019-02-20 NOTE — Assessment & Plan Note (Signed)
Chronic, ongoing with recent exacerbation due to grieving.  Will increase Sertraline to 50 MG daily, script sent, and follow-up in 4 weeks.  Denies SI/HI.

## 2019-02-20 NOTE — Patient Instructions (Signed)
COPD and Physical Activity °Chronic obstructive pulmonary disease (COPD) is a long-term (chronic) condition that affects the lungs. COPD is a general term that can be used to describe many different lung problems that cause lung swelling (inflammation) and limit airflow, including chronic bronchitis and emphysema. °The main symptom of COPD is shortness of breath, which makes it harder to do even simple tasks. This can also make it harder to exercise and be active. Talk with your health care provider about treatments to help you breathe better and actions you can take to prevent breathing problems during physical activity. °What are the benefits of exercising with COPD? °Exercising regularly is an important part of a healthy lifestyle. You can still exercise and do physical activities even though you have COPD. Exercise and physical activity improve your shortness of breath by increasing blood flow (circulation). This causes your heart to pump more oxygen through your body. Moderate exercise can improve your: °· Oxygen use. °· Energy level. °· Shortness of breath. °· Strength in your breathing muscles. °· Heart health. °· Sleep. °· Self-esteem and feelings of self-worth. °· Depression, stress, and anxiety levels. °Exercise can benefit everyone with COPD. The severity of your disease may affect how hard you can exercise, especially at first, but everyone can benefit. Talk with your health care provider about how much exercise is safe for you, and which activities and exercises are safe for you. °What actions can I take to prevent breathing problems during physical activity? °· Sign up for a pulmonary rehabilitation program. This type of program may include: °? Education about lung diseases. °? Exercise classes that teach you how to exercise and be more active while improving your breathing. This usually involves: °§ Exercise using your lower extremities, such as a stationary bicycle. °§ About 30 minutes of exercise, 2  to 5 times per week, for 6 to 12 weeks °§ Strength training, such as push ups or leg lifts. °? Nutrition education. °? Group classes in which you can talk with others who also have COPD and learn ways to manage stress. °· If you use an oxygen tank, you should use it while you exercise. Work with your health care provider to adjust your oxygen for your physical activity. Your resting flow rate is different from your flow rate during physical activity. °· While you are exercising: °? Take slow breaths. °? Pace yourself and do not try to go too fast. °? Purse your lips while breathing out. Pursing your lips is similar to a kissing or whistling position. °? If doing exercise that uses a quick burst of effort, such as weight lifting: °§ Breathe in before starting the exercise. °§ Breathe out during the hardest part of the exercise (such as raising the weights). °Where to find support °You can find support for exercising with COPD from: °· Your health care provider. °· A pulmonary rehabilitation program. °· Your local health department or community health programs. °· Support groups, online or in-person. Your health care provider may be able to recommend support groups. °Where to find more information °You can find more information about exercising with COPD from: °· American Lung Association: lung.org. °· COPD Foundation: copdfoundation.org. °Contact a health care provider if: °· Your symptoms get worse. °· You have chest pain. °· You have nausea. °· You have a fever. °· You have trouble talking or catching your breath. °· You want to start a new exercise program or a new activity. °Summary °· COPD is a general term that can   be used to describe many different lung problems that cause lung swelling (inflammation) and limit airflow. This includes chronic bronchitis and emphysema. °· Exercise and physical activity improve your shortness of breath by increasing blood flow (circulation). This causes your heart to provide more  oxygen to your body. °· Contact your health care provider before starting any exercise program or new activity. Ask your health care provider what exercises and activities are safe for you. °This information is not intended to replace advice given to you by your health care provider. Make sure you discuss any questions you have with your health care provider. °Document Released: 05/27/2017 Document Revised: 08/24/2018 Document Reviewed: 05/27/2017 °Elsevier Patient Education © 2020 Elsevier Inc. ° °

## 2019-02-20 NOTE — Assessment & Plan Note (Signed)
Chronic, ongoing, severe with O2 dependence. Continue current medication and collaboration with pulmonary, palliative, and CCM team.   

## 2019-02-21 ENCOUNTER — Ambulatory Visit: Payer: Self-pay | Admitting: *Deleted

## 2019-02-21 ENCOUNTER — Other Ambulatory Visit: Payer: Self-pay | Admitting: Nurse Practitioner

## 2019-02-21 DIAGNOSIS — F331 Major depressive disorder, recurrent, moderate: Secondary | ICD-10-CM

## 2019-02-21 DIAGNOSIS — Z20822 Contact with and (suspected) exposure to covid-19: Secondary | ICD-10-CM

## 2019-02-21 DIAGNOSIS — J449 Chronic obstructive pulmonary disease, unspecified: Secondary | ICD-10-CM

## 2019-02-21 NOTE — Chronic Care Management (AMB) (Signed)
Chronic Care Management   Follow Up Note   02/21/2019 Name: Austin Blevins MRN: [id number] DOB: 05/28/1960  Referred by: [name], NP Reason for referral : No chief complaint on file.   Austin Blevins is a 58 y.o. year old male who is a primary care patient of [name], [name], NP. The CCM team was consulted for assistance with chronic disease management and care coordination needs.    Review of patient status, including review of consultants reports, relevant laboratory and other test results, and collaboration with appropriate care team members and the patient's provider was performed as part of comprehensive patient evaluation and provision of chronic care management services.    SDOH (Social Determinants of Health) screening performed today: Financial Strain  Physical Activity. See Care Plan for related entries.   Advanced Directives Status: N See Care Plan and [facility]yn[facility] application for related entries.  Outpatient Encounter Medications as of 02/21/2019  Medication Sig Note  . albuterol (PROVENTIL HFA;VENTOLIN HFA) 108 (90 Base) MCG/ACT inhaler Inhale 2 puffs into the lungs every 4 (four) hours as needed for wheezing or shortness of breath. 12/13/2018: Using 4x day  . albuterol (PROVENTIL) (2.5 MG/3ML) 0.083% nebulizer solution USE ONE VIAL IN NEBULIZER EVERY SIX HOURS AS NEEDED FOR WHEEZING OR SHORTNESS OF BREATH   . Fluticasone-Salmeterol (ADVAIR DISKUS) 250-50 MCG/DOSE AEPB Inhale 1 puff into the lungs 2 (two) times daily.   [name] Kitchen omeprazole (PRILOSEC) 20 MG capsule TAKE 1 CAPSULE BY MOUTH EVERY DAY   . predniSONE (DELTASONE) 10 MG tablet Take 10 mg by mouth daily with breakfast.   . rOPINIRole (REQUIP) 0.5 MG tablet TAKE 1 TABLET BY MOUTH IN THE MORNING AND 3 TABLETS AT BEDTIME FOR RESTLESS LEG SYNDROME.   . sertraline (ZOLOFT) 50 MG tablet Take 1 tablet (50 mg total) by mouth daily.   [name] Kitchen SPIRIVA HANDIHALER 18 MCG inhalation capsule INHALE CONTENTS OF 1 CAPSULE AS DIRECTEDONCE A DAY  VIA HANDIHALER    No facility-administered encounter medications on file as of 02/21/2019.      Goals Addressed            This Visit's Progress   . COPD (pt-stated)       Current Barriers:  [name] Kitchen Knowledge deficits related to basic COPD self care/management . Knowledge deficit related to importance of energy conservation  . Financial Barriers   Case Manager Clinical Goal(s):  Over the next 90 days, patient will verbalize basic understanding of COPD disease process and self care activities   Interventions:   Provided patient with basic written and verbal COPD education on self care/management/and exacerbation prevention   Provided patient with COPD action plan and reinforced importance of daily self assessment  Discussed Pulmonary Rehab and offered to assist with referral placement- Plan to outreach Dr. Verdie Shire office again to request this be addressed at upcoming visit on 11/9. [facility] [phone]  Discussed with patient how he was feeling after recent loss of his very close Brother in [place]. He talked about how they had the service this past Sunday and he was very sad over this. Empathetic Listening.  Patient informed this RNCM he had recently received a letter letting him know he would be receiving Medicare beginning May 19 2019. He discussed he was concerned with the co-pay of the medicare coming out of his check related to current financial difficulties. Made patient aware he could speak with his Medicaid case worker to inquire about Medicaid covering his Medicare Premiums.   Discussed with  patient Palliative care through [facility] and expectations of calls and visits.   Sent a message to [employer] and email to Amy NP with [facility] of patient's recent conversation with PCP, in which he was feeling a bit concerned he had not heard from them and felt it could be related to his mother's recent positive covid testing. Whitney SW plans to outreach patient also and  discuss the aforementioned Medicare/Medicaid situation.     Patient Self Care Activities:  Takes medications as prescribed including inhalers  Please see past updates related to this goal by clicking on the "Past Updates" button in the selected goal          The care management team will reach out to the patient again over the next 30 days.  The patient has been provided with contact information for the care management team and has been advised to call with any health related questions or concerns.    Merlene Morse Pariss Hommes RN, BSN Nurse Case Editor, [facility]  [phone]) Business Mobile

## 2019-02-21 NOTE — Patient Instructions (Signed)
Thank you allowing the Chronic Care Management Team to be a part of your care! It was a pleasure speaking with you today!   CCM (Chronic Care Management) Team   Shatha Hooser RN, BSN Nurse Care Coordinator  [phone]  Catie Travis PharmD  Clinical Pharmacist  [phone]  Brooke Joyce LCSW Clinical Social Worker [phone]    The patient verbalized understanding of instructions provided today and declined a print copy of patient instruction materials.   The patient has been provided with contact information for the care management team and has been advised to call with any health related questions or concerns.   

## 2019-02-21 NOTE — Telephone Encounter (Signed)
Requested medication (s) are due for refill today: yes  Requested medication (s) are on the active medication list: yes  Last refill:  11/25/2018  Future visit scheduled: yes  Notes to clinic:  Parkinsonian agents failed  Requested Prescriptions  Pending Prescriptions Disp Refills   rOPINIRole (REQUIP) 0.5 MG tablet [Pharmacy Med Name: ROPINIROLE HCL 0.5 MG TAB] 120 tablet 1    Sig: TAKE ONE TABLET BY MOUTH EVERY MORNING AND 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     Neurology:  Parkinsonian Agents Failed - 02/21/2019  5:55 PM      Failed - Last BP in normal range    BP Readings from Last 1 Encounters:  02/20/19 (!) 136/95         Passed - Valid encounter within last 12 months    Recent Outpatient Visits          Yesterday Stage 3 severe COPD by GOLD classification ([place])   [place], Jolene T, NP   2 months ago Acute bilateral low back pain without sciatica   [place] [name], Jolene T, NP   4 months ago Stage 3 severe COPD by GOLD classification ([place])   [place], Jolene T, NP   5 months ago Depression, major, recurrent, moderate ([place])   [place], Jolene T, NP   6 months ago Pulmonary emphysema, unspecified emphysema type ([place])   [place], [name], NP

## 2019-02-22 ENCOUNTER — Other Ambulatory Visit: Payer: Medicaid Other | Admitting: Adult Health Nurse Practitioner

## 2019-02-22 ENCOUNTER — Telehealth: Payer: Self-pay

## 2019-02-22 ENCOUNTER — Ambulatory Visit: Payer: Self-pay | Admitting: *Deleted

## 2019-02-22 ENCOUNTER — Telehealth: Payer: Self-pay | Admitting: Adult Health Nurse Practitioner

## 2019-02-22 DIAGNOSIS — Z515 Encounter for palliative care: Secondary | ICD-10-CM

## 2019-02-22 DIAGNOSIS — J449 Chronic obstructive pulmonary disease, unspecified: Secondary | ICD-10-CM

## 2019-02-22 NOTE — Progress Notes (Signed)
Therapist, nutritional Palliative Care Consult Note Telephone: [phone]  Fax: [phone]  PATIENT NAME: Austin Blevins DOB: May 12, 1961 MRN: [id number]  PRIMARY CARE PROVIDER:   [name], NP  REFERRING PROVIDER:  [name], NP [address] [place],  [place] [address]  RESPONSIBLE PARTY:   Self [phone]  Due to the COVID-19 crisis, this visit was done via telemedicine and it was initiated and consent by this patient and or family. Video-audio (telehealth) contact was unable to be done due to technical barriers from the patient's side.    RECOMMENDATIONS and PLAN:  1.  Advanced care planning.  Patient is a DNR.  No changes made today  2.  Stage 3 COPD.  Patient uses O2 @ 2L but not continuously.  States that he has periods when he can walk to the bathroom and not get winded but other times feels like he "ran a marathon" just walking to the bathroom.  Have encouraged him to wear his O2 continuously to see if this may help.  States that he has been using his rescue inhaler 3 times a day.  States he would use it more but he runs out before his next refill.  States he has nebulizer treatments and have encouraged him to start using those BID and to increase to TID if needed.  Have another phone visit in one week to evaluate for effectiveness.  States his pulmonologist is Dr. Meredeth Ide but says he has only seen him once in the past 3 years and that PCP has been managing his COPD.  3.  Appetite.  Patient states that since having COPD he has lost weight.  His current weight right now is 118.  States that he does not have much of an appetite.  States he has always been that way.  He supplements with Ensure as he can afford it.  Have reached out to RN clinical navigator to check if this could be covered under his insurance so he can have it as he needs it to supplement his nutrition.    4.  Depression.  Patient has a lot of illness in his family right now and has lost  his brother-in-law, who was a close friend recently.  States that he feels like his depression isn't "real bad" and his family give him support.  His PCP has recently increased his sertraline to 50 mg daily.  Have encouraged him to call if he needs any support and will monitor for effectiveness of increased sertraline.    5. Support.  States that his family helps him with taking care of cleaning the home and preparing meals but he has to rely on their schedules.  Does not feel like he needs any additional support in the home at this time.  I spent 45 minutes providing this consultation,  from 4:30 to 5:15. More than 50% of the time in this consultation was spent coordinating communication.   HISTORY OF PRESENT ILLNESS:  Austin Blevins is a 58 y.o. year old male with multiple medical problems including severe COPD, GERD, nicotine dependence, depression, restless leg syndrome, persistent cough, shortness of breath. Palliative Care was asked to help address goals of care.   CODE STATUS: DNR  PPS: 60% HOSPICE ELIGIBILITY/DIAGNOSIS: TBD  PAST MEDICAL HISTORY:  Past Medical History:  Diagnosis Date  . Anxiety   . COPD (chronic obstructive pulmonary disease) (HCC)   . Depression   . Hydrocephalus [facility])     SOCIAL  HX:  Social History   Tobacco Use  . Smoking status: Current Every Day Smoker    Packs/day: 1.00    Types: Cigarettes    Last attempt to quit: 06/17/2016    Years since quitting: 2.6  . Smokeless tobacco: Never Used  Substance Use Topics  . Alcohol use: No    ALLERGIES:  Allergies  Allergen Reactions  . Acetaminophen Other (See Comments)    Syncope  . Percocet [Oxycodone-Acetaminophen]      PERTINENT MEDICATIONS:  Outpatient Encounter Medications as of 02/22/2019  Medication Sig  . albuterol (PROVENTIL HFA;VENTOLIN HFA) 108 (90 Base) MCG/ACT inhaler Inhale 2 puffs into the lungs every 4 (four) hours as needed for wheezing or shortness of breath.  [name] Kitchen albuterol (PROVENTIL)  (2.5 MG/3ML) 0.083% nebulizer solution USE ONE VIAL IN NEBULIZER EVERY SIX HOURS AS NEEDED FOR WHEEZING OR SHORTNESS OF BREATH  . Fluticasone-Salmeterol (ADVAIR DISKUS) 250-50 MCG/DOSE AEPB Inhale 1 puff into the lungs 2 (two) times daily.  [name] Kitchen omeprazole (PRILOSEC) 20 MG capsule TAKE 1 CAPSULE BY MOUTH EVERY DAY  . predniSONE (DELTASONE) 10 MG tablet Take 10 mg by mouth daily with breakfast.  . rOPINIRole (REQUIP) 0.5 MG tablet TAKE ONE TABLET BY MOUTH EVERY MORNING AND 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  . sertraline (ZOLOFT) 50 MG tablet Take 1 tablet (50 mg total) by mouth daily.  [name] Kitchen SPIRIVA HANDIHALER 18 MCG inhalation capsule INHALE CONTENTS OF 1 CAPSULE AS DIRECTEDONCE A DAY VIA HANDIHALER   No facility-administered encounter medications on file as of 02/22/2019.       Shekela Goodridge Jenetta Downer, NP

## 2019-02-22 NOTE — Chronic Care Management (AMB) (Addendum)
Chronic Care Management   Follow Up Note   02/22/2019 Name: Austin Blevins MRN: [id number] DOB: 1960/09/04  Referred by: [name], NP Reason for referral : Care Coordination (Pumonary Rehab request)   RAMADAN COUEY is a 58 y.o. year old male who is a primary care patient of [name], [name], NP. The CCM team was consulted for assistance with chronic disease management and care coordination needs.    Review of patient status, including review of consultants reports, relevant laboratory and other test results, and collaboration with appropriate care team members and the patient's provider was performed as part of comprehensive patient evaluation and provision of chronic care management services.    SDOH (Social Determinants of Health) screening performed today: None. See Care Plan for related entries.   Advanced Directives Status: N See Care Plan and Vyn[facility] application for related entries.  Outpatient Encounter Medications as of 02/22/2019  Medication Sig Note  . albuterol (PROVENTIL HFA;VENTOLIN HFA) 108 (90 Base) MCG/ACT inhaler Inhale 2 puffs into the lungs every 4 (four) hours as needed for wheezing or shortness of breath. 12/13/2018: Using 4x day  . albuterol (PROVENTIL) (2.5 MG/3ML) 0.083% nebulizer solution USE ONE VIAL IN NEBULIZER EVERY SIX HOURS AS NEEDED FOR WHEEZING OR SHORTNESS OF BREATH   . Fluticasone-Salmeterol (ADVAIR DISKUS) 250-50 MCG/DOSE AEPB Inhale 1 puff into the lungs 2 (two) times daily.   [name] Kitchen omeprazole (PRILOSEC) 20 MG capsule TAKE 1 CAPSULE BY MOUTH EVERY DAY   . predniSONE (DELTASONE) 10 MG tablet Take 10 mg by mouth daily with breakfast.   . rOPINIRole (REQUIP) 0.5 MG tablet TAKE ONE TABLET BY MOUTH EVERY MORNING AND 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   . sertraline (ZOLOFT) 50 MG tablet Take 1 tablet (50 mg total) by mouth daily.   [name] Kitchen SPIRIVA HANDIHALER 18 MCG inhalation capsule INHALE CONTENTS OF 1 CAPSULE AS DIRECTEDONCE A DAY VIA  HANDIHALER    No facility-administered encounter medications on file as of 02/22/2019.      Goals Addressed            This Visit's Progress   . COPD (pt-stated)       Current Barriers:  [name] Kitchen Knowledge deficits related to basic COPD self care/management . Knowledge deficit related to importance of energy conservation  . Financial Barriers   Case Manager Clinical Goal(s):  Over the next 90 days, patient will verbalize basic understanding of COPD disease process and self care activities   Interventions:   Discussed Pulmonary Rehab and offered to assist with referral placement- Outreached Dr. Leane Platt office again to request this be addressed at upcoming visit on 11/9. [facility] Pulmonary [phone] with receptionist who relayed a message to Dr. Gust Brooms Assistant Caryl Pina B requesting [facility]/Pulmonary Rehab referral. Caryl Pina stated their office does not place Pulmonary Rehab referrals but if I would fax over the referral request she would work on it.   Message sent to [employer] asking if there was a certain form that needed to be filled out.Ailene Ravel stated she would be glad to send the referral request form to Caryl Pina B at fax number [phone].   Patient Self Care Activities:  Takes medications as prescribed including inhalers  Please see past updates related to this goal by clicking on the "Past Updates" button in the selected goal          The care management team will reach out to the patient again over the next 30 days.  Merlene Morse Cai Flott RN, BSN Nurse Case Editor, [facility]  [phone]) Business Mobile

## 2019-02-22 NOTE — Telephone Encounter (Signed)
Spoke with patient and scheduled a follow up telephone visit for today at 4:30pm. Zaydee Aina K. Olena Heckle NP

## 2019-02-22 NOTE — Telephone Encounter (Signed)
SW contacted patient to follow-up on request from [place], [place]. Janci noted question regarding patient's Medicaid. SW discussed with patient. Patient said his Medicare benefits will begin in January 2021 and he is concerned about the monthly premium. SW encouraged patient to contact his Medicaid case worker to discuss options with Medicaid programs. Patient agreed. No additional questions/concerns at this time.

## 2019-02-23 ENCOUNTER — Other Ambulatory Visit: Payer: Self-pay | Admitting: Nurse Practitioner

## 2019-02-23 ENCOUNTER — Other Ambulatory Visit: Payer: Self-pay

## 2019-02-23 NOTE — Telephone Encounter (Signed)
Forwarding medication refill request to PCP for review. 

## 2019-03-01 ENCOUNTER — Other Ambulatory Visit: Payer: Self-pay

## 2019-03-01 ENCOUNTER — Other Ambulatory Visit: Payer: Medicaid Other | Admitting: Adult Health Nurse Practitioner

## 2019-03-01 DIAGNOSIS — Z515 Encounter for palliative care: Secondary | ICD-10-CM

## 2019-03-01 NOTE — Progress Notes (Signed)
Therapist, nutritional Palliative Care Consult Note Telephone: [phone]  Fax: [phone]  PATIENT NAME: Austin Blevins DOB: 1961/02/03 MRN: [id number]  PRIMARY CARE PROVIDER:   [name], NP  REFERRING PROVIDER:  [name], NP [address] [place],  [place] [address]  RESPONSIBLE PARTY:   Self [phone]  Due to the COVID-19 crisis, this visit was done via telemedicine and it was initiated and consent by this patient and or family. Video-audio (telehealth) contact was unable to be done due to technical barriers from the patient's side.     RECOMMENDATIONS and PLAN:  1.  Advanced care planning.  Patient is a DNR.  No changes made today  2.  Stage 3 COPD.  States that he has been using his O2 more continuously and feels like his breathing may be a little better. States that he still wakes up SOB until he coughs up phlegm.  Have suggested trying mucinex BID.  He states that he has been using his neb treatment about once a day but has not noticed any difference.  States that he gets more relief with his rescue inhaler which he uses at least 3 times a day. States that he is really tired all the time and little activity causes him to gasp for air.  Denies fever, chest pain, edema.  Explained disease progression and that having to use his rescue inhaler daily may mean his COPD may not be well managed at this time.  Did state in previous visit that he has not seen his pulmonologist a long time. States that his pulmonology office is supposed to call him soon with his appointment.  Have encouraged him to call the office if he does not hear from them in a few days.  Have encouraged to make sure that he does get an appointment with Dr. Meredeth Ide to address management of his COPD.  He endorses understanding and that he will.  3.  Smoking.  Talked about smoking cessation.  States that he knows he should for his health but that he really does not want to at this time.   States that he has tried patches, gum, and chewing on straws in the past without success.  States that there are few things he still enjoys in life and that this is one of them.  Emphasized safety of smoking around O2 while in use.  He expressed understanding.  This will be an ongoing conversation til he is ready to stop smoking.  I spent 25 minutes providing this consultation,  from 4:30 to 4:55. More than 50% of the time in this consultation was spent coordinating communication.   HISTORY OF PRESENT ILLNESS:  Austin Blevins is a 58 y.o. year old male with multiple medical problems including severeCOPD, GERD, nicotine dependence, depression, restless leg syndrome, persistent cough, shortness of breath. Palliative Care was asked to help address goals of care.   CODE STATUS: DNR  PPS: 60% HOSPICE ELIGIBILITY/DIAGNOSIS: TBD  PAST MEDICAL HISTORY:  Past Medical History:  Diagnosis Date  . Anxiety   . COPD (chronic obstructive pulmonary disease) (HCC)   . Depression   . Hydrocephalus (HCC)     SOCIAL HX:  Social History   Tobacco Use  . Smoking status: Current Every Day Smoker    Packs/day: 1.00    Types: Cigarettes    Last attempt to quit: 06/17/2016    Years since quitting: 2.7  . Smokeless tobacco: Never Used  Substance Use Topics  .  Alcohol use: No    ALLERGIES:  Allergies  Allergen Reactions  . Acetaminophen Other (See Comments)    Syncope  . Percocet [Oxycodone-Acetaminophen]      PERTINENT MEDICATIONS:  Outpatient Encounter Medications as of 03/01/2019  Medication Sig  . albuterol (PROVENTIL) (2.5 MG/3ML) 0.083% nebulizer solution USE ONE VIAL IN NEBULIZER EVERY SIX HOURS AS NEEDED FOR WHEEZING OR SHORTNESS OF BREATH  . albuterol (VENTOLIN HFA) 108 (90 Base) MCG/ACT inhaler INHALE 2 PUFFS INTO THE LUNGS EVERY 4 HOURS AS NEEDED FOR WHEEZING OR SHORTNESS OF BREATH  . Fluticasone-Salmeterol (ADVAIR DISKUS) 250-50 MCG/DOSE AEPB Inhale 1 puff into the lungs 2 (two) times  daily.  [name] Kitchen omeprazole (PRILOSEC) 20 MG capsule TAKE 1 CAPSULE BY MOUTH EVERY DAY  . predniSONE (DELTASONE) 10 MG tablet Take 10 mg by mouth daily with breakfast.  . rOPINIRole (REQUIP) 0.5 MG tablet TAKE ONE TABLET BY MOUTH EVERY MORNING AND 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  . sertraline (ZOLOFT) 50 MG tablet Take 1 tablet (50 mg total) by mouth daily.  [name] Kitchen SPIRIVA HANDIHALER 18 MCG inhalation capsule INHALE CONTENTS OF 1 CAPSULE AS DIRECTEDONCE A DAY VIA HANDIHALER  . [DISCONTINUED] albuterol (PROVENTIL HFA;VENTOLIN HFA) 108 (90 Base) MCG/ACT inhaler Inhale 2 puffs into the lungs every 4 (four) hours as needed for wheezing or shortness of breath.   No facility-administered encounter medications on file as of 03/01/2019.       Shirleyann Montero Jenetta Downer, NP

## 2019-03-09 ENCOUNTER — Other Ambulatory Visit: Payer: Self-pay

## 2019-03-09 ENCOUNTER — Encounter: Payer: Medicaid Other | Attending: Specialist

## 2019-03-09 DIAGNOSIS — F419 Anxiety disorder, unspecified: Secondary | ICD-10-CM | POA: Insufficient documentation

## 2019-03-09 DIAGNOSIS — F17210 Nicotine dependence, cigarettes, uncomplicated: Secondary | ICD-10-CM | POA: Insufficient documentation

## 2019-03-09 DIAGNOSIS — F329 Major depressive disorder, single episode, unspecified: Secondary | ICD-10-CM | POA: Insufficient documentation

## 2019-03-09 DIAGNOSIS — Z79899 Other long term (current) drug therapy: Secondary | ICD-10-CM | POA: Insufficient documentation

## 2019-03-09 DIAGNOSIS — Z7982 Long term (current) use of aspirin: Secondary | ICD-10-CM | POA: Insufficient documentation

## 2019-03-09 DIAGNOSIS — J449 Chronic obstructive pulmonary disease, unspecified: Secondary | ICD-10-CM

## 2019-03-09 NOTE — Progress Notes (Signed)
Virtual Orientation performed. Patient informed when to come in for RD and EP orientation. Diagnosis can be found in [facility] 02/20/2019.

## 2019-03-13 ENCOUNTER — Other Ambulatory Visit: Payer: Self-pay

## 2019-03-13 VITALS — Ht 69.5 in | Wt 120.6 lb

## 2019-03-13 DIAGNOSIS — Z79899 Other long term (current) drug therapy: Secondary | ICD-10-CM | POA: Diagnosis not present

## 2019-03-13 DIAGNOSIS — J449 Chronic obstructive pulmonary disease, unspecified: Secondary | ICD-10-CM | POA: Diagnosis not present

## 2019-03-13 DIAGNOSIS — F17210 Nicotine dependence, cigarettes, uncomplicated: Secondary | ICD-10-CM | POA: Diagnosis not present

## 2019-03-13 DIAGNOSIS — F419 Anxiety disorder, unspecified: Secondary | ICD-10-CM | POA: Diagnosis not present

## 2019-03-13 DIAGNOSIS — F329 Major depressive disorder, single episode, unspecified: Secondary | ICD-10-CM | POA: Diagnosis not present

## 2019-03-13 DIAGNOSIS — Z7982 Long term (current) use of aspirin: Secondary | ICD-10-CM | POA: Diagnosis not present

## 2019-03-13 NOTE — Progress Notes (Signed)
Pulmonary Individual Treatment Plan  Patient Details  Name: Austin Blevins MRN: [id number] Date of Birth: 1961/02/19 Referring Provider:     Pulmonary Rehab from 03/13/2019 in [facility] Cardiac and Pulmonary Rehab  Referring Provider  Raul Del      Initial Encounter Date:    Pulmonary Rehab from 03/13/2019 in [facility] Cardiac and Pulmonary Rehab  Date  03/13/19      Visit Diagnosis: Chronic obstructive pulmonary disease, unspecified COPD type ([place])  Patient's Home Medications on Admission:  Current Outpatient Medications:    albuterol (PROVENTIL) (2.5 MG/3ML) 0.083% nebulizer solution, USE ONE VIAL IN NEBULIZER EVERY SIX HOURS AS NEEDED FOR WHEEZING OR SHORTNESS OF BREATH, Disp: 1080 mL, Rfl: 3   albuterol (VENTOLIN HFA) 108 (90 Base) MCG/ACT inhaler, INHALE 2 PUFFS INTO THE LUNGS EVERY 4 HOURS AS NEEDED FOR WHEEZING OR SHORTNESS OF BREATH, Disp: 17 g, Rfl: 3   Fluticasone-Salmeterol (ADVAIR DISKUS) 250-50 MCG/DOSE AEPB, Inhale 1 puff into the lungs 2 (two) times daily., Disp: 60 each, Rfl: 5   omeprazole (PRILOSEC) 20 MG capsule, TAKE 1 CAPSULE BY MOUTH EVERY DAY, Disp: 90 capsule, Rfl: 3   predniSONE (DELTASONE) 10 MG tablet, Take 10 mg by mouth daily with breakfast., Disp: , Rfl:    rOPINIRole (REQUIP) 0.5 MG tablet, TAKE ONE TABLET BY MOUTH EVERY MORNING AND 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, Disp: 120 tablet, Rfl: 1   sertraline (ZOLOFT) 50 MG tablet, Take 1 tablet (50 mg total) by mouth daily., Disp: 60 tablet, Rfl: 2   SPIRIVA HANDIHALER 18 MCG inhalation capsule, INHALE CONTENTS OF 1 CAPSULE AS DIRECTEDONCE A DAY VIA HANDIHALER, Disp: 30 capsule, Rfl: 2  Past Medical History: Past Medical History:  Diagnosis Date   Anxiety    COPD (chronic obstructive pulmonary disease) ([place])    Depression    Hydrocephalus (HCC)     Tobacco Use: Social History   Tobacco Use  Smoking Status Current Every Day Smoker   Packs/day: 1.00   Years: 40.00   Pack  years: 40.00   Types: Cigarettes  Smokeless Tobacco Never Used  Tobacco Comment   Informed patient that we can give him materials when he is ready to quit.    Labs: Recent Review Flowsheet Data    Labs for ITP Cardiac and Pulmonary Rehab Latest Ref Rng & Units 02/11/2017 08/12/2017 10/24/2018   Cholestrol 100 - 199 mg/dL 165 168 -   LDLCALC 0 - 99 mg/dL 109(H) 118(H) -   HDL >39 mg/dL 34(L) 32(L) -   Trlycerides 0 - 149 mg/dL 112 90 -   Hemoglobin A1c 4.8 - 5.6 % 6.4(H) 6.1(H) 5.6       Pulmonary Assessment Scores: Pulmonary Assessment Scores    Row Name 03/13/19 1705         ADL UCSD   SOB Score total  97     Rest  1     Walk  3     Stairs  5     Bath  5     Dress  4     Shop  5       CAT Score   CAT Score  24       mMRC Score   mMRC Score  4        UCSD: Self-administered rating of dyspnea associated with activities of daily living (ADLs) 6-point scale (0 = "not at all" to 5 = "maximal or unable to do because of breathlessness")  Scoring Scores  range from 0 to 120.  Minimally important difference is 5 units  CAT: CAT can identify the health impairment of COPD patients and is better correlated with disease progression.  CAT has a scoring range of zero to 40. The CAT score is classified into four groups of low (less than 10), medium (10 - 20), high (21-30) and very high (31-40) based on the impact level of disease on health status. A CAT score over 10 suggests significant symptoms.  A worsening CAT score could be explained by an exacerbation, poor medication adherence, poor inhaler technique, or progression of COPD or comorbid conditions.  CAT MCID is 2 points  mMRC: mMRC (Modified Medical Research Council) Dyspnea Scale is used to assess the degree of baseline functional disability in patients of respiratory disease due to dyspnea. No minimal important difference is established. A decrease in score of 1 point or greater is considered a positive change.   Pulmonary  Function Assessment:   Exercise Target Goals: Exercise Program Goal: Individual exercise prescription set using results from initial 6 min walk test and THRR while considering  patients activity barriers and safety.   Exercise Prescription Goal: Initial exercise prescription builds to 30-45 minutes a day of aerobic activity, 2-3 days per week.  Home exercise guidelines will be given to patient during program as part of exercise prescription that the participant will acknowledge.  Activity Barriers & Risk Stratification:   6 Minute Walk: 6 Minute Walk    Row Name 03/13/19 1657         6 Minute Walk   Phase  Initial     Distance  814 feet     Walk Time  5.25 minutes     # of Rest Breaks  1     MPH  1.76     METS  3.74     RPE  17     Perceived Dyspnea   4     VO2 Peak  13     Symptoms  Yes (comment)     Comments  shortness of breath     Resting HR  95 bpm     Resting BP  122/64     Resting Oxygen Saturation   95 %     Exercise Oxygen Saturation  during 6 min walk  83 %     Max Ex. HR  119 bpm     Max Ex. BP  146/66     2 Minute Post BP  134/74       Interval HR   1 Minute HR  107     2 Minute HR  111     3 Minute HR  112     4 Minute HR  113     5 Minute HR  114     6 Minute HR  119     2 Minute Post HR  108     Interval Heart Rate?  Yes       Interval Oxygen   Interval Oxygen?  Yes     Baseline Oxygen Saturation %  95 %     1 Minute Oxygen Saturation %  95 %     1 Minute Liters of Oxygen  2 L     2 Minute Oxygen Saturation %  86 %     2 Minute Liters of Oxygen  2 L     3 Minute Oxygen Saturation %  84 %     3 Minute Liters of Oxygen  2 L     4 Minute Oxygen Saturation %  87 %     4 Minute Liters of Oxygen  2 L     5 Minute Oxygen Saturation %  84 %     5 Minute Liters of Oxygen  2 L     6 Minute Oxygen Saturation %  83 %     6 Minute Liters of Oxygen  2 L     2 Minute Post Oxygen Saturation %  95 %     2 Minute Post Liters of Oxygen  2 L        Oxygen Initial Assessment: Oxygen Initial Assessment - 03/09/19 1335      Home Oxygen   Home Oxygen Device  Home Concentrator;E-Tanks    Sleep Oxygen Prescription  Continuous    Liters per minute  2    Home Exercise Oxygen Prescription  Continuous    Liters per minute  2    Home at Rest Exercise Oxygen Prescription  Continuous    Liters per minute  2    Compliance with Home Oxygen Use  Yes      Initial 6 min Walk   Oxygen Used  Continuous    Liters per minute  2      Program Oxygen Prescription   Program Oxygen Prescription  Continuous    Liters per minute  2      Intervention   Short Term Goals  To learn and exhibit compliance with exercise, home and travel O2 prescription;To learn and understand importance of monitoring SPO2 with pulse oximeter and demonstrate accurate use of the pulse oximeter.;To learn and understand importance of maintaining oxygen saturations>88%;To learn and demonstrate proper pursed lip breathing techniques or other breathing techniques.;To learn and demonstrate proper use of respiratory medications    Long  Term Goals  Exhibits compliance with exercise, home and travel O2 prescription;Verbalizes importance of monitoring SPO2 with pulse oximeter and return demonstration;Maintenance of O2 saturations>88%;Exhibits proper breathing techniques, such as pursed lip breathing or other method taught during program session;Compliance with respiratory medication;Demonstrates proper use of MDIs       Oxygen Re-Evaluation:   Oxygen Discharge (Final Oxygen Re-Evaluation):   Initial Exercise Prescription: Initial Exercise Prescription - 03/13/19 1600      Date of Initial Exercise RX and Referring Provider   Date  03/13/19    Referring Provider  Raul Del      Oxygen   Oxygen  Continuous    Liters  2      Treadmill   MPH  1    Grade  0    Minutes  15    METs  1.77   rest if needed     NuStep   Level  1    SPM  80    Minutes  15    METs  2       Recumbant Elliptical   Level  1    RPM  50    Minutes  15    METs  2      T5 Nustep   Level  1    SPM  80    Minutes  15    METs  2      Prescription Details   Frequency (times per week)  3    Duration  Progress to 30 minutes of continuous aerobic without signs/symptoms of physical distress      Intensity   THRR 40-80% of Max Heartrate  122-149  Ratings of Perceived Exertion  11-15    Perceived Dyspnea  0-4      Resistance Training   Training Prescription  Yes    Weight  3 lb    Reps  10-15       Perform Capillary Blood Glucose checks as needed.  Exercise Prescription Changes: Exercise Prescription Changes    Row Name 03/13/19 1700             Response to Exercise   Blood Pressure (Admit)  122/64       Blood Pressure (Exercise)  146/66       Blood Pressure (Exit)  134/74       Heart Rate (Admit)  95 bpm       Heart Rate (Exercise)  119 bpm       Heart Rate (Exit)  108 bpm       Oxygen Saturation (Admit)  95 %       Oxygen Saturation (Exercise)  83 %       Oxygen Saturation (Exit)  95 %       Rating of Perceived Exertion (Exercise)  17       Perceived Dyspnea (Exercise)  4       Symptoms  shortness of breath          Exercise Comments:   Exercise Goals and Review:   Exercise Goals Re-Evaluation :   Discharge Exercise Prescription (Final Exercise Prescription Changes): Exercise Prescription Changes - 03/13/19 1700      Response to Exercise   Blood Pressure (Admit)  122/64    Blood Pressure (Exercise)  146/66    Blood Pressure (Exit)  134/74    Heart Rate (Admit)  95 bpm    Heart Rate (Exercise)  119 bpm    Heart Rate (Exit)  108 bpm    Oxygen Saturation (Admit)  95 %    Oxygen Saturation (Exercise)  83 %    Oxygen Saturation (Exit)  95 %    Rating of Perceived Exertion (Exercise)  17    Perceived Dyspnea (Exercise)  4    Symptoms  shortness of breath       Nutrition:  Target Goals: Understanding of nutrition guidelines, daily intake  of sodium <1538m, cholesterol <2057m calories 30% from fat and 7% or less from saturated fats, daily to have 5 or more servings of fruits and vegetables.  Biometrics: Pre Biometrics - 03/13/19 1704      Pre Biometrics   Height  5' 9.5" (1.765 m)    Weight  120 lb 9.6 oz (54.7 kg)    BMI (Calculated)  17.56        Nutrition Therapy Plan and Nutrition Goals:   Nutrition Assessments:   Nutrition Goals Re-Evaluation:   Nutrition Goals Discharge (Final Nutrition Goals Re-Evaluation):   Psychosocial: Target Goals: Acknowledge presence or absence of significant depression and/or stress, maximize coping skills, provide positive support system. Participant is able to verbalize types and ability to use techniques and skills needed for reducing stress and depression.   Initial Review & Psychosocial Screening: Initial Psych Review & Screening - 03/09/19 1336      Initial Review   Current issues with  Current Psychotropic Meds;Current Stress Concerns    Source of Stress Concerns  Chronic Illness;Family    Comments  Patient has lost his brother to cancer. His father inlaw has cancer. His ex wife and he has COPD. His family has alot of health issues.  Family Dynamics   Good Support System?  Yes    Comments  He can look to his ex wife for support.      Barriers   Psychosocial barriers to participate in program  The patient should benefit from training in stress management and relaxation.;Psychosocial barriers identified (see note)      Screening Interventions   Interventions  Encouraged to exercise;To provide support and resources with identified psychosocial needs;Provide feedback about the scores to participant    Expected Outcomes  Short Term goal: Utilizing psychosocial counselor, staff and physician to assist with identification of specific Stressors or current issues interfering with healing process. Setting desired goal for each stressor or current issue identified.;Long  Term Goal: Stressors or current issues are controlled or eliminated.;Short Term goal: Identification and review with participant of any Quality of Life or Depression concerns found by scoring the questionnaire.;Long Term goal: The participant improves quality of Life and PHQ9 Scores as seen by post scores and/or verbalization of changes       Quality of Life Scores:  Scores of 19 and below usually indicate a poorer quality of life in these areas.  A difference of  2-3 points is a clinically meaningful difference.  A difference of 2-3 points in the total score of the Quality of Life Index has been associated with significant improvement in overall quality of life, self-image, physical symptoms, and general health in studies assessing change in quality of life.  PHQ-9: Recent Review Flowsheet Data    Depression screen [facility] 2/9 02/20/2019 09/20/2018 08/09/2018 07/19/2018 06/24/2017   Decreased Interest _0 Down, Depressed, Hopeless _1 PHQ - 2 Score _2 Altered sleeping _3 Tired, decreased energy _4 Change in appetite _5 Feeling bad or failure about yourself  _6 Trouble concentrating 3 1 0 0 1   Moving slowly or fidgety/restless 1 0 0 3 2   Suicidal thoughts 0 0 0 0 0   PHQ-9 Score _7 Difficult doing work/chores Extremely dIfficult - Somewhat difficult Somewhat difficult -     Interpretation of Total Score  Total Score Depression Severity:  1-4 = Minimal depression, 5-9 = Mild depression, 10-14 = Moderate depression, 15-19 = Moderately severe depression, 20-27 = Severe depression   Psychosocial Evaluation and Intervention:   Psychosocial Re-Evaluation:   Psychosocial Discharge (Final Psychosocial Re-Evaluation):   Education: Education Goals: Education classes will be provided on a weekly basis, covering required topics. Participant will state understanding/return demonstration of topics presented.  Learning  Barriers/Preferences: Learning Barriers/Preferences - 03/09/19 1334      Learning Barriers/Preferences   Learning Barriers  None    Learning Preferences  Pictoral;Skilled Demonstration       Education Topics:  Initial Evaluation Education: - Verbal, written and demonstration of respiratory meds, oximetry and breathing techniques. Instruction on use of nebulizers and MDIs and importance of monitoring MDI activations.   Pulmonary Rehab from 03/09/2019 in [facility] Cardiac and Pulmonary Rehab  Date  03/09/19  Educator  [facility]  Instruction Review Code  1- Verbalizes Understanding      General Nutrition Guidelines/Fats and Fiber: -Group instruction provided by verbal, written material, models and posters to present the general guidelines for heart healthy nutrition. Gives an explanation and review of  dietary fats and fiber.   Controlling Sodium/Reading Food Labels: -Group verbal and written material supporting the discussion of sodium use in heart healthy nutrition. Review and explanation with models, verbal and written materials for utilization of the food label.   Exercise Physiology & General Exercise Guidelines: - Group verbal and written instruction with models to review the exercise physiology of the cardiovascular system and associated critical values. Provides general exercise guidelines with specific guidelines to those with heart or lung disease.    Aerobic Exercise & Resistance Training: - Gives group verbal and written instruction on the various components of exercise. Focuses on aerobic and resistive training programs and the benefits of this training and how to safely progress through these programs.   Flexibility, Balance, Mind/Body Relaxation: Provides group verbal/written instruction on the benefits of flexibility and balance training, including mind/body exercise modes such as yoga, pilates and tai chi.  Demonstration and skill practice provided.   Stress and Anxiety: -  Provides group verbal and written instruction about the health risks of elevated stress and causes of high stress.  Discuss the correlation between heart/lung disease and anxiety and treatment options. Review healthy ways to manage with stress and anxiety.   Depression: - Provides group verbal and written instruction on the correlation between heart/lung disease and depressed mood, treatment options, and the stigmas associated with seeking treatment.   Exercise & Equipment Safety: - Individual verbal instruction and demonstration of equipment use and safety with use of the equipment.   Pulmonary Rehab from 03/13/2019 in [facility] Cardiac and Pulmonary Rehab  Date  03/13/19  Educator  AS  Instruction Review Code  1- Verbalizes Understanding      Infection Prevention: - Provides verbal and written material to individual with discussion of infection control including proper hand washing and proper equipment cleaning during exercise session.   Pulmonary Rehab from 03/13/2019 in [facility] Cardiac and Pulmonary Rehab  Date  03/13/19  Educator  AS  Instruction Review Code  1- Verbalizes Understanding      Falls Prevention: - Provides verbal and written material to individual with discussion of falls prevention and safety.   Pulmonary Rehab from 03/13/2019 in [facility] Cardiac and Pulmonary Rehab  Date  03/13/19  Educator  AS  Instruction Review Code  1- Verbalizes Understanding      Diabetes: - Individual verbal and written instruction to review signs/symptoms of diabetes, desired ranges of glucose level fasting, after meals and with exercise. Advice that pre and post exercise glucose checks will be done for 3 sessions at entry of program.   Chronic Lung Diseases: - Group verbal and written instruction to review updates, respiratory medications, advancements in procedures and treatments. Discuss use of supplemental oxygen including available portable oxygen systems, continuous and intermittent flow  rates, concentrators, personal use and safety guidelines. Review proper use of inhaler and spacers. Provide informative websites for self-education.    Energy Conservation: - Provide group verbal and written instruction for methods to conserve energy, plan and organize activities. Instruct on pacing techniques, use of adaptive equipment and posture/positioning to relieve shortness of breath.   Triggers and Exacerbations: - Group verbal and written instruction to review types of environmental triggers and ways to prevent exacerbations. Discuss weather changes, air quality and the benefits of nasal washing. Review warning signs and symptoms to help prevent infections. Discuss techniques for effective airway clearance, coughing, and vibrations.   AED/CPR: - Group verbal and written instruction with the use of models to demonstrate the basic use  of the AED with the basic ABC's of resuscitation.   Anatomy and Physiology of the Lungs: - Group verbal and written instruction with the use of models to provide basic lung anatomy and physiology related to function, structure and complications of lung disease.   Anatomy & Physiology of the Heart: - Group verbal and written instruction and models provide basic cardiac anatomy and physiology, with the coronary electrical and arterial systems. Review of Valvular disease and Heart Failure   Cardiac Medications: - Group verbal and written instruction to review commonly prescribed medications for heart disease. Reviews the medication, class of the drug, and side effects.   Know Your Numbers and Risk Factors: -Group verbal and written instruction about important numbers in your health.  Discussion of what are risk factors and how they play a role in the disease process.  Review of Cholesterol, Blood Pressure, Diabetes, and BMI and the role they play in your overall health.   Sleep Hygiene: -Provides group verbal and written instruction about how sleep  can affect your health.  Define sleep hygiene, discuss sleep cycles and impact of sleep habits. Review good sleep hygiene tips.    Other: -Provides group and verbal instruction on various topics (see comments)    Knowledge Questionnaire Score: Knowledge Questionnaire Score - 03/13/19 1707      Knowledge Questionnaire Score   Pre Score  9/18        Core Components/Risk Factors/Patient Goals at Admission: Personal Goals and Risk Factors at Admission - 03/09/19 1339      Core Components/Risk Factors/Patient Goals on Admission    Weight Management  Yes;Weight Gain    Intervention  Weight Management: Develop a combined nutrition and exercise program designed to reach desired caloric intake, while maintaining appropriate intake of nutrient and fiber, sodium and fats, and appropriate energy expenditure required for the weight goal.;Weight Management: Provide education and appropriate resources to help participant work on and attain dietary goals.;Weight Management/Obesity: Establish reasonable short term and long term weight goals.    Expected Outcomes  Short Term: Continue to assess and modify interventions until short term weight is achieved;Long Term: Adherence to nutrition and physical activity/exercise program aimed toward attainment of established weight goal;Understanding recommendations for meals to include 15-35% energy as protein, 25-35% energy from fat, 35-60% energy from carbohydrates, less than 23m of dietary cholesterol, 20-35 gm of total fiber daily;Weight Gain: Understanding of general recommendations for a high calorie, high protein meal plan that promotes weight gain by distributing calorie intake throughout the day with the consumption for 4-5 meals, snacks, and/or supplements;Understanding of distribution of calorie intake throughout the day with the consumption of 4-5 meals/snacks    Tobacco Cessation  Yes    Number of packs per day  1 pack per day    Intervention  Assist the  participant in steps to quit. Provide individualized education and counseling about committing to Tobacco Cessation, relapse prevention, and pharmacological support that can be provided by physician.;OAdvice worker assist with locating and accessing local/national Quit Smoking programs, and support quit date choice.    Expected Outcomes  Short Term: Will demonstrate readiness to quit, by selecting a quit date.;Short Term: Will quit all tobacco product use, adhering to prevention of relapse plan.;Long Term: Complete abstinence from all tobacco products for at least 12 months from quit date.    Improve shortness of breath with ADL's  Yes    Intervention  Provide education, individualized exercise plan and daily activity instruction to help decrease symptoms  of SOB with activities of daily living.    Expected Outcomes  Short Term: Improve cardiorespiratory fitness to achieve a reduction of symptoms when performing ADLs;Long Term: Be able to perform more ADLs without symptoms or delay the onset of symptoms       Core Components/Risk Factors/Patient Goals Review:    Core Components/Risk Factors/Patient Goals at Discharge (Final Review):    ITP Comments: ITP Comments    Row Name 03/09/19 1353 03/13/19 1705         ITP Comments  Virtual Orientation performed. Patient informed when to come in for RD and EP orientation. Diagnosis can be found in [facility] 02/20/2019.  Initial 6MW test complete ITP created and sent to Dr Sabra Heck         Comments: initial ITP

## 2019-03-13 NOTE — Patient Instructions (Addendum)
Patient Instructions  Patient Details  Name: Austin Blevins MRN: [id number] Date of Birth: 05/27/1960 Referring Provider:  Mertie MooresFleming, Herbon E, MD  Below are your personal goals for exercise, nutrition, and risk factors. Our goal is to help you stay on track towards obtaining and maintaining these goals. We will be discussing your progress on these goals with you throughout the program.  Initial Exercise Prescription: Initial Exercise Prescription - 03/13/19 1600      Date of Initial Exercise RX and Referring Provider   Date  03/13/19    Referring Provider  Meredeth IdeFleming      Oxygen   Oxygen  Continuous    Liters  2      Treadmill   MPH  1    Grade  0    Minutes  15    METs  1.77   rest if needed     NuStep   Level  1    SPM  80    Minutes  15    METs  2      Recumbant Elliptical   Level  1    RPM  50    Minutes  15    METs  2      T5 Nustep   Level  1    SPM  80    Minutes  15    METs  2      Prescription Details   Frequency (times per week)  3    Duration  Progress to 30 minutes of continuous aerobic without signs/symptoms of physical distress      Intensity   THRR 40-80% of Max Heartrate  122-149    Ratings of Perceived Exertion  11-15    Perceived Dyspnea  0-4      Resistance Training   Training Prescription  Yes    Weight  3 lb    Reps  10-15       Exercise Goals: Frequency: Be able to perform aerobic exercise two to three times per week in program working toward 2-5 days per week of home exercise.  Intensity: Work with a perceived exertion of 11 (fairly light) - 15 (hard) while following your exercise prescription.  We will make changes to your prescription with you as you progress through the program.   Duration: Be able to do 30 to 45 minutes of continuous aerobic exercise in addition to a 5 minute warm-up and a 5 minute cool-down routine.   Nutrition Goals: Your personal nutrition goals will be established when you do your nutrition analysis with  the dietician.  The following are general nutrition guidelines to follow: Cholesterol < 200mg /day Sodium < 1500mg /day Fiber: Men over 50 yrs - 30 grams per day  Personal Goals: Personal Goals and Risk Factors at Admission - 03/13/19 1710      Core Components/Risk Factors/Patient Goals on Admission    Weight Management  Yes;Weight Gain    Intervention  Weight Management: Develop a combined nutrition and exercise program designed to reach desired caloric intake, while maintaining appropriate intake of nutrient and fiber, sodium and fats, and appropriate energy expenditure required for the weight goal.;Weight Management: Provide education and appropriate resources to help participant work on and attain dietary goals.;Weight Management/Obesity: Establish reasonable short term and long term weight goals.    Admit Weight  120 lb 9.6 oz (54.7 kg)    Goal Weight: Short Term  125 lb (56.7 kg)    Goal Weight: Long Term  130 lb (59  kg)    Expected Outcomes  Short Term: Continue to assess and modify interventions until short term weight is achieved;Long Term: Adherence to nutrition and physical activity/exercise program aimed toward attainment of established weight goal;Understanding recommendations for meals to include 15-35% energy as protein, 25-35% energy from fat, 35-60% energy from carbohydrates, less than  of dietary cholesterol, 20-35 gm of total fiber daily;Weight Gain: Understanding of general recommendations for a high calorie, high protein meal plan that promotes weight gain by distributing calorie intake throughout the day with the consumption for 4-5 meals, snacks, and/or supplements;Understanding of distribution of calorie intake throughout the day with the consumption of 4-5 meals/snacks    Tobacco Cessation  Yes    Number of packs per day  1 pack per day    Intervention  Assist the participant in steps to quit. Provide individualized education and counseling about committing to Tobacco  Cessation, relapse prevention, and pharmacological support that can be provided by physician.;Education officer, environmental, assist with locating and accessing local/national Quit Smoking programs, and support quit date choice.    Expected Outcomes  Short Term: Will demonstrate readiness to quit, by selecting a quit date.;Short Term: Will quit all tobacco product use, adhering to prevention of relapse plan.;Long Term: Complete abstinence from all tobacco products for at least 12 months from quit date.    Improve shortness of breath with ADL's  Yes    Intervention  Provide education, individualized exercise plan and daily activity instruction to help decrease symptoms of SOB with activities of daily living.    Expected Outcomes  Short Term: Improve cardiorespiratory fitness to achieve a reduction of symptoms when performing ADLs;Long Term: Be able to perform more ADLs without symptoms or delay the onset of symptoms       Tobacco Use Initial Evaluation: Social History   Tobacco Use  Smoking Status Current Every Day Smoker  . Packs/day: 1.00  . Years: 40.00  . Pack years: 40.00  . Types: Cigarettes  Smokeless Tobacco Never Used  Tobacco Comment   Informed patient that we can give him materials when he is ready to quit.    Exercise Goals and Review:   Copy of goals given to participant. Patient Instructions  Patient Details  Name: Austin Blevins MRN: [id number] Date of Birth: 1960/07/04 Referring Provider:  Mertie Moores, MD  Below are your personal goals for exercise, nutrition, and risk factors. Our goal is to help you stay on track towards obtaining and maintaining these goals. We will be discussing your progress on these goals with you throughout the program.  Initial Exercise Prescription: Initial Exercise Prescription - 03/13/19 1600      Date of Initial Exercise RX and Referring Provider   Date  03/13/19    Referring Provider  Meredeth Ide      Oxygen   Oxygen  Continuous     Liters  2      Treadmill   MPH  1    Grade  0    Minutes  15    METs  1.77   rest if needed     NuStep   Level  1    SPM  80    Minutes  15    METs  2      Recumbant Elliptical   Level  1    RPM  50    Minutes  15    METs  2      T5 Nustep   Level  1  SPM  80    Minutes  15    METs  2      Prescription Details   Frequency (times per week)  3    Duration  Progress to 30 minutes of continuous aerobic without signs/symptoms of physical distress      Intensity   THRR 40-80% of Max Heartrate  122-149    Ratings of Perceived Exertion  11-15    Perceived Dyspnea  0-4      Resistance Training   Training Prescription  Yes    Weight  3 lb    Reps  10-15       Exercise Goals: Frequency: Be able to perform aerobic exercise two to three times per week in program working toward 2-5 days per week of home exercise.  Intensity: Work with a perceived exertion of 11 (fairly light) - 15 (hard) while following your exercise prescription.  We will make changes to your prescription with you as you progress through the program.   Duration: Be able to do 30 to 45 minutes of continuous aerobic exercise in addition to a 5 minute warm-up and a 5 minute cool-down routine.   Nutrition Goals: Your personal nutrition goals will be established when you do your nutrition analysis with the dietician.  The following are general nutrition guidelines to follow: Cholesterol < 200mg /day Sodium < 1500mg /day Fiber: Men over 50 yrs - 30 grams per day  Personal Goals: Personal Goals and Risk Factors at Admission - 03/13/19 1710      Core Components/Risk Factors/Patient Goals on Admission    Weight Management  Yes;Weight Gain    Intervention  Weight Management: Develop a combined nutrition and exercise program designed to reach desired caloric intake, while maintaining appropriate intake of nutrient and fiber, sodium and fats, and appropriate energy expenditure required for the weight  goal.;Weight Management: Provide education and appropriate resources to help participant work on and attain dietary goals.;Weight Management/Obesity: Establish reasonable short term and long term weight goals.    Admit Weight  120 lb 9.6 oz (54.7 kg)    Goal Weight: Short Term  125 lb (56.7 kg)    Goal Weight: Long Term  130 lb (59 kg)    Expected Outcomes  Short Term: Continue to assess and modify interventions until short term weight is achieved;Long Term: Adherence to nutrition and physical activity/exercise program aimed toward attainment of established weight goal;Understanding recommendations for meals to include 15-35% energy as protein, 25-35% energy from fat, 35-60% energy from carbohydrates, less than 200mg  of dietary cholesterol, 20-35 gm of total fiber daily;Weight Gain: Understanding of general recommendations for a high calorie, high protein meal plan that promotes weight gain by distributing calorie intake throughout the day with the consumption for 4-5 meals, snacks, and/or supplements;Understanding of distribution of calorie intake throughout the day with the consumption of 4-5 meals/snacks    Tobacco Cessation  Yes    Number of packs per day  1 pack per day    Intervention  Assist the participant in steps to quit. Provide individualized education and counseling about committing to Tobacco Cessation, relapse prevention, and pharmacological support that can be provided by physician.;Advice worker, assist with locating and accessing local/national Quit Smoking programs, and support quit date choice.    Expected Outcomes  Short Term: Will demonstrate readiness to quit, by selecting a quit date.;Short Term: Will quit all tobacco product use, adhering to prevention of relapse plan.;Long Term: Complete abstinence from all tobacco products for at least 12  months from quit date.    Improve shortness of breath with ADL's  Yes    Intervention  Provide education, individualized  exercise plan and daily activity instruction to help decrease symptoms of SOB with activities of daily living.    Expected Outcomes  Short Term: Improve cardiorespiratory fitness to achieve a reduction of symptoms when performing ADLs;Long Term: Be able to perform more ADLs without symptoms or delay the onset of symptoms       Tobacco Use Initial Evaluation: Social History   Tobacco Use  Smoking Status Current Every Day Smoker  . Packs/day: 1.00  . Years: 40.00  . Pack years: 40.00  . Types: Cigarettes  Smokeless Tobacco Never Used  Tobacco Comment   Informed patient that we can give him materials when he is ready to quit.    Exercise Goals and Review: Exercise Goals    Row Name 03/13/19 1711             Exercise Goals   Increase Physical Activity  Yes       Intervention  Provide advice, education, support and counseling about physical activity/exercise needs.;Develop an individualized exercise prescription for aerobic and resistive training based on initial evaluation findings, risk stratification, comorbidities and participant's personal goals.       Expected Outcomes  Short Term: Attend rehab on a regular basis to increase amount of physical activity.;Long Term: Add in home exercise to make exercise part of routine and to increase amount of physical activity.;Long Term: Exercising regularly at least 3-5 days a week.       Increase Strength and Stamina  Yes       Intervention  Provide advice, education, support and counseling about physical activity/exercise needs.       Expected Outcomes  Short Term: Increase workloads from initial exercise prescription for resistance, speed, and METs.;Short Term: Perform resistance training exercises routinely during rehab and add in resistance training at home;Long Term: Improve cardiorespiratory fitness, muscular endurance and strength as measured by increased METs and functional capacity ( )       Able to understand and use rate of  perceived exertion (RPE) scale  Yes       Intervention  Provide education and explanation on how to use RPE scale       Expected Outcomes  Short Term: Able to use RPE daily in rehab to express subjective intensity level;Long Term:  Able to use RPE to guide intensity level when exercising independently       Able to understand and use Dyspnea scale  Yes       Intervention  Provide education and explanation on how to use Dyspnea scale       Expected Outcomes  Short Term: Able to use Dyspnea scale daily in rehab to express subjective sense of shortness of breath during exertion;Long Term: Able to use Dyspnea scale to guide intensity level when exercising independently       Knowledge and understanding of Target Heart Rate Range (THRR)  Yes       Intervention  Provide education and explanation of THRR including how the numbers were predicted and where they are located for reference       Expected Outcomes  Short Term: Able to state/look up THRR;Short Term: Able to use daily as guideline for intensity in rehab;Long Term: Able to use THRR to govern intensity when exercising independently       Able to check pulse independently  Yes  Intervention  Provide education and demonstration on how to check pulse in carotid and radial arteries.;Review the importance of being able to check your own pulse for safety during independent exercise       Expected Outcomes  Short Term: Able to explain why pulse checking is important during independent exercise;Long Term: Able to check pulse independently and accurately       Understanding of Exercise Prescription  Yes       Intervention  Provide education, explanation, and written materials on patient's individual exercise prescription       Expected Outcomes  Short Term: Able to explain program exercise prescription;Long Term: Able to explain home exercise prescription to exercise independently          Copy of goals given to participant.

## 2019-03-16 ENCOUNTER — Ambulatory Visit: Payer: Medicaid Other

## 2019-03-20 ENCOUNTER — Other Ambulatory Visit: Payer: Self-pay

## 2019-03-20 ENCOUNTER — Encounter: Payer: Medicaid Other | Attending: Specialist | Admitting: *Deleted

## 2019-03-20 DIAGNOSIS — F17210 Nicotine dependence, cigarettes, uncomplicated: Secondary | ICD-10-CM | POA: Insufficient documentation

## 2019-03-20 DIAGNOSIS — J449 Chronic obstructive pulmonary disease, unspecified: Secondary | ICD-10-CM

## 2019-03-20 DIAGNOSIS — F329 Major depressive disorder, single episode, unspecified: Secondary | ICD-10-CM | POA: Insufficient documentation

## 2019-03-20 DIAGNOSIS — F419 Anxiety disorder, unspecified: Secondary | ICD-10-CM | POA: Diagnosis not present

## 2019-03-20 DIAGNOSIS — Z7982 Long term (current) use of aspirin: Secondary | ICD-10-CM | POA: Insufficient documentation

## 2019-03-20 DIAGNOSIS — Z79899 Other long term (current) drug therapy: Secondary | ICD-10-CM | POA: Insufficient documentation

## 2019-03-20 NOTE — Progress Notes (Signed)
Daily Session Note  Patient Details  Name: Austin Blevins MRN: [id number] Date of Birth: July 27, 1960 Referring Provider:     Pulmonary Rehab from 03/13/2019 in [facility]  Referring Provider  Raul Del      Encounter Date: 03/20/2019  Check In: Session Check In - 03/20/19 1531      Check-In   Supervising physician immediately available to respond to emergencies  See telemetry face sheet for immediately available ER MD    Location  [facility]    Staff Present  Renita Papa, RN Moises Blood, BS, ACSM CEP, Exercise Physiologist;Joseph Tessie Fass RCP,RRT,BSRT    Virtual Visit  No    Medication changes reported      No    Fall or balance concerns reported     No    Warm-up and Cool-down  Performed on first and last piece of equipment    Resistance Training Performed  Yes    VAD Patient?  No    PAD/SET Patient?  No      Pain Assessment   Currently in Pain?  No/denies          Social History   Tobacco Use  Smoking Status Current Every Day Smoker  . Packs/day: 1.00  . Years: 40.00  . Pack years: 40.00  . Types: Cigarettes  Smokeless Tobacco Never Used  Tobacco Comment   Informed patient that we can give him materials when he is ready to quit.    Goals Met:  Independence with exercise equipment Exercise tolerated well No report of cardiac concerns or symptoms Strength training completed today  Goals Unmet:  Not Applicable  Comments: First full day of exercise!  Patient was oriented to gym and equipment including functions, settings, policies, and procedures.  Patient's individual exercise prescription and treatment plan were reviewed.  All starting workloads were established based on the results of the 6 minute walk test done at initial orientation visit.  The plan for exercise progression was also introduced and progression will be customized based on patient's performance and goals.    Dr. Emily Filbert is Medical Director for  [facility].

## 2019-03-22 ENCOUNTER — Encounter: Payer: Self-pay | Admitting: *Deleted

## 2019-03-22 ENCOUNTER — Telehealth: Payer: Self-pay | Admitting: *Deleted

## 2019-03-22 DIAGNOSIS — J449 Chronic obstructive pulmonary disease, unspecified: Secondary | ICD-10-CM

## 2019-03-22 NOTE — Telephone Encounter (Signed)
Austin Blevins called to inform staff his sister, who he saw yesterday, is currently being tested for COVID. Advised patient to wait until she receives her results and we will go from there.

## 2019-03-22 NOTE — Progress Notes (Signed)
Pulmonary Individual Treatment Plan  Patient Details  Name: Austin Blevins MRN: [id number] Date of Birth: 1961/01/31 Referring Provider:     Pulmonary Rehab from 03/13/2019 in [facility] Cardiac and Pulmonary Rehab  Referring Provider  Raul Del      Initial Encounter Date:    Pulmonary Rehab from 03/13/2019 in [facility] Cardiac and Pulmonary Rehab  Date  03/13/19      Visit Diagnosis: Chronic obstructive pulmonary disease, unspecified COPD type ([place])  Patient's Home Medications on Admission:  Current Outpatient Medications:  .  albuterol (PROVENTIL) (2.5 MG/3ML) 0.083% nebulizer solution, USE ONE VIAL IN NEBULIZER EVERY SIX HOURS AS NEEDED FOR WHEEZING OR SHORTNESS OF BREATH, Disp: 1080 mL, Rfl: 3 .  albuterol (VENTOLIN HFA) 108 (90 Base) MCG/ACT inhaler, INHALE 2 PUFFS INTO THE LUNGS EVERY 4 HOURS AS NEEDED FOR WHEEZING OR SHORTNESS OF BREATH, Disp: 17 g, Rfl: 3 .  Fluticasone-Salmeterol (ADVAIR DISKUS) 250-50 MCG/DOSE AEPB, Inhale 1 puff into the lungs 2 (two) times daily., Disp: 60 each, Rfl: 5 .  omeprazole (PRILOSEC) 20 MG capsule, TAKE 1 CAPSULE BY MOUTH EVERY DAY, Disp: 90 capsule, Rfl: 3 .  predniSONE (DELTASONE) 10 MG tablet, Take 10 mg by mouth daily with breakfast., Disp: , Rfl:  .  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  sertraline (ZOLOFT) 50 MG tablet, Take 1 tablet (50 mg total) by mouth daily., Disp: 60 tablet, Rfl: 2 .  SPIRIVA HANDIHALER 18 MCG inhalation capsule, INHALE CONTENTS OF 1 CAPSULE AS DIRECTEDONCE A DAY VIA HANDIHALER, Disp: 30 capsule, Rfl: 2  Past Medical History: Past Medical History:  Diagnosis Date  . Anxiety   . COPD (chronic obstructive pulmonary disease) ([place])   . Depression   . Hydrocephalus (HCC)     Tobacco Use: Social History   Tobacco Use  Smoking Status Current Every Day Smoker  . Packs/day: 1.00  . Years: 40.00  . Pack  years: 40.00  . Types: Cigarettes  Smokeless Tobacco Never Used  Tobacco Comment   Informed patient that we can give him materials when he is ready to quit.    Labs: Recent Review Flowsheet Data    Labs for ITP Cardiac and Pulmonary Rehab Latest Ref Rng & Units 02/11/2017 08/12/2017 10/24/2018   Cholestrol 100 - 199 mg/dL 165 168 -   LDLCALC 0 - 99 mg/dL 109(H) 118(H) -   HDL >39 mg/dL 34(L) 32(L) -   Trlycerides 0 - 149 mg/dL 112 90 -   Hemoglobin A1c 4.8 - 5.6 % 6.4(H) 6.1(H) 5.6       Pulmonary Assessment Scores: Pulmonary Assessment Scores    Row Name 03/13/19 1705         ADL UCSD   SOB Score total  97     Rest  1     Walk  3     Stairs  5     Bath  5     Dress  4     Shop  5       CAT Score   CAT Score  24       mMRC Score   mMRC Score  4        UCSD: Self-administered rating of dyspnea associated with activities of daily living (ADLs) 6-point scale (0 = "not at all" to 5 = "maximal or unable to do because of breathlessness")  Scoring Scores  range from 0 to 120.  Minimally important difference is 5 units  CAT: CAT can identify the health impairment of COPD patients and is better correlated with disease progression.  CAT has a scoring range of zero to 40. The CAT score is classified into four groups of low (less than 10), medium (10 - 20), high (21-30) and very high (31-40) based on the impact level of disease on health status. A CAT score over 10 suggests significant symptoms.  A worsening CAT score could be explained by an exacerbation, poor medication adherence, poor inhaler technique, or progression of COPD or comorbid conditions.  CAT MCID is 2 points  mMRC: mMRC (Modified Medical Research Council) Dyspnea Scale is used to assess the degree of baseline functional disability in patients of respiratory disease due to dyspnea. No minimal important difference is established. A decrease in score of 1 point or greater is considered a positive change.   Pulmonary  Function Assessment:   Exercise Target Goals: Exercise Program Goal: Individual exercise prescription set using results from initial 6 min walk test and THRR while considering  patient's activity barriers and safety.   Exercise Prescription Goal: Initial exercise prescription builds to 30-45 minutes a day of aerobic activity, 2-3 days per week.  Home exercise guidelines will be given to patient during program as part of exercise prescription that the participant will acknowledge.  Activity Barriers & Risk Stratification:   6 Minute Walk: 6 Minute Walk    Row Name 03/13/19 1657         6 Minute Walk   Phase  Initial     Distance  814 feet     Walk Time  5.25 minutes     # of Rest Breaks  1     MPH  1.76     METS  3.74     RPE  17     Perceived Dyspnea   4     VO2 Peak  13     Symptoms  Yes (comment)     Comments  shortness of breath     Resting HR  95 bpm     Resting BP  122/64     Resting Oxygen Saturation   95 %     Exercise Oxygen Saturation  during 6 min walk  83 %     Max Ex. HR  119 bpm     Max Ex. BP  146/66     2 Minute Post BP  134/74       Interval HR   1 Minute HR  107     2 Minute HR  111     3 Minute HR  112     4 Minute HR  113     5 Minute HR  114     6 Minute HR  119     2 Minute Post HR  108     Interval Heart Rate?  Yes       Interval Oxygen   Interval Oxygen?  Yes     Baseline Oxygen Saturation %  95 %     1 Minute Oxygen Saturation %  95 %     1 Minute Liters of Oxygen  2 L     2 Minute Oxygen Saturation %  86 %     2 Minute Liters of Oxygen  2 L     3 Minute Oxygen Saturation %  84 %     3 Minute Liters of Oxygen  2 L     4 Minute Oxygen Saturation %  87 %     4 Minute Liters of Oxygen  2 L     5 Minute Oxygen Saturation %  84 %     5 Minute Liters of Oxygen  2 L     6 Minute Oxygen Saturation %  83 %     6 Minute Liters of Oxygen  2 L     2 Minute Post Oxygen Saturation %  95 %     2 Minute Post Liters of Oxygen  2 L        Oxygen Initial Assessment: Oxygen Initial Assessment - 03/09/19 1335      Home Oxygen   Home Oxygen Device  Home Concentrator;E-Tanks    Sleep Oxygen Prescription  Continuous    Liters per minute  2    Home Exercise Oxygen Prescription  Continuous    Liters per minute  2    Home at Rest Exercise Oxygen Prescription  Continuous    Liters per minute  2    Compliance with Home Oxygen Use  Yes      Initial 6 min Walk   Oxygen Used  Continuous    Liters per minute  2      Program Oxygen Prescription   Program Oxygen Prescription  Continuous    Liters per minute  2      Intervention   Short Term Goals  To learn and exhibit compliance with exercise, home and travel O2 prescription;To learn and understand importance of monitoring SPO2 with pulse oximeter and demonstrate accurate use of the pulse oximeter.;To learn and understand importance of maintaining oxygen saturations>88%;To learn and demonstrate proper pursed lip breathing techniques or other breathing techniques.;To learn and demonstrate proper use of respiratory medications    Long  Term Goals  Exhibits compliance with exercise, home and travel O2 prescription;Verbalizes importance of monitoring SPO2 with pulse oximeter and return demonstration;Maintenance of O2 saturations>88%;Exhibits proper breathing techniques, such as pursed lip breathing or other method taught during program session;Compliance with respiratory medication;Demonstrates proper use of MDI's       Oxygen Re-Evaluation:   Oxygen Discharge (Final Oxygen Re-Evaluation):   Initial Exercise Prescription: Initial Exercise Prescription - 03/13/19 1600      Date of Initial Exercise RX and Referring Provider   Date  03/13/19    Referring Provider  Raul Del      Oxygen   Oxygen  Continuous    Liters  2      Treadmill   MPH  1    Grade  0    Minutes  15    METs  1.77   rest if needed     NuStep   Level  1    SPM  80    Minutes  15    METs  2       Recumbant Elliptical   Level  1    RPM  50    Minutes  15    METs  2      T5 Nustep   Level  1    SPM  80    Minutes  15    METs  2      Prescription Details   Frequency (times per week)  3    Duration  Progress to 30 minutes of continuous aerobic without signs/symptoms of physical distress      Intensity   THRR 40-80% of Max Heartrate  122-149  Ratings of Perceived Exertion  11-15    Perceived Dyspnea  0-4      Resistance Training   Training Prescription  Yes    Weight  3 lb    Reps  10-15       Perform Capillary Blood Glucose checks as needed.  Exercise Prescription Changes: Exercise Prescription Changes    Row Name 03/13/19 1700             Response to Exercise   Blood Pressure (Admit)  122/64       Blood Pressure (Exercise)  146/66       Blood Pressure (Exit)  134/74       Heart Rate (Admit)  95 bpm       Heart Rate (Exercise)  119 bpm       Heart Rate (Exit)  108 bpm       Oxygen Saturation (Admit)  95 %       Oxygen Saturation (Exercise)  83 %       Oxygen Saturation (Exit)  95 %       Rating of Perceived Exertion (Exercise)  17       Perceived Dyspnea (Exercise)  4       Symptoms  shortness of breath          Exercise Comments:   Exercise Goals and Review: Exercise Goals    Row Name 03/13/19 1711             Exercise Goals   Increase Physical Activity  Yes       Intervention  Provide advice, education, support and counseling about physical activity/exercise needs.;Develop an individualized exercise prescription for aerobic and resistive training based on initial evaluation findings, risk stratification, comorbidities and participant's personal goals.       Expected Outcomes  Short Term: Attend rehab on a regular basis to increase amount of physical activity.;Long Term: Add in home exercise to make exercise part of routine and to increase amount of physical activity.;Long Term: Exercising regularly at least 3-5 days a week.       Increase  Strength and Stamina  Yes       Intervention  Provide advice, education, support and counseling about physical activity/exercise needs.       Expected Outcomes  Short Term: Increase workloads from initial exercise prescription for resistance, speed, and METs.;Short Term: Perform resistance training exercises routinely during rehab and add in resistance training at home;Long Term: Improve cardiorespiratory fitness, muscular endurance and strength as measured by increased METs and functional capacity (6MWT)       Able to understand and use rate of perceived exertion (RPE) scale  Yes       Intervention  Provide education and explanation on how to use RPE scale       Expected Outcomes  Short Term: Able to use RPE daily in rehab to express subjective intensity level;Long Term:  Able to use RPE to guide intensity level when exercising independently       Able to understand and use Dyspnea scale  Yes       Intervention  Provide education and explanation on how to use Dyspnea scale       Expected Outcomes  Short Term: Able to use Dyspnea scale daily in rehab to express subjective sense of shortness of breath during exertion;Long Term: Able to use Dyspnea scale to guide intensity level when exercising independently       Knowledge and understanding of Target Heart Rate Range (  THRR)  Yes       Intervention  Provide education and explanation of THRR including how the numbers were predicted and where they are located for reference       Expected Outcomes  Short Term: Able to state/look up THRR;Short Term: Able to use daily as guideline for intensity in rehab;Long Term: Able to use THRR to govern intensity when exercising independently       Able to check pulse independently  Yes       Intervention  Provide education and demonstration on how to check pulse in carotid and radial arteries.;Review the importance of being able to check your own pulse for safety during independent exercise       Expected Outcomes  Short  Term: Able to explain why pulse checking is important during independent exercise;Long Term: Able to check pulse independently and accurately       Understanding of Exercise Prescription  Yes       Intervention  Provide education, explanation, and written materials on patient's individual exercise prescription       Expected Outcomes  Short Term: Able to explain program exercise prescription;Long Term: Able to explain home exercise prescription to exercise independently          Exercise Goals Re-Evaluation : Exercise Goals Re-Evaluation    Row Name 03/20/19 1532             Exercise Goal Re-Evaluation   Exercise Goals Review  Increase Physical Activity;Able to understand and use rate of perceived exertion (RPE) scale;Knowledge and understanding of Target Heart Rate Range (THRR);Understanding of Exercise Prescription;Increase Strength and Stamina;Able to understand and use Dyspnea scale;Able to check pulse independently       Comments  Reviewed RPE scale, THR and program prescription with pt today.  Pt voiced understanding and was given a copy of goals to take home.       Expected Outcomes  Short: Use RPE daily to regulate intensity. Long: Follow program prescription in THR.          Discharge Exercise Prescription (Final Exercise Prescription Changes): Exercise Prescription Changes - 03/13/19 1700      Response to Exercise   Blood Pressure (Admit)  122/64    Blood Pressure (Exercise)  146/66    Blood Pressure (Exit)  134/74    Heart Rate (Admit)  95 bpm    Heart Rate (Exercise)  119 bpm    Heart Rate (Exit)  108 bpm    Oxygen Saturation (Admit)  95 %    Oxygen Saturation (Exercise)  83 %    Oxygen Saturation (Exit)  95 %    Rating of Perceived Exertion (Exercise)  17    Perceived Dyspnea (Exercise)  4    Symptoms  shortness of breath       Nutrition:  Target Goals: Understanding of nutrition guidelines, daily intake of sodium <1586m, cholesterol <2063m calories 30% from  fat and 7% or less from saturated fats, daily to have 5 or more servings of fruits and vegetables.  Biometrics: Pre Biometrics - 03/13/19 1704      Pre Biometrics   Height  5' 9.5" (1.765 m)    Weight  120 lb 9.6 oz (54.7 kg)    BMI (Calculated)  17.56        Nutrition Therapy Plan and Nutrition Goals:   Nutrition Assessments:   Nutrition Goals Re-Evaluation:   Nutrition Goals Discharge (Final Nutrition Goals Re-Evaluation):   Psychosocial: Target Goals: Acknowledge presence or absence of  significant depression and/or stress, maximize coping skills, provide positive support system. Participant is able to verbalize types and ability to use techniques and skills needed for reducing stress and depression.   Initial Review & Psychosocial Screening: Initial Psych Review & Screening - 03/09/19 1336      Initial Review   Current issues with  Current Psychotropic Meds;Current Stress Concerns    Source of Stress Concerns  Chronic Illness;Family    Comments  Patient has lost his brother to cancer. His father inlaw has cancer. His ex wife and he has COPD. His family has alot of health issues.      Family Dynamics   Good Support System?  Yes    Comments  He can look to his ex wife for support.      Barriers   Psychosocial barriers to participate in program  The patient should benefit from training in stress management and relaxation.;Psychosocial barriers identified (see note)      Screening Interventions   Interventions  Encouraged to exercise;To provide support and resources with identified psychosocial needs;Provide feedback about the scores to participant    Expected Outcomes  Short Term goal: Utilizing psychosocial counselor, staff and physician to assist with identification of specific Stressors or current issues interfering with healing process. Setting desired goal for each stressor or current issue identified.;Long Term Goal: Stressors or current issues are controlled or  eliminated.;Short Term goal: Identification and review with participant of any Quality of Life or Depression concerns found by scoring the questionnaire.;Long Term goal: The participant improves quality of Life and PHQ9 Scores as seen by post scores and/or verbalization of changes       Quality of Life Scores:  Scores of 19 and below usually indicate a poorer quality of life in these areas.  A difference of  2-3 points is a clinically meaningful difference.  A difference of 2-3 points in the total score of the Quality of Life Index has been associated with significant improvement in overall quality of life, self-image, physical symptoms, and general health in studies assessing change in quality of life.  PHQ-9: Recent Review Flowsheet Data    Depression screen [facility] 2/9 03/13/2019 02/20/2019 09/20/2018 08/09/2018 07/19/2018   Decreased Interest 0 '1 3 1 1   ' Down, Depressed, Hopeless '3 1 3 1 1   ' PHQ - 2 Score '3 2 6 2 2   ' Altered sleeping '2 3 1 2 3   ' Tired, decreased energy '3 3 3 3 3   ' Change in appetite '3 2 1 3 3    ' Feeling bad or failure about yourself  '3 2 2 2 2   ' Trouble concentrating '3 3 1 ' 0 0   Moving slowly or fidgety/restless 2 1 0 0 3   Suicidal thoughts 0 0 0 0 0   PHQ-9 Score '19 16 14 12 16   ' Difficult doing work/chores Very difficult Extremely dIfficult - Somewhat difficult Somewhat difficult     Interpretation of Total Score  Total Score Depression Severity:  1-4 = Minimal depression, 5-9 = Mild depression, 10-14 = Moderate depression, 15-19 = Moderately severe depression, 20-27 = Severe depression   Psychosocial Evaluation and Intervention:   Psychosocial Re-Evaluation:   Psychosocial Discharge (Final Psychosocial Re-Evaluation):   Education: Education Goals: Education classes will be provided on a weekly basis, covering required topics. Participant will state understanding/return demonstration of topics presented.  Learning Barriers/Preferences: Learning  Barriers/Preferences - 03/09/19 1334      Learning Barriers/Preferences   Learning Barriers  None    Learning Preferences  Pictoral;Skilled Demonstration       Education Topics:  Initial Evaluation Education: - Verbal, written and demonstration of respiratory meds, oximetry and breathing techniques. Instruction on use of nebulizers and MDIs and importance of monitoring MDI activations.   Pulmonary Rehab from 03/09/2019 in [facility] Cardiac and Pulmonary Rehab  Date  03/09/19  Educator  [facility]  Instruction Review Code  1- Verbalizes Understanding      General Nutrition Guidelines/Fats and Fiber: -Group instruction provided by verbal, written material, models and posters to present the general guidelines for heart healthy nutrition. Gives an explanation and review of dietary fats and fiber.   Controlling Sodium/Reading Food Labels: -Group verbal and written material supporting the discussion of sodium use in heart healthy nutrition. Review and explanation with models, verbal and written materials for utilization of the food label.   Exercise Physiology & General Exercise Guidelines: - Group verbal and written instruction with models to review the exercise physiology of the cardiovascular system and associated critical values. Provides general exercise guidelines with specific guidelines to those with heart or lung disease.    Aerobic Exercise & Resistance Training: - Gives group verbal and written instruction on the various components of exercise. Focuses on aerobic and resistive training programs and the benefits of this training and how to safely progress through these programs.   Flexibility, Balance, Mind/Body Relaxation: Provides group verbal/written instruction on the benefits of flexibility and balance training, including mind/body exercise modes such as yoga, pilates and tai chi.  Demonstration and skill practice provided.   Stress and Anxiety: - Provides group verbal and written  instruction about the health risks of elevated stress and causes of high stress.  Discuss the correlation between heart/lung disease and anxiety and treatment options. Review healthy ways to manage with stress and anxiety.   Depression: - Provides group verbal and written instruction on the correlation between heart/lung disease and depressed mood, treatment options, and the stigmas associated with seeking treatment.   Exercise & Equipment Safety: - Individual verbal instruction and demonstration of equipment use and safety with use of the equipment.   Pulmonary Rehab from 03/13/2019 in [facility] Cardiac and Pulmonary Rehab  Date  03/13/19  Educator  AS  Instruction Review Code  1- Verbalizes Understanding      Infection Prevention: - Provides verbal and written material to individual with discussion of infection control including proper hand washing and proper equipment cleaning during exercise session.   Pulmonary Rehab from 03/13/2019 in [facility] Cardiac and Pulmonary Rehab  Date  03/13/19  Educator  AS  Instruction Review Code  1- Verbalizes Understanding      Falls Prevention: - Provides verbal and written material to individual with discussion of falls prevention and safety.   Pulmonary Rehab from 03/13/2019 in [facility] Cardiac and Pulmonary Rehab  Date  03/13/19  Educator  AS  Instruction Review Code  1- Verbalizes Understanding      Diabetes: - Individual verbal and written instruction to review signs/symptoms of diabetes, desired ranges of glucose level fasting, after meals and with exercise. Advice that pre and post exercise glucose checks will be done for 3 sessions at entry of program.   Chronic Lung Diseases: - Group verbal and written instruction to review updates, respiratory medications, advancements in procedures and treatments. Discuss use of supplemental oxygen including available portable oxygen systems, continuous and intermittent flow rates, concentrators, personal use  and safety guidelines. Review proper use of inhaler and spacers. Provide informative  websites for self-education.    Energy Conservation: - Provide group verbal and written instruction for methods to conserve energy, plan and organize activities. Instruct on pacing techniques, use of adaptive equipment and posture/positioning to relieve shortness of breath.   Triggers and Exacerbations: - Group verbal and written instruction to review types of environmental triggers and ways to prevent exacerbations. Discuss weather changes, air quality and the benefits of nasal washing. Review warning signs and symptoms to help prevent infections. Discuss techniques for effective airway clearance, coughing, and vibrations.   AED/CPR: - Group verbal and written instruction with the use of models to demonstrate the basic use of the AED with the basic ABC's of resuscitation.   Anatomy and Physiology of the Lungs: - Group verbal and written instruction with the use of models to provide basic lung anatomy and physiology related to function, structure and complications of lung disease.   Anatomy & Physiology of the Heart: - Group verbal and written instruction and models provide basic cardiac anatomy and physiology, with the coronary electrical and arterial systems. Review of Valvular disease and Heart Failure   Cardiac Medications: - Group verbal and written instruction to review commonly prescribed medications for heart disease. Reviews the medication, class of the drug, and side effects.   Know Your Numbers and Risk Factors: -Group verbal and written instruction about important numbers in your health.  Discussion of what are risk factors and how they play a role in the disease process.  Review of Cholesterol, Blood Pressure, Diabetes, and BMI and the role they play in your overall health.   Sleep Hygiene: -Provides group verbal and written instruction about how sleep can affect your health.  Define  sleep hygiene, discuss sleep cycles and impact of sleep habits. Review good sleep hygiene tips.    Other: -Provides group and verbal instruction on various topics (see comments)    Knowledge Questionnaire Score: Knowledge Questionnaire Score - 03/13/19 1707      Knowledge Questionnaire Score   Pre Score  9/18        Core Components/Risk Factors/Patient Goals at Admission: Personal Goals and Risk Factors at Admission - 03/13/19 1710      Core Components/Risk Factors/Patient Goals on Admission    Weight Management  Yes;Weight Gain    Intervention  Weight Management: Develop a combined nutrition and exercise program designed to reach desired caloric intake, while maintaining appropriate intake of nutrient and fiber, sodium and fats, and appropriate energy expenditure required for the weight goal.;Weight Management: Provide education and appropriate resources to help participant work on and attain dietary goals.;Weight Management/Obesity: Establish reasonable short term and long term weight goals.    Admit Weight  120 lb 9.6 oz (54.7 kg)    Goal Weight: Short Term  125 lb (56.7 kg)    Goal Weight: Long Term  130 lb (59 kg)    Expected Outcomes  Short Term: Continue to assess and modify interventions until short term weight is achieved;Long Term: Adherence to nutrition and physical activity/exercise program aimed toward attainment of established weight goal;Understanding recommendations for meals to include 15-35% energy as protein, 25-35% energy from fat, 35-60% energy from carbohydrates, less than 248m of dietary cholesterol, 20-35 gm of total fiber daily;Weight Gain: Understanding of general recommendations for a high calorie, high protein meal plan that promotes weight gain by distributing calorie intake throughout the day with the consumption for 4-5 meals, snacks, and/or supplements;Understanding of distribution of calorie intake throughout the day with the consumption of 4-5 meals/snacks  Tobacco Cessation  Yes    Number of packs per day  1 pack per day    Intervention  Assist the participant in steps to quit. Provide individualized education and counseling about committing to Tobacco Cessation, relapse prevention, and pharmacological support that can be provided by physician.;Advice worker, assist with locating and accessing local/national Quit Smoking programs, and support quit date choice.    Expected Outcomes  Short Term: Will demonstrate readiness to quit, by selecting a quit date.;Short Term: Will quit all tobacco product use, adhering to prevention of relapse plan.;Long Term: Complete abstinence from all tobacco products for at least 12 months from quit date.    Improve shortness of breath with ADL's  Yes    Intervention  Provide education, individualized exercise plan and daily activity instruction to help decrease symptoms of SOB with activities of daily living.    Expected Outcomes  Short Term: Improve cardiorespiratory fitness to achieve a reduction of symptoms when performing ADLs;Long Term: Be able to perform more ADLs without symptoms or delay the onset of symptoms       Core Components/Risk Factors/Patient Goals Review:    Core Components/Risk Factors/Patient Goals at Discharge (Final Review):    ITP Comments: ITP Comments    Row Name 03/09/19 1353 03/13/19 1705 03/20/19 1531 03/22/19 0656     ITP Comments  Virtual Orientation performed. Patient informed when to come in for RD and EP orientation. Diagnosis can be found in [facility] 02/20/2019.  Initial 6MW test complete ITP created and sent to Dr Sabra Heck  First full day of exercise!  Patient was oriented to gym and equipment including functions, settings, policies, and procedures.  Patient's individual exercise prescription and treatment plan were reviewed.  All starting workloads were established based on the results of the 6 minute walk test done at initial orientation visit.  The plan for exercise  progression was also introduced and progression will be customized based on patient's performance and goals.  30 day review completed. Continue with ITP sent to Dr. Emily Filbert, Medical Director of Cardiac and Pulmonary Rehab for review , changes as needed and signature.  New to program       Comments:

## 2019-03-23 ENCOUNTER — Telehealth: Payer: Self-pay | Admitting: Nurse Practitioner

## 2019-03-23 ENCOUNTER — Other Ambulatory Visit: Payer: Self-pay | Admitting: Nurse Practitioner

## 2019-03-23 ENCOUNTER — Other Ambulatory Visit: Payer: Self-pay

## 2019-03-23 IMAGING — CR DG SHOULDER 2+V*L*
3 series · 3 of 3 positions shown · non-contrast
Comparison: None.

CLINICAL DATA: LEFT shoulder pain for 3 weeks.  No known injury.

EXAM:
LEFT SHOULDER - 2+ VIEW

[shoulder grashey]
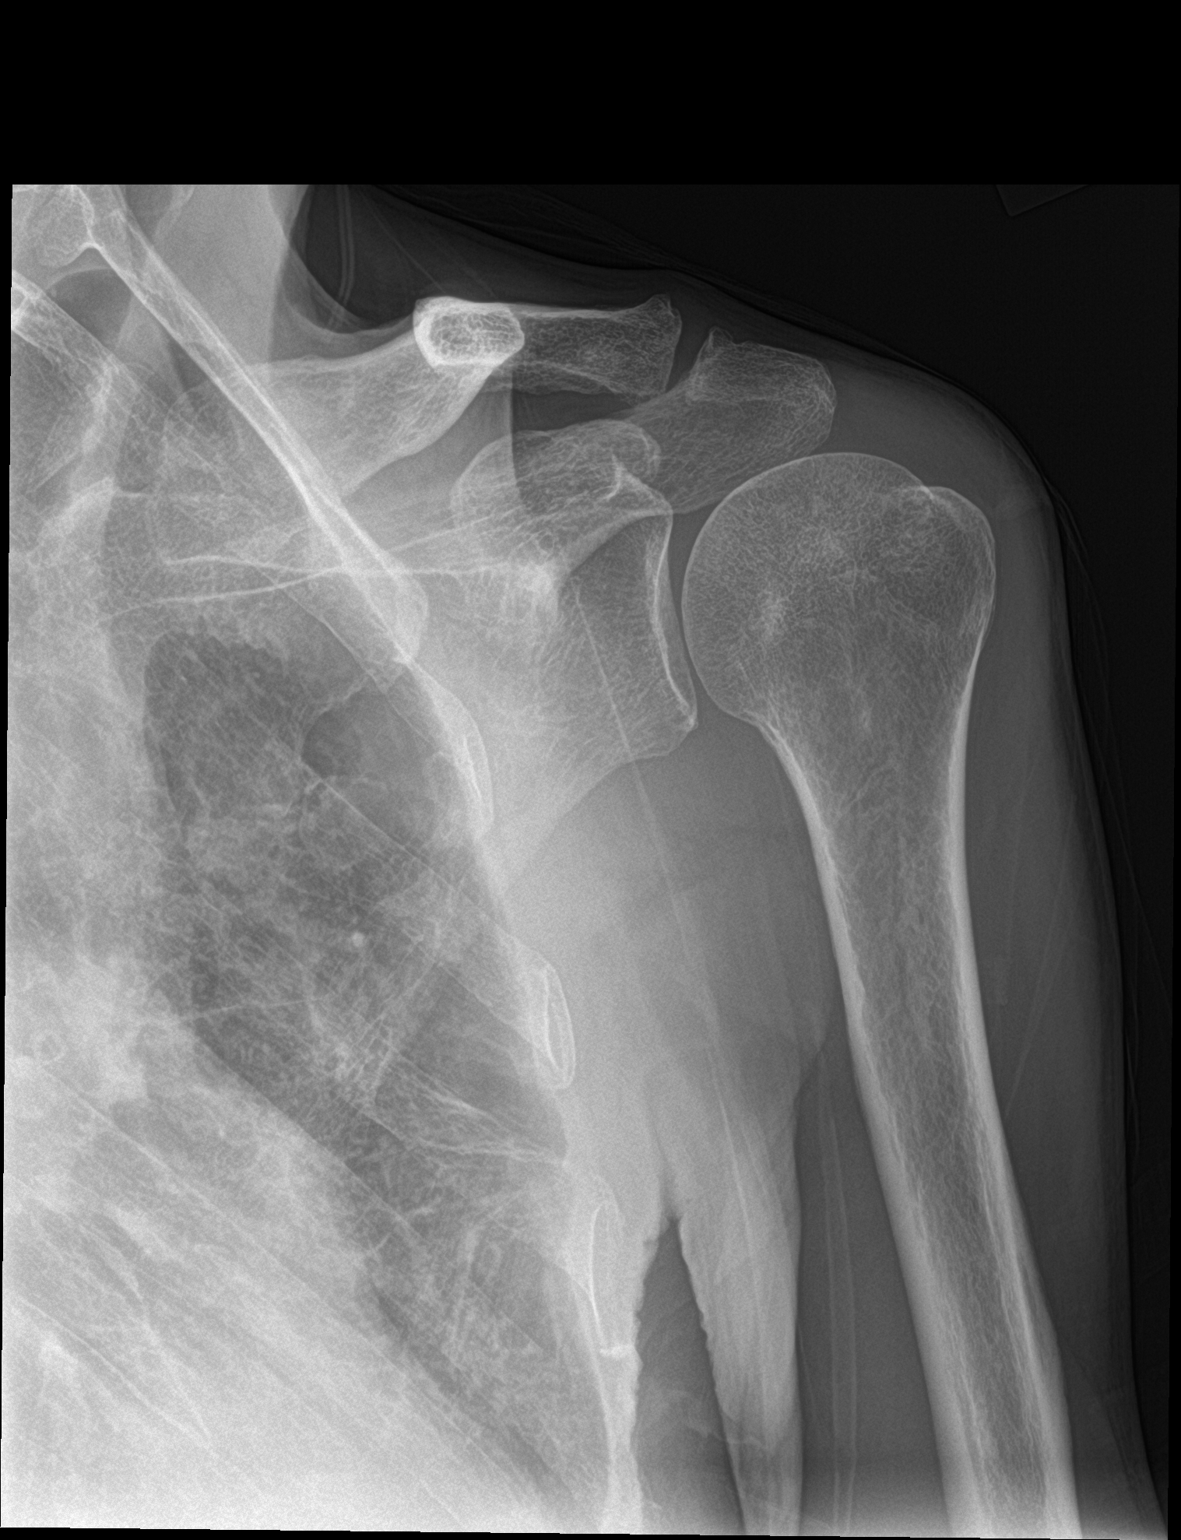

[shoulder axillary]
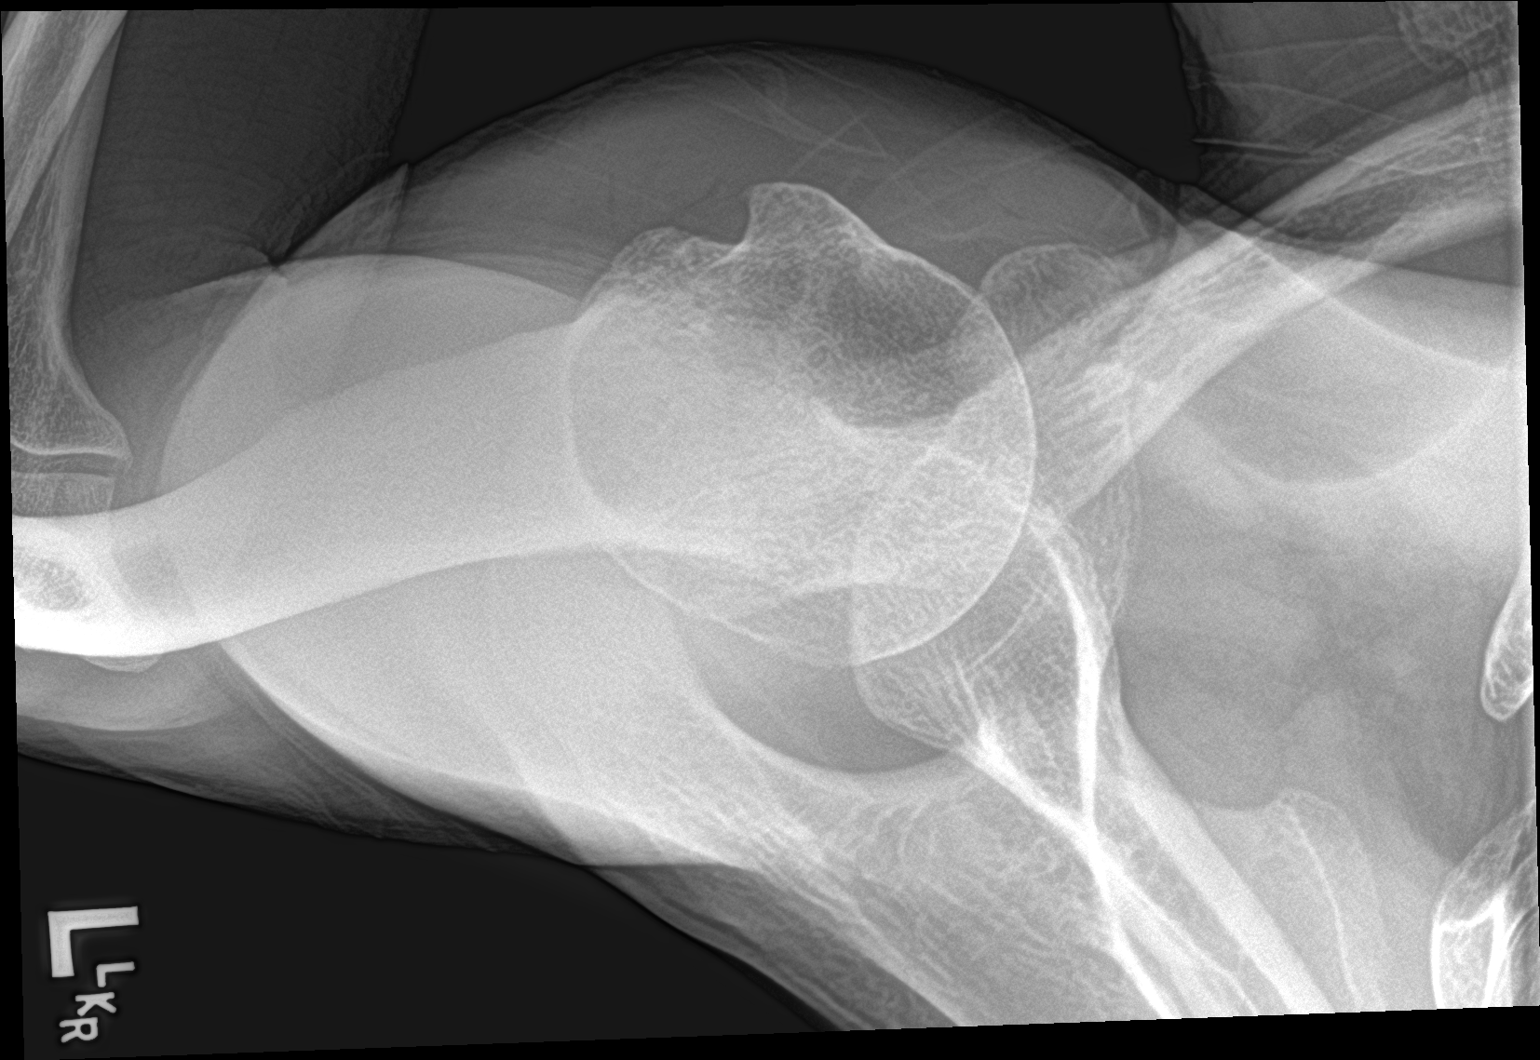

[shoulder y view]
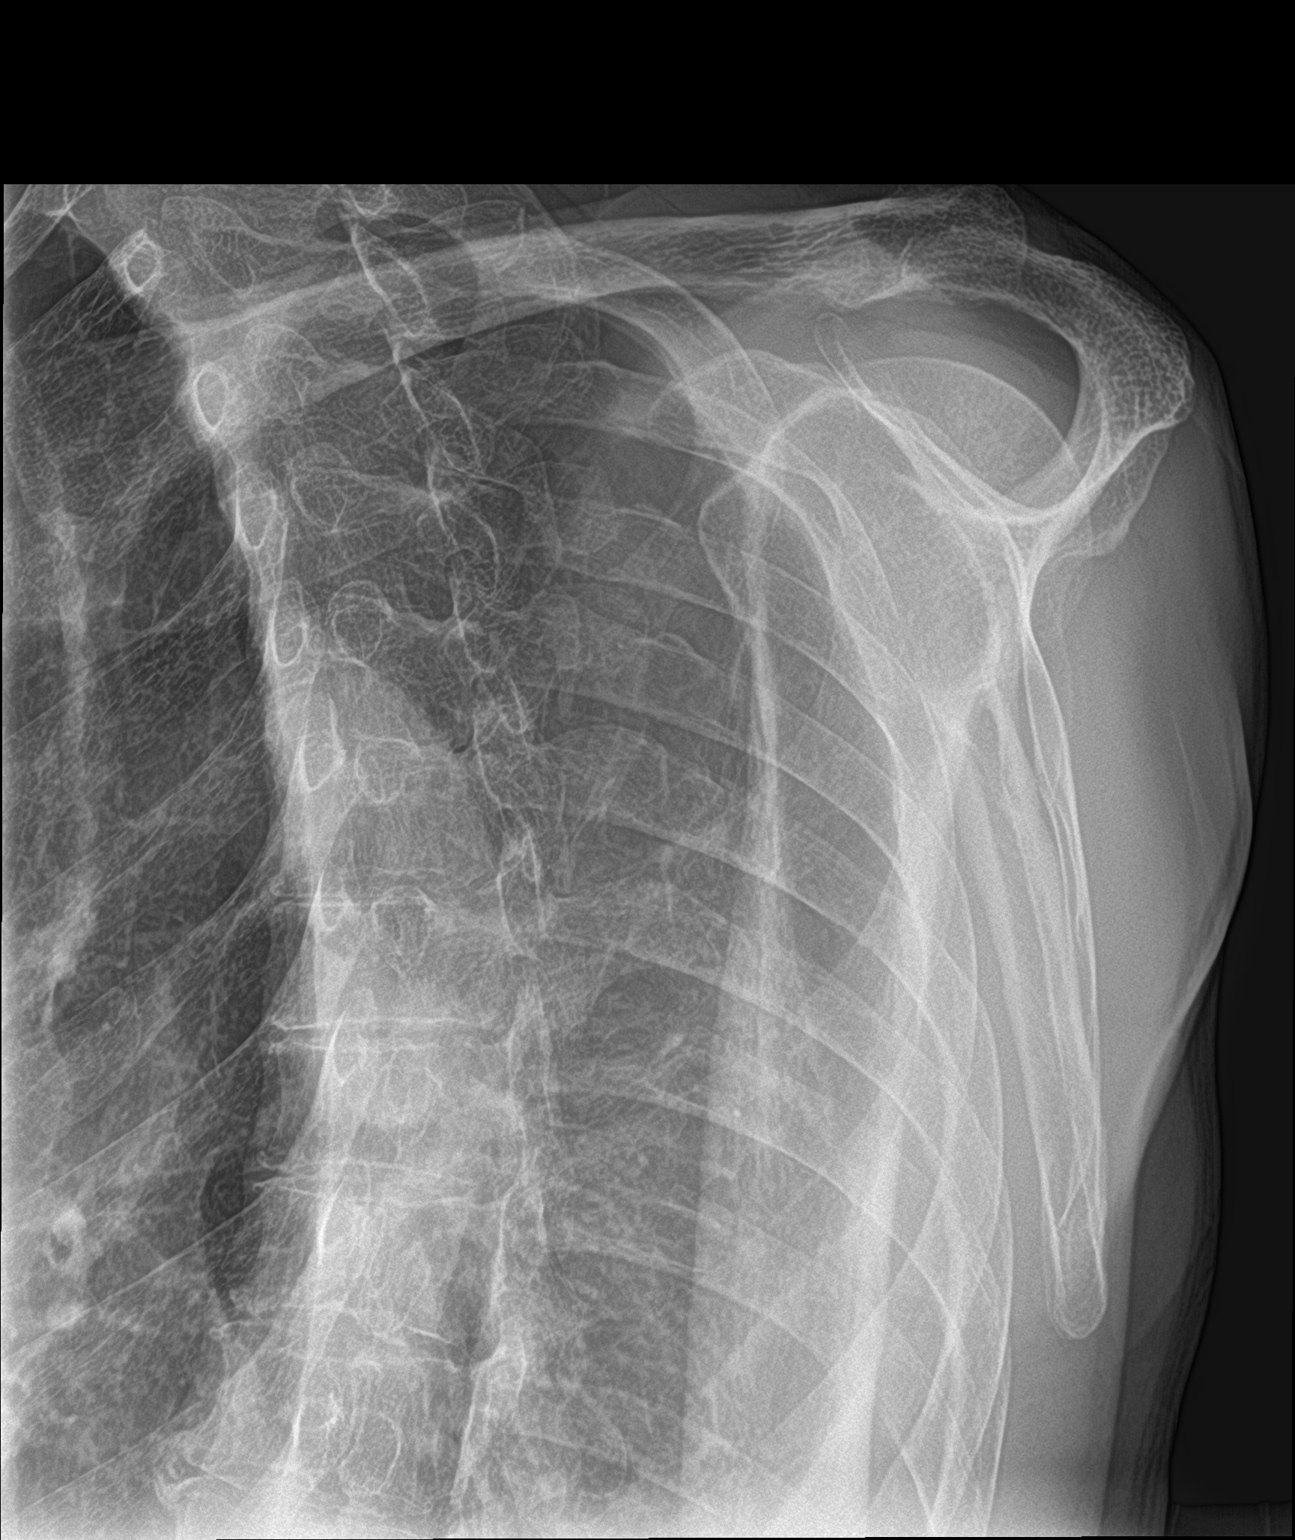

[3 of 3 positions shown; findings below may reference images not displayed]

FINDINGS: There is no evidence of fracture or dislocation. There is no
evidence of arthropathy or other focal bone abnormality. Soft
tissues are unremarkable.
IMPRESSION: Negative.

## 2019-03-23 NOTE — Telephone Encounter (Signed)
Pt stated he would like to do virtual Instead of in office.

## 2019-03-23 NOTE — Telephone Encounter (Signed)
I called PT. and could not even ask him the covid screening questions due to the cough attack he had he said he has been coughing  like this for years but I am unsure if you would like me to try to get him to do virtual or is this his normal cough?Please advise.

## 2019-03-23 NOTE — Telephone Encounter (Signed)
This is his baseline with his severe COPD.  He can come in office.

## 2019-03-23 NOTE — Telephone Encounter (Signed)
That would be fine.  Thank you

## 2019-03-24 ENCOUNTER — Ambulatory Visit: Payer: Self-pay

## 2019-03-24 ENCOUNTER — Other Ambulatory Visit: Payer: Self-pay

## 2019-03-24 ENCOUNTER — Ambulatory Visit (INDEPENDENT_AMBULATORY_CARE_PROVIDER_SITE_OTHER): Payer: Medicaid Other | Admitting: Nurse Practitioner

## 2019-03-24 ENCOUNTER — Encounter: Payer: Self-pay | Admitting: Nurse Practitioner

## 2019-03-24 ENCOUNTER — Ambulatory Visit: Payer: Self-pay | Admitting: Licensed Clinical Social Worker

## 2019-03-24 DIAGNOSIS — F331 Major depressive disorder, recurrent, moderate: Secondary | ICD-10-CM | POA: Diagnosis not present

## 2019-03-24 MED ORDER — SERTRALINE HCL 100 MG PO TABS: 100.0000 mg | ORAL_TABLET | Freq: Every day | ORAL | 3 refills | Status: DC

## 2019-03-24 NOTE — Assessment & Plan Note (Signed)
Chronic, ongoing with recent exacerbation due to grieving.  Will increase Sertraline to 100 MG daily, script sent, and follow-up in 4 weeks.  Denies SI/HI.  Will also reach out to CCM SW for assistance in obtaining therapy time, which may benefit patient during this time of grieving.

## 2019-03-24 NOTE — Progress Notes (Signed)
Ht 5\' 9"  (1.753 m)   Wt 120 lb (54.4 kg)   BMI 17.72 kg/m    Subjective:    Patient ID: Austin Blevins, male    DOB: [birth year], 58 y.o.   MRN: [id number]  HPI: Austin Blevins is a 58 y.o. male  Chief Complaint  Patient presents with  . Depression    . This visit was completed via telephone due to the restrictions of the COVID-19 pandemic. All issues as above were discussed and addressed but no physical exam was performed. If it was felt that the patient should be evaluated in the office, they were directed there. The patient verbally consented to this visit. Patient was unable to complete an audio/visual visit due to telephone only. Due to the catastrophic nature of the COVID-19 pandemic, this visit was done through audio contact only. . Location of the patient: home . Location of the provider: work . Those involved with this call:  . Provider: 59, DNP . CMA: Aura Dials, CMA . Front Desk/Registration: Wilhemena Durie  . Time spent on call: 15 minutes on the phone discussing health concerns. 10 minutes total spent in review of patient's record and preparation of their chart.  . I verified patient identity using two factors (patient name and date of birth). Patient consents verbally to being seen via telemedicine visit today.    DEPRESSION Was increased to 50 MG Sertraline, reports some benefit but not 100% as mood recently exacerbated by grieivng.  He did recently lose his brother-in-law who was a good friend to him. + his sister has been sick.  This has exacerbated mood.    Mood status: exacerbated Satisfied with current treatment?: yes Symptom severity: mild  Duration of current treatment : chronic Side effects: no Medication compliance: good compliance Psychotherapy/counseling: none Previous psychiatric medications: Sertraline Depressed mood: yes Anxious mood: no Anhedonia: no Significant weight loss or gain: no Insomnia: yes hard to fall asleep Fatigue:  no Feelings of worthlessness or guilt: no Impaired concentration/indecisiveness: yes Suicidal ideations: no Hopelessness: no Crying spells: no Depression screen [facility] 2/9 03/24/2019 03/13/2019 02/20/2019 09/20/2018 08/09/2018  Decreased Interest 3 0 1 3 1   Down, Depressed, Hopeless 2 3 1 3 1   PHQ - 2 Score 5 3 2 6 2   Altered sleeping 3 2 3 1 2   Tired, decreased energy 3 3 3 3 3   Change in appetite 1 3 2 1 3   Feeling bad or failure about yourself  1 3 2 2 2   Trouble concentrating 3 3 3 1  0  Moving slowly or fidgety/restless 2 2 1  0 0  Suicidal thoughts 1 0 0 0 0  PHQ-9 Score 19 19 16 14 12   Difficult doing work/chores Somewhat difficult Very difficult Extremely dIfficult - Somewhat difficult    Relevant past medical, surgical, family and social history reviewed and updated as indicated. Interim medical history since our last visit reviewed. Allergies and medications reviewed and updated.  Review of Systems  Constitutional: Negative for activity change, diaphoresis, fatigue and fever.  Respiratory: Negative for cough, chest tightness, shortness of breath and wheezing.   Cardiovascular: Negative for chest pain, palpitations and leg swelling.  Gastrointestinal: Negative for abdominal distention, abdominal pain, constipation, diarrhea, nausea and vomiting.  Endocrine: Negative for cold intolerance, heat intolerance, polydipsia, polyphagia and polyuria.  Musculoskeletal: Negative.   Skin: Negative.   Neurological: Negative for dizziness, syncope, weakness, light-headedness, numbness and headaches.  Psychiatric/Behavioral: Negative.     Per HPI unless specifically indicated  above     Objective:    Ht 5\' 9"  (1.753 m)   Wt 120 lb (54.4 kg)   BMI 17.72 kg/m   Wt Readings from Last 3 Encounters:  03/24/19 120 lb (54.4 kg)  03/13/19 120 lb 9.6 oz (54.7 kg)  10/24/18 120 lb (54.4 kg)    Physical Exam   Unable to perform due to telephone visit only.  Results for orders placed or  performed in visit on 01/31/19  Novel Coronavirus, NAA ([facility])   Specimen: Oropharyngeal(OP) collection in vial transport medium   OROPHARYNGEA  TESTING  Result Value Ref Range   SARS-CoV-2, NAA Not Detected Not Detected      Assessment & Plan:   Problem List Items Addressed This Visit      Other   Depression, major, recurrent, moderate ([place])    Chronic, ongoing with recent exacerbation due to grieving.  Will increase Sertraline to 100 MG daily, script sent, and follow-up in 4 weeks.  Denies SI/HI.  Will also reach out to CCM SW for assistance in obtaining therapy time, which may benefit patient during this time of grieving.      Relevant Medications   sertraline (ZOLOFT) 100 MG tablet       Follow up plan: Return in about 4 weeks (around 04/21/2019) for COPD and Mood.

## 2019-03-24 NOTE — Chronic Care Management (AMB) (Signed)
  Care Management   Follow Up Note   03/24/2019 Name: Austin Blevins MRN: [id number] DOB: 08-Apr-1961  Referred by: [name], NP Reason for referral : Care Coordination   Austin Blevins is a 58 y.o. year old male who is a primary care patient of [name], [name], NP. The care management team was consulted for assistance with care management and care coordination needs.    Review of patient status, including review of consultants reports, relevant laboratory and other test results, and collaboration with appropriate care team members and the patient's provider was performed as part of comprehensive patient evaluation and provision of chronic care management services.    CCM LCSW received referral from PCP. PCP ask if LCSW could reach out to [facility] to offer some therapy time and resource connection for virtual therapy that would take Medicaid.This patient would benefit from some therapy time with recent grieving and mood. He is followed by palliative too, but reports he has not heard from them since beginning of October. LCSW will contact patient on 03/28/2019.  The care management team will reach out to the patient again over the next 5 days.   Eula Fried, BSW, MSW, [facility] Care Management [email or web address] Phone: [phone]

## 2019-03-24 NOTE — Patient Instructions (Signed)
Living With Depression Everyone experiences occasional disappointment, sadness, and loss in their lives. When you are feeling down, blue, or sad for at least 2 weeks in a row, it may mean that you have depression. Depression can affect your thoughts and feelings, relationships, daily activities, and physical health. It is caused by changes in the way your brain functions. If you receive a diagnosis of depression, your health care provider will tell you which type of depression you have and what treatment options are available to you. If you are living with depression, there are ways to help you recover from it and also ways to prevent it from coming back. How to cope with lifestyle changes Coping with stress     Stress is your body's reaction to life changes and events, both good and bad. Stressful situations may include:  Getting married.  The death of a spouse.  Losing a job.  Retiring.  Having a baby. Stress can last just a few hours or it can be ongoing. Stress can play a major role in depression, so it is important to learn both how to cope with stress and how to think about it differently. Talk with your health care provider or a counselor if you would like to learn more about stress reduction. He or she may suggest some stress reduction techniques, such as:  Music therapy. This can include creating music or listening to music. Choose music that you enjoy and that inspires you.  Mindfulness-based meditation. This kind of meditation can be done while sitting or walking. It involves being aware of your normal breaths, rather than trying to control your breathing.  Centering prayer. This is a kind of meditation that involves focusing on a spiritual word or phrase. Choose a word, phrase, or sacred image that is meaningful to you and that brings you peace.  Deep breathing. To do this, expand your stomach and inhale slowly through your nose. Hold your breath for 3-5 seconds, then exhale  slowly, allowing your stomach muscles to relax.  Muscle relaxation. This involves intentionally tensing muscles then relaxing them. Choose a stress reduction technique that fits your lifestyle and personality. Stress reduction techniques take time and practice to develop. Set aside 5-15 minutes a day to do them. Therapists can offer training in these techniques. The training may be covered by some insurance plans. Other things you can do to manage stress include:  Keeping a stress diary. This can help you learn what triggers your stress and ways to control your response.  Understanding what your limits are and saying no to requests or events that lead to a schedule that is too full.  Thinking about how you respond to certain situations. You may not be able to control everything, but you can control how you react.  Adding humor to your life by watching funny films or TV shows.  Making time for activities that help you relax and not feeling guilty about spending your time this way.  Medicines Your health care provider may suggest certain medicines if he or she feels that they will help improve your condition. Avoid using alcohol and other substances that may prevent your medicines from working properly (may interact). It is also important to:  Talk with your pharmacist or health care provider about all the medicines that you take, their possible side effects, and what medicines are safe to take together.  Make it your goal to take part in all treatment decisions (shared decision-making). This includes giving input on   the side effects of medicines. It is best if shared decision-making with your health care provider is part of your total treatment plan. If your health care provider prescribes a medicine, you may not notice the full benefits of it for 4-8 weeks. Most people who are treated for depression need to be on medicine for at least 6-12 months after they feel better. If you are taking  medicines as part of your treatment, do not stop taking medicines without first talking to your health care provider. You may need to have the medicine slowly decreased (tapered) over time to decrease the risk of harmful side effects. Relationships Your health care provider may suggest family therapy along with individual therapy and drug therapy. While there may not be family problems that are causing you to feel depressed, it is still important to make sure your family learns as much as they can about your mental health. Having your family's support can help make your treatment successful. How to recognize changes in your condition Everyone has a different response to treatment for depression. Recovery from major depression happens when you have not had signs of major depression for two months. This may mean that you will start to:  Have more interest in doing activities.  Feel less hopeless than you did 2 months ago.  Have more energy.  Overeat less often, or have better or improving appetite.  Have better concentration. Your health care provider will work with you to decide the next steps in your recovery. It is also important to recognize when your condition is getting worse. Watch for these signs:  Having fatigue or low energy.  Eating too much or too little.  Sleeping too much or too little.  Feeling restless, agitated, or hopeless.  Having trouble concentrating or making decisions.  Having unexplained physical complaints.  Feeling irritable, angry, or aggressive. Get help as soon as you or your family members notice these symptoms coming back. How to get support and help from others How to talk with friends and family members about your condition  Talking to friends and family members about your condition can provide you with one way to get support and guidance. Reach out to trusted friends or family members, explain your symptoms to them, and let them know that you are  working with a health care provider to treat your depression. Financial resources Not all insurance plans cover mental health care, so it is important to check with your insurance carrier. If paying for co-pays or counseling services is a problem, search for a local or county mental health care center. They may be able to offer public mental health care services at low or no cost when you are not able to see a private health care provider. If you are taking medicine for depression, you may be able to get the generic form, which may be less expensive. Some makers of prescription medicines also offer help to patients who cannot afford the medicines they need. Follow these instructions at home:   Get the right amount and quality of sleep.  Cut down on using caffeine, tobacco, alcohol, and other potentially harmful substances.  Try to exercise, such as walking or lifting small weights.  Take over-the-counter and prescription medicines only as told by your health care provider.  Eat a healthy diet that includes plenty of vegetables, fruits, whole grains, low-fat dairy products, and lean protein. Do not eat a lot of foods that are high in solid fats, added sugars, or salt.    Keep all follow-up visits as told by your health care provider. This is important. Contact a health care provider if:  You stop taking your antidepressant medicines, and you have any of these symptoms: ? Nausea. ? Headache. ? Feeling lightheaded. ? Chills and body aches. ? Not being able to sleep (insomnia).  You or your friends and family think your depression is getting worse. Get help right away if:  You have thoughts of hurting yourself or others. If you ever feel like you may hurt yourself or others, or have thoughts about taking your own life, get help right away. You can go to your nearest emergency department or call:  Your local emergency services (911 in the [place]  A suicide crisis helpline, such as the  National Suicide Prevention Lifeline at 1-800-273-8255. This is open 24-hours a day. Summary  If you are living with depression, there are ways to help you recover from it and also ways to prevent it from coming back.  Work with your health care team to create a management plan that includes counseling, stress management techniques, and healthy lifestyle habits. This information is not intended to replace advice given to you by your health care provider. Make sure you discuss any questions you have with your health care provider. Document Released: 04/06/2016 Document Revised: 08/26/2018 Document Reviewed: 04/06/2016 Elsevier Patient Education  2020 [employer]vier Inc.  

## 2019-03-28 ENCOUNTER — Ambulatory Visit: Payer: Self-pay | Admitting: Licensed Clinical Social Worker

## 2019-03-28 DIAGNOSIS — F4321 Adjustment disorder with depressed mood: Secondary | ICD-10-CM

## 2019-03-28 DIAGNOSIS — F331 Major depressive disorder, recurrent, moderate: Secondary | ICD-10-CM

## 2019-03-28 NOTE — Chronic Care Management (AMB) (Signed)
  Care Management   Follow Up Note   03/28/2019 Name: Austin Blevins MRN: [id number] DOB: 04/06/1961  Referred by: [name], NP Reason for referral : Care Coordination   Austin Blevins is a 58 y.o. year old male who is a primary care patient of [name], [name], NP. The care management team was consulted for assistance with care management and care coordination needs.    Review of patient status, including review of consultants reports, relevant laboratory and other test results, and collaboration with appropriate care team members and the patient's provider was performed as part of comprehensive patient evaluation and provision of chronic care management services.    SDOH (Social Determinants of Health) screening performed today: Depression  . See Care Plan for related entries.   Advanced Directives: See Care Plan and Vynca application for related entries.   Goals Addressed    . SW- "I need support with my grief and depression" (pt-stated)       Current Barriers:  . Chronic Mental Health needs related to depression and grief (recent loss of brother in law) . Limited social support . Mental Health Concerns  . Social Isolation . Limited access to caregiver . Suicidal Ideation/Homicidal Ideation: No  Clinical Social Work Goal(s):  [name] Kitchen Over the next 90 days, patient will work with SW bi-monthly by telephone or in person to reduce or manage symptoms related to ongoing grief and depression . Over the next 90 days, patient will work with SW to address concerns related to gaining long term mental health follow up  Interventions: . Patient interviewed and appropriate assessments performed: brief mental health assessment . Patient interviewed and appropriate assessments performed . Discussed plans with patient for ongoing care management follow up and provided patient with direct contact information for care management team . Advised patient to implement appropriate self-care coping  tools into his daily routine to combat depressive symptoms . Assisted patient/caregiver with obtaining information about health plan benefits . Provided education and assistance to client regarding Advanced Directives. . Provided education to patient/caregiver about Hospice and/or Palliative Care services . Referred patient to [facility] for therapy/counseling for grief. LCSW also provided education on available mental health support resources within the area. Patient agreeable to only [facility] referral at this time. . Grief Counseling provided during session . LCSW will route encounter to Palliative Care SW as well.  Patient Self Care Activities:  . Calls provider office for new concerns or questions  Patient Coping Strengths:  . Hopefulness . Self Advocate  Patient Self Care Deficits:  . Unable to independently manage mental health needs in the home. Mental health follow up encouraged and [facility] referral completed.  Initial goal documentation     The care management team will reach out to the patient again over the next 45 days.   Eula Fried, BSW, MSW, [facility] Care Management [email or web address] Phone: [phone]

## 2019-03-29 ENCOUNTER — Other Ambulatory Visit: Payer: Self-pay | Admitting: Specialist

## 2019-03-29 ENCOUNTER — Telehealth: Payer: Self-pay | Admitting: *Deleted

## 2019-03-29 DIAGNOSIS — R0609 Other forms of dyspnea: Secondary | ICD-10-CM

## 2019-03-29 DIAGNOSIS — R0789 Other chest pain: Secondary | ICD-10-CM

## 2019-03-29 DIAGNOSIS — R058 Other specified cough: Secondary | ICD-10-CM

## 2019-03-29 DIAGNOSIS — R0602 Shortness of breath: Secondary | ICD-10-CM

## 2019-03-29 DIAGNOSIS — R05 Cough: Secondary | ICD-10-CM

## 2019-03-29 DIAGNOSIS — J449 Chronic obstructive pulmonary disease, unspecified: Secondary | ICD-10-CM

## 2019-03-29 NOTE — Telephone Encounter (Signed)
Austin Blevins called to say he has some fluid on his lungs and that they want to do an MRI. He will let us know when he is stable to return.

## 2019-04-04 ENCOUNTER — Ambulatory Visit: Payer: Self-pay | Admitting: Pharmacist

## 2019-04-04 DIAGNOSIS — J449 Chronic obstructive pulmonary disease, unspecified: Secondary | ICD-10-CM

## 2019-04-04 DIAGNOSIS — F331 Major depressive disorder, recurrent, moderate: Secondary | ICD-10-CM

## 2019-04-04 NOTE — Chronic Care Management (AMB) (Signed)
Chronic Care Management   Follow Up Note   04/04/2019 Name: Austin Blevins MRN: [id number] DOB: Aug 24, 1960  Referred by: [name], NP Reason for referral : Chronic Care Management (Medication Management)   Austin Blevins is a 58 y.o. year old male who is a primary care patient of [name], [name], NP. The CCM team was consulted for assistance with chronic disease management and care coordination needs.    Contacted patient for medication management support  Review of patient status, including review of consultants reports, relevant laboratory and other test results, and collaboration with appropriate care team members and the patient's provider was performed as part of comprehensive patient evaluation and provision of chronic care management services.    SDOH (Social Determinants of Health) screening performed today: Depression  . See Care Plan for related entries.   Outpatient Encounter Medications as of 04/04/2019  Medication Sig Note  . Fluticasone-Salmeterol (ADVAIR DISKUS) 250-50 MCG/DOSE AEPB Inhale 1 puff into the lungs 2 (two) times daily.   . predniSONE (DELTASONE) 10 MG tablet Take 10 mg by mouth daily with breakfast.   . sertraline (ZOLOFT) 100 MG tablet Take 1 tablet (100 mg total) by mouth daily.   [name] Kitchen SPIRIVA HANDIHALER 18 MCG inhalation capsule INHALE CONTENTS OF 1 CAPSULE AS DIRECTEDEVERY DAY WITH HANDIHALER   . albuterol (PROVENTIL) (2.5 MG/3ML) 0.083% nebulizer solution USE ONE VIAL IN NEBULIZER EVERY SIX HOURS AS NEEDED FOR WHEEZING OR SHORTNESS OF BREATH   . albuterol (VENTOLIN HFA) 108 (90 Base) MCG/ACT inhaler INHALE 2 PUFFS INTO THE LUNGS EVERY 4 HOURS AS NEEDED FOR WHEEZING OR SHORTNESS OF BREATH   . omeprazole (PRILOSEC) 20 MG capsule TAKE 1 CAPSULE BY MOUTH EVERY DAY   . rOPINIRole (REQUIP) 0.5 MG tablet TAKE ONE TABLET BY MOUTH EVERY MORNING AND 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   . [DISCONTINUED] albuterol (PROVENTIL HFA;VENTOLIN  HFA) 108 (90 Base) MCG/ACT inhaler Inhale 2 puffs into the lungs every 4 (four) hours as needed for wheezing or shortness of breath. 12/13/2018: Using 4x day   No facility-administered encounter medications on file as of 04/04/2019.      Goals Addressed            This Visit's Progress     Patient Stated   . "I have a lot of medications" (pt-stated)       Current Barriers:  . Polypharmacy - complex patient with multiple chronic conditions including COPD, tobacco abuse, depression, RLS o COPD: follows w/ palliative care, pulmonology at [facility]. Notes he saw Dr. Meredeth Ide on 11/10, was recommended to have chest xray and follow up after that. Patient notes he was awaiting a call from the clinic to schedule his f/u. Continues Advair + Spiriva, albuterol HFA or neb PRN; prednisone 10 mg daily. Xray scheduled 11/23. Notes that he stopped attending pulmonary rehab due to not feeling well enough, d/t "fluid on my lungs" o Depression: recent exacerbation d/t family loss. Jolene [name] recently increased sertraline to 100 mg daily o RLS: ropinirole 0.5 mg QAM, 1.5 mg QPM o Tobacco abuse; notes that he is not interested in smoking cessation at this point.  Pharmacist Clinical Goal(s):  [name] Kitchen Over the next 90 days, patient will work with PharmD and PCP to address needs related to optmized medication management  Interventions: . Provided empathetic listening to patient's current concerns with his health . Contacted [facility]; requested they contact patient to schedule f/u s/p imaging.  . Counseled on onset  of benefit of increased sertraline dose.  . Will collaborate w/ CCM team for follow up  Patient Self Care Activities:  . Self administers medications as prescribed  Please see past updates related to this goal by clicking on the "Past Updates" button in the selected goal          Plan: - Will outreach patient in ~6-8 weeks for continued medication management support  Catie Darnelle Maffucci, PharmD  Clinical Pharmacist [place] [phone]

## 2019-04-04 NOTE — Patient Instructions (Signed)
Visit Information  Goals Addressed            This Visit's Progress     Patient Stated   . "I have a lot of medications" (pt-stated)       Current Barriers:  . Polypharmacy - complex patient with multiple chronic conditions including COPD, tobacco abuse, depression, RLS o COPD: follows w/ palliative care, pulmonology at [facility]. Notes he saw Dr. Raul Del on 11/10, was recommended to have chest xray and follow up after that. Patient notes he was awaiting a call from the clinic to schedule his f/u. Continues Advair + Spiriva, albuterol HFA or neb PRN; prednisone 10 mg daily. Xray scheduled 11/23. Notes that he stopped attending pulmonary rehab due to not feeling well enough, d/t "fluid on my lungs" o Depression: recent exacerbation d/t family loss. Jolene [name] recently increased sertraline to 100 mg daily o RLS: ropinirole 0.5 mg QAM, 1.5 mg QPM o Tobacco abuse; notes that he is not interested in smoking cessation at this point.  Pharmacist Clinical Goal(s):  [name] Kitchen Over the next 90 days, patient will work with PharmD and PCP to address needs related to optmized medication management  Interventions: . Provided empathetic listening to patient's current concerns with his health . Contacted [facility]; requested they contact patient to schedule f/u s/p imaging.  . Counseled on onset of benefit of increased sertraline dose.  . Will collaborate w/ CCM team for follow up  Patient Self Care Activities:  . Self administers medications as prescribed  Please see past updates related to this goal by clicking on the "Past Updates" button in the selected goal         The patient verbalized understanding of instructions provided today and declined a print copy of patient instruction materials.   Plan: - Will outreach patient in ~6-8 weeks for continued medication management support  Catie Darnelle Maffucci, PharmD Clinical Pharmacist [place] [phone]

## 2019-04-05 ENCOUNTER — Telehealth: Payer: Self-pay | Admitting: *Deleted

## 2019-04-05 ENCOUNTER — Encounter: Payer: Self-pay | Admitting: *Deleted

## 2019-04-05 DIAGNOSIS — J449 Chronic obstructive pulmonary disease, unspecified: Secondary | ICD-10-CM

## 2019-04-05 NOTE — Telephone Encounter (Signed)
Austin Blevins called to inform staff he has a scan of his lungs this coming Monday and an appointment on Wednesday to review the plan once results are in. Any physical activity causes him pain. He will call back to inform us of the plan as to when he can return to Pulmonary Rehab.

## 2019-04-07 ENCOUNTER — Telehealth: Payer: Self-pay

## 2019-04-10 ENCOUNTER — Ambulatory Visit
Admission: RE | Admit: 2019-04-10 | Discharge: 2019-04-10 | Disposition: A | Discharge status: Home / Self Care | Payer: Medicaid Other | Source: Ambulatory Visit | Attending: Specialist | Admitting: Specialist

## 2019-04-10 ENCOUNTER — Other Ambulatory Visit: Payer: Self-pay

## 2019-04-10 DIAGNOSIS — R0600 Dyspnea, unspecified: Secondary | ICD-10-CM | POA: Diagnosis present

## 2019-04-10 DIAGNOSIS — R0602 Shortness of breath: Secondary | ICD-10-CM | POA: Diagnosis present

## 2019-04-10 DIAGNOSIS — R0789 Other chest pain: Secondary | ICD-10-CM

## 2019-04-10 DIAGNOSIS — R058 Other specified cough: Secondary | ICD-10-CM

## 2019-04-10 DIAGNOSIS — J449 Chronic obstructive pulmonary disease, unspecified: Secondary | ICD-10-CM

## 2019-04-10 DIAGNOSIS — R05 Cough: Secondary | ICD-10-CM | POA: Insufficient documentation

## 2019-04-10 DIAGNOSIS — R0609 Other forms of dyspnea: Secondary | ICD-10-CM

## 2019-04-10 LAB — POCT I-STAT CREATININE: Creatinine, Ser: 1.2 mg/dL (ref 0.61–1.24)

## 2019-04-10 MED ORDER — IOHEXOL 300 MG/ML  SOLN
75.0000 mL | Freq: Once | INTRAMUSCULAR | Status: AC | PRN
  Administered 2019-04-10: 75 mL via INTRAVENOUS

## 2019-04-11 ENCOUNTER — Ambulatory Visit: Payer: Self-pay

## 2019-04-17 ENCOUNTER — Telehealth: Payer: Self-pay

## 2019-04-17 NOTE — Telephone Encounter (Signed)
Austin Blevins has pneumonia and cannot attend LW this week

## 2019-04-19 ENCOUNTER — Encounter: Payer: Self-pay | Admitting: *Deleted

## 2019-04-19 DIAGNOSIS — J449 Chronic obstructive pulmonary disease, unspecified: Secondary | ICD-10-CM

## 2019-04-19 NOTE — Progress Notes (Signed)
Pulmonary Individual Treatment Plan  Patient Details  Name: Austin Blevins MRN: [id number] Date of Birth: 1961/04/03 Referring Provider:     Pulmonary Rehab from 03/13/2019 in [facility] Cardiac and Pulmonary Rehab  Referring Provider  Raul Del      Initial Encounter Date:    Pulmonary Rehab from 03/13/2019 in [facility] Cardiac and Pulmonary Rehab  Date  03/13/19      Visit Diagnosis: Chronic obstructive pulmonary disease, unspecified COPD type ([place])  Patient's Home Medications on Admission:  Current Outpatient Medications:  .  albuterol (PROVENTIL) (2.5 MG/3ML) 0.083% nebulizer solution, USE ONE VIAL IN NEBULIZER EVERY SIX HOURS AS NEEDED FOR WHEEZING OR SHORTNESS OF BREATH, Disp: 1080 mL, Rfl: 3 .  albuterol (VENTOLIN HFA) 108 (90 Base) MCG/ACT inhaler, INHALE 2 PUFFS INTO THE LUNGS EVERY 4 HOURS AS NEEDED FOR WHEEZING OR SHORTNESS OF BREATH, Disp: 17 g, Rfl: 3 .  Fluticasone-Salmeterol (ADVAIR DISKUS) 250-50 MCG/DOSE AEPB, Inhale 1 puff into the lungs 2 (two) times daily., Disp: 60 each, Rfl: 5 .  omeprazole (PRILOSEC) 20 MG capsule, TAKE 1 CAPSULE BY MOUTH EVERY DAY, Disp: 90 capsule, Rfl: 3 .  predniSONE (DELTASONE) 10 MG tablet, Take 10 mg by mouth daily with breakfast., Disp: , Rfl:  .  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  sertraline (ZOLOFT) 100 MG tablet, Take 1 tablet (100 mg total) by mouth daily., Disp: 30 tablet, Rfl: 3 .  SPIRIVA HANDIHALER 18 MCG inhalation capsule, INHALE CONTENTS OF 1 CAPSULE AS DIRECTEDEVERY DAY WITH HANDIHALER, Disp: 30 capsule, Rfl: 2  Past Medical History: Past Medical History:  Diagnosis Date  . Anxiety   . COPD (chronic obstructive pulmonary disease) ([place])   . Depression   . Hydrocephalus (HCC)     Tobacco Use: Social History   Tobacco Use  Smoking Status Current Every Day Smoker  . Packs/day: 1.00  . Years: 40.00  .  Pack years: 40.00  . Types: Cigarettes  Smokeless Tobacco Never Used  Tobacco Comment   Informed patient that we can give him materials when he is ready to quit.    Labs: Recent Review Flowsheet Data    Labs for ITP Cardiac and Pulmonary Rehab Latest Ref Rng & Units 02/11/2017 08/12/2017 10/24/2018   Cholestrol 100 - 199 mg/dL 165 168 -   LDLCALC 0 - 99 mg/dL 109(H) 118(H) -   HDL >39 mg/dL 34(L) 32(L) -   Trlycerides 0 - 149 mg/dL 112 90 -   Hemoglobin A1c 4.8 - 5.6 % 6.4(H) 6.1(H) 5.6       Pulmonary Assessment Scores: Pulmonary Assessment Scores    Row Name 03/13/19 1705         ADL UCSD   SOB Score total  97     Rest  1     Walk  3     Stairs  5     Bath  5     Dress  4     Shop  5       CAT Score   CAT Score  24       mMRC Score   mMRC Score  4        UCSD: Self-administered rating of dyspnea associated with activities of daily living (ADLs) 6-point scale (0 = "not at all" to 5 = "maximal or unable to do because of breathlessness")  Scoring Scores range  from 0 to 120.  Minimally important difference is 5 units  CAT: CAT can identify the health impairment of COPD patients and is better correlated with disease progression.  CAT has a scoring range of zero to 40. The CAT score is classified into four groups of low (less than 10), medium (10 - 20), high (21-30) and very high (31-40) based on the impact level of disease on health status. A CAT score over 10 suggests significant symptoms.  A worsening CAT score could be explained by an exacerbation, poor medication adherence, poor inhaler technique, or progression of COPD or comorbid conditions.  CAT MCID is 2 points  mMRC: mMRC (Modified Medical Research Council) Dyspnea Scale is used to assess the degree of baseline functional disability in patients of respiratory disease due to dyspnea. No minimal important difference is established. A decrease in score of 1 point or greater is considered a positive change.    Pulmonary Function Assessment:   Exercise Target Goals: Exercise Program Goal: Individual exercise prescription set using results from initial 6 min walk test and THRR while considering  patient's activity barriers and safety.   Exercise Prescription Goal: Initial exercise prescription builds to 30-45 minutes a day of aerobic activity, 2-3 days per week.  Home exercise guidelines will be given to patient during program as part of exercise prescription that the participant will acknowledge.  Activity Barriers & Risk Stratification:   6 Minute Walk: 6 Minute Walk    Row Name 03/13/19 1657         6 Minute Walk   Phase  Initial     Distance  814 feet     Walk Time  5.25 minutes     # of Rest Breaks  1     MPH  1.76     METS  3.74     RPE  17     Perceived Dyspnea   4     VO2 Peak  13     Symptoms  Yes (comment)     Comments  shortness of breath     Resting HR  95 bpm     Resting BP  122/64     Resting Oxygen Saturation   95 %     Exercise Oxygen Saturation  during 6 min walk  83 %     Max Ex. HR  119 bpm     Max Ex. BP  146/66     2 Minute Post BP  134/74       Interval HR   1 Minute HR  107     2 Minute HR  111     3 Minute HR  112     4 Minute HR  113     5 Minute HR  114     6 Minute HR  119     2 Minute Post HR  108     Interval Heart Rate?  Yes       Interval Oxygen   Interval Oxygen?  Yes     Baseline Oxygen Saturation %  95 %     1 Minute Oxygen Saturation %  95 %     1 Minute Liters of Oxygen  2 L     2 Minute Oxygen Saturation %  86 %     2 Minute Liters of Oxygen  2 L     3 Minute Oxygen Saturation %  84 %     3 Minute Liters of Oxygen  2 L     4 Minute Oxygen Saturation %  87 %     4 Minute Liters of Oxygen  2 L     5 Minute Oxygen Saturation %  84 %     5 Minute Liters of Oxygen  2 L     6 Minute Oxygen Saturation %  83 %     6 Minute Liters of Oxygen  2 L     2 Minute Post Oxygen Saturation %  95 %     2 Minute Post Liters of Oxygen  2 L        Oxygen Initial Assessment: Oxygen Initial Assessment - 03/09/19 1335      Home Oxygen   Home Oxygen Device  Home Concentrator;E-Tanks    Sleep Oxygen Prescription  Continuous    Liters per minute  2    Home Exercise Oxygen Prescription  Continuous    Liters per minute  2    Home at Rest Exercise Oxygen Prescription  Continuous    Liters per minute  2    Compliance with Home Oxygen Use  Yes      Initial 6 min Walk   Oxygen Used  Continuous    Liters per minute  2      Program Oxygen Prescription   Program Oxygen Prescription  Continuous    Liters per minute  2      Intervention   Short Term Goals  To learn and exhibit compliance with exercise, home and travel O2 prescription;To learn and understand importance of monitoring SPO2 with pulse oximeter and demonstrate accurate use of the pulse oximeter.;To learn and understand importance of maintaining oxygen saturations>88%;To learn and demonstrate proper pursed lip breathing techniques or other breathing techniques.;To learn and demonstrate proper use of respiratory medications    Long  Term Goals  Exhibits compliance with exercise, home and travel O2 prescription;Verbalizes importance of monitoring SPO2 with pulse oximeter and return demonstration;Maintenance of O2 saturations>88%;Exhibits proper breathing techniques, such as pursed lip breathing or other method taught during program session;Compliance with respiratory medication;Demonstrates proper use of MDI's       Oxygen Re-Evaluation:   Oxygen Discharge (Final Oxygen Re-Evaluation):   Initial Exercise Prescription: Initial Exercise Prescription - 03/13/19 1600      Date of Initial Exercise RX and Referring Provider   Date  03/13/19    Referring Provider  Raul Del      Oxygen   Oxygen  Continuous    Liters  2      Treadmill   MPH  1    Grade  0    Minutes  15    METs  1.77   rest if needed     NuStep   Level  1    SPM  80    Minutes  15    METs  2       Recumbant Elliptical   Level  1    RPM  50    Minutes  15    METs  2      T5 Nustep   Level  1    SPM  80    Minutes  15    METs  2      Prescription Details   Frequency (times per week)  3    Duration  Progress to 30 minutes of continuous aerobic without signs/symptoms of physical distress      Intensity   THRR 40-80% of Max Heartrate  122-149  Ratings of Perceived Exertion  11-15    Perceived Dyspnea  0-4      Resistance Training   Training Prescription  Yes    Weight  3 lb    Reps  10-15       Perform Capillary Blood Glucose checks as needed.  Exercise Prescription Changes: Exercise Prescription Changes    Row Name 03/13/19 1700 04/05/19 1300           Response to Exercise   Blood Pressure (Admit)  122/64  128/78      Blood Pressure (Exercise)  146/66  120/70      Blood Pressure (Exit)  134/74  134/62      Heart Rate (Admit)  95 bpm  128 bpm      Heart Rate (Exercise)  119 bpm  121 bpm      Heart Rate (Exit)  108 bpm  100 bpm      Oxygen Saturation (Admit)  95 %  96 %      Oxygen Saturation (Exercise)  83 %  94 %      Oxygen Saturation (Exit)  95 %  97 %      Rating of Perceived Exertion (Exercise)  17  17      Perceived Dyspnea (Exercise)  4  3      Symptoms  shortness of breath  SOB      Comments  -  first full day of exercise      Duration  -  Progress to 30 minutes of  aerobic without signs/symptoms of physical distress      Intensity  -  THRR unchanged        Progression   Progression  -  Continue to progress workloads to maintain intensity without signs/symptoms of physical distress.      Average METs  -  1.4        Resistance Training   Training Prescription  -  Yes      Weight  -  3 lb      Reps  -  10-15        Interval Training   Interval Training  -  No        Treadmill   MPH  -  0.6      Grade  -  0      Minutes  -  9      METs  -  1.4        NuStep   Level  -  1      Minutes  -  15         Exercise  Comments:   Exercise Goals and Review: Exercise Goals    Row Name 03/13/19 1711             Exercise Goals   Increase Physical Activity  Yes       Intervention  Provide advice, education, support and counseling about physical activity/exercise needs.;Develop an individualized exercise prescription for aerobic and resistive training based on initial evaluation findings, risk stratification, comorbidities and participant's personal goals.       Expected Outcomes  Short Term: Attend rehab on a regular basis to increase amount of physical activity.;Long Term: Add in home exercise to make exercise part of routine and to increase amount of physical activity.;Long Term: Exercising regularly at least 3-5 days a week.       Increase Strength and Stamina  Yes       Intervention  Provide advice, education, support and counseling about physical activity/exercise needs.       Expected Outcomes  Short Term: Increase workloads from initial exercise prescription for resistance, speed, and METs.;Short Term: Perform resistance training exercises routinely during rehab and add in resistance training at home;Long Term: Improve cardiorespiratory fitness, muscular endurance and strength as measured by increased METs and functional capacity (6MWT)       Able to understand and use rate of perceived exertion (RPE) scale  Yes       Intervention  Provide education and explanation on how to use RPE scale       Expected Outcomes  Short Term: Able to use RPE daily in rehab to express subjective intensity level;Long Term:  Able to use RPE to guide intensity level when exercising independently       Able to understand and use Dyspnea scale  Yes       Intervention  Provide education and explanation on how to use Dyspnea scale       Expected Outcomes  Short Term: Able to use Dyspnea scale daily in rehab to express subjective sense of shortness of breath during exertion;Long Term: Able to use Dyspnea scale to guide intensity  level when exercising independently       Knowledge and understanding of Target Heart Rate Range (THRR)  Yes       Intervention  Provide education and explanation of THRR including how the numbers were predicted and where they are located for reference       Expected Outcomes  Short Term: Able to state/look up THRR;Short Term: Able to use daily as guideline for intensity in rehab;Long Term: Able to use THRR to govern intensity when exercising independently       Able to check pulse independently  Yes       Intervention  Provide education and demonstration on how to check pulse in carotid and radial arteries.;Review the importance of being able to check your own pulse for safety during independent exercise       Expected Outcomes  Short Term: Able to explain why pulse checking is important during independent exercise;Long Term: Able to check pulse independently and accurately       Understanding of Exercise Prescription  Yes       Intervention  Provide education, explanation, and written materials on patient's individual exercise prescription       Expected Outcomes  Short Term: Able to explain program exercise prescription;Long Term: Able to explain home exercise prescription to exercise independently          Exercise Goals Re-Evaluation : Exercise Goals Re-Evaluation    Row Name 03/20/19 1532 04/05/19 1340           Exercise Goal Re-Evaluation   Exercise Goals Review  Increase Physical Activity;Able to understand and use rate of perceived exertion (RPE) scale;Knowledge and understanding of Target Heart Rate Range (THRR);Understanding of Exercise Prescription;Increase Strength and Stamina;Able to understand and use Dyspnea scale;Able to check pulse independently  -      Comments  Reviewed RPE scale, THR and program prescription with pt today.  Pt voiced understanding and was given a copy of goals to take home.  Out since last review      Expected Outcomes  Short: Use RPE daily to regulate  intensity. Long: Follow program prescription in [facility].  -         Discharge Exercise Prescription (Final Exercise Prescription Changes): Exercise Prescription Changes - 04/05/19 1300  Response to Exercise   Blood Pressure (Admit)  128/78    Blood Pressure (Exercise)  120/70    Blood Pressure (Exit)  134/62    Heart Rate (Admit)  128 bpm    Heart Rate (Exercise)  121 bpm    Heart Rate (Exit)  100 bpm    Oxygen Saturation (Admit)  96 %    Oxygen Saturation (Exercise)  94 %    Oxygen Saturation (Exit)  97 %    Rating of Perceived Exertion (Exercise)  17    Perceived Dyspnea (Exercise)  3    Symptoms  SOB    Comments  first full day of exercise    Duration  Progress to 30 minutes of  aerobic without signs/symptoms of physical distress    Intensity  THRR unchanged      Progression   Progression  Continue to progress workloads to maintain intensity without signs/symptoms of physical distress.    Average METs  1.4      Resistance Training   Training Prescription  Yes    Weight  3 lb    Reps  10-15      Interval Training   Interval Training  No      Treadmill   MPH  0.6    Grade  0    Minutes  9    METs  1.4      NuStep   Level  1    Minutes  15       Nutrition:  Target Goals: Understanding of nutrition guidelines, daily intake of sodium <1564m, cholesterol <2021m calories 30% from fat and 7% or less from saturated fats, daily to have 5 or more servings of fruits and vegetables.  Biometrics: Pre Biometrics - 03/13/19 1704      Pre Biometrics   Height  5' 9.5" (1.765 m)    Weight  120 lb 9.6 oz (54.7 kg)    BMI (Calculated)  17.56        Nutrition Therapy Plan and Nutrition Goals:   Nutrition Assessments:   Nutrition Goals Re-Evaluation:   Nutrition Goals Discharge (Final Nutrition Goals Re-Evaluation):   Psychosocial: Target Goals: Acknowledge presence or absence of significant depression and/or stress, maximize coping skills, provide positive  support system. Participant is able to verbalize types and ability to use techniques and skills needed for reducing stress and depression.   Initial Review & Psychosocial Screening: Initial Psych Review & Screening - 03/09/19 1336      Initial Review   Current issues with  Current Psychotropic Meds;Current Stress Concerns    Source of Stress Concerns  Chronic Illness;Family    Comments  Patient has lost his brother to cancer. His father inlaw has cancer. His ex wife and he has COPD. His family has alot of health issues.      Family Dynamics   Good Support System?  Yes    Comments  He can look to his ex wife for support.      Barriers   Psychosocial barriers to participate in program  The patient should benefit from training in stress management and relaxation.;Psychosocial barriers identified (see note)      Screening Interventions   Interventions  Encouraged to exercise;To provide support and resources with identified psychosocial needs;Provide feedback about the scores to participant    Expected Outcomes  Short Term goal: Utilizing psychosocial counselor, staff and physician to assist with identification of specific Stressors or current issues interfering with healing process. Setting desired goal for  each stressor or current issue identified.;Long Term Goal: Stressors or current issues are controlled or eliminated.;Short Term goal: Identification and review with participant of any Quality of Life or Depression concerns found by scoring the questionnaire.;Long Term goal: The participant improves quality of Life and PHQ9 Scores as seen by post scores and/or verbalization of changes       Quality of Life Scores:  Scores of 19 and below usually indicate a poorer quality of life in these areas.  A difference of  2-3 points is a clinically meaningful difference.  A difference of 2-3 points in the total score of the Quality of Life Index has been associated with significant improvement in  overall quality of life, self-image, physical symptoms, and general health in studies assessing change in quality of life.  PHQ-9: Recent Review Flowsheet Data    Depression screen [facility] 2/9 03/24/2019 03/13/2019 02/20/2019 09/20/2018 08/09/2018   Decreased Interest 3 0 '1 3 1   ' Down, Depressed, Hopeless '2 3 1 3 1   ' PHQ - 2 Score '5 3 2 6 2   ' Altered sleeping '3 2 3 1 2   ' Tired, decreased energy '3 3 3 3 3   ' Change in appetite '1 3 2 1 3   ' Feeling bad or failure about yourself  '1 3 2 2 2   ' Trouble concentrating '3 3 3 1 ' 0   Moving slowly or fidgety/restless '2 2 1 ' 0 0   Suicidal thoughts 1 0 0 0 0   PHQ-9 Score '19 19 16 14 12   ' Difficult doing work/chores Somewhat difficult Very difficult Extremely dIfficult - Somewhat difficult     Interpretation of Total Score  Total Score Depression Severity:  1-4 = Minimal depression, 5-9 = Mild depression, 10-14 = Moderate depression, 15-19 = Moderately severe depression, 20-27 = Severe depression   Psychosocial Evaluation and Intervention:   Psychosocial Re-Evaluation:   Psychosocial Discharge (Final Psychosocial Re-Evaluation):   Education: Education Goals: Education classes will be provided on a weekly basis, covering required topics. Participant will state understanding/return demonstration of topics presented.  Learning Barriers/Preferences: Learning Barriers/Preferences - 03/09/19 1334      Learning Barriers/Preferences   Learning Barriers  None    Learning Preferences  Pictoral;Skilled Demonstration       Education Topics:  Initial Evaluation Education: - Verbal, written and demonstration of respiratory meds, oximetry and breathing techniques. Instruction on use of nebulizers and MDIs and importance of monitoring MDI activations.   Pulmonary Rehab from 03/09/2019 in [facility] Cardiac and Pulmonary Rehab  Date  03/09/19  Educator  [facility]  Instruction Review Code  1- Verbalizes Understanding      General Nutrition Guidelines/Fats and  Fiber: -Group instruction provided by verbal, written material, models and posters to present the general guidelines for heart healthy nutrition. Gives an explanation and review of dietary fats and fiber.   Controlling Sodium/Reading Food Labels: -Group verbal and written material supporting the discussion of sodium use in heart healthy nutrition. Review and explanation with models, verbal and written materials for utilization of the food label.   Exercise Physiology & General Exercise Guidelines: - Group verbal and written instruction with models to review the exercise physiology of the cardiovascular system and associated critical values. Provides general exercise guidelines with specific guidelines to those with heart or lung disease.    Aerobic Exercise & Resistance Training: - Gives group verbal and written instruction on the various components of exercise. Focuses on aerobic and resistive training programs and the benefits  of this training and how to safely progress through these programs.   Flexibility, Balance, Mind/Body Relaxation: Provides group verbal/written instruction on the benefits of flexibility and balance training, including mind/body exercise modes such as yoga, pilates and tai chi.  Demonstration and skill practice provided.   Stress and Anxiety: - Provides group verbal and written instruction about the health risks of elevated stress and causes of high stress.  Discuss the correlation between heart/lung disease and anxiety and treatment options. Review healthy ways to manage with stress and anxiety.   Depression: - Provides group verbal and written instruction on the correlation between heart/lung disease and depressed mood, treatment options, and the stigmas associated with seeking treatment.   Exercise & Equipment Safety: - Individual verbal instruction and demonstration of equipment use and safety with use of the equipment.   Pulmonary Rehab from 03/13/2019 in  [facility] Cardiac and Pulmonary Rehab  Date  03/13/19  Educator  AS  Instruction Review Code  1- Verbalizes Understanding      Infection Prevention: - Provides verbal and written material to individual with discussion of infection control including proper hand washing and proper equipment cleaning during exercise session.   Pulmonary Rehab from 03/13/2019 in [facility] Cardiac and Pulmonary Rehab  Date  03/13/19  Educator  AS  Instruction Review Code  1- Verbalizes Understanding      Falls Prevention: - Provides verbal and written material to individual with discussion of falls prevention and safety.   Pulmonary Rehab from 03/13/2019 in [facility] Cardiac and Pulmonary Rehab  Date  03/13/19  Educator  AS  Instruction Review Code  1- Verbalizes Understanding      Diabetes: - Individual verbal and written instruction to review signs/symptoms of diabetes, desired ranges of glucose level fasting, after meals and with exercise. Advice that pre and post exercise glucose checks will be done for 3 sessions at entry of program.   Chronic Lung Diseases: - Group verbal and written instruction to review updates, respiratory medications, advancements in procedures and treatments. Discuss use of supplemental oxygen including available portable oxygen systems, continuous and intermittent flow rates, concentrators, personal use and safety guidelines. Review proper use of inhaler and spacers. Provide informative websites for self-education.    Energy Conservation: - Provide group verbal and written instruction for methods to conserve energy, plan and organize activities. Instruct on pacing techniques, use of adaptive equipment and posture/positioning to relieve shortness of breath.   Triggers and Exacerbations: - Group verbal and written instruction to review types of environmental triggers and ways to prevent exacerbations. Discuss weather changes, air quality and the benefits of nasal washing. Review warning  signs and symptoms to help prevent infections. Discuss techniques for effective airway clearance, coughing, and vibrations.   AED/CPR: - Group verbal and written instruction with the use of models to demonstrate the basic use of the AED with the basic ABC's of resuscitation.   Anatomy and Physiology of the Lungs: - Group verbal and written instruction with the use of models to provide basic lung anatomy and physiology related to function, structure and complications of lung disease.   Anatomy & Physiology of the Heart: - Group verbal and written instruction and models provide basic cardiac anatomy and physiology, with the coronary electrical and arterial systems. Review of Valvular disease and Heart Failure   Cardiac Medications: - Group verbal and written instruction to review commonly prescribed medications for heart disease. Reviews the medication, class of the drug, and side effects.   Know Your Numbers and  Risk Factors: -Group verbal and written instruction about important numbers in your health.  Discussion of what are risk factors and how they play a role in the disease process.  Review of Cholesterol, Blood Pressure, Diabetes, and BMI and the role they play in your overall health.   Sleep Hygiene: -Provides group verbal and written instruction about how sleep can affect your health.  Define sleep hygiene, discuss sleep cycles and impact of sleep habits. Review good sleep hygiene tips.    Other: -Provides group and verbal instruction on various topics (see comments)    Knowledge Questionnaire Score: Knowledge Questionnaire Score - 03/13/19 1707      Knowledge Questionnaire Score   Pre Score  9/18        Core Components/Risk Factors/Patient Goals at Admission: Personal Goals and Risk Factors at Admission - 03/13/19 1710      Core Components/Risk Factors/Patient Goals on Admission    Weight Management  Yes;Weight Gain    Intervention  Weight Management: Develop a  combined nutrition and exercise program designed to reach desired caloric intake, while maintaining appropriate intake of nutrient and fiber, sodium and fats, and appropriate energy expenditure required for the weight goal.;Weight Management: Provide education and appropriate resources to help participant work on and attain dietary goals.;Weight Management/Obesity: Establish reasonable short term and long term weight goals.    Admit Weight  120 lb 9.6 oz (54.7 kg)    Goal Weight: Short Term  125 lb (56.7 kg)    Goal Weight: Long Term  130 lb (59 kg)    Expected Outcomes  Short Term: Continue to assess and modify interventions until short term weight is achieved;Long Term: Adherence to nutrition and physical activity/exercise program aimed toward attainment of established weight goal;Understanding recommendations for meals to include 15-35% energy as protein, 25-35% energy from fat, 35-60% energy from carbohydrates, less than 233m of dietary cholesterol, 20-35 gm of total fiber daily;Weight Gain: Understanding of general recommendations for a high calorie, high protein meal plan that promotes weight gain by distributing calorie intake throughout the day with the consumption for 4-5 meals, snacks, and/or supplements;Understanding of distribution of calorie intake throughout the day with the consumption of 4-5 meals/snacks    Tobacco Cessation  Yes    Number of packs per day  1 pack per day    Intervention  Assist the participant in steps to quit. Provide individualized education and counseling about committing to Tobacco Cessation, relapse prevention, and pharmacological support that can be provided by physician.;OAdvice worker assist with locating and accessing local/national Quit Smoking programs, and support quit date choice.    Expected Outcomes  Short Term: Will demonstrate readiness to quit, by selecting a quit date.;Short Term: Will quit all tobacco product use, adhering to prevention  of relapse plan.;Long Term: Complete abstinence from all tobacco products for at least 12 months from quit date.    Improve shortness of breath with ADL's  Yes    Intervention  Provide education, individualized exercise plan and daily activity instruction to help decrease symptoms of SOB with activities of daily living.    Expected Outcomes  Short Term: Improve cardiorespiratory fitness to achieve a reduction of symptoms when performing ADLs;Long Term: Be able to perform more ADLs without symptoms or delay the onset of symptoms       Core Components/Risk Factors/Patient Goals Review:    Core Components/Risk Factors/Patient Goals at Discharge (Final Review):    ITP Comments: ITP Comments    Row Name 03/09/19 1353  03/13/19 1705 03/20/19 1531 03/22/19 0656 04/05/19 1339   ITP Comments  Virtual Orientation performed. Patient informed when to come in for RD and EP orientation. Diagnosis can be found in [facility] 02/20/2019.  Initial 6MW test complete ITP created and sent to Dr Sabra Heck  First full day of exercise!  Patient was oriented to gym and equipment including functions, settings, policies, and procedures.  Patient's individual exercise prescription and treatment plan were reviewed.  All starting workloads were established based on the results of the 6 minute walk test done at initial orientation visit.  The plan for exercise progression was also introduced and progression will be customized based on patient's performance and goals.  30 day review completed. Continue with ITP sent to Dr. Emily Filbert, Medical Director of Cardiac and Pulmonary Rehab for review , changes as needed and signature.  New to program  Kailyn called on 11/11 to let us know that he was getting a CT scan to access some increased fluid.  He has a follow up appt scheduled for 11/25 next week.  He should know by then when he is able to return to rehab.   Ruth Name 04/18/19 1255 04/19/19 1036         ITP Comments  Art has been out  with continued pneumonia.  He will call when he can return to rehab.  30 day review competed . ITP sent to Dr Emily Filbert for review, changes as needed and ITP approval signature.         Comments:

## 2019-04-21 ENCOUNTER — Ambulatory Visit: Payer: Medicaid Other | Admitting: Nurse Practitioner

## 2019-04-24 ENCOUNTER — Encounter: Payer: Medicaid Other | Attending: Specialist

## 2019-04-24 ENCOUNTER — Other Ambulatory Visit: Payer: Self-pay | Admitting: Nurse Practitioner

## 2019-04-24 DIAGNOSIS — Z79899 Other long term (current) drug therapy: Secondary | ICD-10-CM | POA: Insufficient documentation

## 2019-04-24 DIAGNOSIS — Z7982 Long term (current) use of aspirin: Secondary | ICD-10-CM | POA: Insufficient documentation

## 2019-04-24 DIAGNOSIS — J449 Chronic obstructive pulmonary disease, unspecified: Secondary | ICD-10-CM | POA: Insufficient documentation

## 2019-04-24 DIAGNOSIS — F17210 Nicotine dependence, cigarettes, uncomplicated: Secondary | ICD-10-CM | POA: Insufficient documentation

## 2019-04-24 DIAGNOSIS — F419 Anxiety disorder, unspecified: Secondary | ICD-10-CM | POA: Insufficient documentation

## 2019-04-24 DIAGNOSIS — F329 Major depressive disorder, single episode, unspecified: Secondary | ICD-10-CM | POA: Insufficient documentation

## 2019-04-28 ENCOUNTER — Other Ambulatory Visit: Payer: Self-pay

## 2019-04-28 ENCOUNTER — Ambulatory Visit (INDEPENDENT_AMBULATORY_CARE_PROVIDER_SITE_OTHER): Payer: Medicaid Other | Admitting: Nurse Practitioner

## 2019-04-28 ENCOUNTER — Encounter: Payer: Self-pay | Admitting: Nurse Practitioner

## 2019-04-28 DIAGNOSIS — F331 Major depressive disorder, recurrent, moderate: Secondary | ICD-10-CM

## 2019-04-28 DIAGNOSIS — J449 Chronic obstructive pulmonary disease, unspecified: Secondary | ICD-10-CM | POA: Diagnosis not present

## 2019-04-28 MED ORDER — SERTRALINE HCL 50 MG PO TABS: 50.0000 mg | ORAL_TABLET | Freq: Every day | ORAL | 3 refills | Status: DC

## 2019-04-28 MED ORDER — BUSPIRONE HCL 10 MG PO TABS: 10.0000 mg | ORAL_TABLET | Freq: Two times a day (BID) | ORAL | 2 refills | Status: DC

## 2019-04-28 MED ORDER — MONTELUKAST SODIUM 10 MG PO TABS: 10.0000 mg | ORAL_TABLET | Freq: Every day | ORAL | 3 refills | Status: DC

## 2019-04-28 NOTE — Assessment & Plan Note (Signed)
Chronic, ongoing, continues to be exacerbated by his severe COPD and recent grieving.  Increase Sertraline to 150 MG daily and Buspar to 10 MG BID, scripts sent.  He denies SI/HI.  Return to office in 4 weeks.

## 2019-04-28 NOTE — Patient Instructions (Signed)

## 2019-04-28 NOTE — Progress Notes (Signed)
There were no vitals taken for this visit.   Subjective:    Patient ID: Austin Blevins, male    DOB: December 20, 1960, 58 y.o.   MRN: [id number]  HPI: Austin Blevins is a 58 y.o. male  Chief Complaint  Patient presents with  . COPD  . Depression    . This visit was completed via telephone due to the restrictions of the COVID-19 pandemic. All issues as above were discussed and addressed but no physical exam was performed. If it was felt that the patient should be evaluated in the office, they were directed there. The patient verbally consented to this visit. Patient was unable to complete an audio/visual visit due to telephone only. Due to the catastrophic nature of the COVID-19 pandemic, this visit was done through audio contact only. . Location of the patient: home . Location of the provider: work . Those involved with this call:  . Provider: Aura Dials, DNP . CMA: Wilhemena Durie, CMA . Front Desk/Registration: Harriet Pho  . Time spent on call: 15 minutes on the phone discussing health concerns. 10 minutes total spent in review of patient's record and preparation of their chart.  . I verified patient identity using two factors (patient name and date of birth). Patient consents verbally to being seen via telemedicine visit today.    COPD Continues on Advair, Spiriva, and Albuterol.  Followed by Dr. Meredeth Ide with last visit 04/11/19.  Was treated with Levaquin 500 MG x 10 days, Singulair 10 MG QDAY, and Mucinex 600 MG Q12HRS during that visit.  He reports he does not think he is taking Singulair, looked in medication box with his mother and it is not there. Does endorse that when he gets up from sleeping he does have increased mucus, which scares him at times.  Continues using hospital bed, which he reports has made a drastic improvement for him and his back & shoulder pain with his COPD.   He is also followed by palliative team.   COPD status:stable, progressive Satisfied with  current treatment?:yes Oxygen use:yes, 2L continuous Dyspnea frequency:present Cough frequency:present chronic Rescue inhaler frequency:5-6 times a day Limitation of activity:yes Productive cough: chronic Last Spirometry:10/24/2018 Pneumovax:Up to Date Influenza:Up to Date  DEPRESSION Continues on 100 MG Sertraline, reports some benefit but not 100% as mood recently exacerbated by grieivng.  Dr. Meredeth Ide added Buspar 5 MG twice a day, which he reports is still not helping to 100%.  Discussed increasing both Sertraline and Buspar doses, which he is interested in.  Endorses stressors with his COPD and recent losses. Mood status: exacerbated Satisfied with current treatment?: yes Symptom severity: mild  Duration of current treatment : chronic Side effects: no Medication compliance: good compliance Psychotherapy/counseling: none Previous psychiatric medications: Sertraline Depressed mood: yes Anxious mood: no Anhedonia: no Significant weight loss or gain: no Insomnia: yes hard to fall asleep Fatigue: no Feelings of worthlessness or guilt: no Impaired concentration/indecisiveness: yes Suicidal ideations: no Hopelessness: no Crying spells: no Depression screen [facility] 2/9 04/28/2019 03/24/2019 03/13/2019 02/20/2019 09/20/2018  Decreased Interest 3 3 0 1 3  Down, Depressed, Hopeless 2 2 3 1 3   PHQ - 2 Score 5 5 3 2 6   Altered sleeping 3 3 2 3 1   Tired, decreased energy 3 3 3 3 3   Change in appetite 2 1 3 2 1   Feeling bad or failure about yourself  1 1 3 2 2   Trouble concentrating 1 3 3 3 1   Moving slowly or fidgety/restless  1 2 2 1  0  Suicidal thoughts 1 1 0 0 0  PHQ-9 Score 17 19 19 16 14   Difficult doing work/chores Very difficult Somewhat difficult Very difficult Extremely dIfficult -    Relevant past medical, surgical, family and social history reviewed and updated as indicated. Interim medical history since our last visit reviewed. Allergies and medications reviewed and  updated.  Review of Systems  Constitutional: Negative for activity change, diaphoresis, fatigue and fever.  Respiratory: Negative for cough, chest tightness, shortness of breath and wheezing.   Cardiovascular: Negative for chest pain, palpitations and leg swelling.  Gastrointestinal: Negative for abdominal distention, abdominal pain, constipation, diarrhea, nausea and vomiting.  Endocrine: Negative for cold intolerance, heat intolerance, polydipsia, polyphagia and polyuria.  Musculoskeletal: Negative.   Skin: Negative.   Neurological: Negative for dizziness, syncope, weakness, light-headedness, numbness and headaches.  Psychiatric/Behavioral: Negative.     Per HPI unless specifically indicated above     Objective:    There were no vitals taken for this visit.  Wt Readings from Last 3 Encounters:  03/24/19 120 lb (54.4 kg)  03/13/19 120 lb 9.6 oz (54.7 kg)  10/24/18 120 lb (54.4 kg)    Physical Exam   Unable to perform due to telephone visit only.  Results for orders placed or performed during the hospital encounter of 04/10/19  I-STAT creatinine  Result Value Ref Range   Creatinine, Ser 1.20 0.61 - 1.24 mg/dL      Assessment & Plan:   Problem List Items Addressed This Visit      Respiratory   Stage 3 severe COPD by GOLD classification ([place])    Chronic, ongoing, severe with O2 dependence. Recent treatment by pulmonary for exacerbation. Continue current medication and collaboration with pulmonary, palliative, and CCM team.  Sent in new script for Singulair to ensure he is taking.      Relevant Medications   montelukast (SINGULAIR) 10 MG tablet     Other   Depression, major, recurrent, moderate (HCC)    Chronic, ongoing, continues to be exacerbated by his severe COPD and recent grieving.  Increase Sertraline to 150 MG daily and Buspar to 10 MG BID, scripts sent.  He denies SI/HI.  Return to office in 4 weeks.      Relevant Medications   busPIRone (BUSPAR) 10 MG  tablet   sertraline (ZOLOFT) 50 MG tablet      I discussed the assessment and treatment plan with the patient. The patient was provided an opportunity to ask questions and all were answered. The patient agreed with the plan and demonstrated an understanding of the instructions.   The patient was advised to call back or seek an in-person evaluation if the symptoms worsen or if the condition fails to improve as anticipated.   I provided 15 minutes of time during this encounter.  Follow up plan: Return in about 4 weeks (around 05/26/2019) for Mood follow-up.

## 2019-04-28 NOTE — Assessment & Plan Note (Signed)
Chronic, ongoing, severe with O2 dependence. Recent treatment by pulmonary for exacerbation. Continue current medication and collaboration with pulmonary, palliative, and CCM team.  Sent in new script for Singulair to ensure he is taking.

## 2019-05-01 ENCOUNTER — Ambulatory Visit: Payer: Self-pay | Admitting: Licensed Clinical Social Worker

## 2019-05-01 DIAGNOSIS — J449 Chronic obstructive pulmonary disease, unspecified: Secondary | ICD-10-CM

## 2019-05-01 DIAGNOSIS — F4321 Adjustment disorder with depressed mood: Secondary | ICD-10-CM

## 2019-05-02 ENCOUNTER — Telehealth: Payer: Self-pay

## 2019-05-02 NOTE — Progress Notes (Signed)
Pulmonary Individual Treatment Plan  Patient Details  Name: Austin Blevins MRN: [id number] Date of Birth: 1961-02-14 Referring Provider:     Pulmonary Rehab from 03/13/2019 in [facility] Cardiac and Pulmonary Rehab  Referring Provider  Austin Blevins      Initial Encounter Date:    Pulmonary Rehab from 03/13/2019 in [facility] Cardiac and Pulmonary Rehab  Date  03/13/19      Visit Diagnosis: No diagnosis found.  Patient's Home Medications on Admission:  Current Outpatient Medications:  .  albuterol (PROVENTIL) (2.5 MG/3ML) 0.083% nebulizer solution, USE ONE VIAL IN NEBULIZER EVERY SIX HOURS AS NEEDED FOR WHEEZING OR SHORTNESS OF BREATH, Disp: 1080 mL, Rfl: 3 .  albuterol (VENTOLIN HFA) 108 (90 Base) MCG/ACT inhaler, INHALE 2 PUFFS INTO THE LUNGS EVERY 4 HOURS AS NEEDED FOR WHEEZING OR SHORTNESS OF BREATH, Disp: 17 g, Rfl: 3 .  busPIRone (BUSPAR) 10 MG tablet, Take 1 tablet (10 mg total) by mouth 2 (two) times daily., Disp: 60 tablet, Rfl: 2 .  Fluticasone-Salmeterol (ADVAIR DISKUS) 250-50 MCG/DOSE AEPB, Inhale 1 puff into the lungs 2 (two) times daily., Disp: 60 each, Rfl: 5 .  montelukast (SINGULAIR) 10 MG tablet, Take 1 tablet (10 mg total) by mouth at bedtime., Disp: 30 tablet, Rfl: 3 .  omeprazole (PRILOSEC) 20 MG capsule, TAKE 1 CAPSULE BY MOUTH EVERY DAY, Disp: 90 capsule, Rfl: 3 .  predniSONE (DELTASONE) 10 MG tablet, Take 10 mg by mouth daily with breakfast., Disp: , Rfl:  .  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  sertraline (ZOLOFT) 100 MG tablet, Take 1 tablet (100 mg total) by mouth daily., Disp: 30 tablet, Rfl: 3 .  sertraline (ZOLOFT) 50 MG tablet, Take 1 tablet (50 mg total) by mouth daily. To take with 100 MG tablet to = 150 MG total daily., Disp: 90 tablet, Rfl: 3 .  SPIRIVA HANDIHALER 18 MCG inhalation capsule, INHALE CONTENTS OF 1 CAPSULE AS DIRECTEDEVERY DAY WITH  HANDIHALER, Disp: 30 capsule, Rfl: 2  Past Medical History: Past Medical History:  Diagnosis Date  . Anxiety   . COPD (chronic obstructive pulmonary disease) ([place])   . Depression   . Hydrocephalus (H[facility])     Tobacco Use: Social History   Tobacco Use  Smoking Status Current Every Day Smoker  . Packs/day: 1.00  . Years: 40.00  . Pack years: 40.00  . Types: Cigarettes  Smokeless Tobacco Never Used  Tobacco Comment   Informed patient that we can give him materials when he is ready to quit.    Labs: Recent Review Flowsheet Data    Labs for [facility]P Cardiac and Pulmonary Rehab Latest Ref Rng & Units 02/11/2017 08/12/2017 10/24/2018   Cholestrol 100 - 199 mg/dL 165 168 -   LDLCALC 0 - 99 mg/dL 109(H) 118(H) -   HDL >39 mg/dL 34(L) 32(L) -   Trlycerides 0 - 149 mg/dL 112 90 -   Hemoglobin A1c 4.8 - 5.6 % 6.4(H) 6.1(H) 5.6       Pulmonary Assessment Scores: Pulmonary Assessment Scores    Row Name 03/13/19 1705         ADL UCSD   SOB Score total  97     Rest  1     Walk  3     Stairs  5     Bath  5     Dress  4  Shop  5       CAT Score   CAT Score  24       mMRC Score   mMRC Score  4        UCSD: Self-administered rating of dyspnea associated with activities of daily living (ADLs) 6-point scale (0 = "not at all" to 5 = "maximal or unable to do because of breathlessness")  Scoring Scores range from 0 to 120.  Minimally important difference is 5 units  CAT: CAT can identify the health impairment of COPD patients and is better correlated with disease progression.  CAT has a scoring range of zero to 40. The CAT score is classified into four groups of low (less than 10), medium (10 - 20), high (21-30) and very high (31-40) based on the impact level of disease on health status. A CAT score over 10 suggests significant symptoms.  A worsening CAT score could be explained by an exacerbation, poor medication adherence, poor inhaler technique, or progression of COPD or comorbid  conditions.  CAT MCID is 2 points  mMRC: mMRC (Modified Medical Research Council) Dyspnea Scale is used to assess the degree of baseline functional disability in patients of respiratory disease due to dyspnea. No minimal important difference is established. A decrease in score of 1 point or greater is considered a positive change.   Pulmonary Function Assessment:   Exercise Target Goals: Exercise Program Goal: Individual exercise prescription set using results from initial 6 min walk test and THRR while considering  patient's activity barriers and safety.   Exercise Prescription Goal: Initial exercise prescription builds to 30-45 minutes a day of aerobic activity, 2-3 days per week.  Home exercise guidelines will be given to patient during program as part of exercise prescription that the participant will acknowledge.  Activity Barriers & Risk Stratification:   6 Minute Walk: 6 Minute Walk    Row Name 03/13/19 1657         6 Minute Walk   Phase  Initial     Distance  814 feet     Walk Time  5.25 minutes     # of Rest Breaks  1     MPH  1.76     METS  3.74     RPE  17     Perceived Dyspnea   4     VO2 Peak  13     Symptoms  Yes (comment)     Comments  shortness of breath     Resting HR  95 bpm     Resting BP  122/64     Resting Oxygen Saturation   95 %     Exercise Oxygen Saturation  during 6 min walk  83 %     Max Ex. HR  119 bpm     Max Ex. BP  146/66     2 Minute Post BP  134/74       Interval HR   1 Minute HR  107     2 Minute HR  111     3 Minute HR  112     4 Minute HR  113     5 Minute HR  114     6 Minute HR  119     2 Minute Post HR  108     Interval Heart Rate?  Yes       Interval Oxygen   Interval Oxygen?  Yes     Baseline Oxygen Saturation %  95 %     1 Minute Oxygen Saturation %  95 %     1 Minute Liters of Oxygen  2 L     2 Minute Oxygen Saturation %  86 %     2 Minute Liters of Oxygen  2 L     3 Minute Oxygen Saturation %  84 %     3  Minute Liters of Oxygen  2 L     4 Minute Oxygen Saturation %  87 %     4 Minute Liters of Oxygen  2 L     5 Minute Oxygen Saturation %  84 %     5 Minute Liters of Oxygen  2 L     6 Minute Oxygen Saturation %  83 %     6 Minute Liters of Oxygen  2 L     2 Minute Post Oxygen Saturation %  95 %     2 Minute Post Liters of Oxygen  2 L       Oxygen Initial Assessment: Oxygen Initial Assessment - 03/09/19 1335      Home Oxygen   Home Oxygen Device  Home Concentrator;E-Tanks    Sleep Oxygen Prescription  Continuous    Liters per minute  2    Home Exercise Oxygen Prescription  Continuous    Liters per minute  2    Home at Rest Exercise Oxygen Prescription  Continuous    Liters per minute  2    Compliance with Home Oxygen Use  Yes      Initial 6 min Walk   Oxygen Used  Continuous    Liters per minute  2      Program Oxygen Prescription   Program Oxygen Prescription  Continuous    Liters per minute  2      Intervention   Short Term Goals  To learn and exhibit compliance with exercise, home and travel O2 prescription;To learn and understand importance of monitoring SPO2 with pulse oximeter and demonstrate accurate use of the pulse oximeter.;To learn and understand importance of maintaining oxygen saturations>88%;To learn and demonstrate proper pursed lip breathing techniques or other breathing techniques.;To learn and demonstrate proper use of respiratory medications    Long  Term Goals  Exhibits compliance with exercise, home and travel O2 prescription;Verbalizes importance of monitoring SPO2 with pulse oximeter and return demonstration;Maintenance of O2 saturations>88%;Exhibits proper breathing techniques, such as pursed lip breathing or other method taught during program session;Compliance with respiratory medication;Demonstrates proper use of MDI's       Oxygen Re-Evaluation:   Oxygen Discharge (Final Oxygen Re-Evaluation):   Initial Exercise Prescription: Initial Exercise  Prescription - 03/13/19 1600      Date of Initial Exercise RX and Referring Provider   Date  03/13/19    Referring Provider  Austin Blevins      Oxygen   Oxygen  Continuous    Liters  2      Treadmill   MPH  1    Grade  0    Minutes  15    METs  1.77   rest if needed     NuStep   Level  1    SPM  80    Minutes  15    METs  2      Recumbant Elliptical   Level  1    RPM  50    Minutes  15    METs  2      T5  Nustep   Level  1    SPM  80    Minutes  15    METs  2      Prescription Details   Frequency (times per week)  3    Duration  Progress to 30 minutes of continuous aerobic without signs/symptoms of physical distress      Intensity   THRR 40-80% of Max Heartrate  122-149    Ratings of Perceived Exertion  11-15    Perceived Dyspnea  0-4      Resistance Training   Training Prescription  Yes    Weight  3 lb    Reps  10-15       Perform Capillary Blood Glucose checks as needed.  Exercise Prescription Changes: Exercise Prescription Changes    Row Name 03/13/19 1700 04/05/19 1300           Response to Exercise   Blood Pressure (Admit)  122/64  128/78      Blood Pressure (Exercise)  146/66  120/70      Blood Pressure (Exit)  134/74  134/62      Heart Rate (Admit)  95 bpm  128 bpm      Heart Rate (Exercise)  119 bpm  121 bpm      Heart Rate (Exit)  108 bpm  100 bpm      Oxygen Saturation (Admit)  95 %  96 %      Oxygen Saturation (Exercise)  83 %  94 %      Oxygen Saturation (Exit)  95 %  97 %      Rating of Perceived Exertion (Exercise)  17  17      Perceived Dyspnea (Exercise)  4  3      Symptoms  shortness of breath  SOB      Comments  --  first full day of exercise      Duration  --  Progress to 30 minutes of  aerobic without signs/symptoms of physical distress      Intensity  --  THRR unchanged        Progression   Progression  --  Continue to progress workloads to maintain intensity without signs/symptoms of physical distress.      Average METs  --   1.4        Resistance Training   Training Prescription  --  Yes      Weight  --  3 lb      Reps  --  10-15        Interval Training   Interval Training  --  No        Treadmill   MPH  --  0.6      Grade  --  0      Minutes  --  9      METs  --  1.4        NuStep   Level  --  1      Minutes  --  15         Exercise Comments:   Exercise Goals and Review: Exercise Goals    Row Name 03/13/19 1711             Exercise Goals   Increase Physical Activity  Yes       Intervention  Provide advice, education, support and counseling about physical activity/exercise needs.;Develop an individualized exercise prescription for aerobic and resistive training based on initial evaluation findings, risk stratification, comorbidities  and participant's personal goals.       Expected Outcomes  Short Term: Attend rehab on a regular basis to increase amount of physical activity.;Long Term: Add in home exercise to make exercise part of routine and to increase amount of physical activity.;Long Term: Exercising regularly at least 3-5 days a week.       Increase Strength and Stamina  Yes       Intervention  Provide advice, education, support and counseling about physical activity/exercise needs.       Expected Outcomes  Short Term: Increase workloads from initial exercise prescription for resistance, speed, and METs.;Short Term: Perform resistance training exercises routinely during rehab and add in resistance training at home;Long Term: Improve cardiorespiratory fitness, muscular endurance and strength as measured by increased METs and functional capacity (6MWT)       Able to understand and use rate of perceived exertion (RPE) scale  Yes       Intervention  Provide education and explanation on how to use RPE scale       Expected Outcomes  Short Term: Able to use RPE daily in rehab to express subjective intensity level;Long Term:  Able to use RPE to guide intensity level when exercising independently        Able to understand and use Dyspnea scale  Yes       Intervention  Provide education and explanation on how to use Dyspnea scale       Expected Outcomes  Short Term: Able to use Dyspnea scale daily in rehab to express subjective sense of shortness of breath during exertion;Long Term: Able to use Dyspnea scale to guide intensity level when exercising independently       Knowledge and understanding of Target Heart Rate Range (THRR)  Yes       Intervention  Provide education and explanation of THRR including how the numbers were predicted and where they are located for reference       Expected Outcomes  Short Term: Able to state/look up THRR;Short Term: Able to use daily as guideline for intensity in rehab;Long Term: Able to use THRR to govern intensity when exercising independently       Able to check pulse independently  Yes       Intervention  Provide education and demonstration on how to check pulse in carotid and radial arteries.;Review the importance of being able to check your own pulse for safety during independent exercise       Expected Outcomes  Short Term: Able to explain why pulse checking is important during independent exercise;Long Term: Able to check pulse independently and accurately       Understanding of Exercise Prescription  Yes       Intervention  Provide education, explanation, and written materials on patient's individual exercise prescription       Expected Outcomes  Short Term: Able to explain program exercise prescription;Long Term: Able to explain home exercise prescription to exercise independently          Exercise Goals Re-Evaluation : Exercise Goals Re-Evaluation    Row Name 03/20/19 1532 04/05/19 1340           Exercise Goal Re-Evaluation   Exercise Goals Review  Increase Physical Activity;Able to understand and use rate of perceived exertion (RPE) scale;Knowledge and understanding of Target Heart Rate Range (THRR);Understanding of Exercise  Prescription;Increase Strength and Stamina;Able to understand and use Dyspnea scale;Able to check pulse independently  --      Comments  Reviewed RPE  scale, THR and program prescription with pt today.  Pt voiced understanding and was given a copy of goals to take home.  Out since last review      Expected Outcomes  Short: Use RPE daily to regulate intensity. Long: Follow program prescription in [facility].  --         Discharge Exercise Prescription (Final Exercise Prescription Changes): Exercise Prescription Changes - 04/05/19 1300      Response to Exercise   Blood Pressure (Admit)  128/78    Blood Pressure (Exercise)  120/70    Blood Pressure (Exit)  134/62    Heart Rate (Admit)  128 bpm    Heart Rate (Exercise)  121 bpm    Heart Rate (Exit)  100 bpm    Oxygen Saturation (Admit)  96 %    Oxygen Saturation (Exercise)  94 %    Oxygen Saturation (Exit)  97 %    Rating of Perceived Exertion (Exercise)  17    Perceived Dyspnea (Exercise)  3    Symptoms  SOB    Comments  first full day of exercise    Duration  Progress to 30 minutes of  aerobic without signs/symptoms of physical distress    Intensity  THRR unchanged      Progression   Progression  Continue to progress workloads to maintain intensity without signs/symptoms of physical distress.    Average METs  1.4      Resistance Training   Training Prescription  Yes    Weight  3 lb    Reps  10-15      Interval Training   Interval Training  No      Treadmill   MPH  0.6    Grade  0    Minutes  9    METs  1.4      NuStep   Level  1    Minutes  15       Nutrition:  Target Goals: Understanding of nutrition guidelines, daily intake of sodium <1584m, cholesterol <2022m calories 30% from fat and 7% or less from saturated fats, daily to have 5 or more servings of fruits and vegetables.  Biometrics: Pre Biometrics - 03/13/19 1704      Pre Biometrics   Height  5' 9.5" (1.765 m)    Weight  120 lb 9.6 oz (54.7 kg)    BMI  (Calculated)  17.56        Nutrition Therapy Plan and Nutrition Goals:   Nutrition Assessments:   Nutrition Goals Re-Evaluation:   Nutrition Goals Discharge (Final Nutrition Goals Re-Evaluation):   Psychosocial: Target Goals: Acknowledge presence or absence of significant depression and/or stress, maximize coping skills, provide positive support system. Participant is able to verbalize types and ability to use techniques and skills needed for reducing stress and depression.   Initial Review & Psychosocial Screening: Initial Psych Review & Screening - 03/09/19 1336      Initial Review   Current issues with  Current Psychotropic Meds;Current Stress Concerns    Source of Stress Concerns  Chronic Illness;Family    Comments  Patient has lost his brother to cancer. His father inlaw has cancer. His ex wife and he has COPD. His family has alot of health issues.      Family Dynamics   Good Support System?  Yes    Comments  He can look to his ex wife for support.      Barriers   Psychosocial barriers to participate in program  The patient should benefit from training in stress management and relaxation.;Psychosocial barriers identified (see note)      Screening Interventions   Interventions  Encouraged to exercise;To provide support and resources with identified psychosocial needs;Provide feedback about the scores to participant    Expected Outcomes  Short Term goal: Utilizing psychosocial counselor, staff and physician to assist with identification of specific Stressors or current issues interfering with healing process. Setting desired goal for each stressor or current issue identified.;Long Term Goal: Stressors or current issues are controlled or eliminated.;Short Term goal: Identification and review with participant of any Quality of Life or Depression concerns found by scoring the questionnaire.;Long Term goal: The participant improves quality of Life and PHQ9 Scores as seen by post  scores and/or verbalization of changes       Quality of Life Scores:  Scores of 19 and below usually indicate a poorer quality of life in these areas.  A difference of  2-3 points is a clinically meaningful difference.  A difference of 2-3 points in the total score of the Quality of Life Index has been associated with significant improvement in overall quality of life, self-image, physical symptoms, and general health in studies assessing change in quality of life.  PHQ-9: Recent Review Flowsheet Data    Depression screen [facility] 2/9 04/28/2019 03/24/2019 03/13/2019 02/20/2019 09/20/2018   Decreased Interest 3 3 0 1 3   Down, Depressed, Hopeless '2 2 3 1 3   ' PHQ - 2 Score '5 5 3 2 6   ' Altered sleeping '3 3 2 3 1   ' Tired, decreased energy '3 3 3 3 3   ' Change in appetite '2 1 3 2 1   ' Feeling bad or failure about yourself  '1 1 3 2 2   ' Trouble concentrating '1 3 3 3 1   ' Moving slowly or fidgety/restless '1 2 2 1 ' 0   Suicidal thoughts 1 1 0 0 0   PHQ-9 Score '17 19 19 16 14   ' Difficult doing work/chores Very difficult Somewhat difficult Very difficult Extremely dIfficult -     Interpretation of Total Score  Total Score Depression Severity:  1-4 = Minimal depression, 5-9 = Mild depression, 10-14 = Moderate depression, 15-19 = Moderately severe depression, 20-27 = Severe depression   Psychosocial Evaluation and Intervention:   Psychosocial Re-Evaluation:   Psychosocial Discharge (Final Psychosocial Re-Evaluation):   Education: Education Goals: Education classes will be provided on a weekly basis, covering required topics. Participant will state understanding/return demonstration of topics presented.  Learning Barriers/Preferences: Learning Barriers/Preferences - 03/09/19 1334      Learning Barriers/Preferences   Learning Barriers  None    Learning Preferences  Pictoral;Skilled Demonstration       Education Topics:  Initial Evaluation Education: - Verbal, written and demonstration of  respiratory meds, oximetry and breathing techniques. Instruction on use of nebulizers and MDIs and importance of monitoring MDI activations.   Pulmonary Rehab from 03/09/2019 in [facility] Cardiac and Pulmonary Rehab  Date  03/09/19  Educator  [facility]  Instruction Review Code  1- Verbalizes Understanding      General Nutrition Guidelines/Fats and Fiber: -Group instruction provided by verbal, written material, models and posters to present the general guidelines for heart healthy nutrition. Gives an explanation and review of dietary fats and fiber.   Controlling Sodium/Reading Food Labels: -Group verbal and written material supporting the discussion of sodium use in heart healthy nutrition. Review and explanation with models, verbal and written materials for utilization of the  food label.   Exercise Physiology & General Exercise Guidelines: - Group verbal and written instruction with models to review the exercise physiology of the cardiovascular system and associated critical values. Provides general exercise guidelines with specific guidelines to those with heart or lung disease.    Aerobic Exercise & Resistance Training: - Gives group verbal and written instruction on the various components of exercise. Focuses on aerobic and resistive training programs and the benefits of this training and how to safely progress through these programs.   Flexibility, Balance, Mind/Body Relaxation: Provides group verbal/written instruction on the benefits of flexibility and balance training, including mind/body exercise modes such as yoga, pilates and tai chi.  Demonstration and skill practice provided.   Stress and Anxiety: - Provides group verbal and written instruction about the health risks of elevated stress and causes of high stress.  Discuss the correlation between heart/lung disease and anxiety and treatment options. Review healthy ways to manage with stress and anxiety.   Depression: - Provides group  verbal and written instruction on the correlation between heart/lung disease and depressed mood, treatment options, and the stigmas associated with seeking treatment.   Exercise & Equipment Safety: - Individual verbal instruction and demonstration of equipment use and safety with use of the equipment.   Pulmonary Rehab from 03/13/2019 in [facility] Cardiac and Pulmonary Rehab  Date  03/13/19  Educator  AS  Instruction Review Code  1- Verbalizes Understanding      Infection Prevention: - Provides verbal and written material to individual with discussion of infection control including proper hand washing and proper equipment cleaning during exercise session.   Pulmonary Rehab from 03/13/2019 in [facility] Cardiac and Pulmonary Rehab  Date  03/13/19  Educator  AS  Instruction Review Code  1- Verbalizes Understanding      Falls Prevention: - Provides verbal and written material to individual with discussion of falls prevention and safety.   Pulmonary Rehab from 03/13/2019 in [facility] Cardiac and Pulmonary Rehab  Date  03/13/19  Educator  AS  Instruction Review Code  1- Verbalizes Understanding      Diabetes: - Individual verbal and written instruction to review signs/symptoms of diabetes, desired ranges of glucose level fasting, after meals and with exercise. Advice that pre and post exercise glucose checks will be done for 3 sessions at entry of program.   Chronic Lung Diseases: - Group verbal and written instruction to review updates, respiratory medications, advancements in procedures and treatments. Discuss use of supplemental oxygen including available portable oxygen systems, continuous and intermittent flow rates, concentrators, personal use and safety guidelines. Review proper use of inhaler and spacers. Provide informative websites for self-education.    Energy Conservation: - Provide group verbal and written instruction for methods to conserve energy, plan and organize activities.  Instruct on pacing techniques, use of adaptive equipment and posture/positioning to relieve shortness of breath.   Triggers and Exacerbations: - Group verbal and written instruction to review types of environmental triggers and ways to prevent exacerbations. Discuss weather changes, air quality and the benefits of nasal washing. Review warning signs and symptoms to help prevent infections. Discuss techniques for effective airway clearance, coughing, and vibrations.   AED/CPR: - Group verbal and written instruction with the use of models to demonstrate the basic use of the AED with the basic ABC's of resuscitation.   Anatomy and Physiology of the Lungs: - Group verbal and written instruction with the use of models to provide basic lung anatomy and physiology related to function,  structure and complications of lung disease.   Anatomy & Physiology of the Heart: - Group verbal and written instruction and models provide basic cardiac anatomy and physiology, with the coronary electrical and arterial systems. Review of Valvular disease and Heart Failure   Cardiac Medications: - Group verbal and written instruction to review commonly prescribed medications for heart disease. Reviews the medication, class of the drug, and side effects.   Know Your Numbers and Risk Factors: -Group verbal and written instruction about important numbers in your health.  Discussion of what are risk factors and how they play a role in the disease process.  Review of Cholesterol, Blood Pressure, Diabetes, and BMI and the role they play in your overall health.   Sleep Hygiene: -Provides group verbal and written instruction about how sleep can affect your health.  Define sleep hygiene, discuss sleep cycles and impact of sleep habits. Review good sleep hygiene tips.    Other: -Provides group and verbal instruction on various topics (see comments)    Knowledge Questionnaire Score: Knowledge Questionnaire Score -  03/13/19 1707      Knowledge Questionnaire Score   Pre Score  9/18        Core Components/Risk Factors/Patient Goals at Admission: Personal Goals and Risk Factors at Admission - 03/13/19 1710      Core Components/Risk Factors/Patient Goals on Admission    Weight Management  Yes;Weight Gain    Intervention  Weight Management: Develop a combined nutrition and exercise program designed to reach desired caloric intake, while maintaining appropriate intake of nutrient and fiber, sodium and fats, and appropriate energy expenditure required for the weight goal.;Weight Management: Provide education and appropriate resources to help participant work on and attain dietary goals.;Weight Management/Obesity: Establish reasonable short term and long term weight goals.    Admit Weight  120 lb 9.6 oz (54.7 kg)    Goal Weight: Short Term  125 lb (56.7 kg)    Goal Weight: Long Term  130 lb (59 kg)    Expected Outcomes  Short Term: Continue to assess and modify interventions until short term weight is achieved;Long Term: Adherence to nutrition and physical activity/exercise program aimed toward attainment of established weight goal;Understanding recommendations for meals to include 15-35% energy as protein, 25-35% energy from fat, 35-60% energy from carbohydrates, less than 248m of dietary cholesterol, 20-35 gm of total fiber daily;Weight Gain: Understanding of general recommendations for a high calorie, high protein meal plan that promotes weight gain by distributing calorie intake throughout the day with the consumption for 4-5 meals, snacks, and/or supplements;Understanding of distribution of calorie intake throughout the day with the consumption of 4-5 meals/snacks    Tobacco Cessation  Yes    Number of packs per day  1 pack per day    Intervention  Assist the participant in steps to quit. Provide individualized education and counseling about committing to Tobacco Cessation, relapse prevention, and  pharmacological support that can be provided by physician.;OAdvice worker assist with locating and accessing local/national Quit Smoking programs, and support quit date choice.    Expected Outcomes  Short Term: Will demonstrate readiness to quit, by selecting a quit date.;Short Term: Will quit all tobacco product use, adhering to prevention of relapse plan.;Long Term: Complete abstinence from all tobacco products for at least 12 months from quit date.    Improve shortness of breath with ADL's  Yes    Intervention  Provide education, individualized exercise plan and daily activity instruction to help decrease symptoms of SOB  with activities of daily living.    Expected Outcomes  Short Term: Improve cardiorespiratory fitness to achieve a reduction of symptoms when performing ADLs;Long Term: Be able to perform more ADLs without symptoms or delay the onset of symptoms       Core Components/Risk Factors/Patient Goals Review:    Core Components/Risk Factors/Patient Goals at Discharge (Final Review):    ITP Comments: ITP Comments    Row Name 03/09/19 1353 03/13/19 1705 03/20/19 1531 03/22/19 0656 04/05/19 1339   ITP Comments  Virtual Orientation performed. Patient informed when to come in for RD and EP orientation. Diagnosis can be found in [facility]/20/2019.  Initial 6MW test complete ITP created and sent to Dr Sabra Heck  First full day of exercise!  Patient was oriented to gym and equipment including functions, settings, policies, and procedures.  Patient's individual exercise prescription and treatment plan were reviewed.  All starting workloads were established based on the results of the 6 minute walk test done at initial orientation visit.  The plan for exercise progression was also introduced and progression will be customized based on patient's performance and goals.  30 day review completed. Continue with ITP sent to Dr. Emily Filbert, Medical Director of Cardiac and Pulmonary Rehab for  review , changes as needed and signature.  New to program  Austin Blevins called on 11/11 to let us know that he was getting a CT scan to access some increased fluid.  He has a follow up appt scheduled for 11/25 next week.  He should know by then when he is able to return to rehab.   Austin Blevins Name 04/18/19 1255 04/19/19 1036         ITP Comments  Austin Blevins has been out with continued pneumonia.  He will call when he can return to rehab.  30 day review competed . ITP sent to Dr Emily Filbert for review, changes as needed and ITP approval signature.         Comments: discharge ITP

## 2019-05-02 NOTE — Chronic Care Management (AMB) (Signed)
Care Management   Follow Up Note   05/02/2019 Name: Austin Blevins MRN: [id number] DOB: 1960/06/08  Referred by: [name], NP Reason for referral : Care Coordination   Austin Blevins is a 58 y.o. year old male who is a primary care patient of [name], [name], NP. The care management team was consulted for assistance with care management and care coordination needs.    Review of patient status, including review of consultants reports, relevant laboratory and other test results, and collaboration with appropriate care team members and the patient's provider was performed as part of comprehensive patient evaluation and provision of chronic care management services.    SDOH (Social Determinants of Health) screening performed today: Financial Strain . See Care Plan for related entries.   Advanced Directives: See Care Plan and Vynca application for related entries.   Goals Addressed    . "I need financial assistance" (pt-stated)       Current Barriers:  . Financial constraints . Limited social support . Limited access to food . Lacks knowledge of community resource: available financial and nutritional suport resources  - patient confirmed that he received [employer] coupons in the mail successfully   Clinical Social Work Clinical Goal(s):  [name] Kitchen Over the next 90 days, client will work with SW to address concerns related to lack of finances in order to afford nutritional supplies  . Over the next 90 days, patient will work with LCSW to address needs related to financial contstraints  . Over the next 90 days, patient will attend all scheduled medical appointments  Interventions: . Patient interviewed and appropriate assessments performed . Discussed plans with patient for ongoing care management follow up and provided patient with direct contact information for care management team . Added patient to the [employer] program in the past for patient to gain  coupons/assistance/information  . Patient confirms he received Ensure coupons that were mailed out by [facility] program . LCSW provided education on food insecurity/financial assistance resources within the area  . Positive reinforcement provided for recent self-care implementation . LCSW provided reflective listening throughout entire session   Patient Self Care Activities:  . Attends all scheduled provider appointments . Calls provider office for new concerns or questions  Please see past updates related to this goal by clicking on the "Past Updates" button in the selected goal      . SW- "I need support with my grief and depression" (pt-stated)       Current Barriers:  . Chronic Mental Health needs related to depression and grief (recent loss of brother in law) . Limited social support . Mental Health Concerns  . Social Isolation . Limited access to caregiver . Suicidal Ideation/Homicidal Ideation: No  Clinical Social Work Goal(s):  [name] Kitchen Over the next 90 days, patient will work with SW bi-monthly by telephone or in person to reduce or manage symptoms related to ongoing grief and depression . Over the next 90 days, patient will work with SW to address concerns related to gaining long term mental health follow up  Interventions: . Patient interviewed and appropriate assessments performed: brief mental health assessment . Patient interviewed and appropriate assessments performed . Discussed plans with patient for ongoing care management follow up and provided patient with direct contact information for care management team . Advised patient to implement appropriate self-care coping tools into his daily routine to combat depressive symptoms . Assisted patient/caregiver with obtaining information about health plan benefits . Provided education and assistance  to client regarding Advanced Directives. . Provided education to patient/caregiver about Hospice and/or Palliative Care  services . Referred patient to [facility] for therapy/counseling for grief. LCSW also provided education on available mental health support resources within the area. Patient agreeable to only [facility] referral at this time. . Grief Counseling provided during session . LCSW confirms having [facility] number and will use resource when needed. Pt is not actively receiving grief counseling anymore.   Patient Self Care Activities:  . Calls provider office for new concerns or questions  Patient Coping Strengths:  . Hopefulness . Self Advocate  Patient Self Care Deficits:  . Unable to independently manage mental health needs in the home. Mental health follow up encouraged and [facility] referral completed.  Please see past updates related to this goal by clicking on the "Past Updates" button in the selected goal      The care management team will reach out to the patient again over the next 90 days.   Dickie La, BSW, MSW, LCSW [facility] [place]  [facility] [email or web address] Phone: [phone]

## 2019-05-02 NOTE — Telephone Encounter (Signed)
Austin Blevins would like to discharge at this time due to other family issues/conflicts

## 2019-05-02 NOTE — Progress Notes (Signed)
Discharge Progress Report  Patient Details  Name: Austin Blevins K Brinley MRN: [id number] Date of Birth: 25-Apr-1961 Referring Provider:     [facility]  Referring Provider  Meredeth IdeFleming       Number of Visits: 2  Reason for Discharge:  Early Exit:  Personal  Smoking History:  Social History   Tobacco Use  Smoking Status Current Every Day Smoker  . Packs/day: 1.00  . Years: 40.00  . Pack years: 40.00  . Types: Cigarettes  Smokeless Tobacco Never Used  Tobacco Comment   Informed patient that we can give him materials when he is ready to quit.    Diagnosis:  No diagnosis found.  ADL UCSD: Pulmonary Assessment Scores    Row Name 03/13/19 1705         ADL UCSD   SOB Score total  97     Rest  1     Walk  3     Stairs  5     Bath  5     Dress  4     Shop  5       CAT Score   CAT Score  24       mMRC Score   mMRC Score  4        Initial Exercise Prescription: Initial Exercise Prescription - 03/13/19 1600      Date of Initial Exercise RX and Referring Provider   Date  03/13/19    Referring Provider  Meredeth IdeFleming      Oxygen   Oxygen  Continuous    Liters  2      Treadmill   MPH  1    Grade  0    Minutes  15    METs  1.77   rest if needed     NuStep   Level  1    SPM  80    Minutes  15    METs  2      Recumbant Elliptical   Level  1    RPM  50    Minutes  15    METs  2      T5 Nustep   Level  1    SPM  80    Minutes  15    METs  2      Prescription Details   Frequency (times per week)  3    Duration  Progress to 30 minutes of continuous aerobic without signs/symptoms of physical distress      Intensity   THRR 40-80% of Max Heartrate  122-149    Ratings of Perceived Exertion  11-15    Perceived Dyspnea  0-4      Resistance Training   Training Prescription  Yes    Weight  3 lb    Reps  10-15       Discharge Exercise Prescription (Final Exercise Prescription Changes): Exercise  Prescription Changes - 04/05/19 1300      Response to Exercise   Blood Pressure (Admit)  128/78    Blood Pressure (Exercise)  120/70    Blood Pressure (Exit)  134/62    Heart Rate (Admit)  128 bpm    Heart Rate (Exercise)  121 bpm    Heart Rate (Exit)  100 bpm    Oxygen Saturation (Admit)  96 %    Oxygen Saturation (Exercise)  94 %    Oxygen Saturation (Exit)  97 %  Rating of Perceived Exertion (Exercise)  17    Perceived Dyspnea (Exercise)  3    Symptoms  SOB    Comments  first full day of exercise    Duration  Progress to 30 minutes of  aerobic without signs/symptoms of physical distress    Intensity  THRR unchanged      Progression   Progression  Continue to progress workloads to maintain intensity without signs/symptoms of physical distress.    Average METs  1.4      Resistance Training   Training Prescription  Yes    Weight  3 lb    Reps  10-15      Interval Training   Interval Training  No      Treadmill   MPH  0.6    Grade  0    Minutes  9    METs  1.4      NuStep   Level  1    Minutes  15       Functional Capacity: 6 Minute Walk    Row Name 03/13/19 1657         6 Minute Walk   Phase  Initial     Distance  814 feet     Walk Time  5.25 minutes     # of Rest Breaks  1     MPH  1.76     METS  3.74     RPE  17     Perceived Dyspnea   4     VO2 Peak  13     Symptoms  Yes (comment)     Comments  shortness of breath     Resting HR  95 bpm     Resting BP  122/64     Resting Oxygen Saturation   95 %     Exercise Oxygen Saturation  during 6 min walk  83 %     Max Ex. HR  119 bpm     Max Ex. BP  146/66     2 Minute Post BP  134/74       Interval HR   1 Minute HR  107     2 Minute HR  111     3 Minute HR  112     4 Minute HR  113     5 Minute HR  114     6 Minute HR  119     2 Minute Post HR  108     Interval Heart Rate?  Yes       Interval Oxygen   Interval Oxygen?  Yes     Baseline Oxygen Saturation %  95 %     1 Minute Oxygen  Saturation %  95 %     1 Minute Liters of Oxygen  2 L     2 Minute Oxygen Saturation %  86 %     2 Minute Liters of Oxygen  2 L     3 Minute Oxygen Saturation %  84 %     3 Minute Liters of Oxygen  2 L     4 Minute Oxygen Saturation %  87 %     4 Minute Liters of Oxygen  2 L     5 Minute Oxygen Saturation %  84 %     5 Minute Liters of Oxygen  2 L     6 Minute Oxygen Saturation %  83 %     6 Minute  Liters of Oxygen  2 L     2 Minute Post Oxygen Saturation %  95 %     2 Minute Post Liters of Oxygen  2 L        Psychological, QOL, Others - Outcomes: PHQ 2/9: Depression screen [facility] 2/9 04/28/2019 03/24/2019 03/13/2019 02/20/2019 09/20/2018  Decreased Interest 3 3 0 1 3  Down, Depressed, Hopeless 2 2 3 1 3   PHQ - 2 Score 5 5 3 2 6   Altered sleeping 3 3 2 3 1   Tired, decreased energy 3 3 3 3 3   Change in appetite 2 1 3 2 1   Feeling bad or failure about yourself  1 1 3 2 2   Trouble concentrating 1 3 3 3 1   Moving slowly or fidgety/restless 1 2 2 1  0  Suicidal thoughts 1 1 0 0 0  PHQ-9 Score 17 19 19 16 14   Difficult doing work/chores Very difficult Somewhat difficult Very difficult Extremely dIfficult -    Quality of Life:   Personal Goals: Goals established at orientation with interventions provided to work toward goal. Personal Goals and Risk Factors at Admission - 03/13/19 1710      Core Components/Risk Factors/Patient Goals on Admission    Weight Management  Yes;Weight Gain    Intervention  Weight Management: Develop a combined nutrition and exercise program designed to reach desired caloric intake, while maintaining appropriate intake of nutrient and fiber, sodium and fats, and appropriate energy expenditure required for the weight goal.;Weight Management: Provide education and appropriate resources to help participant work on and attain dietary goals.;Weight Management/Obesity: Establish reasonable short term and long term weight goals.    Admit Weight  120 lb 9.6 oz (54.7 kg)     Goal Weight: Short Term  125 lb (56.7 kg)    Goal Weight: Long Term  130 lb (59 kg)    Expected Outcomes  Short Term: Continue to assess and modify interventions until short term weight is achieved;Long Term: Adherence to nutrition and physical activity/exercise program aimed toward attainment of established weight goal;Understanding recommendations for meals to include 15-35% energy as protein, 25-35% energy from fat, 35-60% energy from carbohydrates, less than 200mg  of dietary cholesterol, 20-35 gm of total fiber daily;Weight Gain: Understanding of general recommendations for a high calorie, high protein meal plan that promotes weight gain by distributing calorie intake throughout the day with the consumption for 4-5 meals, snacks, and/or supplements;Understanding of distribution of calorie intake throughout the day with the consumption of 4-5 meals/snacks    Tobacco Cessation  Yes    Number of packs per day  1 pack per day    Intervention  Assist the participant in steps to quit. Provide individualized education and counseling about committing to Tobacco Cessation, relapse prevention, and pharmacological support that can be provided by physician.;Advice worker, assist with locating and accessing local/national Quit Smoking programs, and support quit date choice.    Expected Outcomes  Short Term: Will demonstrate readiness to quit, by selecting a quit date.;Short Term: Will quit all tobacco product use, adhering to prevention of relapse plan.;Long Term: Complete abstinence from all tobacco products for at least 12 months from quit date.    Improve shortness of breath with ADL's  Yes    Intervention  Provide education, individualized exercise plan and daily activity instruction to help decrease symptoms of SOB with activities of daily living.    Expected Outcomes  Short Term: Improve cardiorespiratory fitness to achieve a reduction of symptoms when  performing ADLs;Long Term: Be able to  perform more ADLs without symptoms or delay the onset of symptoms        Personal Goals Discharge:   Exercise Goals and Review: Exercise Goals    Row Name 03/13/19 1711             Exercise Goals   Increase Physical Activity  Yes       Intervention  Provide advice, education, support and counseling about physical activity/exercise needs.;Develop an individualized exercise prescription for aerobic and resistive training based on initial evaluation findings, risk stratification, comorbidities and participant's personal goals.       Expected Outcomes  Short Term: Attend rehab on a regular basis to increase amount of physical activity.;Long Term: Add in home exercise to make exercise part of routine and to increase amount of physical activity.;Long Term: Exercising regularly at least 3-5 days a week.       Increase Strength and Stamina  Yes       Intervention  Provide advice, education, support and counseling about physical activity/exercise needs.       Expected Outcomes  Short Term: Increase workloads from initial exercise prescription for resistance, speed, and METs.;Short Term: Perform resistance training exercises routinely during rehab and add in resistance training at home;Long Term: Improve cardiorespiratory fitness, muscular endurance and strength as measured by increased METs and functional capacity ( )       Able to understand and use rate of perceived exertion (RPE) scale  Yes       Intervention  Provide education and explanation on how to use RPE scale       Expected Outcomes  Short Term: Able to use RPE daily in rehab to express subjective intensity level;Long Term:  Able to use RPE to guide intensity level when exercising independently       Able to understand and use Dyspnea scale  Yes       Intervention  Provide education and explanation on how to use Dyspnea scale       Expected Outcomes  Short Term: Able to use Dyspnea scale daily in rehab to express subjective sense of  shortness of breath during exertion;Long Term: Able to use Dyspnea scale to guide intensity level when exercising independently       Knowledge and understanding of Target Heart Rate Range (THRR)  Yes       Intervention  Provide education and explanation of THRR including how the numbers were predicted and where they are located for reference       Expected Outcomes  Short Term: Able to state/look up THRR;Short Term: Able to use daily as guideline for intensity in rehab;Long Term: Able to use THRR to govern intensity when exercising independently       Able to check pulse independently  Yes       Intervention  Provide education and demonstration on how to check pulse in carotid and radial arteries.;Review the importance of being able to check your own pulse for safety during independent exercise       Expected Outcomes  Short Term: Able to explain why pulse checking is important during independent exercise;Long Term: Able to check pulse independently and accurately       Understanding of Exercise Prescription  Yes       Intervention  Provide education, explanation, and written materials on patient's individual exercise prescription       Expected Outcomes  Short Term: Able to explain program exercise prescription;Long Term: Able to explain  home exercise prescription to exercise independently          Exercise Goals Re-Evaluation: Exercise Goals Re-Evaluation    Row Name 03/20/19 1532 04/05/19 1340           Exercise Goal Re-Evaluation   Exercise Goals Review  Increase Physical Activity;Able to understand and use rate of perceived exertion (RPE) scale;Knowledge and understanding of Target Heart Rate Range (THRR);Understanding of Exercise Prescription;Increase Strength and Stamina;Able to understand and use Dyspnea scale;Able to check pulse independently  --      Comments  Reviewed RPE scale, THR and program prescription with pt today.  Pt voiced understanding and was given a copy of goals to  take home.  Out since last review      Expected Outcomes  Short: Use RPE daily to regulate intensity. Long: Follow program prescription in THR.  --         Nutrition & Weight - Outcomes: Pre Biometrics - 03/13/19 1704      Pre Biometrics   Height  5' 9.5" (1.765 m)    Weight  120 lb 9.6 oz (54.7 kg)    BMI (Calculated)  17.56        Nutrition:   Nutrition Discharge:   Education Questionnaire Score: Knowledge Questionnaire Score - 03/13/19 1707      Knowledge Questionnaire Score   Pre Score  9/18       Goals reviewed with patient; copy given to patient.

## 2019-05-24 ENCOUNTER — Other Ambulatory Visit: Payer: Self-pay | Admitting: Nurse Practitioner

## 2019-05-24 NOTE — Telephone Encounter (Signed)
Forwarding medication refill request to PCP for review. 

## 2019-05-25 ENCOUNTER — Other Ambulatory Visit: Payer: Self-pay | Admitting: Nurse Practitioner

## 2019-05-25 NOTE — Telephone Encounter (Signed)
Routing to provider  

## 2019-05-25 NOTE — Telephone Encounter (Signed)
Requested medication (s) are due for refill today: no  Requested medication (s) are on the active medication list: yes  Last refill:  04/27/2019  Future visit scheduled: yes  Notes to clinic:  One inhaler should last at least one month. If the patient is requesting refills earlier, contact the patient to check for uncontrolled symptoms   Requested Prescriptions  Pending Prescriptions Disp Refills   albuterol (VENTOLIN HFA) 108 (90 Base) MCG/ACT inhaler [Pharmacy Med Name: ALBUTEROL SULFATE HFA 108 (90 BASE)] 17 g 3    Sig: INHALE 2 PUFFS INTO THE LUNGS EVERY 4 HOURS AS NEEDED FOR WHEEZING OR SHORTNESS OF BREATH      Pulmonology:  Beta Agonists Failed - 05/25/2019  2:30 PM      Failed - One inhaler should last at least one month. If the patient is requesting refills earlier, contact the patient to check for uncontrolled symptoms.      Passed - Valid encounter within last 12 months    Recent Outpatient Visits           3 weeks ago Stage 3 severe COPD by GOLD classification (HCC)   [facility] [place], [place] T, NP   2 months ago Depression, major, recurrent, moderate (HCC)   [facility] [name], Jolene T, NP   3 months ago Stage 3 severe COPD by GOLD classification (HCC)   [facility] [name], Jolene T, NP   5 months ago Acute bilateral low back pain without sciatica   [facility] [name], Jolene T, NP   7 months ago Stage 3 severe COPD by GOLD classification (HCC)   [facility] [name], [name], NP       Future Appointments             In 5 days [name], [name], NP [employer]

## 2019-05-26 ENCOUNTER — Ambulatory Visit: Payer: Medicare Other | Admitting: *Deleted

## 2019-05-26 DIAGNOSIS — J449 Chronic obstructive pulmonary disease, unspecified: Secondary | ICD-10-CM

## 2019-05-26 DIAGNOSIS — M545 Low back pain, unspecified: Secondary | ICD-10-CM

## 2019-05-26 DIAGNOSIS — F4321 Adjustment disorder with depressed mood: Secondary | ICD-10-CM

## 2019-05-26 DIAGNOSIS — E43 Unspecified severe protein-calorie malnutrition: Secondary | ICD-10-CM

## 2019-05-26 NOTE — Chronic Care Management (AMB) (Signed)
Chronic Care Management   Follow Up Note   05/26/2019 Name: ARVIN ABELLO MRN: [id number] DOB: [birth year]  Referred by: [name], NP Reason for referral : Chronic Care Management (COPD )   DOMONIC KIMBALL is a 59 y.o. year old male who is a primary care patient of [name], [name], NP. The CCM team was consulted for assistance with chronic disease management and care coordination needs.    Review of patient status, including review of consultants reports, relevant laboratory and other test results, and collaboration with appropriate care team members and the patient's provider was performed as part of comprehensive patient evaluation and provision of chronic care management services.    SDOH (Social Determinants of Health) screening performed today: Physical Activity. See Care Plan for related entries.   Outpatient Encounter Medications as of 05/26/2019  Medication Sig Note  . albuterol (PROVENTIL) (2.5 MG/3ML) 0.083% nebulizer solution USE ONE VIAL IN NEBULIZER EVERY SIX HOURS AS NEEDED FOR WHEEZING OR SHORTNESS OF BREATH   . albuterol (VENTOLIN HFA) 108 (90 Base) MCG/ACT inhaler INHALE 2 PUFFS INTO THE LUNGS EVERY 4 HOURS AS NEEDED FOR WHEEZING OR SHORTNESS OF BREATH   . busPIRone (BUSPAR) 10 MG tablet Take 1 tablet (10 mg total) by mouth 2 (two) times daily.   . Fluticasone-Salmeterol (ADVAIR DISKUS) 250-50 MCG/DOSE AEPB Inhale 1 puff into the lungs 2 (two) times daily.   . montelukast (SINGULAIR) 10 MG tablet Take 1 tablet (10 mg total) by mouth at bedtime.   [name] Kitchen omeprazole (PRILOSEC) 20 MG capsule TAKE 1 CAPSULE BY MOUTH EVERY DAY   . predniSONE (DELTASONE) 10 MG tablet Take 10 mg by mouth daily with breakfast.   . rOPINIRole (REQUIP) 0.5 MG tablet TAKE ONE TABLET EVERY MORNING AND TAKE THREE TABLETS AT BEDTIME AS NEEDED FOR RESTLESS LEG SYNDROME   . sertraline (ZOLOFT) 100 MG tablet Take 1 tablet (100 mg total) by mouth daily.   . sertraline (ZOLOFT) 50 MG tablet Take 1 tablet  (50 mg total) by mouth daily. To take with 100 MG tablet to = 150 MG total daily.   [name] Kitchen SPIRIVA HANDIHALER 18 MCG inhalation capsule INHALE CONTENTS OF 1 CAPSULE AS DIRECTEDEVERY DAY WITH HANDIHALER   . [DISCONTINUED] albuterol (PROVENTIL HFA;VENTOLIN HFA) 108 (90 Base) MCG/ACT inhaler Inhale 2 puffs into the lungs every 4 (four) hours as needed for wheezing or shortness of breath. 12/13/2018: Using 4x day  . [DISCONTINUED] albuterol (VENTOLIN HFA) 108 (90 Base) MCG/ACT inhaler INHALE 2 PUFFS INTO THE LUNGS EVERY 4 HOURS AS NEEDED FOR WHEEZING OR SHORTNESS OF BREATH   . [DISCONTINUED] rOPINIRole (REQUIP) 0.5 MG tablet TAKE ONE TABLET BY MOUTH EVERY MORNING AND 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    No facility-administered encounter medications on file as of 05/26/2019.     Goals Addressed            This Visit's Progress   . RN- COPD (pt-stated)       Current Barriers:  [name] Kitchen Knowledge deficits related to basic COPD self care/management . Knowledge deficit related to importance of energy conservation  . Financial Barriers   Case Manager Clinical Goal(s):  Over the next 90 days, patient will verbalize basic understanding of COPD disease process and self care activities   Interventions:   Provided patient with COPD action plan and reinforced importance of daily self assessment  Discussed Pulmonary Rehab and offered to assist with referral placement  Advised patient to engage in light exercise  as tolerated 3-5 days a week- . Patient stating his joints had been very achy lately from head to toe. Encouraged to move more and to discuss this with PCP at upcoming visit.  Discussed with patient copd symptoms, he said he had a rough morning and had a really hard time catching his breath with lots of coughing but it calmed down after meds.  Patient reported he had to stop Pulmonary Rehab related to a lot going on in his life: wife in ICU, Brother in law passed and Brother with  cancer. May try to restart it at a later time.   Patient stated he bought his nebulizer at a yard sale and it is older. Made him aware we could work towards getting him a new nebulizer machine.   Patient reports his new medicare benefit has started, he is no longer solely Medicaid.   Made patient aware there are samples of ensure and coupons at the PCP office when he returns for his upcoming visit 1/12.  Will report this to the oncoming RNCM.   Patient Self Care Activities:  Takes medications as prescribed including inhalers  Please see past updates related to this goal by clicking on the "Past Updates" button in the selected goal          The care management team will reach out to the patient again over the next 60  days.  The patient has been provided with contact information for the care management team and has been advised to call with any health related questions or concerns.    Ma Rings Melda Mermelstein RN, BSN Nurse Case Education officer, [facility]  [phone]) Business Mobile

## 2019-05-26 NOTE — Patient Instructions (Signed)
Thank you allowing the Chronic Care Management Team to be a part of your care! It was a pleasure speaking with you today!  CCM (Chronic Care Management) Team   Kamee Bobst RN, BSN Nurse Care Coordinator  [phone]  [facility] PharmD  Clinical Pharmacist  [phone]  Dickie La LCSW Clinical Social Worker [phone]  Goals Addressed            This Visit's Progress   . RN- COPD (pt-stated)       Current Barriers:  [name] Kitchen Knowledge deficits related to basic COPD self care/management . Knowledge deficit related to importance of energy conservation  . Financial Barriers   Case Manager Clinical Goal(s):  Over the next 90 days, patient will verbalize basic understanding of COPD disease process and self care activities   Interventions:   Provided patient with COPD action plan and reinforced importance of daily self assessment  Discussed Pulmonary Rehab and offered to assist with referral placement  Advised patient to engage in light exercise as tolerated 3-5 days a week- . Patient stating his joints had been very achy lately from head to toe. Encouraged to move more and to discuss this with PCP at upcoming visit.  Discussed with patient copd symptoms, he said he had a rough morning and had a really hard time catching his breath with lots of coughing but it calmed down after meds.  Patient reported he had to stop Pulmonary Rehab related to a lot going on in his life: wife in ICU, Brother in law passed and Brother with cancer. May try to restart it at a later time.   Patient stated he bought his nebulizer at a yard sale and it is older. Made him aware we could work towards getting him a new nebulizer machine.   Patient reports his new medicare benefit has started, he is no longer solely Medicaid.   Made patient aware there are samples of ensure and coupons at the PCP office when he returns for his upcoming visit 1/12.  Will report this to the oncoming RNCM.    Patient Self Care Activities:  Takes medications as prescribed including inhalers  Please see past updates related to this goal by clicking on the "Past Updates" button in the selected goal         The patient verbalized understanding of instructions provided today and declined a print copy of patient instruction materials.   The patient has been provided with contact information for the care management team and has been advised to call with any health related questions or concerns.

## 2019-05-27 ENCOUNTER — Encounter: Payer: Self-pay | Admitting: Nurse Practitioner

## 2019-05-27 DIAGNOSIS — R918 Other nonspecific abnormal finding of lung field: Secondary | ICD-10-CM | POA: Insufficient documentation

## 2019-05-30 ENCOUNTER — Encounter: Payer: Self-pay | Admitting: Nurse Practitioner

## 2019-05-30 ENCOUNTER — Ambulatory Visit (INDEPENDENT_AMBULATORY_CARE_PROVIDER_SITE_OTHER): Payer: Medicare Other | Admitting: Nurse Practitioner

## 2019-05-30 ENCOUNTER — Telehealth: Payer: Self-pay

## 2019-05-30 ENCOUNTER — Other Ambulatory Visit: Payer: Self-pay

## 2019-05-30 DIAGNOSIS — F331 Major depressive disorder, recurrent, moderate: Secondary | ICD-10-CM | POA: Diagnosis not present

## 2019-05-30 DIAGNOSIS — J449 Chronic obstructive pulmonary disease, unspecified: Secondary | ICD-10-CM

## 2019-05-30 NOTE — Patient Instructions (Signed)

## 2019-05-30 NOTE — Telephone Encounter (Signed)
SW received referral from PCP requesting follow-up with patient for support. SW contacted patient and schedule telephonic visit for 06-01-19 at 3:00PM.

## 2019-05-30 NOTE — Assessment & Plan Note (Signed)
Chronic, ongoing, severe with O2 dependence. Recent treatment by pulmonary for exacerbation. Continue current medication and collaboration with pulmonary, palliative, and CCM team.

## 2019-05-30 NOTE — Progress Notes (Signed)
There were no vitals taken for this visit.   Subjective:    Patient ID: Austin Blevins, male    DOB: 1960-07-22, 59 y.o.   MRN: [id number]  HPI: Austin Blevins is a 59 y.o. male  Chief Complaint  Patient presents with  . Depression    . This visit was completed via telephone due to the restrictions of the COVID-19 pandemic. All issues as above were discussed and addressed but no physical exam was performed. If it was felt that the patient should be evaluated in the office, they were directed there. The patient verbally consented to this visit. Patient was unable to complete an audio/visual visit due to Lack of equipment. Due to the catastrophic nature of the COVID-19 pandemic, this visit was done through audio contact only. . Location of the patient: home . Location of the provider: work . Those involved with this call:  . Provider: Marnee Guarneri, DNP . CMA: Yvonna Alanis, CMA . Front Desk/Registration: Don Perking  . Time spent on call: 15 minutes on the phone discussing health concerns. 10 minutes total spent in review of patient's record and preparation of their chart.  . I verified patient identity using two factors (patient name and date of birth). Patient consents verbally to being seen via telemedicine visit today.    DEPRESSION Currently taking Sertraline 150 MG daily and Buspar 10 MG BID.  Recently lost his father-in-law and his wife recently went into went unconscious while visiting him, she is in hospital and they have told him he needs to make decision on whether to take her off vent or not.  She came off vent and went to rehab for two weeks and is back home now.  He reports his mood is "okay".   Mood status: stable Satisfied with current treatment?: yes Symptom severity: moderate  Duration of current treatment : chronic Side effects: no Medication compliance: good compliance Psychotherapy/counseling: none Previous psychiatric medications: sertraline and  buspar Depressed mood: yes Anxious mood: no Anhedonia: no Significant weight loss or gain: no Insomnia: yes hard to fall asleep Fatigue: no Feelings of worthlessness or guilt: no Impaired concentration/indecisiveness: no Suicidal ideations: no Hopelessness: no Crying spells: no Depression screen [facility] 2/9 05/30/2019 04/28/2019 03/24/2019 03/13/2019 02/20/2019  Decreased Interest 3 3 3  0 1  Down, Depressed, Hopeless 2 2 2 3 1   PHQ - 2 Score 5 5 5 3 2   Altered sleeping 3 3 3 2 3   Tired, decreased energy 3 3 3 3 3   Change in appetite 3 2 1 3 2   Feeling bad or failure about yourself  2 1 1 3 2   Trouble concentrating 2 1 3 3 3   Moving slowly or fidgety/restless 1 1 2 2 1   Suicidal thoughts 1 1 1  0 0  PHQ-9 Score 20 17 19 19 16   Difficult doing work/chores Somewhat difficult Very difficult Somewhat difficult Very difficult Extremely dIfficult  Some recent data might be hidden   GAD 7 : Generalized Anxiety Score 06/24/2017  Nervous, Anxious, on Edge 3  Control/stop worrying 3  Worry too much - different things 3  Trouble relaxing 3  Restless 3  Easily annoyed or irritable 3  Afraid - awful might happen 1  Total GAD 7 Score 19  Anxiety Difficulty Very difficult   COPD Has CPAP, was started last pulmonary.  Last saw Dr. Raul Del 04/11/2019, was treated with Levaquin at that visit.  Also started on Singulair and Mucinex.   He is O2  dependent uses 2 - 3L [place].  He does endorse occasional discomfort, back and muscle pain.  Takes Requip for restless legs.  Would like something "stronger" like Vicodin, but discussed with him concerns of using opioids with his respiratory status.  He is followed by palliative and informed him that PCP would reach out to them for recommendations and visit. COPD status: stable Satisfied with current treatment?: yes Oxygen use: yes Dyspnea frequency: with activity Cough frequency: frequent Rescue inhaler frequency:  nebulizers Limitation of activity:  yes Productive cough: none Last Spirometry: with pulmonary Pneumovax: Up to Date Influenza: Up to Date  Relevant past medical, surgical, family and social history reviewed and updated as indicated. Interim medical history since our last visit reviewed. Allergies and medications reviewed and updated.  Review of Systems  Constitutional: Negative for activity change, diaphoresis, fatigue and fever.  Respiratory: Positive for cough (baseline), shortness of breath (baseline) and wheezing (baseline). Negative for chest tightness.   Cardiovascular: Negative for chest pain, palpitations and leg swelling.  Gastrointestinal: Negative.   Endocrine: Negative.   Musculoskeletal: Positive for arthralgias.  Neurological: Negative.   Psychiatric/Behavioral: Positive for sleep disturbance. Negative for decreased concentration, self-injury and suicidal ideas.    Per HPI unless specifically indicated above     Objective:    There were no vitals taken for this visit.  Wt Readings from Last 3 Encounters:  03/24/19 120 lb (54.4 kg)  03/13/19 120 lb 9.6 oz (54.7 kg)  10/24/18 120 lb (54.4 kg)    Physical Exam   Unable to perform due to telephone visit only.  Results for orders placed or performed during the hospital encounter of 04/10/19  I-STAT creatinine  Result Value Ref Range   Creatinine, Ser 1.20 0.61 - 1.24 mg/dL      Assessment & Plan:   Problem List Items Addressed This Visit      Respiratory   Stage 3 severe COPD by GOLD classification (HCC)    Chronic, ongoing, severe with O2 dependence. Recent treatment by pulmonary for exacerbation. Continue current medication and collaboration with pulmonary, palliative, and CCM team.          Other   Depression, major, recurrent, moderate (HCC)    Chronic, ongoing, continues to be exacerbated by his severe COPD and recent grieving.  Continue Sertraline 150 MG daily and Buspar 10 MG BID, he reports these work well and does not want to  adjust at this time.  He denies SI/HI.  Reached out and sent staff message to palliative SW to request visit with him and see if NP could visit and recommend safe pain options for patient.  Return in 6 weeks.         I discussed the assessment and treatment plan with the patient. The patient was provided an opportunity to ask questions and all were answered. The patient agreed with the plan and demonstrated an understanding of the instructions.   The patient was advised to call back or seek an in-person evaluation if the symptoms worsen or if the condition fails to improve as anticipated.   I provided 15 minutes of time during this encounter.  Follow up plan: Return in about 6 weeks (around 07/11/2019) for Mood and COPD + Pain.

## 2019-05-30 NOTE — Assessment & Plan Note (Signed)
Chronic, ongoing, continues to be exacerbated by his severe COPD and recent grieving.  Continue Sertraline 150 MG daily and Buspar 10 MG BID, he reports these work well and does not want to adjust at this time.  He denies SI/HI.  Reached out and sent staff message to palliative SW to request visit with him and see if NP could visit and recommend safe pain options for patient.  Return in 6 weeks.

## 2019-06-01 ENCOUNTER — Other Ambulatory Visit: Payer: Self-pay

## 2019-06-01 ENCOUNTER — Other Ambulatory Visit: Payer: Medicare Other | Admitting: Adult Health Nurse Practitioner

## 2019-06-01 ENCOUNTER — Other Ambulatory Visit: Payer: Medicare Other

## 2019-06-01 ENCOUNTER — Telehealth: Payer: Self-pay | Admitting: Adult Health Nurse Practitioner

## 2019-06-01 DIAGNOSIS — J449 Chronic obstructive pulmonary disease, unspecified: Secondary | ICD-10-CM

## 2019-06-01 DIAGNOSIS — Z515 Encounter for palliative care: Secondary | ICD-10-CM

## 2019-06-01 NOTE — Progress Notes (Signed)
COMMUNITY PALLIATIVE CARE SW NOTE  PATIENT NAME: Austin Blevins DOB: 1961/02/16 MRN: [id number]  PRIMARY CARE PROVIDER: [name], NP  RESPONSIBLE PARTY:  Acct ID - Guarantor Home Phone Work Phone Relationship Acct Type  [id number] Daryel Gerald* [phone]  Self P/F     [address] [place], [place] [address]     PLAN OF CARE and INTERVENTIONS:             1. GOALS OF CARE/ ADVANCE CARE PLANNING:  Patient is DNR. Goal is to  2. SOCIAL/EMOTIONAL/SPIRITUAL ASSESSMENT/ INTERVENTIONS:  SW completed TELEHEALTH visit with patient. Patient reports pain in his left shoulder and his back. Patient said he coughs often and hard, feels that this impacts his back pain. Patient was doing cardiac rehab but asked to discharge due to family stress. Patient said his appetite is good, discussed patient's diet. Patient is trying to incorporate more fruits and vegetables. Patient said he is trying to walk at least once a day, either outside or inside. Patient is sleeping well, has his hospital bed and said this helps to prop up his head while he sleeps. SW provided supportive counseling, discussed goals of care, used active and reflective listening. 3. PATIENT/CAREGIVER EDUCATION/ COPING:  Patient was alert, engaged during conversation. Patient openly expressed feelings. Patient's father-in-law and brother-in-law died last month. Patient's wife was in the hospital and then in rehab. Patient and wife do not live together, and are considered separated, but they have a good friendship per patient. Patient acknowledged that this past month has been challenging for their family. Patient said emotionally, this has been the hardest year for him and he is learning how to cope. Discussed coping skills. Patient takes walks, does crosswords, prays and read his Bible. Family is supportive, and patient talks to his wife often. 4. PERSONAL EMERGENCY PLAN:  Patient/family will call 9-1-1 for emergencies. 5. COMMUNITY RESOURCES  COORDINATION/ HEALTH CARE NAVIGATION:  Patient has phone call follow-up with PCP on 2/12. Patient is interested in shower chair/bench, SW messaged PCP requesting an order. Patient is waiting on call from Amy, NP to discuss pain management. SW scheduled another palliative care visit with patient on 2/4. 6. FINANCIAL/LEGAL CONCERNS/INTERVENTIONS:  Patient receives SSDI. Patient does receive food stamps. Patient has Medicaid coverage.     SOCIAL HX:  Social History   Tobacco Use  . Smoking status: Current Every Day Smoker    Packs/day: 1.00    Years: 40.00    Pack years: 40.00    Types: Cigarettes  . Smokeless tobacco: Never Used  . Tobacco comment: Informed patient that we can give him materials when he is ready to quit.  Substance Use Topics  . Alcohol use: No    CODE STATUS:   Code Status: Prior (DNR) ADVANCED DIRECTIVES: N MOST FORM COMPLETE:  No. HOSPICE EDUCATION PROVIDED: None.  PPS: Patient is independent of ADLs.  Due to the COVID-19 , this visit was done via telephone from my office and it was initiated and consent by this patient and/or family. This was a scheduled visit.   I spent 30 minutes with patient/family, from 3:00-3:30p providing education, support and consultation.   Richrd Sox, LCSW

## 2019-06-01 NOTE — Telephone Encounter (Signed)
Spoke with patient earlier today and set up appointment for this afternoon Valory Wetherby K. Garner Nash NP

## 2019-06-02 ENCOUNTER — Other Ambulatory Visit: Payer: Self-pay

## 2019-06-02 NOTE — Addendum Note (Signed)
Addended by: Richrd Sox on: 06/02/2019 01:12 PM   Modules accepted: Level of Service

## 2019-06-02 NOTE — Progress Notes (Signed)
[facility] Consult Note Telephone: [phone]  Fax: [phone]  PATIENT NAME: Austin Blevins DOB: 1960-09-01 MRN: [id number]  PRIMARY CARE PROVIDER:   [name], NP  REFERRING PROVIDER:  [name], NP [address] [place],  [place] [address]  RESPONSIBLE PARTY:   Self [phone]  Due to the COVID-19 crisis, this visit was done via telemedicine and it was initiated and consent by this patient and or family. Video-audio (telehealth) contact was unable to be done due to technical barriers from the patient's side.    RECOMMENDATIONS and PLAN:  1.  Advanced care planning. Patient is a DNR.  2.  Stage 3 COPD.  Recently started on singulair and mucinex and states that he has noticed some improvement with this.  Chronic DOE and chronic stable. Uses O2 @ 2L and bumps it up to 3L with activity.  Continue follow up and recommendations by pulmonology.  3.  Pain.  He states that he has been having back and muscle pain for about a year.  Has been getting worse over the past few months.  State that the pain is more constant in his back and up to his shoulders.  The left shoulder hurts worse than the right.  If he is on his feet for more than 5 minutes states that his hips and knees will start hurting.  Has tried OTC muscle rubs and patches with no relief.  States that he was given tramadol at a walk in clinic that did not give him relief.  States that he takes a lot of BC powders with some relief.  Have warned him of risk of GI bleeding with taking too many BC powders.  Have reached out to PCP with recommendations for Cymbalta or amitriptyline to help with the pain.  Patient is on buspar and sertraline for depression and may need to make adjustments to these medications if starting Cymbalta to decrease risk of serotonin syndrome.  Can also add voltaren gel to help with pain that may be related to inflammation.  4.  Depression.  Patient has  recently lost his father in law who he was close to and his wife has new ongoing medical conditions.  Currently on buspar and sertraline.  States that this is helping and that he takes each day as it comes.  Patient is being followed by palliative SW.  Continue to monitor and make adjustments as needed. See above for making adjustments if adding Cymbalta for pain.    Palliative will continue to monitor for symptom management and/or decline and will make recommendations as needed.  Have appointment in one week.  I spent 30 minutes providing this consultation, including time with patient and family, chart review, provider collaboration, and documentation  . More than 50% of the time in this consultation was spent coordinating communication.   HISTORY OF PRESENT ILLNESS:  Austin Blevins is a 59 y.o. year old male with multiple medical problems including severeCOPD, GERD, nicotine dependence, depression, restless leg syndrome, persistent cough, shortness of breath. Palliative Care was asked to help address goals of care.   CODE STATUS: DNR  PPS: 60% HOSPICE ELIGIBILITY/DIAGNOSIS: TBD  PHYSICAL EXAM:   Deferred  PAST MEDICAL HISTORY:  Past Medical History:  Diagnosis Date  . Anxiety   . COPD (chronic obstructive pulmonary disease) ([facility])   . Depression   . Hydrocephalus ([facility])     SOCIAL HX:  Social History   Tobacco Use  .  Smoking status: Current Every Day Smoker    Packs/day: 1.00    Years: 40.00    Pack years: 40.00    Types: Cigarettes  . Smokeless tobacco: Never Used  . Tobacco comment: Informed patient that we can give him materials when he is ready to quit.  Substance Use Topics  . Alcohol use: No    ALLERGIES:  Allergies  Allergen Reactions  . Acetaminophen Other (See Comments)    Syncope  . Percocet [Oxycodone-Acetaminophen]      PERTINENT MEDICATIONS:  Outpatient Encounter Medications as of 06/01/2019  Medication Sig  . albuterol (PROVENTIL) (2.5 MG/3ML) 0.083%  nebulizer solution USE ONE VIAL IN NEBULIZER EVERY SIX HOURS AS NEEDED FOR WHEEZING OR SHORTNESS OF BREATH  . albuterol (VENTOLIN HFA) 108 (90 Base) MCG/ACT inhaler INHALE 2 PUFFS INTO THE LUNGS EVERY 4 HOURS AS NEEDED FOR WHEEZING OR SHORTNESS OF BREATH  . busPIRone (BUSPAR) 10 MG tablet Take 1 tablet (10 mg total) by mouth 2 (two) times daily.  . Fluticasone-Salmeterol (ADVAIR DISKUS) 250-50 MCG/DOSE AEPB Inhale 1 puff into the lungs 2 (two) times daily.  . montelukast (SINGULAIR) 10 MG tablet Take 1 tablet (10 mg total) by mouth at bedtime.  [name] Kitchen omeprazole (PRILOSEC) 20 MG capsule TAKE 1 CAPSULE BY MOUTH EVERY DAY  . predniSONE (DELTASONE) 10 MG tablet Take 10 mg by mouth daily with breakfast.  . rOPINIRole (REQUIP) 0.5 MG tablet TAKE ONE TABLET EVERY MORNING AND TAKE THREE TABLETS AT BEDTIME AS NEEDED FOR RESTLESS LEG SYNDROME  . sertraline (ZOLOFT) 100 MG tablet Take 1 tablet (100 mg total) by mouth daily.  . sertraline (ZOLOFT) 50 MG tablet Take 1 tablet (50 mg total) by mouth daily. To take with 100 MG tablet to = 150 MG total daily.  [name] Kitchen SPIRIVA HANDIHALER 18 MCG inhalation capsule INHALE CONTENTS OF 1 CAPSULE AS DIRECTEDEVERY DAY WITH HANDIHALER  . [DISCONTINUED] albuterol (PROVENTIL HFA;VENTOLIN HFA) 108 (90 Base) MCG/ACT inhaler Inhale 2 puffs into the lungs every 4 (four) hours as needed for wheezing or shortness of breath.  . [DISCONTINUED] albuterol (VENTOLIN HFA) 108 (90 Base) MCG/ACT inhaler INHALE 2 PUFFS INTO THE LUNGS EVERY 4 HOURS AS NEEDED FOR WHEEZING OR SHORTNESS OF BREATH  . [DISCONTINUED] rOPINIRole (REQUIP) 0.5 MG tablet TAKE ONE TABLET BY MOUTH EVERY MORNING AND 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   No facility-administered encounter medications on file as of 06/01/2019.      Selig Wampole Marlena Clipper, NP

## 2019-06-09 ENCOUNTER — Other Ambulatory Visit: Payer: Self-pay

## 2019-06-09 ENCOUNTER — Other Ambulatory Visit: Payer: Medicare Other | Admitting: Adult Health Nurse Practitioner

## 2019-06-09 DIAGNOSIS — Z515 Encounter for palliative care: Secondary | ICD-10-CM

## 2019-06-09 DIAGNOSIS — J449 Chronic obstructive pulmonary disease, unspecified: Secondary | ICD-10-CM

## 2019-06-09 NOTE — Progress Notes (Signed)
Therapist, nutritional Palliative Care Consult Note Telephone: [phone]  Fax: [phone]  PATIENT NAME: Austin Blevins DOB: 1961/04/03 MRN: [id number]  PRIMARY CARE PROVIDER:   [name], NP  REFERRING PROVIDER:  [name], NP [address] [place],  [place] [address]  RESPONSIBLE PARTY:   Self [phone]  Due to the COVID-19 crisis, this visit was done via telemedicine and it was initiated and consent by this patient and or family. Video-audio (telehealth) contact was unable to be done due to technical barriers from the patient's side.    RECOMMENDATIONS and PLAN:  1.  Advanced care planning. Patient is a DNR.  2.  Pain. This visit was to follow up on pain.  He has been having increased pain in his back, shoulders, hips, and knees.  He states that this morning he woke up with really bad right knee pain.  It is worse with weight bearing.  Denies trauma or falls.  States that there is no swelling that he can tell.  Denies redness or warmth to touch.  States that [facility] powders does ease off the pain.  Have suggested using capsaicin cream for his joint pain.  He currently takes prednisone 10 mg daily for his COPD and have reached out to PCP for possibly increasing this for a few days to see that will calm down his pain.  PCP did say that she was going to call him today and discuss this with him.  He does have an appointment with PCP on 06/13/2019.  Will follow up in 2 weeks    I spent 25 minutes providing this consultation, including time with patient/family, chart review, provider coordination, and documentation . More than 50% of the time in this consultation was spent coordinating communication.   HISTORY OF PRESENT ILLNESS:  Austin Blevins is a 59 y.o. year old male with multiple medical problems including severeCOPD, GERD, nicotine dependence, depression, restless leg syndrome, persistent cough, shortness of breath. Palliative Care was asked to help  address goals of care.   CODE STATUS: DNR  PPS: 60% HOSPICE ELIGIBILITY/DIAGNOSIS: TBD  PHYSICAL EXAM:   Deferred  PAST MEDICAL HISTORY:  Past Medical History:  Diagnosis Date  . Anxiety   . COPD (chronic obstructive pulmonary disease) ([facility])   . Depression   . Hydrocephalus ([facility])     SOCIAL HX:  Social History   Tobacco Use  . Smoking status: Current Every Day Smoker    Packs/day: 1.00    Years: 40.00    Pack years: 40.00    Types: Cigarettes  . Smokeless tobacco: Never Used  . Tobacco comment: Informed patient that we can give him materials when he is ready to quit.  Substance Use Topics  . Alcohol use: No    ALLERGIES:  Allergies  Allergen Reactions  . Acetaminophen Other (See Comments)    Syncope  . Percocet [Oxycodone-Acetaminophen]      PERTINENT MEDICATIONS:  Outpatient Encounter Medications as of 06/09/2019  Medication Sig  . albuterol (PROVENTIL) (2.5 MG/3ML) 0.083% nebulizer solution USE ONE VIAL IN NEBULIZER EVERY SIX HOURS AS NEEDED FOR WHEEZING OR SHORTNESS OF BREATH  . albuterol (VENTOLIN HFA) 108 (90 Base) MCG/ACT inhaler INHALE 2 PUFFS INTO THE LUNGS EVERY 4 HOURS AS NEEDED FOR WHEEZING OR SHORTNESS OF BREATH  . busPIRone (BUSPAR) 10 MG tablet Take 1 tablet (10 mg total) by mouth 2 (two) times daily.  . Fluticasone-Salmeterol (ADVAIR DISKUS) 250-50 MCG/DOSE AEPB Inhale 1 puff into  the lungs 2 (two) times daily.  . montelukast (SINGULAIR) 10 MG tablet Take 1 tablet (10 mg total) by mouth at bedtime.  [name] Kitchen omeprazole (PRILOSEC) 20 MG capsule TAKE 1 CAPSULE BY MOUTH EVERY DAY  . predniSONE (DELTASONE) 10 MG tablet Take 10 mg by mouth daily with breakfast.  . rOPINIRole (REQUIP) 0.5 MG tablet TAKE ONE TABLET EVERY MORNING AND TAKE THREE TABLETS AT BEDTIME AS NEEDED FOR RESTLESS LEG SYNDROME  . sertraline (ZOLOFT) 100 MG tablet Take 1 tablet (100 mg total) by mouth daily.  . sertraline (ZOLOFT) 50 MG tablet Take 1 tablet (50 mg total) by mouth daily. To  take with 100 MG tablet to = 150 MG total daily.  [name] Kitchen SPIRIVA HANDIHALER 18 MCG inhalation capsule INHALE CONTENTS OF 1 CAPSULE AS DIRECTEDEVERY DAY WITH HANDIHALER  . [DISCONTINUED] albuterol (PROVENTIL HFA;VENTOLIN HFA) 108 (90 Base) MCG/ACT inhaler Inhale 2 puffs into the lungs every 4 (four) hours as needed for wheezing or shortness of breath.  . [DISCONTINUED] albuterol (VENTOLIN HFA) 108 (90 Base) MCG/ACT inhaler INHALE 2 PUFFS INTO THE LUNGS EVERY 4 HOURS AS NEEDED FOR WHEEZING OR SHORTNESS OF BREATH  . [DISCONTINUED] rOPINIRole (REQUIP) 0.5 MG tablet TAKE ONE TABLET BY MOUTH EVERY MORNING AND 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   No facility-administered encounter medications on file as of 06/09/2019.     Horace Lukas Jenetta Downer, NP

## 2019-06-13 ENCOUNTER — Other Ambulatory Visit: Payer: Self-pay | Admitting: Specialist

## 2019-06-13 DIAGNOSIS — R911 Solitary pulmonary nodule: Secondary | ICD-10-CM

## 2019-06-15 ENCOUNTER — Telehealth: Payer: Self-pay | Admitting: Nurse Practitioner

## 2019-06-15 MED ORDER — DULOXETINE HCL 30 MG PO CPEP: ORAL_CAPSULE | ORAL | 3 refills | Status: DC

## 2019-06-15 MED ORDER — SERTRALINE HCL 100 MG PO TABS: ORAL_TABLET | ORAL | 3 refills | Status: DC

## 2019-06-15 NOTE — Telephone Encounter (Signed)
Collaborated with CCM and palliative team in regard to patient chronic pain.  Plan is to reduce Sertraline to 100 MG for 2 weeks and bring in Duloxetine 30 MG, then in two weeks will further reduce Sertraline to 50 MG and increase Duloxetine to 60 MG.  If any s/s of serotonin syndrome in future then would either discontinue/reduce Zoloft or cut Buspar dose in 1/2.  If acute loss of mood benefit with reduction of Zoloft and initiation of Duloxetine then will discontinue Duloxetine and re-evaluate, consider Gabapentin.  Discussed with patient via telephone and he agrees with plan of care.

## 2019-06-22 ENCOUNTER — Other Ambulatory Visit: Payer: Self-pay | Admitting: Nurse Practitioner

## 2019-06-22 ENCOUNTER — Other Ambulatory Visit: Payer: Self-pay

## 2019-06-22 ENCOUNTER — Other Ambulatory Visit: Payer: Medicare Other

## 2019-06-22 DIAGNOSIS — Z515 Encounter for palliative care: Secondary | ICD-10-CM

## 2019-06-22 NOTE — Progress Notes (Signed)
COMMUNITY PALLIATIVE CARE SW NOTE  PATIENT NAME: Austin Blevins DOB: [birth year] MRN: [id number]  PRIMARY CARE PROVIDER: [name], NP  RESPONSIBLE PARTY:  Acct ID - Guarantor Home Phone Work Phone Relationship Acct Type  [id number] Daryel Gerald* [phone]  Self P/F     [address] [place], [place] [address]     PLAN OF CARE and INTERVENTIONS:             1. GOALS OF CARE/ ADVANCE CARE PLANNING:  Patient is DNR. Goal is to maintain independence and sleep better at night.  2. SOCIAL/EMOTIONAL/SPIRITUAL ASSESSMENT/ INTERVENTIONS:  SW completed TELEHEALTH visit with patient. Patient reports that he is doing "okay". Patient noted that he has been coughing more, but did see pulmonologist on 2/16 and said they are aware and following. Patient said he is eating well. Patient noted he is not sleeping well, and only short periods of time when he does sleep. Patient reports that he also experiences vivid dreams when he sleeps. Patient asked about adding medication to assist with sleep. SW notified NP. Patient reflected on his life and changes from being active to having increased limitations. SW provided emotional support, validated feelings, discussed goals of care, and used active and reflective listening. 3. PATIENT/CAREGIVER EDUCATION/ COPING:  Patient was alert, engaged. Patient openly expressed feelings. Patient seemed more cheerful in conversation today. Patient said he is focusing on what he can control and taking it one day at a time. Discussed coping skills. Patient continues to go for short walks, enjoys crosswords, prays and read his Bible. Family is supportive. Patient also noted that he appreciates palliative care support. 4. PERSONAL EMERGENCY PLAN:  Patient/family will call 9-1-1 for emergencies. 5. COMMUNITY RESOURCES COORDINATION/ HEALTH CARE NAVIGATION:  Patient is scheduled for CT on 2/11 and PCP follow-up on 2/15. 6. FINANCIAL/LEGAL CONCERNS/INTERVENTIONS:  Patient receives SSDI.  Patient does receive food stamps. Patient has Medicaid coverage.     SOCIAL HX:  Social History   Tobacco Use  . Smoking status: Current Every Day Smoker    Packs/day: 1.00    Years: 40.00    Pack years: 40.00    Types: Cigarettes  . Smokeless tobacco: Never Used  . Tobacco comment: Informed patient that we can give him materials when he is ready to quit.  Substance Use Topics  . Alcohol use: No    CODE STATUS:   Code Status: Prior (DNR) ADVANCED DIRECTIVES: N MOST FORM COMPLETE:  No. HOSPICE EDUCATION PROVIDED: None.  PPS: Patient is independent of ADLs.  Due to the COVID-19 , this visit was done viatelephonefrom my office and it was initiated and consent by this patient and/or family.This was a scheduled visit.  I spent44minutes with patient/family, from3:00-3:30pproviding education, support and consultation.  Richrd Sox, LCSW

## 2019-06-23 ENCOUNTER — Other Ambulatory Visit: Payer: Self-pay

## 2019-06-23 ENCOUNTER — Other Ambulatory Visit: Payer: Medicare Other | Admitting: Adult Health Nurse Practitioner

## 2019-06-23 DIAGNOSIS — Z515 Encounter for palliative care: Secondary | ICD-10-CM

## 2019-06-23 DIAGNOSIS — J449 Chronic obstructive pulmonary disease, unspecified: Secondary | ICD-10-CM

## 2019-06-24 NOTE — Progress Notes (Signed)
Therapist, nutritional Palliative Care Consult Note Telephone: [phone]  Fax: [phone]  PATIENT NAME: Austin Blevins DOB: 12/14/1960 MRN: [id number]  PRIMARY CARE PROVIDER:   [name], NP  REFERRING PROVIDER:  [name], NP [address] [place],  [place] [address]  RESPONSIBLE PARTY:   Self [phone]  Due to the COVID-19 crisis, this visit was done via telemedicine and it was initiated and consent by this patient and or family. Video-audio (telehealth) contact was unable to be done due to technical barriers from the patient's side.    RECOMMENDATIONS and PLAN:  1.  Advanced care planning. Patient is a DNR.  2.  Pain.  PCP added Cymbalta and decreased dosage of zoloft.  Patient states that his pain is improving.  Denies any increased depression or symptoms of serotonin syndrome.  Does state that he has not had any wild dreams in the past 2 nights.  Continue current dosage of Cymbalta, zoloft and buspar as patient is having relief of symptoms without increased depression or side effects.    3.  Insomnia. States he has had problems falling asleep and staying asleep for years.  States that he does have daytime sleepiness and will take naps during the day.  Has not tried anything for the insomnia.  Have suggested trying melatonin 3 mg OTC. Suggested starting at 1tab and increasing to 2 or 3 tabs if needed for effectiveness.  Have appointment in 2 weeks to monitor for effectiveness.  Patient did have questions about [employer] One portable oxygen concentrator.  Stated that he is looking into it and did suggest that he reach out to his insurance company to make sure it is covered.  Currently gets his oxygen supplies through [employer].    I spent 30 minutes providing this consultation, including time with patient/family, chart review, provider coordination, and documentation . More than 50% of the time in this consultation was spent coordinating communication.    HISTORY OF PRESENT ILLNESS:  Austin Blevins is a 59 y.o. year old male with multiple medical problems including severeCOPD, GERD, nicotine dependence, depression, restless leg syndrome, persistent cough, shortness of breath. Palliative Care was asked to help address goals of care.   CODE STATUS: DNR  PPS: 60% HOSPICE ELIGIBILITY/DIAGNOSIS: TBD  PHYSICAL EXAM:   Deferred  PAST MEDICAL HISTORY:  Past Medical History:  Diagnosis Date  . Anxiety   . COPD (chronic obstructive pulmonary disease) ([facility])   . Depression   . Hydrocephalus ([facility])     SOCIAL HX:  Social History   Tobacco Use  . Smoking status: Current Every Day Smoker    Packs/day: 1.00    Years: 40.00    Pack years: 40.00    Types: Cigarettes  . Smokeless tobacco: Never Used  . Tobacco comment: Informed patient that we can give him materials when he is ready to quit.  Substance Use Topics  . Alcohol use: No    ALLERGIES:  Allergies  Allergen Reactions  . Acetaminophen Other (See Comments)    Syncope  . Percocet [Oxycodone-Acetaminophen]      PERTINENT MEDICATIONS:  Outpatient Encounter Medications as of 06/23/2019  Medication Sig  . albuterol (PROVENTIL) (2.5 MG/3ML) 0.083% nebulizer solution USE ONE VIAL IN NEBULIZER EVERY SIX HOURS AS NEEDED FOR WHEEZING OR SHORTNESS OF BREATH  . albuterol (VENTOLIN HFA) 108 (90 Base) MCG/ACT inhaler INHALE 2 PUFFS INTO THE LUNGS EVERY 4 HOURS AS NEEDED FOR WHEEZING OR SHORTNESS OF BREATH  .  busPIRone (BUSPAR) 10 MG tablet TAKE ONE TABLET TWICE DAILY  . DULoxetine (CYMBALTA) 30 MG capsule Take one tablet (30 MG) by mouth daily x 2 weeks and then increase to two tablets (60 MG) by mouth daily.  . Fluticasone-Salmeterol (ADVAIR DISKUS) 250-50 MCG/DOSE AEPB Inhale 1 puff into the lungs 2 (two) times daily.  . montelukast (SINGULAIR) 10 MG tablet Take 1 tablet (10 mg total) by mouth at bedtime.  [name] Kitchen omeprazole (PRILOSEC) 20 MG capsule TAKE 1 CAPSULE BY MOUTH EVERY DAY  .  predniSONE (DELTASONE) 10 MG tablet Take 10 mg by mouth daily with breakfast.  . rOPINIRole (REQUIP) 0.5 MG tablet TAKE ONE TABLET EVERY MORNING AND TAKE THREE TABLETS AT BEDTIME AS NEEDED FOR RESTLESS LEG SYNDROME  . sertraline (ZOLOFT) 100 MG tablet Take 100 MG by mouth daily for 2 weeks and then decrease to 1/2 tablet (50 MG) by mouth daily.  [name] Kitchen SPIRIVA HANDIHALER 18 MCG inhalation capsule INHALE CONTENTS OF 1 CAPSULE AS DIRECTEDEVERY DAY WITH HANDIHALER  . [DISCONTINUED] albuterol (PROVENTIL HFA;VENTOLIN HFA) 108 (90 Base) MCG/ACT inhaler Inhale 2 puffs into the lungs every 4 (four) hours as needed for wheezing or shortness of breath.  . [DISCONTINUED] albuterol (VENTOLIN HFA) 108 (90 Base) MCG/ACT inhaler INHALE 2 PUFFS INTO THE LUNGS EVERY 4 HOURS AS NEEDED FOR WHEEZING OR SHORTNESS OF BREATH  . [DISCONTINUED] rOPINIRole (REQUIP) 0.5 MG tablet TAKE ONE TABLET BY MOUTH EVERY MORNING AND 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   No facility-administered encounter medications on file as of 06/23/2019.      Dung Prien Jenetta Downer, NP

## 2019-06-29 ENCOUNTER — Ambulatory Visit
Admission: RE | Admit: 2019-06-29 | Discharge: 2019-06-29 | Disposition: A | Discharge status: Home / Self Care | Payer: Medicare Other | Source: Ambulatory Visit | Attending: Specialist | Admitting: Specialist

## 2019-06-29 ENCOUNTER — Other Ambulatory Visit: Payer: Self-pay

## 2019-06-29 DIAGNOSIS — R911 Solitary pulmonary nodule: Secondary | ICD-10-CM | POA: Insufficient documentation

## 2019-07-03 ENCOUNTER — Ambulatory Visit (INDEPENDENT_AMBULATORY_CARE_PROVIDER_SITE_OTHER): Payer: Medicare Other | Admitting: Nurse Practitioner

## 2019-07-03 ENCOUNTER — Encounter: Payer: Self-pay | Admitting: Nurse Practitioner

## 2019-07-03 DIAGNOSIS — J449 Chronic obstructive pulmonary disease, unspecified: Secondary | ICD-10-CM

## 2019-07-03 DIAGNOSIS — F17219 Nicotine dependence, cigarettes, with unspecified nicotine-induced disorders: Secondary | ICD-10-CM | POA: Diagnosis not present

## 2019-07-03 DIAGNOSIS — F331 Major depressive disorder, recurrent, moderate: Secondary | ICD-10-CM

## 2019-07-03 MED ORDER — SERTRALINE HCL 100 MG PO TABS: 150.0000 mg | ORAL_TABLET | Freq: Every day | ORAL | 3 refills | Status: DC

## 2019-07-03 NOTE — Progress Notes (Signed)
There were no vitals taken for this visit.   Subjective:    Patient ID: Austin Blevins, male    DOB: 10/13/1960, 59 y.o.   MRN: [id number]  HPI: Austin Blevins is a 59 y.o. male  Chief Complaint  Patient presents with  . Depression    6 week f/up    . This visit was completed via telephone due to the restrictions of the COVID-19 pandemic. All issues as above were discussed and addressed but no physical exam was performed. If it was felt that the patient should be evaluated in the office, they were directed there. The patient verbally consented to this visit. Patient was unable to complete an audio/visual visit due to Lack of equipment. Due to the catastrophic nature of the COVID-19 pandemic, this visit was done through audio contact only. . Location of the patient: home . Location of the provider: work . Those involved with this call:  . Provider: Aura Dials, DNP . CMA: Wilhemena Durie, CMA . Front Desk/Registration: Adela Ports  . Time spent on call: 15 minutes on the phone discussing health concerns. 10 minutes total spent in review of patient's record and preparation of their chart.  . I verified patient identity using two factors (patient name and date of birth). Patient consents verbally to being seen via telemedicine visit today.    DEPRESSION Transitioning to Duloxetine from Sertraline due to chronic pain with COPD and mood.  Reports he is having more vivid dreams with Cymbalta, some where he is falling out of his hospital bed.  Feels his mood was better with Sertraline.   Mood status: stable Satisfied with current treatment?: yes Symptom severity: moderate  Duration of current treatment : chronic Side effects: no Medication compliance: good compliance Psychotherapy/counseling: none Previous psychiatric medications: Duloxetine and Sertraline Depressed mood: occasional Anxious mood: no Anhedonia: no Significant weight loss or gain: no Insomnia: yes hard to  fall asleep Fatigue: no Feelings of worthlessness or guilt: no Impaired concentration/indecisiveness: yes Suicidal ideations: no Hopelessness: no Crying spells: no Depression screen [facility] 2/9 07/03/2019 05/30/2019 04/28/2019 03/24/2019 03/13/2019  Decreased Interest 3 3 3 3  0  Down, Depressed, Hopeless 2 2 2 2 3   PHQ - 2 Score 5 5 5 5 3   Altered sleeping 3 3 3 3 2   Tired, decreased energy 3 3 3 3 3   Change in appetite 1 3 2 1 3   Feeling bad or failure about yourself  2 2 1 1 3   Trouble concentrating 2 2 1 3 3   Moving slowly or fidgety/restless 1 1 1 2 2   Suicidal thoughts 1 1 1 1  0  PHQ-9 Score 18 20 17 19 19   Difficult doing work/chores Extremely dIfficult Somewhat difficult Very difficult Somewhat difficult Very difficult  Some recent data might be hidden   COPD Reports being seen by pulmonary today with good results at visit.  Recent CT on 06/29/2019, showed possible resolution of previous PNA.  No changes made to regimen at pulmonary today.  Continues on Spiriva, PRN Albuterol, and Singulair.  Is O2 dependent.  Pulmonary prescribed Hycodan for chronic cough today.  Does continue to smoke about 1/2 PPD. COPD status: stable Satisfied with current treatment?: yes Oxygen use: yes Dyspnea frequency: baseline Cough frequency: baseline Rescue inhaler frequency:  2 times a day  Limitation of activity: no Productive cough: none Last Spirometry: with pulmonary Pneumovax: Up to Date Influenza: Up to Date  Relevant past medical, surgical, family and social history reviewed and updated  as indicated. Interim medical history since our last visit reviewed. Allergies and medications reviewed and updated.  Review of Systems  Constitutional: Negative for activity change, diaphoresis, fatigue and fever.  Respiratory: Positive for cough (baseline), shortness of breath (baseline) and wheezing (baseline). Negative for chest tightness.   Cardiovascular: Negative for chest pain, palpitations and leg  swelling.  Gastrointestinal: Negative.   Endocrine: Negative.   Musculoskeletal: Positive for arthralgias.  Neurological: Negative.   Psychiatric/Behavioral: Positive for decreased concentration and sleep disturbance. Negative for self-injury and suicidal ideas.    Per HPI unless specifically indicated above     Objective:    There were no vitals taken for this visit.  Wt Readings from Last 3 Encounters:  03/24/19 120 lb (54.4 kg)  03/13/19 120 lb 9.6 oz (54.7 kg)  10/24/18 120 lb (54.4 kg)    Physical Exam   Unable to perform due to telephone visit only.  Results for orders placed or performed during the hospital encounter of 04/10/19  I-STAT creatinine  Result Value Ref Range   Creatinine, Ser 1.20 0.61 - 1.24 mg/dL      Assessment & Plan:   Problem List Items Addressed This Visit      Respiratory   Stage 3 severe COPD by GOLD classification ([place])    Chronic, ongoing, severe with O2 dependence. Continue current medication and collaboration with pulmonary, palliative, and CCM team.        Relevant Medications   HYDROcodone-homatropine (HYCODAN) 5-1.5 MG/5ML syrup     Nervous and Auditory   Nicotine dependence, cigarettes, w unsp disorders    I have recommended complete cessation of tobacco use. I have discussed various options available for assistance with tobacco cessation including over the counter methods (Nicotine gum, patch and lozenges). We also discussed prescription options (Chantix, Nicotine Inhaler / Nasal Spray). The patient is not interested in pursuing any prescription tobacco cessation options at this time.         Other   Depression, major, recurrent, moderate ([place]) - Primary    Chronic, ongoing, continues to be exacerbated by his severe COPD and recent grieving. Poor tolerance of Cymbalta, which was tried for mood and chronic pain.  Will discontinue Cymbalta.  Return to Sertraline 150 MG daily (new script sent) and continue Buspar 10 MG BID, he  reports these work well.  He denies SI/HI.   Return in 4 weeks.      Relevant Medications   sertraline (ZOLOFT) 100 MG tablet      I discussed the assessment and treatment plan with the patient. The patient was provided an opportunity to ask questions and all were answered. The patient agreed with the plan and demonstrated an understanding of the instructions.   The patient was advised to call back or seek an in-person evaluation if the symptoms worsen or if the condition fails to improve as anticipated.   I provided 15 minutes of time during this encounter.  Follow up plan: Return in about 4 weeks (around 07/31/2019) for Mood.

## 2019-07-03 NOTE — Assessment & Plan Note (Signed)
I have recommended complete cessation of tobacco use. I have discussed various options available for assistance with tobacco cessation including over the counter methods (Nicotine gum, patch and lozenges). We also discussed prescription options (Chantix, Nicotine Inhaler / Nasal Spray). The patient is not interested in pursuing any prescription tobacco cessation options at this time.  

## 2019-07-03 NOTE — Assessment & Plan Note (Signed)
Chronic, ongoing, severe with O2 dependence. Continue current medication and collaboration with pulmonary, palliative, and CCM team.

## 2019-07-03 NOTE — Assessment & Plan Note (Signed)
Chronic, ongoing, continues to be exacerbated by his severe COPD and recent grieving. Poor tolerance of Cymbalta, which was tried for mood and chronic pain.  Will discontinue Cymbalta.  Return to Sertraline 150 MG daily (new script sent) and continue Buspar 10 MG BID, he reports these work well.  He denies SI/HI.   Return in 4 weeks.

## 2019-07-03 NOTE — Patient Instructions (Signed)

## 2019-07-04 ENCOUNTER — Ambulatory Visit (INDEPENDENT_AMBULATORY_CARE_PROVIDER_SITE_OTHER): Payer: Medicare Other | Admitting: Pharmacist

## 2019-07-04 DIAGNOSIS — F331 Major depressive disorder, recurrent, moderate: Secondary | ICD-10-CM

## 2019-07-04 DIAGNOSIS — J449 Chronic obstructive pulmonary disease, unspecified: Secondary | ICD-10-CM

## 2019-07-04 DIAGNOSIS — G2581 Restless legs syndrome: Secondary | ICD-10-CM

## 2019-07-04 NOTE — Chronic Care Management (AMB) (Signed)
Chronic Care Management   Note  07/04/2019 Name: DAICHI MORIS MRN: [id number] DOB: 1961/03/15   Subjective:  LANEY BAGSHAW is a 59 y.o. year old male who is a primary care patient of [name], [name], NP. The CCM team was consulted for assistance with chronic disease management and care coordination needs.    Contacted patient for medication management review. Discussed CCM consent, as he now has Medicare.    Mr. Dermody was given information about Chronic Care Management services today including:  1. CCM service includes personalized support from designated clinical staff supervised by his physician, including individualized plan of care and coordination with other care providers 2. 24/7 contact phone numbers for assistance for urgent and routine care needs. 3. Service will only be billed when office clinical staff spend 20 minutes or more in a month to coordinate care. 4. Only one practitioner may furnish and bill the service in a calendar month. 5. The patient may stop CCM services at any time (effective at the end of the month) by phone call to the office staff. 6. The patient will be responsible for cost sharing (co-pay) of up to 20% of the service fee (after annual deductible is met).  Patient agreed to services and verbal consent obtained.   Review of patient status, including review of consultants reports, laboratory and other test data, was performed as part of comprehensive evaluation and provision of chronic care management services.   SDOH (Social Determinants of Health) screening performed today: Depression  . See Care Plan for related entries.   Objective:  Lab Results  Component Value Date   CREATININE 1.20 04/10/2019   CREATININE 1.09 10/24/2018   CREATININE 1.08 04/28/2018    Lab Results  Component Value Date   HGBA1C 5.6 10/24/2018       Component Value Date/Time   CHOL 168 08/12/2017 1607   TRIG 90 08/12/2017 1607   HDL 32 (L) 08/12/2017 1607   CHOLHDL  5.3 (H) 08/12/2017 1607   LDLCALC 118 (H) 08/12/2017 1607    Clinical ASCVD: No  The 10-year ASCVD risk score Mikey Bussing DC Jr., et al., 2013) is: 13.5%   Values used to calculate the score:     Age: 44 years     Sex: Male     Is Non-Hispanic African American: No     Diabetic: No     Tobacco smoker: Yes     Systolic Blood Pressure: 491 mmHg     Is BP treated: No     HDL Cholesterol: 32 mg/dL     Total Cholesterol: 168 mg/dL    BP Readings from Last 3 Encounters:  02/20/19 (!) 136/95  12/21/18 125/85  10/24/18 123/81    Allergies  Allergen Reactions  . Acetaminophen Other (See Comments)    Syncope  . Percocet [Oxycodone-Acetaminophen]     Medications Reviewed Today    Reviewed by De Hollingshead, [facility] (Pharmacist) on 07/04/19 at [phone]  Med List Status: <None>  Medication Order Taking? Sig Documenting Provider Last Dose Status Informant        Discontinued 02/23/19 2045 (Reorder)            Med Note ([place] Dec 13, 2018 10:47 AM) Using 4x day  albuterol (PROVENTIL) (2.5 MG/3ML) 0.083% nebulizer solution [id number] Yes USE ONE VIAL IN NEBULIZER EVERY SIX HOURS AS NEEDED FOR WHEEZING OR SHORTNESS OF BREATH [name], [name], NP Taking Active  Discontinued 05/25/19 1742 (Reorder)   albuterol (VENTOLIN HFA) 108 (90 Base) MCG/ACT inhaler [id number] Yes INHALE 2 PUFFS INTO THE LUNGS EVERY 4 HOURS AS NEEDED FOR WHEEZING OR SHORTNESS OF BREATH [name], NP Taking Active            Med Note De Hollingshead   Tue Jul 04, 2019  9:38 AM) Using at least QID  busPIRone (BUSPAR) 10 MG tablet [id number] Yes TAKE ONE TABLET TWICE DAILY [name], Jolene T, NP Taking Active   Fluticasone-Salmeterol (ADVAIR DISKUS) 250-50 MCG/DOSE AEPB [id number] Yes Inhale 1 puff into the lungs 2 (two) times daily. Marnee Guarneri T, NP Taking Active   HYDROcodone-homatropine [facility]) 5-1.5 MG/5ML syrup [id number] Yes Take 5 mLs by mouth every 8 (eight) hours as needed. [provider] Taking Active   montelukast (SINGULAIR) 10 MG tablet [id number] Yes Take 1 tablet (10 mg total) by mouth at bedtime. Marnee Guarneri T, NP Taking Active   omeprazole (PRILOSEC) 20 MG capsule [id number] Yes TAKE 1 CAPSULE BY MOUTH EVERY DAY [name], Jolene T, NP Taking Active   predniSONE (DELTASONE) 10 MG tablet [id number] Yes Take 10 mg by mouth daily with breakfast. [provider] Taking Active         Discontinued 05/25/19 0545 (Reorder)   rOPINIRole (REQUIP) 0.5 MG tablet [id number] Yes TAKE ONE TABLET EVERY MORNING AND TAKE THREE TABLETS AT BEDTIME AS NEEDED FOR RESTLESS LEG SYNDROME Marnee Guarneri T, NP Taking Active   sertraline (ZOLOFT) 100 MG tablet [id number] Yes Take 1.5 tablets (150 mg total) by mouth daily. Marnee Guarneri T, NP Taking Active   SPIRIVA HANDIHALER 18 MCG inhalation capsule [id number] Yes INHALE CONTENTS OF 1 CAPSULE AS [place], Jolene T, NP Taking Active            Assessment:   Goals Addressed            This Visit's Progress     Patient Stated   . PharmD "I have a lot of medications" (pt-stated)       Current Barriers:  . Polypharmacy - complex patient with multiple chronic conditions including COPD, tobacco abuse, depression, RLS . Receives medications in pill packages from [facility].  o COPD: follows w/ palliative care, pulmonology at [facility]. Appt w/ Dr. Raul Del yesterday. Continues Advair 250/50 mcg BID + Spiriva 18 mcg daily, albuterol HFA or neb PRN; prednisone 10 mg daily. Notes that he needs a new nebulizer machine; the one he has was bought at a yardsale o Depression/chronic pain: trialed duloxetine, but developed vivid dreams. Yesterday, PCP switched back to sertraline 150 mg daily. Continues buspirone 10 mg BID o RLS: ropinirole 0.5 mg QAM, 1.5 mg QPM o GERD: omeprazole 20 mg QAM o Insomnia: patient notes this continues to be a problem. Palliative Care recommended he try melatonin; he  notes he has not purchased this yet o Tobacco abuse; notes that he is not interested in smoking cessation at this point.  Pharmacist Clinical Goal(s):  [name] Kitchen Over the next 90 days, patient will work with PharmD and PCP to address needs related to optmized medication management  Interventions: . Comprehensive medication review performed, medication list updated in electronic medical record . Contacted [facility] to review pill package contents. Currently filling: AM: omeprazole 20 mg, prednisone 10 mg, ropinirole 0.5 mg, buspirone 10 mg; PM: Sertraline 50 mg, montelukast 10 mg, ropinirole 1.5 mg, buspirone 10 mg, duloxetine 30 mg; They noted they would cancel duloxetine and  update sertraline dosing on next pill pack fill . Counseled patient on safety of melatonin over other sedating options, particularly given his lung disease. Discussed need to trial for 1-2 weeks every evening to determine benefit. He notes he will purchase some to try . Counseled on progression of lung disease, and potential eventual progression to nebulizer therapy, if he gets to the point that he no longer has inspiratory capacity for dry powder inhalers. Will collaborate w/ RN CM on getting a new nebulizer machine, now that he has Medicare  Patient Self Care Activities:  . Self administers medications as prescribed  Please see past updates related to this goal by clicking on the "Past Updates" button in the selected goal         Plan: - Scheduled f/u call 08/23/19  Catie Darnelle Maffucci, PharmD, [place] [phone]

## 2019-07-04 NOTE — Patient Instructions (Signed)
Visit Information  Goals Addressed            This Visit's Progress     Patient Stated   . PharmD "I have a lot of medications" (pt-stated)       Current Barriers:  . Polypharmacy - complex patient with multiple chronic conditions including COPD, tobacco abuse, depression, RLS . Receives medications in pill packages from [facility].  o COPD: follows w/ palliative care, pulmonology at [facility]. Appt w/ Dr. Meredeth Ide yesterday. Continues Advair 250/50 mcg BID + Spiriva 18 mcg daily, albuterol HFA or neb PRN; prednisone 10 mg daily. Notes that he needs a new nebulizer machine; the one he has was bought at a yardsale o Depression/chronic pain: trialed duloxetine, but developed vivid dreams. Yesterday, PCP switched back to sertraline 150 mg daily. Continues buspirone 10 mg BID o RLS: ropinirole 0.5 mg QAM, 1.5 mg QPM o GERD: omeprazole 20 mg QAM o Insomnia: patient notes this continues to be a problem. Palliative Care recommended he try melatonin; he notes he has not purchased this yet o Tobacco abuse; notes that he is not interested in smoking cessation at this point.  Pharmacist Clinical Goal(s):  [name] Kitchen Over the next 90 days, patient will work with PharmD and PCP to address needs related to optmized medication management  Interventions: . Comprehensive medication review performed, medication list updated in electronic medical record . Contacted [facility] to review pill package contents. Currently filling: AM: omeprazole 20 mg, prednisone 10 mg, ropinirole 0.5 mg, buspirone 10 mg; PM: Sertraline 50 mg, montelukast 10 mg, ropinirole 1.5 mg, buspirone 10 mg, duloxetine 30 mg; They noted they would cancel duloxetine and update sertraline dosing on next pill pack fill . Counseled patient on safety of melatonin over other sedating options, particularly given his lung disease. Discussed need to trial for 1-2 weeks every evening to determine benefit. He notes he will purchase some to  try . Counseled on progression of lung disease, and potential eventual progression to nebulizer therapy, if he gets to the point that he no longer has inspiratory capacity for dry powder inhalers. Will collaborate w/ RN CM on getting a new nebulizer machine, now that he has Medicare  Patient Self Care Activities:  . Self administers medications as prescribed  Please see past updates related to this goal by clicking on the "Past Updates" button in the selected goal         The patient verbalized understanding of instructions provided today and declined a print copy of patient instruction materials.   Plan: - Scheduled f/u call 08/23/19  Catie Feliz Beam, PharmD, [facility] Clinical Pharmacist [facility] [phone]

## 2019-07-06 ENCOUNTER — Other Ambulatory Visit: Payer: Self-pay

## 2019-07-06 ENCOUNTER — Ambulatory Visit: Payer: Self-pay | Admitting: General Practice

## 2019-07-06 ENCOUNTER — Other Ambulatory Visit: Payer: Medicare Other | Admitting: Adult Health Nurse Practitioner

## 2019-07-06 ENCOUNTER — Telehealth: Payer: Self-pay

## 2019-07-06 DIAGNOSIS — F331 Major depressive disorder, recurrent, moderate: Secondary | ICD-10-CM | POA: Diagnosis not present

## 2019-07-06 DIAGNOSIS — J449 Chronic obstructive pulmonary disease, unspecified: Secondary | ICD-10-CM | POA: Diagnosis not present

## 2019-07-06 DIAGNOSIS — Z515 Encounter for palliative care: Secondary | ICD-10-CM

## 2019-07-06 DIAGNOSIS — F4321 Adjustment disorder with depressed mood: Secondary | ICD-10-CM | POA: Diagnosis not present

## 2019-07-06 NOTE — Telephone Encounter (Signed)
At the request of Amy NP, PCP office contacted to provide update on concerns voiced by patient during telehealth visit today.

## 2019-07-06 NOTE — Chronic Care Management (AMB) (Signed)
Chronic Care Management   Follow Up Note   07/06/2019 Name: Austin Blevins MRN: [id number] DOB: June 02, 1960  Referred by: [name], NP Reason for referral : Chronic Care Management (Assistance with DME needs: Chronic health conditions)   DAMAREE SARGENT is a 59 y.o. year old male who is a primary care patient of [name], [name], NP. The care management team was consulted for assistance with care management and care coordination needs.    Review of patient status, including review of consultants reports, relevant laboratory and other test results, and collaboration with appropriate care team members and the patient's provider was performed as part of comprehensive patient evaluation and provision of chronic care management services.    SDOH (Social Determinants of Health) assessments performed: Yes    Advanced Directives: See Care Plan and Vynca application for related entries.   Goals Addressed            This Visit's Progress    RNCM: I now would like to get the rails for my bed       CARE PLAN ENTRY (see longtitudinal plan of care for additional care plan information)  Current Barriers:   Knowledge Deficits related to how to obtain bed rails to go on his hospital bed due to chronic disease processes   Lacks caregiver support.   Corporate treasurer.   Nurse Case Manager Clinical Goal(s):   Over the next 90 days, patient will verbalize understanding of plan for obtaining bed rails for his hospital bed her uses in home due to his chronic health  conditions  Over the next 90 days, patient will work with [facility] and CCM team to address needs related to DME needs   Over the next 90 days, patient will demonstrate a decrease in COPD exacerbations as evidenced by taking medications as prescribed, following the poc, and having needed DME to remain safe in the home environment  Over the next 90 days, patient will work with CM clinical social worker to for depression and  other low income help  Over the next 90 days, patient will work with care guides  (community agency) to food resources and help  Interventions:   Evaluation of current treatment plan related to the need now for the patient to have rails on his hospital bed for safety and patient's adherence to plan as established by provider.  Provided education to patient re: discussed that the [facility] Supply will call and they will have someone come out and place the rails on the bed for the patient  Collaborated with [employer] and the interdisciplinary team  regarding the patients request for rails to be placed on his hospital bed  Discussed plans with patient for ongoing care management follow up and provided patient with direct contact information for care management team  Care Guide referral for food resources in the community  Social Work referral for depression and LIS information  RNCM contacted the [employer] supply company and spoke to a respresentative who states that there was no need for an additional order. They have the information they need and will send someone to the home to install the rails on the hospital bed and instruct the patient how to  use   Patient Self Care Activities:   Patient verbalizes understanding of plan to have the [employer] company come in and install the bed rails on his hospital bed  Attends all scheduled provider appointments  Calls provider office for new concerns  or questions  Unable to independently get rails for his hospital bed as evidence of not know what DME company supplied hospital bed   Initial goal documentation         The care management team will reach out to the patient again over the next 30 to 60 days.   Noreene Larsson RN, MSN, [facility] Mobile: [phone]

## 2019-07-06 NOTE — Progress Notes (Signed)
Therapist, nutritional Palliative Care Consult Note Telephone: [phone]  Fax: [phone]  PATIENT NAME: Austin Blevins DOB: December 04, 1960 MRN: [id number]  PRIMARY CARE PROVIDER:   [name], NP  REFERRING PROVIDER:  [name], NP [address] [place],  [place] [address]  RESPONSIBLE PARTY:   Self [phone]  Due to the COVID-19 crisis, this visit was done via telemedicine and it was initiated and consent by this patient and or family. Video-audio (telehealth) contact was unable to be done due to technical barriers from the patient's side.    RECOMMENDATIONS and PLAN:  1.  Advanced care planning. Patient is a DNR.  2.  Depression.  Patient was started on Cymbalta for depression and pain and first was seeing some improvement.  States that he did start having bad dreams and had fallen out of bed five times.  The Cymbalta was stopped and his sertraline increased back to 150 mg daily.  He also has buspar 10 mg BID.  He states that he no longer has the bad dreams.  Continue current depression medication and monitor for worsening symptoms.  Patient is inquiring about rails for his hospital bed since he has fallen out of bed.  Does not remember the medical supply company that brought the bed.  RN clinical navigator reached out to SW at PCP office for help getting the bedrails.    3.  COPD.  Was started on hycodan for cough.  States it does give him some relief.  States that he is out of it and he has also misplaced his rescue inhaler.  Have advised him to reach out to prescribing provider and/or pharmacy to have these refilled.  He otherwise states that he is at baseline with his breathing.  Uses O2 @ 3L and will increase with activity.  Continue follow up and recommendations by pulmonology.  4.  Support.  Patient stated that his siblings help bring him groceries and with meal prep when they can.  At times has a neighbor that will bring extra food she prepares.   Due to his DOE he has problems preparing meals. States that it is difficult for him to go grocery shopping due to his severe COPD and DOE.  Does not have extra money to pay for services like [employer] to help with food delivery.  RN clinical navigator also relayed this concern to SW.  Palliative will continue to monitor for symptom management and/or decline and will make recommendations as needed.  Have appointment in four weeks.  I spent 40 minutes providing this consultation,  including time with patient/family, chart review, provider coordination, and documentation. More than 50% of the time in this consultation was spent coordinating communication.   HISTORY OF PRESENT ILLNESS:  [name] is a 59 y.o. year old male with multiple medical problems including severeCOPD, GERD, nicotine dependence, depression, restless leg syndrome, persistent cough, shortness of breath. Palliative Care was asked to help address goals of care.   CODE STATUS: DNR  PPS: 60% HOSPICE ELIGIBILITY/DIAGNOSIS: TBD  PHYSICAL EXAM:   Deferred   PAST MEDICAL HISTORY:  Past Medical History:  Diagnosis Date  . Anxiety   . COPD (chronic obstructive pulmonary disease) (HCC)   . Depression   . Hydrocephalus (HCC)     SOCIAL HX:  Social History   Tobacco Use  . Smoking status: Current Every Day Smoker    Packs/day: 1.00    Years: 40.00    Pack years: 40.00  Types: Cigarettes  . Smokeless tobacco: Never Used  . Tobacco comment: Informed patient that we can give him materials when he is ready to quit.  Substance Use Topics  . Alcohol use: No    ALLERGIES:  Allergies  Allergen Reactions  . Acetaminophen Other (See Comments)    Syncope  . Percocet [Oxycodone-Acetaminophen]      PERTINENT MEDICATIONS:  Outpatient Encounter Medications as of 07/06/2019  Medication Sig  . albuterol (PROVENTIL) (2.5 MG/3ML) 0.083% nebulizer solution USE ONE VIAL IN NEBULIZER EVERY SIX HOURS AS NEEDED FOR WHEEZING OR  SHORTNESS OF BREATH  . albuterol (VENTOLIN HFA) 108 (90 Base) MCG/ACT inhaler INHALE 2 PUFFS INTO THE LUNGS EVERY 4 HOURS AS NEEDED FOR WHEEZING OR SHORTNESS OF BREATH  . busPIRone (BUSPAR) 10 MG tablet TAKE ONE TABLET TWICE DAILY  . Fluticasone-Salmeterol (ADVAIR DISKUS) 250-50 MCG/DOSE AEPB Inhale 1 puff into the lungs 2 (two) times daily.  [name] Kitchen HYDROcodone-homatropine (HYCODAN) 5-1.5 MG/5ML syrup Take 5 mLs by mouth every 8 (eight) hours as needed.  . montelukast (SINGULAIR) 10 MG tablet Take 1 tablet (10 mg total) by mouth at bedtime.  [name] Kitchen omeprazole (PRILOSEC) 20 MG capsule TAKE 1 CAPSULE BY MOUTH EVERY DAY  . predniSONE (DELTASONE) 10 MG tablet Take 10 mg by mouth daily with breakfast.  . rOPINIRole (REQUIP) 0.5 MG tablet TAKE ONE TABLET EVERY MORNING AND TAKE THREE TABLETS AT BEDTIME AS NEEDED FOR RESTLESS LEG SYNDROME  . sertraline (ZOLOFT) 100 MG tablet Take 1.5 tablets (150 mg total) by mouth daily.  [name] Kitchen SPIRIVA HANDIHALER 18 MCG inhalation capsule INHALE CONTENTS OF 1 CAPSULE AS DIRECTEDEVERY DAY WITH HANDIHALER  . [DISCONTINUED] albuterol (PROVENTIL HFA;VENTOLIN HFA) 108 (90 Base) MCG/ACT inhaler Inhale 2 puffs into the lungs every 4 (four) hours as needed for wheezing or shortness of breath.  . [DISCONTINUED] albuterol (VENTOLIN HFA) 108 (90 Base) MCG/ACT inhaler INHALE 2 PUFFS INTO THE LUNGS EVERY 4 HOURS AS NEEDED FOR WHEEZING OR SHORTNESS OF BREATH  . [DISCONTINUED] rOPINIRole (REQUIP) 0.5 MG tablet TAKE ONE TABLET BY MOUTH EVERY MORNING AND 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   No facility-administered encounter medications on file as of 07/06/2019.      Amy Jenetta Downer, NP

## 2019-07-06 NOTE — Patient Instructions (Signed)
Visit Information  Goals Addressed            This Visit's Progress   . RNCM: I now would like to get the rails for my bed       CARE PLAN ENTRY (see longtitudinal plan of care for additional care plan information)  Current Barriers:  [name] Kitchen Knowledge Deficits related to how to obtain bed rails to go on his hospital bed due to chronic disease processes  . Lacks caregiver support.  . Corporate treasurer.   Nurse Case Manager Clinical Goal(s):  [name] Kitchen Over the next 90 days, patient will verbalize understanding of plan for obtaining bed rails for his hospital bed her uses in home due to his chronic health  conditions . Over the next 90 days, patient will work with [facility] and CCM team to address needs related to DME needs  . Over the next 90 days, patient will demonstrate a decrease in COPD exacerbations as evidenced by taking medications as prescribed, following the poc, and having needed DME to remain safe in the home environment . Over the next 90 days, patient will work with CM clinical social worker to for depression and other low income help . Over the next 90 days, patient will work with care guides  (community agency) to food resources and help  Interventions:  . Evaluation of current treatment plan related to the need now for the patient to have rails on his hospital bed for safety and patient's adherence to plan as established by provider. . Provided education to patient re: discussed that the [facility] will call and they will have someone come out and place the rails on the bed for the patient . Collaborated with [employer] and the interdisciplinary team  regarding the patients request for rails to be placed on his hospital bed . Discussed plans with patient for ongoing care management follow up and provided patient with direct contact information for care management team . Care Guide referral for food resources in the community . Social Work referral for depression  and LIS information . RNCM contacted the [employer] company and spoke to a respresentative who states that there was no need for an additional order. They have the information they need and will send someone to the home to install the rails on the hospital bed and instruct the patient how to  use   Patient Self Care Activities:  . Patient verbalizes understanding of plan to have the [employer] [facility] [employer] company come in and install the bed rails on his hospital bed . Attends all scheduled provider appointments . Calls provider office for new concerns or questions . Unable to independently get rails for his hospital bed as evidence of not know what DME company supplied hospital bed   Initial goal documentation        Patient verbalizes understanding of instructions provided today.   The care management team will reach out to the patient again over the next 30 to 60 days.   Alto Denver RN, MSN, CCM Community Care Coordinator [place]  [facility] Mobile: [phone]

## 2019-07-12 ENCOUNTER — Encounter: Payer: Self-pay | Admitting: Nurse Practitioner

## 2019-07-12 ENCOUNTER — Ambulatory Visit: Payer: Self-pay | Admitting: General Practice

## 2019-07-12 ENCOUNTER — Telehealth: Payer: Self-pay | Admitting: Nurse Practitioner

## 2019-07-12 ENCOUNTER — Ambulatory Visit: Payer: Self-pay | Admitting: Licensed Clinical Social Worker

## 2019-07-12 ENCOUNTER — Ambulatory Visit: Payer: Medicare Other | Admitting: General Practice

## 2019-07-12 DIAGNOSIS — J449 Chronic obstructive pulmonary disease, unspecified: Secondary | ICD-10-CM

## 2019-07-12 DIAGNOSIS — F331 Major depressive disorder, recurrent, moderate: Secondary | ICD-10-CM | POA: Diagnosis not present

## 2019-07-12 DIAGNOSIS — F4321 Adjustment disorder with depressed mood: Secondary | ICD-10-CM | POA: Diagnosis not present

## 2019-07-12 DIAGNOSIS — K861 Other chronic pancreatitis: Secondary | ICD-10-CM

## 2019-07-12 DIAGNOSIS — F17219 Nicotine dependence, cigarettes, with unspecified nicotine-induced disorders: Secondary | ICD-10-CM

## 2019-07-12 NOTE — Telephone Encounter (Deleted)
@  LOGO@  07/12/2019  Name: Austin Blevins   MRN: [id number]   DOB: [birth year]   AGE: 59 y.o.   GENDER: male   PCP [name], NP.   Called pt regarding Community Resource Referral for [employer]  [employer] . Embedded Care Coordination [facility] Management [email or web address]  [phone]

## 2019-07-12 NOTE — Telephone Encounter (Signed)
Letter to pt   Austin Blevins [address] [place], [place]  [address] Phone:  [phone]   Fax:  [phone]   July 12, 2019   Austin Blevins [address] [place] [address]   Dear [name],   Thank you for taking the time to speak with me on the phone today regarding community resources for food.  Enclosed are some of these resources that you may find helpful, please call me at the number listed below if you have any follow-up questions.  Austin Blevins  Care Guide  Austin Blevins   Care Management ??[email or web address]  ??[phone]

## 2019-07-12 NOTE — Telephone Encounter (Signed)
@  LOGO@

## 2019-07-12 NOTE — Telephone Encounter (Addendum)
Email to [employer] From: Manuela Schwartz [facility])  Sent: Wednesday, July 12, 2019 2:34 PM To: '[email or web address]> Subject: Secure: Clarisa Fling Kitchen Referral - Daryel Gerald - [facility] - 07/12/19   Good Langston Reusing,  Mr. Pak is a patient at [facility] and is 59  y/o and lives with his mother Berton Mount is 54. He has COPD  I have sent a list of food pantries and the [employer].   Both he and his mother are in need of meals.  No Food Allergies Here is the contact information:  Juanita Devincent [address] [place], [place] [address]  2061/07/30 [phone] (H)  Thank you [name], and please let me know if you need anything further!   Manuela Schwartz  Care Guide . Embedded Care Coordination   Care Management ??[email or web address]  ??[phone]

## 2019-07-12 NOTE — Patient Instructions (Signed)
Visit Information  Goals Addressed            This Visit's Progress   . RN- COPD/Depression/Chronic Pancreatitis Management (pt-stated)       Current Barriers:  [name] Kitchen Knowledge deficits related to basic COPD self care/management . Knowledge deficit related to Depression and Chronic pancreatitis . Knowledge deficit related to importance of energy conservation  . Financial Barriers   Case Manager Clinical Goal(s):  Over the next 90 days patient will report using inhalers as prescribed including rinsing mouth after use  Over the next 90 days patient will report utilizing pursed lip breathing for shortness of breath  Over the next 90 days, patient will be able to verbalize understanding of COPD action plan and when to seek appropriate levels of medical care  Over the next 90 days, patient will engage in lite exercise as tolerated to build/regain stamina and strength and reduce shortness of breath through activity tolerance  Over the next 90 days, patient will verbalize basic understanding of COPD disease process and self care activities  Over the next 90 days the patient will work with pulmonary provider to get a new nebulizer machine as the one he is using is one he got at a yard sale  Over the next 90 days the patient with work with the oxygen company to get needed oxygen tanks. The patient now has an [employer] machine and was told that [employer], would not be supplier anymore. The patient is trying to confirm the information he needs so he will not be without his oxygen.  Over the next 90 days, the patient will work with the [facility] for effective management of depression and chronic pancreatitis   Interventions:   Provided patient with COPD action plan and reinforced importance of daily self assessment  Discussed Pulmonary Rehab and offered to assist with referral placement  Advised patient to engage in light exercise as tolerated 3-5 days a week- patient easily gets shob  when ambulating  Discussed with patient copd symptoms, visible shob on exertion and cough present on todays visit   Messaging sent to Dr. Raul Del asking for an order for a new nebulizer machine -Patient stated he bought his nebulizer at a yard sale and it is older. Made him aware we could work towards getting him a new nebulizer machine.   Patient reports his new medicare benefit has started, he is no longer solely Medicaid.   Supplied the patient with [employer] ensure and coupons at today's visit   Patient Self Care Activities:  Takes medications as prescribed including inhalers  Practices and uses pursed lip breathing for shortness of breath recovery and prevention  Self assesses COPD action plan zone and makes appointment with provider if in the yellow zone for 48 hours without improvement.  Engages in light exercise 3-5 days a week  Utilizes infection prevention strategies to reduce risk of respiratory infection   Please see past updates related to this goal by clicking on the "Past Updates" button in the selected goal      . RNCM: I now would like to get the rails for my bed       CARE PLAN ENTRY (see longtitudinal plan of care for additional care plan information)  Current Barriers:  [name] Kitchen Knowledge Deficits related to how to obtain bed rails to go on his hospital bed due to chronic disease processes  . Lacks caregiver support.  . Film/video editor.   Nurse Case Manager Clinical Goal(s):  [name] Kitchen Over the  next 90 days, patient will verbalize understanding of plan for obtaining bed rails for his hospital bed her uses in home due to his chronic health  conditions . Over the next 90 days, patient will work with [facility] and CCM team to address needs related to DME needs  . Over the next 90 days, patient will demonstrate a decrease in COPD exacerbations as evidenced by taking medications as prescribed, following the poc, and having needed DME to remain safe in the home environment . Over  the next 90 days, patient will work with CM clinical social worker to for depression and other low income help . Over the next 90 days, patient will work with care guides  (community agency) to food resources and help- [employer] referral done today- 07-12-2019  Interventions:  . Evaluation of current treatment plan related to the need now for the patient to have rails on his hospital bed for safety and patient's adherence to plan as established by provider. . Provided education to patient re: discussed that the [employer] [facility] [employer] will call and they will have someone come out and place the rails on the bed for the patient- per the patient they have not contacted him yet to come out and put the rails on the bed. A second call placed to [facility] supply today. They now say they need an order for the bed rails.  Message sent to pcp to get bed rail order for his hospital bed. Will let the patient know with a follow up call.  Steele Sizer with [facility] [employer] and the interdisciplinary team  regarding the patients request for rails to be placed on his hospital bed . Discussed plans with patient for ongoing care management follow up and provided patient with direct contact information for care management team . Care Guide referral for food resources in the community . Social Work referral for depression and LIS information . RNCM contacted the [employer] [facility] company and spoke to a respresentative who states that there was no need for an additional order. They have the information they need and will send someone to the home to install the rails on the hospital bed and instruct the patient how to  use   Patient Self Care Activities:  . Patient verbalizes understanding of plan to have the [facility] [employer] company come in and install the bed rails on his hospital bed . Attends all scheduled provider appointments . Calls provider office for new concerns or questions . Unable  to independently get rails for his hospital bed as evidence of not know what DME company supplied hospital bed   Please see past updates related to this goal by clicking on the "Past Updates" button in the selected goal         Patient verbalizes understanding of instructions provided today.   The care management team will reach out to the patient again over the next 60 days.  Alto Denver RN, MSN, CCM Community Care Coordinator [place]  [facility] Mobile: [phone]

## 2019-07-12 NOTE — Telephone Encounter (Signed)
   07/12/2019  Name: Austin Blevins   MRN: [id number]   DOB: 1960/07/11   AGE: 59 y.o.   GENDER: male   PCP [name], NP.   Called pt regarding [employer] Referral for food insecurities. LCSW informed me that pt is suffering from severe malnourishment and needs all the help he can get. CG to: -Referral for [employer] for pt and his mother -Arts development officer for [facility] -Referral to [employer] for additional samples.  Manuela Schwartz  Care Guide . Embedded Care Coordination [facility] Management [email or web address]  [phone]

## 2019-07-12 NOTE — Telephone Encounter (Signed)
Email to [employer] From: Manuela Schwartz [facility])  Sent: Wednesday, July 12, 2019 2:38 PM To: Trevor [email or web address]> Cc: Hill, [email or web address]>; Dickie La ([email or web address]) [email or web address]> Subject: SECURE: Referral for Ensure Samples - [facility] - Daryel Gerald  Good Afternoon Haylee,  Could you please ship a box of Ensure samples and coupons to [facility], a pt of ours patient name is struggling and could use some assistance.  [facility] c/o [place], LPN [address] [place], [place] [address] [phone]  MRN:  [id number]  Many thanks,   Manuela Schwartz  Care Guide  Embedded Care Coordination [facility]   Care Management ??[email or web address]   ??[phone]

## 2019-07-12 NOTE — Chronic Care Management (AMB) (Signed)
Chronic Care Management    Clinical Social Work Follow Up Note  07/12/2019 Name: Austin Blevins MRN: [id number] DOB: 11-Jan-1961  Austin Blevins is a 59 y.o. year old male who is a primary care patient of [name], [name], NP. The CCM team was consulted for assistance with Food Insecurity and [place] and Resources.   Review of patient status, including review of consultants reports, other relevant assessments, and collaboration with appropriate care team members and the patient's provider was performed as part of comprehensive patient evaluation and provision of chronic care management services.    SDOH (Social Determinants of Health) assessments performed: Yes    Advanced Directives Status: <no information> See Care Plan for related entries.   Outpatient Encounter Medications as of 07/12/2019  Medication Sig Note  . albuterol (PROVENTIL) (2.5 MG/3ML) 0.083% nebulizer solution USE ONE VIAL IN NEBULIZER EVERY SIX HOURS AS NEEDED FOR WHEEZING OR SHORTNESS OF BREATH   . albuterol (VENTOLIN HFA) 108 (90 Base) MCG/ACT inhaler INHALE 2 PUFFS INTO THE LUNGS EVERY 4 HOURS AS NEEDED FOR WHEEZING OR SHORTNESS OF BREATH 07/04/2019: Using at least QID  . busPIRone (BUSPAR) 10 MG tablet TAKE ONE TABLET TWICE DAILY   . Fluticasone-Salmeterol (ADVAIR DISKUS) 250-50 MCG/DOSE AEPB Inhale 1 puff into the lungs 2 (two) times daily.   [name] Kitchen HYDROcodone-homatropine (HYCODAN) 5-1.5 MG/5ML syrup Take 5 mLs by mouth every 8 (eight) hours as needed.   . montelukast (SINGULAIR) 10 MG tablet Take 1 tablet (10 mg total) by mouth at bedtime.   [name] Kitchen omeprazole (PRILOSEC) 20 MG capsule TAKE 1 CAPSULE BY MOUTH EVERY DAY   . predniSONE (DELTASONE) 10 MG tablet Take 10 mg by mouth daily with breakfast.   . rOPINIRole (REQUIP) 0.5 MG tablet TAKE ONE TABLET EVERY MORNING AND TAKE THREE TABLETS AT BEDTIME AS NEEDED FOR RESTLESS LEG SYNDROME   . sertraline (ZOLOFT) 100 MG tablet Take 1.5 tablets (150 mg total) by mouth  daily.   [name] Kitchen SPIRIVA HANDIHALER 18 MCG inhalation capsule INHALE CONTENTS OF 1 CAPSULE AS DIRECTEDEVERY DAY WITH HANDIHALER   . [DISCONTINUED] albuterol (PROVENTIL HFA;VENTOLIN HFA) 108 (90 Base) MCG/ACT inhaler Inhale 2 puffs into the lungs every 4 (four) hours as needed for wheezing or shortness of breath. 12/13/2018: Using 4x day  . [DISCONTINUED] albuterol (VENTOLIN HFA) 108 (90 Base) MCG/ACT inhaler INHALE 2 PUFFS INTO THE LUNGS EVERY 4 HOURS AS NEEDED FOR WHEEZING OR SHORTNESS OF BREATH   . [DISCONTINUED] rOPINIRole (REQUIP) 0.5 MG tablet TAKE ONE TABLET BY MOUTH EVERY MORNING AND 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    No facility-administered encounter medications on file as of 07/12/2019.     Goals Addressed    . "I need financial assistance" (pt-stated)       Current Barriers:  . Financial constraints . Limited social support . Limited access to food . Lacks knowledge of community resource: available financial and nutritional suport resources  - Ensure coupons provided to patient to day.   Clinical Social Work Clinical Goal(s):  [name] Kitchen Over the next 90 days, client will work with SW to address concerns related to lack of finances in order to afford nutritional supplies  . Over the next 90 days, patient will work with LCSW to address needs related to financial contstraints  . Over the next 90 days, patient will attend all scheduled medical appointments  Interventions: . Patient interviewed and appropriate assessments performed . LCSW and RNCM completed joint CCM office visit today on  07/12/19.  [name] Kitchen Discussed plans with patient for ongoing care management follow up and provided patient with direct contact information for care management team . Added patient to the [employer] Ensure program in the past for patient to gain coupons/assistance/information  . Ensure coupons provided during office visit today . LCSW provided education on food insecurity/financial assistance  resources within the area  . Patient reports difficulty with meal preparation and is interested in [employer]. LCSW completed referral today on 07/12/19 . Patient reports ongoing depression and was educated on appropriate self-care and anxiety management coping skills. . Positive reinforcement provided for recent self-care implementation . LCSW provided reflective listening throughout entire session   Patient Self Care Activities:  . Attends all scheduled provider appointments . Calls provider office for new concerns or questions  Please see past updates related to this goal by clicking on the "Past Updates" button in the selected goal      Follow Up Plan: SW will follow up with patient by phone over the next quarter  Dickie La, BSW, MSW, LCSW [facility] Care Management [place]  [facility] [email or web address] Phone: [phone]

## 2019-07-12 NOTE — Chronic Care Management (AMB) (Signed)
Chronic Care Management   Follow Up Note   07/12/2019 Name: Austin Blevins MRN: [id number] DOB: 1960-10-13  Referred by: [name], NP Reason for referral : Chronic Care Management (Follow up: COPD/Depression/Restless legs/Chronic Pancreatiitis)   Austin Blevins is a 59 y.o. year old male who is a primary care patient of [name], [name], NP. The CCM team was consulted for assistance with chronic disease management and care coordination needs.    Review of patient status, including review of consultants reports, relevant laboratory and other test results, and collaboration with appropriate care team members and the patient's provider was performed as part of comprehensive patient evaluation and provision of chronic care management services.    SDOH (Social Determinants of Health) assessments performed: Yes See Care Plan activities for detailed interventions related to [facility])     Outpatient Encounter Medications as of 07/12/2019  Medication Sig Note  . albuterol (PROVENTIL) (2.5 MG/3ML) 0.083% nebulizer solution USE ONE VIAL IN NEBULIZER EVERY SIX HOURS AS NEEDED FOR WHEEZING OR SHORTNESS OF BREATH   . albuterol (VENTOLIN HFA) 108 (90 Base) MCG/ACT inhaler INHALE 2 PUFFS INTO THE LUNGS EVERY 4 HOURS AS NEEDED FOR WHEEZING OR SHORTNESS OF BREATH 07/04/2019: Using at least QID  . busPIRone (BUSPAR) 10 MG tablet TAKE ONE TABLET TWICE DAILY   . Fluticasone-Salmeterol (ADVAIR DISKUS) 250-50 MCG/DOSE AEPB Inhale 1 puff into the lungs 2 (two) times daily.   [name] Kitchen HYDROcodone-homatropine (HYCODAN) 5-1.5 MG/5ML syrup Take 5 mLs by mouth every 8 (eight) hours as needed.   . montelukast (SINGULAIR) 10 MG tablet Take 1 tablet (10 mg total) by mouth at bedtime.   [name] Kitchen omeprazole (PRILOSEC) 20 MG capsule TAKE 1 CAPSULE BY MOUTH EVERY DAY   . predniSONE (DELTASONE) 10 MG tablet Take 10 mg by mouth daily with breakfast.   . rOPINIRole (REQUIP) 0.5 MG tablet TAKE ONE TABLET EVERY MORNING AND TAKE THREE  TABLETS AT BEDTIME AS NEEDED FOR RESTLESS LEG SYNDROME   . sertraline (ZOLOFT) 100 MG tablet Take 1.5 tablets (150 mg total) by mouth daily.   [name] Kitchen SPIRIVA HANDIHALER 18 MCG inhalation capsule INHALE CONTENTS OF 1 CAPSULE AS DIRECTEDEVERY DAY WITH HANDIHALER   . [DISCONTINUED] albuterol (PROVENTIL HFA;VENTOLIN HFA) 108 (90 Base) MCG/ACT inhaler Inhale 2 puffs into the lungs every 4 (four) hours as needed for wheezing or shortness of breath. 12/13/2018: Using 4x day  . [DISCONTINUED] albuterol (VENTOLIN HFA) 108 (90 Base) MCG/ACT inhaler INHALE 2 PUFFS INTO THE LUNGS EVERY 4 HOURS AS NEEDED FOR WHEEZING OR SHORTNESS OF BREATH   . [DISCONTINUED] rOPINIRole (REQUIP) 0.5 MG tablet TAKE ONE TABLET BY MOUTH EVERY MORNING AND 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    No facility-administered encounter medications on file as of 07/12/2019.     Objective:   Goals Addressed            This Visit's Progress   . RN- COPD/Depression/Chronic Pancreatitis Management (pt-stated)       Current Barriers:  [name] Kitchen Knowledge deficits related to basic COPD self care/management . Knowledge deficit related to Depression and Chronic pancreatitis . Knowledge deficit related to importance of energy conservation  . Financial Barriers   Case Manager Clinical Goal(s):  Over the next 90 days patient will report using inhalers as prescribed including rinsing mouth after use  Over the next 90 days patient will report utilizing pursed lip breathing for shortness of breath  Over the next 90 days, patient will be able to verbalize understanding  of COPD action plan and when to seek appropriate levels of medical care  Over the next 90 days, patient will engage in lite exercise as tolerated to build/regain stamina and strength and reduce shortness of breath through activity tolerance  Over the next 90 days, patient will verbalize basic understanding of COPD disease process and self care activities  Over the  next 90 days the patient will work with pulmonary provider to get a new nebulizer machine as the one he is using is one he got at a yard sale  Over the next 90 days the patient with work with the oxygen company to get needed oxygen tanks. The patient now has an [employer] machine and was told that [employer], would not be supplier anymore. The patient is trying to confirm the information he needs so he will not be without his oxygen.  Over the next 90 days, the patient will work with the [facility] for effective management of depression and chronic pancreatitis   Interventions:   Provided patient with COPD action plan and reinforced importance of daily self assessment  Discussed Pulmonary Rehab and offered to assist with referral placement  Advised patient to engage in light exercise as tolerated 3-5 days a week- patient easily gets shob when ambulating  Discussed with patient copd symptoms, visible shob on exertion and cough present on todays visit   Messaging sent to Dr. Raul Del asking for an order for a new nebulizer machine -Patient stated he bought his nebulizer at a yard sale and it is older. Made him aware we could work towards getting him a new nebulizer machine.   Patient reports his new medicare benefit has started, he is no longer solely Medicaid.   Supplied the patient with [employer] ensure and coupons at today's visit   Patient Self Care Activities:  Takes medications as prescribed including inhalers  Practices and uses pursed lip breathing for shortness of breath recovery and prevention  Self assesses COPD action plan zone and makes appointment with provider if in the yellow zone for 48 hours without improvement.  Engages in light exercise 3-5 days a week  Utilizes infection prevention strategies to reduce risk of respiratory infection   Please see past updates related to this goal by clicking on the "Past Updates" button in the selected goal      . RNCM: I now  would like to get the rails for my bed       CARE PLAN ENTRY (see longtitudinal plan of care for additional care plan information)  Current Barriers:  [name] Kitchen Knowledge Deficits related to how to obtain bed rails to go on his hospital bed due to chronic disease processes  . Lacks caregiver support.  . Film/video editor.   Nurse Case Manager Clinical Goal(s):  [name] Kitchen Over the next 90 days, patient will verbalize understanding of plan for obtaining bed rails for his hospital bed her uses in home due to his chronic health  conditions . Over the next 90 days, patient will work with [facility] and CCM team to address needs related to DME needs  . Over the next 90 days, patient will demonstrate a decrease in COPD exacerbations as evidenced by taking medications as prescribed, following the poc, and having needed DME to remain safe in the home environment . Over the next 90 days, patient will work with CM clinical social worker to for depression and other low income help . Over the next 90 days, patient will work with care guides  (  community agency) to food resources and help- [employer] referral done today- 07-12-2019  Interventions:  . Evaluation of current treatment plan related to the need now for the patient to have rails on his hospital bed for safety and patient's adherence to plan as established by provider. . Provided education to patient re: discussed that the [employer] will call and they will have someone come out and place the rails on the bed for the patient- per the patient they have not contacted him yet to come out and put the rails on the bed. A second call placed to [facility] supply today. They now say they need an order for the bed rails.  Message sent to pcp to get bed rail order for his hospital bed. Will let the patient know with a follow up call.  Steele Sizer with [employer] and the interdisciplinary team  regarding the patients request for rails to be  placed on his hospital bed . Discussed plans with patient for ongoing care management follow up and provided patient with direct contact information for care management team . Care Guide referral for food resources in the community . Social Work referral for depression and LIS information . RNCM contacted the [employer] company and spoke to a respresentative who states that there was no need for an additional order. They have the information they need and will send someone to the home to install the rails on the hospital bed and instruct the patient how to  use   Patient Self Care Activities:  . Patient verbalizes understanding of plan to have the [employer] company come in and install the bed rails on his hospital bed . Attends all scheduled provider appointments . Calls provider office for new concerns or questions . Unable to independently get rails for his hospital bed as evidence of not know what DME company supplied hospital bed   Please see past updates related to this goal by clicking on the "Past Updates" button in the selected goal          Plan:   The care management team will reach out to the patient again over the next 60 days.    Alto Denver RN, MSN, CCM Community Care Coordinator [place]  [facility] Mobile: [phone]

## 2019-07-13 ENCOUNTER — Telehealth: Payer: Self-pay

## 2019-07-13 NOTE — Telephone Encounter (Signed)
Telephone call to patient to schedule palliative care visit with patient. Patient/family in agreement with home visit on 07-17-19 at 2:00PM.

## 2019-07-17 ENCOUNTER — Telehealth: Payer: Self-pay

## 2019-07-17 ENCOUNTER — Other Ambulatory Visit: Payer: Self-pay

## 2019-07-17 ENCOUNTER — Other Ambulatory Visit: Payer: Medicare Other

## 2019-07-17 DIAGNOSIS — Z515 Encounter for palliative care: Secondary | ICD-10-CM

## 2019-07-17 NOTE — Progress Notes (Signed)
COMMUNITY PALLIATIVE CARE SW NOTE  PATIENT NAME: Austin Blevins DOB: 05-02-1961 MRN: [id number]  PRIMARY CARE PROVIDER: [name], NP  RESPONSIBLE PARTY:  Acct ID - Guarantor Home Phone Work Phone Relationship Acct Type  [id number] [name]  Self P/F     [address], [place], [place] [address]     PLAN OF CARE and INTERVENTIONS:             1. GOALS OF CARE/ ADVANCE CARE PLANNING:  Patient is a FULL CODE. Goal is for his care to be managed and for him to start feeling better. Patient is allowing more assistance from his sister to support with his care coordination. 2. SOCIAL/EMOTIONAL/SPIRITUAL ASSESSMENT/ INTERVENTIONS:  SW met with patient and Austin Blevins (patient's sister) in the home. Patient reports pain in his lower rib cage. Patient is more SOB today and coughing more. Patient has called pulmonologist and is scheduled to see them this afternoon. Patient said that he has fallen out of his bed five times in the past few weeks, with his last fall being on Friday. Patient said he continues having vivid dreams and this causes him to move in his sleep and fall. Patient noted fear of going to sleep due to this. Patient said he tried Melatonin but did not notice a difference. SW notified NP. Patient said his appetite is about the same, but SW noted PCP office is encouraging Ensures and increased intake. Patient and SW contacted [employer] and [employer] to sort out a DME conflict. SW also contacted [employer] to confirm that patient's bed rail and shower chair were ready for pick-up. Patient was appreciative. SW validated feelings, discussed goals, and used active and reflective listening. 3. PATIENT/CAREGIVER EDUCATION/ COPING:  Patient is alert, engaged. Patient openly shared feelings with SW. Patient is calm, cooperative. Denies concerns other than his increasing SOB and discomfort in and around his rib cage. Family is supportive.  4. PERSONAL EMERGENCY PLAN:  Family will  call 9-1-1 for emergencies. 5. COMMUNITY RESOURCES COORDINATION/ HEALTH CARE NAVIGATION:  Patient is scheduled to see Dr. Raul Del today, concerns for pneumonia per Austin Blevins. Patient is scheduled to see PCP on 3/19. 6. FINANCIAL/LEGAL CONCERNS/INTERVENTIONS:  Patient receives SSDI, has Medicaid coverage. [facility] team with PCP office has helped with resource referrals. Patient is now receiving meals from [employer].     SOCIAL HX:  Social History   Tobacco Use  . Smoking status: Current Every Day Smoker    Packs/day: 1.00    Years: 40.00    Pack years: 40.00    Types: Cigarettes  . Smokeless tobacco: Never Used  . Tobacco comment: Informed patient that we can give him materials when he is ready to quit.  Substance Use Topics  . Alcohol use: No    CODE STATUS:   Code Status: Prior (FULL) ADVANCED DIRECTIVES: N MOST FORM COMPLETE:  No. HOSPICE EDUCATION PROVIDED: None.  PPS: Patient is independent of ADLs. Limited due to SOB and weakness.  I spent46mnutes with patient/family, from2:00-3:00pproviding education, support and consultation.  WMargaretmary Lombard LCSW

## 2019-07-18 ENCOUNTER — Other Ambulatory Visit: Payer: Self-pay

## 2019-07-18 ENCOUNTER — Other Ambulatory Visit: Payer: Medicare Other | Admitting: Adult Health Nurse Practitioner

## 2019-07-18 DIAGNOSIS — Z515 Encounter for palliative care: Secondary | ICD-10-CM

## 2019-07-18 DIAGNOSIS — J449 Chronic obstructive pulmonary disease, unspecified: Secondary | ICD-10-CM

## 2019-07-18 NOTE — Progress Notes (Signed)
Therapist, nutritional Palliative Care Consult Note Telephone: [phone]  Fax: [phone]  PATIENT NAME: Austin Blevins DOB: [birth year] MRN: [id number]  PRIMARY CARE PROVIDER:   [name], NP  REFERRING PROVIDER:  [name], NP [address] [place],  [place] [address]  RESPONSIBLE PARTY:   Self [phone]  Due to the COVID-19 crisis, this visit was done via telemedicine and it was initiated and consent by this patient and or family. Video-audio (telehealth) contact was unable to be done due to technical barriers from the patient's side.    RECOMMENDATIONS and PLAN:  1.  Advanced care planning.  Patient is a DNR  2.  Vivid dreams.  Patient has been having vivid dreams in which they have been so real he has fallen out of bed.  States that one of them he woke up and had one had on his nightstand and the other holding onto the bed and it seemed like he was stretched over an elevator shaft. States that he has been more forgetful with the poor sleep. He had an appointment with pulmonology yesterday due to pain in rib cage that goes all the way around.  He is awaiting results of xray and blood work.  He thought maybe it was related to pneumonia and was started on prednisone and levaquin.  He was also started on tramadol 50 mg Q 8 hours PRN for the pain.  States that pain is better today.  Believe this could also be an injury related to the falls.  Have reached out to [facility] about the dreams and falls and updated her on visit to pulmonology. Patient was started a few weeks ago on cymbalta for chronic pain.  This was stopped due to vivid dreams. The dreams seemed to go away and have come back.  Recommend psych referral to help with source and treatment of the dreams.   Patient also trying to get bed rails for his hospital bed and cannot remember the supply company from where he received the bed.  Have contacted RN clinical navigator to help locate the supply  company.  Palliative will continue to monitor for symptom management and/or decline and will make recommendations as needed. Have appointment in two weeks.  I spent providing this consultation,  from 10:00 to 10:30. More than 50% of the time in this consultation was spent coordinating communication.   HISTORY OF PRESENT ILLNESS:  Austin Blevins is a 59 y.o. year old male with multiple medical problems including severeCOPD, GERD, nicotine dependence, depression, restless leg syndrome, persistent cough, shortness of breath. Palliative Care was asked to help address goals of care.   CODE STATUS: DNR  PPS: 60% HOSPICE ELIGIBILITY/DIAGNOSIS: TBD  PHYSICAL EXAM:   Deferred   PAST MEDICAL HISTORY:  Past Medical History:  Diagnosis Date  . Anxiety   . COPD (chronic obstructive pulmonary disease) (HCC)   . Depression   . Hydrocephalus (HCC)     SOCIAL HX:  Social History   Tobacco Use  . Smoking status: Current Every Day Smoker    Packs/day: 1.00    Years: 40.00    Pack years: 40.00    Types: Cigarettes  . Smokeless tobacco: Never Used  . Tobacco comment: Informed patient that we can give him materials when he is ready to quit.  Substance Use Topics  . Alcohol use: No    ALLERGIES:  Allergies  Allergen Reactions  . Acetaminophen Other (See Comments)  Syncope  . Percocet [Oxycodone-Acetaminophen]      PERTINENT MEDICATIONS:  Outpatient Encounter Medications as of 07/18/2019  Medication Sig  . albuterol (PROVENTIL) (2.5 MG/3ML) 0.083% nebulizer solution USE ONE VIAL IN NEBULIZER EVERY SIX HOURS AS NEEDED FOR WHEEZING OR SHORTNESS OF BREATH  . albuterol (VENTOLIN HFA) 108 (90 Base) MCG/ACT inhaler INHALE 2 PUFFS INTO THE LUNGS EVERY 4 HOURS AS NEEDED FOR WHEEZING OR SHORTNESS OF BREATH  . busPIRone (BUSPAR) 10 MG tablet TAKE ONE TABLET TWICE DAILY  . Fluticasone-Salmeterol (ADVAIR DISKUS) 250-50 MCG/DOSE AEPB Inhale 1 puff into the lungs 2 (two) times daily.  [name] Kitchen  HYDROcodone-homatropine (HYCODAN) 5-1.5 MG/5ML syrup Take 5 mLs by mouth every 8 (eight) hours as needed.  . montelukast (SINGULAIR) 10 MG tablet Take 1 tablet (10 mg total) by mouth at bedtime.  [name] Kitchen omeprazole (PRILOSEC) 20 MG capsule TAKE 1 CAPSULE BY MOUTH EVERY DAY  . predniSONE (DELTASONE) 10 MG tablet Take 10 mg by mouth daily with breakfast.  . rOPINIRole (REQUIP) 0.5 MG tablet TAKE ONE TABLET EVERY MORNING AND TAKE THREE TABLETS AT BEDTIME AS NEEDED FOR RESTLESS LEG SYNDROME  . sertraline (ZOLOFT) 100 MG tablet Take 1.5 tablets (150 mg total) by mouth daily.  [name] Kitchen SPIRIVA HANDIHALER 18 MCG inhalation capsule INHALE CONTENTS OF 1 CAPSULE AS DIRECTEDEVERY DAY WITH HANDIHALER  . [DISCONTINUED] albuterol (PROVENTIL HFA;VENTOLIN HFA) 108 (90 Base) MCG/ACT inhaler Inhale 2 puffs into the lungs every 4 (four) hours as needed for wheezing or shortness of breath.  . [DISCONTINUED] albuterol (VENTOLIN HFA) 108 (90 Base) MCG/ACT inhaler INHALE 2 PUFFS INTO THE LUNGS EVERY 4 HOURS AS NEEDED FOR WHEEZING OR SHORTNESS OF BREATH  . [DISCONTINUED] rOPINIRole (REQUIP) 0.5 MG tablet TAKE ONE TABLET BY MOUTH EVERY MORNING AND 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   No facility-administered encounter medications on file as of 07/18/2019.      Austin Blevins Austin Downer, NP

## 2019-07-19 ENCOUNTER — Ambulatory Visit (INDEPENDENT_AMBULATORY_CARE_PROVIDER_SITE_OTHER): Payer: Medicare Other | Admitting: General Practice

## 2019-07-19 ENCOUNTER — Ambulatory Visit: Payer: Self-pay | Admitting: Pharmacist

## 2019-07-19 DIAGNOSIS — F419 Anxiety disorder, unspecified: Secondary | ICD-10-CM

## 2019-07-19 DIAGNOSIS — F331 Major depressive disorder, recurrent, moderate: Secondary | ICD-10-CM

## 2019-07-19 DIAGNOSIS — M545 Low back pain, unspecified: Secondary | ICD-10-CM

## 2019-07-19 DIAGNOSIS — J449 Chronic obstructive pulmonary disease, unspecified: Secondary | ICD-10-CM

## 2019-07-19 NOTE — Patient Instructions (Signed)
Visit Information  Goals Addressed            This Visit's Progress   . RNCM: I now would like to get the rails for my bed       CARE PLAN ENTRY (see longtitudinal plan of care for additional care plan information)  Current Barriers:  [name] Kitchen Knowledge Deficits related to how to obtain bed rails to go on his hospital bed due to chronic disease processes  . Lacks caregiver support.  . Film/video editor.   Nurse Case Manager Clinical Goal(s):  [name] Kitchen Over the next 90 days, patient will verbalize understanding of plan for obtaining bed rails for his hospital bed her uses in home due to his chronic health  conditions . Over the next 90 days, patient will work with [facility] and CCM team to address needs related to DME needs  . Over the next 90 days, patient will demonstrate a decrease in COPD exacerbations as evidenced by taking medications as prescribed, following the poc, and having needed DME to remain safe in the home environment . Over the next 90 days, patient will work with CM clinical social worker to for depression and other low income help . Over the next 90 days, patient will work with care guides  (community agency) to food resources and help- [employer] referral done today- 07-12-2019  Interventions:  . Evaluation of current treatment plan related to the need now for the patient to have rails on his hospital bed for safety and patient's adherence to plan as established by provider. . Provided education to patient re: discussed that the [employer] will call and they will have someone come out and place the rails on the bed for the patient- per the patient they have not contacted him yet to come out and put the rails on the bed. A second call placed to [facility] supply today. They now say they need an order for the bed rails.  Message sent to pcp to get bed rail order for his hospital bed. Will let the patient know with a follow up call. Per the patient on today's call  (07/19/2019) the company is coming out tomorrow to place the rails on the patient's bed. Nash Dimmer with [employer] and the interdisciplinary team  regarding the patients request for rails to be placed on his hospital bed- done . Discussed plans with patient for ongoing care management follow up and provided patient with direct contact information for care management team . Care Guide referral for food resources in the community- done . Social Work referral for depression and LIS information- in progress   Patient Self Care Activities:  . Patient verbalizes understanding of plan to have the [place] come in and install the bed rails on his hospital bed . Attends all scheduled provider appointments . Calls provider office for new concerns or questions . Unable to independently get rails for his hospital bed as evidence of not know what DME company supplied hospital bed   Please see past updates related to this goal by clicking on the "Past Updates" button in the selected goal      . RNCM: Pt-"I need help with controlling this pain" (pt-stated)       [place] (see longtitudinal plan of care for additional care plan information)  Current Barriers:  [name] Kitchen Knowledge Deficits related to pain control in a patient with complex medical conditions with Chronic pain that is getting worse per  the patient  . Lacks caregiver support.  . Corporate treasurer.   Nurse Case Manager Clinical Goal(s):  [name] Kitchen Over the next 90 days, patient will verbalize understanding of plan for managing chronic pain and discomfort  . Over the next 90 days, patient will work with [facility], CCM team, and pcp  to address needs related to worsening Chronic pain and discomfort and the patient verbalizing he needs assistance with a pain specialist/clinic referral or other resources to help in managing his pain and discomfort . Over the next 90 days, patient will demonstrate a decrease in pain   exacerbations as evidenced by stated decrease in pain level and discomfort   Interventions:  . Evaluation of current treatment plan related to pain management  and patient's adherence to plan as established by provider. . Provided education to patient re: discussing pain concerns with pcp . Reviewed medications with patient and discussed compliance  . Collaborated with pcp and  CCM pharmacist regarding uncontrolled chronic pain and the patients request for a referral to a pain specialist/clinic or other recommendations for helping with pain control   Patient Self Care Activities:  . Patient verbalizes understanding of plan to discuss pain control options with the interdisciplinary team for recommendations and resources  . Self administers medications as prescribed . Attends all scheduled provider appointments . Performs IADL's independently . Calls provider office for new concerns or questions . Unable to independently control chronic pain and discomfort  . Lacks social connections . Unable to perform IADLs independently  Initial goal documentation        Patient verbalizes understanding of instructions provided today.   The care management team will reach out to the patient again over the next 60 days.   Alto Denver RN, MSN, CCM Community Care Coordinator Agency  [facility] Mobile: [phone]

## 2019-07-19 NOTE — Patient Instructions (Signed)
Visit Information  Goals Addressed            This Visit's Progress     Patient Stated   . PharmD "I have a lot of medications" (pt-stated)       CARE PLAN ENTRY (see longtitudinal plan of care for additional care plan information)  Current Barriers:  . Polypharmacy - complex patient with multiple chronic conditions including COPD, tobacco abuse, depression, RLS . Receives medications in pill packages from [facility]. Of note, he gets his month supply of Pill Packs around the 4th of each month o COPD: follows w/ palliative care, pulmonology at [facility]. [name] Kitchen Continues Advair 250/50 mcg BID + Spiriva 18 mcg daily, albuterol HFA or neb PRN; prednisone 10 mg daily. Notes that he needs a new nebulizer machine; the one he has was bought at a yardsale. Collaborating w/ RN CM about that. Recently saw Dr. Meredeth Ide for pain, possibly related to falling out of bed or infectious process. Xray pending o Depression/chronic pain: trialed duloxetine, but developed vivid dreams. Duloxetine order was d/c around 07/05/19 and sertraline increased. Continues buspirone 10 mg BID o RLS: ropinirole 0.5 mg QAM, 1.5 mg QPM o GERD: omeprazole 20 mg QAM o Insomnia: patient notes this continues to be a problem. Palliative Care recommended he try melatonin; he notes he has not purchased this yet o Tobacco abuse; notes that he is not interested in smoking cessation at this point.  Pharmacist Clinical Goal(s):  [name] Kitchen Over the next 90 days, patient will work with PharmD and PCP to address needs related to optmized medication management  Interventions: . Contacted [facility]. Patient did not bring his packs to the pharmacy to have duloxetine removed/sertraline dose adjusted so he has still been taking duloxetine. They are filling his March pack now, so the d/c of duloxetine and increase in sertraline change will happen starting tomorrow. Recommend monitoring for a few weeks on this change prior to referral to psych.   . Seemed to be some confusion about where bed rails were coming from in patient's last visit w/ Palliative Care. Will collaborate w/ RN CM for support with that, to prevent patient from falling out of bed  Patient Self Care Activities:  . Self administers medications as prescribed  Please see past updates related to this goal by clicking on the "Past Updates" button in the selected goal         Patient verbalizes understanding of instructions provided today.   Plan:  - Will collaborate w/ Care Team as above.  - Will outreach as previously scheduled  Catie Feliz Beam, PharmD, [facility] Clinical Pharmacist [facility] [phone]

## 2019-07-19 NOTE — Chronic Care Management (AMB) (Signed)
Chronic Care Management   Follow Up Note   07/19/2019 Name: Austin Blevins MRN: [id number] DOB: 1960-06-19  Referred by: [name], NP Reason for referral : Chronic Care Management (Medication Management)   Austin Blevins is a 59 y.o. year old male who is a primary care patient of [name], [name], NP. The CCM team was consulted for assistance with chronic disease management and care coordination needs.    Care coordination completed today.   Review of patient status, including review of consultants reports, relevant laboratory and other test results, and collaboration with appropriate care team members and the patient's provider was performed as part of comprehensive patient evaluation and provision of chronic care management services.    SDOH (Social Determinants of Health) assessments performed: No See Care Plan activities for detailed interventions related to [facility])     Outpatient Encounter Medications as of 07/19/2019  Medication Sig Note  . albuterol (PROVENTIL) (2.5 MG/3ML) 0.083% nebulizer solution USE ONE VIAL IN NEBULIZER EVERY SIX HOURS AS NEEDED FOR WHEEZING OR SHORTNESS OF BREATH   . albuterol (VENTOLIN HFA) 108 (90 Base) MCG/ACT inhaler INHALE 2 PUFFS INTO THE LUNGS EVERY 4 HOURS AS NEEDED FOR WHEEZING OR SHORTNESS OF BREATH 07/04/2019: Using at least QID  . busPIRone (BUSPAR) 10 MG tablet TAKE ONE TABLET TWICE DAILY   . Fluticasone-Salmeterol (ADVAIR DISKUS) 250-50 MCG/DOSE AEPB Inhale 1 puff into the lungs 2 (two) times daily.   [name] Kitchen HYDROcodone-homatropine (HYCODAN) 5-1.5 MG/5ML syrup Take 5 mLs by mouth every 8 (eight) hours as needed.   . montelukast (SINGULAIR) 10 MG tablet Take 1 tablet (10 mg total) by mouth at bedtime.   [name] Kitchen omeprazole (PRILOSEC) 20 MG capsule TAKE 1 CAPSULE BY MOUTH EVERY DAY   . predniSONE (DELTASONE) 10 MG tablet Take 10 mg by mouth daily with breakfast.   . rOPINIRole (REQUIP) 0.5 MG tablet TAKE ONE TABLET EVERY MORNING AND TAKE THREE TABLETS AT  BEDTIME AS NEEDED FOR RESTLESS LEG SYNDROME   . sertraline (ZOLOFT) 100 MG tablet Take 1.5 tablets (150 mg total) by mouth daily.   [name] Kitchen SPIRIVA HANDIHALER 18 MCG inhalation capsule INHALE CONTENTS OF 1 CAPSULE AS DIRECTEDEVERY DAY WITH HANDIHALER   . [DISCONTINUED] albuterol (PROVENTIL HFA;VENTOLIN HFA) 108 (90 Base) MCG/ACT inhaler Inhale 2 puffs into the lungs every 4 (four) hours as needed for wheezing or shortness of breath. 12/13/2018: Using 4x day  . [DISCONTINUED] albuterol (VENTOLIN HFA) 108 (90 Base) MCG/ACT inhaler INHALE 2 PUFFS INTO THE LUNGS EVERY 4 HOURS AS NEEDED FOR WHEEZING OR SHORTNESS OF BREATH   . [DISCONTINUED] rOPINIRole (REQUIP) 0.5 MG tablet TAKE ONE TABLET BY MOUTH EVERY MORNING AND 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    No facility-administered encounter medications on file as of 07/19/2019.     Objective:   Goals Addressed            This Visit's Progress     Patient Stated   . PharmD "I have a lot of medications" (pt-stated)       CARE PLAN ENTRY (see longtitudinal plan of care for additional care plan information)  Current Barriers:  . Polypharmacy - complex patient with multiple chronic conditions including COPD, tobacco abuse, depression, RLS . Receives medications in pill packages from [facility]. Of note, he gets his month supply of Pill Packs around the 4th of each month o COPD: follows w/ palliative care, pulmonology at [facility]. [name] Kitchen Continues Advair 250/50 mcg BID + Spiriva 18  mcg daily, albuterol HFA or neb PRN; prednisone 10 mg daily. Notes that he needs a new nebulizer machine; the one he has was bought at a yardsale. Collaborating w/ RN CM about that. Recently saw Dr. Meredeth Ide for pain, possibly related to falling out of bed or infectious process. Xray pending o Depression/chronic pain: trialed duloxetine, but developed vivid dreams. Duloxetine order was d/c around 07/05/19 and sertraline increased. Continues buspirone 10 mg  BID o RLS: ropinirole 0.5 mg QAM, 1.5 mg QPM o GERD: omeprazole 20 mg QAM o Insomnia: patient notes this continues to be a problem. Palliative Care recommended he try melatonin; he notes he has not purchased this yet o Tobacco abuse; notes that he is not interested in smoking cessation at this point.  Pharmacist Clinical Goal(s):  [name] Kitchen Over the next 90 days, patient will work with PharmD and PCP to address needs related to optmized medication management  Interventions: . Contacted [facility]. Patient did not bring his packs to the pharmacy to have duloxetine removed/sertraline dose adjusted so he has still been taking duloxetine. They are filling his March pack now, so the d/c of duloxetine and increase in sertraline change will happen starting tomorrow. Recommend monitoring for a few weeks on this change prior to referral to psych.  . Seemed to be some confusion about where bed rails were coming from in patient's last visit w/ Palliative Care. Will collaborate w/ RN CM for support with that, to prevent patient from falling out of bed  Patient Self Care Activities:  . Self administers medications as prescribed  Please see past updates related to this goal by clicking on the "Past Updates" button in the selected goal          Plan:  - Will collaborate w/ Care Team as above.  - Will outreach as previously scheduled  Catie Feliz Beam, PharmD, [facility] Clinical Pharmacist [facility] [phone]

## 2019-07-19 NOTE — Chronic Care Management (AMB) (Signed)
Chronic Care Management   Follow Up Note   07/19/2019 Name: Austin Blevins MRN: [id number] DOB: 09-Mar-1961  Referred by: [name], NP Reason for referral : Chronic Care Management (Incoming call from the patient asking for help with chronic pain/pain clinic/ follow up on bed rails)   Austin Blevins is a 59 y.o. year old male who is a primary care patient of Austin Blevins, [name], NP. The CCM team was consulted for assistance with chronic disease management and care coordination needs.    Review of patient status, including review of consultants reports, relevant laboratory and other test results, and collaboration with appropriate care team members and the patient's provider was performed as part of comprehensive patient evaluation and provision of chronic care management services.    SDOH (Social Determinants of Health) assessments performed: No See Care Plan activities for detailed interventions related to [facility])     Outpatient Encounter Medications as of 07/19/2019  Medication Sig Note   albuterol (PROVENTIL) (2.5 MG/3ML) 0.083% nebulizer solution USE ONE VIAL IN NEBULIZER EVERY SIX HOURS AS NEEDED FOR WHEEZING OR SHORTNESS OF BREATH    albuterol (VENTOLIN HFA) 108 (90 Base) MCG/ACT inhaler INHALE 2 PUFFS INTO THE LUNGS EVERY 4 HOURS AS NEEDED FOR WHEEZING OR SHORTNESS OF BREATH 07/04/2019: Using at least QID   busPIRone (BUSPAR) 10 MG tablet TAKE ONE TABLET TWICE DAILY    Fluticasone-Salmeterol (ADVAIR DISKUS) 250-50 MCG/DOSE AEPB Inhale 1 puff into the lungs 2 (two) times daily.    HYDROcodone-homatropine (HYCODAN) 5-1.5 MG/5ML syrup Take 5 mLs by mouth every 8 (eight) hours as needed.    montelukast (SINGULAIR) 10 MG tablet Take 1 tablet (10 mg total) by mouth at bedtime.    omeprazole (PRILOSEC) 20 MG capsule TAKE 1 CAPSULE BY MOUTH EVERY DAY    predniSONE (DELTASONE) 10 MG tablet Take 10 mg by mouth daily with breakfast.    rOPINIRole (REQUIP) 0.5 MG tablet TAKE ONE TABLET  EVERY MORNING AND TAKE THREE TABLETS AT BEDTIME AS NEEDED FOR RESTLESS LEG SYNDROME    sertraline (ZOLOFT) 100 MG tablet Take 1.5 tablets (150 mg total) by mouth daily.    SPIRIVA HANDIHALER 18 MCG inhalation capsule INHALE CONTENTS OF 1 CAPSULE AS DIRECTEDEVERY DAY WITH HANDIHALER    [DISCONTINUED] albuterol (PROVENTIL HFA;VENTOLIN HFA) 108 (90 Base) MCG/ACT inhaler Inhale 2 puffs into the lungs every 4 (four) hours as needed for wheezing or shortness of breath. 12/13/2018: Using 4x day   [DISCONTINUED] albuterol (VENTOLIN HFA) 108 (90 Base) MCG/ACT inhaler INHALE 2 PUFFS INTO THE LUNGS EVERY 4 HOURS AS NEEDED FOR WHEEZING OR SHORTNESS OF BREATH    [DISCONTINUED] rOPINIRole (REQUIP) 0.5 MG tablet TAKE ONE TABLET BY MOUTH EVERY MORNING AND 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    No facility-administered encounter medications on file as of 07/19/2019.     Objective:   Goals Addressed            This Visit's Progress    RNCM: I now would like to get the rails for my bed       CARE PLAN ENTRY (see longtitudinal plan of care for additional care plan information)  Current Barriers:   Knowledge Deficits related to how to obtain bed rails to go on his hospital bed due to chronic disease processes   Lacks caregiver support.   Corporate treasurer.   Nurse Case Manager Clinical Goal(s):   Over the next 90 days, patient will verbalize understanding of plan for obtaining  bed rails for his hospital bed her uses in home due to his chronic health  conditions  Over the next 90 days, patient will work with [facility] and CCM team to address needs related to DME needs   Over the next 90 days, patient will demonstrate a decrease in COPD exacerbations as evidenced by taking medications as prescribed, following the poc, and having needed DME to remain safe in the home environment  Over the next 90 days, patient will work with CM clinical social worker to for depression and other  low income help  Over the next 90 days, patient will work with care guides  (community agency) to food resources and help- [employer] referral done today- 07-12-2019  Interventions:   Evaluation of current treatment plan related to the need now for the patient to have rails on his hospital bed for safety and patient's adherence to plan as established by provider.  Provided education to patient re: discussed that the [facility] will call and they will have someone come out and place the rails on the bed for the patient- per the patient they have not contacted him yet to come out and put the rails on the bed. A second call placed to [facility] supply today. They now say they need an order for the bed rails.  Message sent to pcp to get bed rail order for his hospital bed. Will let the patient know with a follow up call. Per the patient on today's call (07/19/2019) the company is coming out tomorrow to place the rails on the patient's bed.  Collaborated with [facility] [employer] and the interdisciplinary team  regarding the patients request for rails to be placed on his hospital bed- done  Discussed plans with patient for ongoing care management follow up and provided patient with direct contact information for care management team  Care Guide referral for food resources in the community- done  Social Work referral for depression and LIS information- in progress   Patient Self Care Activities:   Patient verbalizes understanding of plan to have the [employer] come in and install the bed rails on his hospital bed  Attends all scheduled provider appointments  Calls provider office for new concerns or questions  Unable to independently get rails for his hospital bed as evidence of not know what DME company supplied hospital bed   Please see past updates related to this goal by clicking on the "Past Updates" button in the selected goal       RNCM:  Pt-"I need help with controlling this pain" (pt-stated)       CARE PLAN ENTRY (see longtitudinal plan of care for additional care plan information)  Current Barriers:   Knowledge Deficits related to pain control in a patient with complex medical conditions with Chronic pain that is getting worse per the patient   Lacks caregiver support.   Film/video editor.   Nurse Case Manager Clinical Goal(s):   Over the next 90 days, patient will verbalize understanding of plan for managing chronic pain and discomfort   Over the next 90 days, patient will work with [facility] team, and pcp  to address needs related to worsening Chronic pain and discomfort and the patient verbalizing he needs assistance with a pain specialist/clinic referral or other resources to help in managing his pain and discomfort  Over the next 90 days, patient will demonstrate a decrease in pain  exacerbations as evidenced by stated decrease in  pain level and discomfort   Interventions:   Evaluation of current treatment plan related to pain management  and patient's adherence to plan as established by provider.  Provided education to patient re: discussing pain concerns with pcp  Reviewed medications with patient and discussed compliance   Collaborated with pcp and  CCM pharmacist regarding uncontrolled chronic pain and the patients request for a referral to a pain specialist/clinic or other recommendations for helping with pain control   Patient Self Care Activities:   Patient verbalizes understanding of plan to discuss pain control options with the interdisciplinary team for recommendations and resources   Self administers medications as prescribed  Attends all scheduled provider appointments  Performs IADL's independently  Calls provider office for new concerns or questions  Unable to independently control chronic pain and discomfort   Lacks social connections  Unable to perform IADLs  independently  Initial goal documentation         Plan:   The care management team will reach out to the patient again over the next 60 days.    Alto Denver RN, MSN, CCM Community Care Coordinator [place]   [facility] Mobile: [phone]

## 2019-07-20 ENCOUNTER — Emergency Department: Payer: Medicare Other

## 2019-07-20 ENCOUNTER — Encounter: Payer: Self-pay | Admitting: Radiology

## 2019-07-20 ENCOUNTER — Ambulatory Visit: Payer: Self-pay | Admitting: General Practice

## 2019-07-20 ENCOUNTER — Inpatient Hospital Stay
Admission: EM | Admit: 2019-07-20 | Discharge: 2019-08-02 | DRG: 551 | Disposition: A | Discharge status: Home Health | Payer: Medicare Other | Attending: Hospitalist | Admitting: Hospitalist

## 2019-07-20 ENCOUNTER — Other Ambulatory Visit: Payer: Self-pay

## 2019-07-20 DIAGNOSIS — R918 Other nonspecific abnormal finding of lung field: Secondary | ICD-10-CM | POA: Diagnosis present

## 2019-07-20 DIAGNOSIS — J441 Chronic obstructive pulmonary disease with (acute) exacerbation: Secondary | ICD-10-CM

## 2019-07-20 DIAGNOSIS — R05 Cough: Secondary | ICD-10-CM

## 2019-07-20 DIAGNOSIS — G2581 Restless legs syndrome: Secondary | ICD-10-CM | POA: Diagnosis present

## 2019-07-20 DIAGNOSIS — R079 Chest pain, unspecified: Secondary | ICD-10-CM

## 2019-07-20 DIAGNOSIS — I959 Hypotension, unspecified: Secondary | ICD-10-CM | POA: Diagnosis present

## 2019-07-20 DIAGNOSIS — J439 Emphysema, unspecified: Secondary | ICD-10-CM | POA: Diagnosis present

## 2019-07-20 DIAGNOSIS — F419 Anxiety disorder, unspecified: Secondary | ICD-10-CM | POA: Diagnosis present

## 2019-07-20 DIAGNOSIS — G919 Hydrocephalus, unspecified: Secondary | ICD-10-CM | POA: Diagnosis present

## 2019-07-20 DIAGNOSIS — Z8781 Personal history of (healed) traumatic fracture: Secondary | ICD-10-CM | POA: Diagnosis present

## 2019-07-20 DIAGNOSIS — S22060A Wedge compression fracture of T7-T8 vertebra, initial encounter for closed fracture: Secondary | ICD-10-CM | POA: Diagnosis not present

## 2019-07-20 DIAGNOSIS — R0902 Hypoxemia: Secondary | ICD-10-CM

## 2019-07-20 DIAGNOSIS — F331 Major depressive disorder, recurrent, moderate: Secondary | ICD-10-CM

## 2019-07-20 DIAGNOSIS — Z9981 Dependence on supplemental oxygen: Secondary | ICD-10-CM

## 2019-07-20 DIAGNOSIS — Z833 Family history of diabetes mellitus: Secondary | ICD-10-CM

## 2019-07-20 DIAGNOSIS — G9341 Metabolic encephalopathy: Secondary | ICD-10-CM | POA: Diagnosis present

## 2019-07-20 DIAGNOSIS — J9611 Chronic respiratory failure with hypoxia: Secondary | ICD-10-CM | POA: Diagnosis not present

## 2019-07-20 DIAGNOSIS — J449 Chronic obstructive pulmonary disease, unspecified: Secondary | ICD-10-CM | POA: Diagnosis not present

## 2019-07-20 DIAGNOSIS — Y92013 Bedroom of single-family (private) house as the place of occurrence of the external cause: Secondary | ICD-10-CM

## 2019-07-20 DIAGNOSIS — Z8249 Family history of ischemic heart disease and other diseases of the circulatory system: Secondary | ICD-10-CM

## 2019-07-20 DIAGNOSIS — Z885 Allergy status to narcotic agent status: Secondary | ICD-10-CM

## 2019-07-20 DIAGNOSIS — R140 Abdominal distension (gaseous): Secondary | ICD-10-CM | POA: Diagnosis present

## 2019-07-20 DIAGNOSIS — Z8349 Family history of other endocrine, nutritional and metabolic diseases: Secondary | ICD-10-CM

## 2019-07-20 DIAGNOSIS — Z982 Presence of cerebrospinal fluid drainage device: Secondary | ICD-10-CM

## 2019-07-20 DIAGNOSIS — R7303 Prediabetes: Secondary | ICD-10-CM | POA: Diagnosis present

## 2019-07-20 DIAGNOSIS — F514 Sleep terrors [night terrors]: Secondary | ICD-10-CM | POA: Diagnosis present

## 2019-07-20 DIAGNOSIS — F329 Major depressive disorder, single episode, unspecified: Secondary | ICD-10-CM | POA: Diagnosis present

## 2019-07-20 DIAGNOSIS — Z66 Do not resuscitate: Secondary | ICD-10-CM | POA: Diagnosis not present

## 2019-07-20 DIAGNOSIS — J9621 Acute and chronic respiratory failure with hypoxia: Secondary | ICD-10-CM | POA: Diagnosis present

## 2019-07-20 DIAGNOSIS — Z7952 Long term (current) use of systemic steroids: Secondary | ICD-10-CM

## 2019-07-20 DIAGNOSIS — F17210 Nicotine dependence, cigarettes, uncomplicated: Secondary | ICD-10-CM | POA: Diagnosis present

## 2019-07-20 DIAGNOSIS — Z886 Allergy status to analgesic agent status: Secondary | ICD-10-CM

## 2019-07-20 DIAGNOSIS — Z681 Body mass index (BMI) 19 or less, adult: Secondary | ICD-10-CM

## 2019-07-20 DIAGNOSIS — R7989 Other specified abnormal findings of blood chemistry: Secondary | ICD-10-CM | POA: Diagnosis present

## 2019-07-20 DIAGNOSIS — Z20822 Contact with and (suspected) exposure to covid-19: Secondary | ICD-10-CM | POA: Diagnosis present

## 2019-07-20 DIAGNOSIS — R0602 Shortness of breath: Secondary | ICD-10-CM

## 2019-07-20 DIAGNOSIS — R059 Cough, unspecified: Secondary | ICD-10-CM

## 2019-07-20 DIAGNOSIS — E872 Acidosis: Secondary | ICD-10-CM | POA: Diagnosis not present

## 2019-07-20 DIAGNOSIS — R471 Dysarthria and anarthria: Secondary | ICD-10-CM | POA: Diagnosis present

## 2019-07-20 DIAGNOSIS — Z79899 Other long term (current) drug therapy: Secondary | ICD-10-CM

## 2019-07-20 DIAGNOSIS — M545 Low back pain: Secondary | ICD-10-CM | POA: Diagnosis present

## 2019-07-20 DIAGNOSIS — R441 Visual hallucinations: Secondary | ICD-10-CM | POA: Diagnosis present

## 2019-07-20 DIAGNOSIS — J209 Acute bronchitis, unspecified: Secondary | ICD-10-CM | POA: Diagnosis present

## 2019-07-20 DIAGNOSIS — R946 Abnormal results of thyroid function studies: Secondary | ICD-10-CM | POA: Diagnosis not present

## 2019-07-20 DIAGNOSIS — R64 Cachexia: Secondary | ICD-10-CM | POA: Diagnosis present

## 2019-07-20 DIAGNOSIS — E43 Unspecified severe protein-calorie malnutrition: Secondary | ICD-10-CM | POA: Diagnosis present

## 2019-07-20 DIAGNOSIS — F05 Delirium due to known physiological condition: Secondary | ICD-10-CM | POA: Diagnosis present

## 2019-07-20 DIAGNOSIS — W06XXXA Fall from bed, initial encounter: Secondary | ICD-10-CM | POA: Diagnosis present

## 2019-07-20 LAB — LIPASE, BLOOD: Lipase: 108 U/L — ABNORMAL HIGH (ref 11–51)

## 2019-07-20 LAB — CBC WITH DIFFERENTIAL/PLATELET
Abs Immature Granulocytes: 0.06 10*3/uL (ref 0.00–0.07)
Basophils Absolute: 0 10*3/uL (ref 0.0–0.1)
Basophils Relative: 0 %
Eosinophils Absolute: 0 10*3/uL (ref 0.0–0.5)
Eosinophils Relative: 0 %
HCT: 46.7 % (ref 39.0–52.0)
Hemoglobin: 14.9 g/dL (ref 13.0–17.0)
Immature Granulocytes: 1 %
Lymphocytes Relative: 2 %
Lymphs Abs: 0.3 10*3/uL — ABNORMAL LOW (ref 0.7–4.0)
MCH: 31.1 pg (ref 26.0–34.0)
MCHC: 31.9 g/dL (ref 30.0–36.0)
MCV: 97.5 fL (ref 80.0–100.0)
Monocytes Absolute: 0.3 10*3/uL (ref 0.1–1.0)
Monocytes Relative: 2 %
Neutro Abs: 11.9 10*3/uL — ABNORMAL HIGH (ref 1.7–7.7)
Neutrophils Relative %: 95 %
Platelets: 315 10*3/uL (ref 150–400)
RBC: 4.79 MIL/uL (ref 4.22–5.81)
RDW: 12.5 % (ref 11.5–15.5)
WBC: 12.5 10*3/uL — ABNORMAL HIGH (ref 4.0–10.5)
nRBC: 0 % (ref 0.0–0.2)

## 2019-07-20 LAB — COMPREHENSIVE METABOLIC PANEL
ALT: 14 U/L (ref 0–44)
AST: 19 U/L (ref 15–41)
Albumin: 3.9 g/dL (ref 3.5–5.0)
Alkaline Phosphatase: 94 U/L (ref 38–126)
Anion gap: 10 (ref 5–15)
BUN: 29 mg/dL — ABNORMAL HIGH (ref 6–20)
CO2: 26 mmol/L (ref 22–32)
Calcium: 9.2 mg/dL (ref 8.9–10.3)
Chloride: 103 mmol/L (ref 98–111)
Creatinine, Ser: 1.2 mg/dL (ref 0.61–1.24)
GFR calc Af Amer: >60 mL/min (ref >60)
GFR calc non Af Amer: >60 mL/min (ref >60)
Glucose, Bld: 178 mg/dL — ABNORMAL HIGH (ref 70–99)
Potassium: 4.5 mmol/L (ref 3.5–5.1)
Sodium: 139 mmol/L (ref 135–145)
Total Bilirubin: 0.5 mg/dL (ref 0.3–1.2)
Total Protein: 7.5 g/dL (ref 6.5–8.1)

## 2019-07-20 LAB — CBC
HCT: 42 % (ref 39.0–52.0)
Hemoglobin: 13.4 g/dL (ref 13.0–17.0)
MCH: 31.2 pg (ref 26.0–34.0)
MCHC: 31.9 g/dL (ref 30.0–36.0)
MCV: 97.7 fL (ref 80.0–100.0)
Platelets: 219 10*3/uL (ref 150–400)
RBC: 4.3 MIL/uL (ref 4.22–5.81)
RDW: 12.5 % (ref 11.5–15.5)
WBC: 9.8 10*3/uL (ref 4.0–10.5)
nRBC: 0 % (ref 0.0–0.2)

## 2019-07-20 LAB — TROPONIN I (HIGH SENSITIVITY)
Troponin I (High Sensitivity): <2 ng/L (ref <18)
Troponin I (High Sensitivity): 3 ng/L (ref <18)

## 2019-07-20 LAB — BLOOD GAS, VENOUS
Acid-Base Excess: 3.9 mmol/L — ABNORMAL HIGH (ref 0.0–2.0)
Bicarbonate: 30.2 mmol/L — ABNORMAL HIGH (ref 20.0–28.0)
O2 Saturation: 89.1 %
Patient temperature: 37
pCO2, Ven: 51 mmHg (ref 44.0–60.0)
pH, Ven: 7.38 (ref 7.250–7.430)
pO2, Ven: 58 mmHg — ABNORMAL HIGH (ref 32.0–45.0)

## 2019-07-20 LAB — BRAIN NATRIURETIC PEPTIDE: B Natriuretic Peptide: 44 pg/mL (ref 0.0–100.0)

## 2019-07-20 MED ORDER — TRAMADOL HCL 50 MG PO TABS: 50.0000 mg | ORAL_TABLET | Freq: Four times a day (QID) | ORAL | Status: DC | PRN

## 2019-07-20 MED ORDER — IPRATROPIUM-ALBUTEROL 0.5-2.5 (3) MG/3ML IN SOLN
3.0000 mL | Freq: Once | RESPIRATORY_TRACT | Status: AC
  Administered 2019-07-20: 3 mL via RESPIRATORY_TRACT
  Filled 2019-07-20: qty 3

## 2019-07-20 MED ORDER — ALBUTEROL SULFATE (2.5 MG/3ML) 0.083% IN NEBU
2.5000 mg | INHALATION_SOLUTION | RESPIRATORY_TRACT | Status: DC | PRN
  Administered 2019-07-21 (×2): 2.5 mg via RESPIRATORY_TRACT
  Filled 2019-07-20 (×2): qty 3

## 2019-07-20 MED ORDER — METHYLPREDNISOLONE SODIUM SUCC 125 MG IJ SOLR
60.0000 mg | Freq: Two times a day (BID) | INTRAMUSCULAR | Status: AC
  Administered 2019-07-21: 60 mg via INTRAVENOUS
  Filled 2019-07-20: qty 2

## 2019-07-20 MED ORDER — PREDNISONE 20 MG PO TABS
40.0000 mg | ORAL_TABLET | Freq: Every day | ORAL | Status: DC
  Administered 2019-07-22: 40 mg via ORAL
  Filled 2019-07-20: qty 2

## 2019-07-20 MED ORDER — SODIUM CHLORIDE 0.9 % IV SOLN
500.0000 mg | Freq: Once | INTRAVENOUS | Status: AC
  Administered 2019-07-20: 500 mg via INTRAVENOUS
  Filled 2019-07-20: qty 500

## 2019-07-20 MED ORDER — IPRATROPIUM-ALBUTEROL 0.5-2.5 (3) MG/3ML IN SOLN
3.0000 mL | Freq: Four times a day (QID) | RESPIRATORY_TRACT | Status: DC
  Administered 2019-07-20 – 2019-07-21 (×2): 3 mL via RESPIRATORY_TRACT
  Filled 2019-07-20 (×2): qty 3

## 2019-07-20 MED ORDER — KETOROLAC TROMETHAMINE 30 MG/ML IJ SOLN: 30.0000 mg | Freq: Once | INTRAMUSCULAR | Status: DC

## 2019-07-20 MED ORDER — IOHEXOL 350 MG/ML SOLN
75.0000 mL | Freq: Once | INTRAVENOUS | Status: AC | PRN
  Administered 2019-07-20: 75 mL via INTRAVENOUS

## 2019-07-20 MED ORDER — METHYLPREDNISOLONE SODIUM SUCC 125 MG IJ SOLR
125.0000 mg | Freq: Once | INTRAMUSCULAR | Status: AC
  Administered 2019-07-20: 125 mg via INTRAVENOUS
  Filled 2019-07-20: qty 2

## 2019-07-20 MED ORDER — KETOROLAC TROMETHAMINE 30 MG/ML IJ SOLN
30.0000 mg | Freq: Once | INTRAMUSCULAR | Status: AC
  Administered 2019-07-20: 30 mg via INTRAVENOUS
  Filled 2019-07-20: qty 1

## 2019-07-20 MED ORDER — SODIUM CHLORIDE 0.9 % IV SOLN: INTRAVENOUS | Status: AC

## 2019-07-20 MED ORDER — MORPHINE SULFATE (PF) 4 MG/ML IV SOLN
4.0000 mg | Freq: Once | INTRAVENOUS | Status: AC
  Administered 2019-07-20: 4 mg via INTRAVENOUS
  Filled 2019-07-20: qty 1

## 2019-07-20 MED ORDER — ENOXAPARIN SODIUM 40 MG/0.4ML ~~LOC~~ SOLN
40.0000 mg | SUBCUTANEOUS | Status: DC
  Administered 2019-07-20 – 2019-08-01 (×13): 40 mg via SUBCUTANEOUS
  Filled 2019-07-20 (×13): qty 0.4

## 2019-07-20 NOTE — H&P (Signed)
History and Physical    Austin Blevins ZYS:[id number] DOB: January 25, 1961 DOA: 07/20/2019  PCP: [name], NP   Patient coming from: home I have personally briefly reviewed patient's old medical records in [facility]  Chief Complaint: Shortness of breath and chest pain  HPI: Austin Blevins is a 59 y.o. male with medical history significant for hydrocephalus with known VP shunt, severe COPD with chronic respiratory failure on home O2 at 3 L, anxiety and restless leg syndrome who presented to the emergency room with severe respiratory distress and complaint of left  lateral chest pain and mid to lower back pain. Symptoms have been ongoing for 4 days. Pain is worse with movement and coughing and is of moderate intensity described as sharp and nonradiating. He has no associated nausea vomiting or diaphoresis. Shortness of breath worsens the chest pain.  He denies a fall. He denies fever, chills, abdominal pain, nausea vomiting or change in bowel movement.  He saw his pulmonologist a few days prior for follow-up and received a prescription for steroid taper pack and tramadol for the back pain. ED Course: On arrival to the emergency room, per ED provider he was initially very tachypneic at 30, using accessory muscles to breathe. O2 sat maintained at 90% on his home flow rate at 3 L. He was also tachycardic at 134 but afebrile. BP 172/86. On his blood work, BNP 44 troponin less than 2, white cell count 12,500 with otherwise unremarkable blood work. EKG showed no acute ST-T wave changes. He had a CTA of his chest which was negative for PE but showed advanced emphysema and stable pulmonary nodules also showed a new T7 fracture. Patient was given DuoNeb treatments in the emergency room as well as IV steroids with significant improvement in his breathing. Hospitalist consulted for admission  Review of Systems: As per HPI otherwise 10 point review of systems negative.    Past Medical History:  Diagnosis  Date  . Anxiety   . COPD (chronic obstructive pulmonary disease) (HCC)   . Depression   . Hydrocephalus [facility])     Past Surgical History:  Procedure Laterality Date  . BRAIN SURGERY  2003   VA shunt for hydrocephalus  . ventricular shunt       reports that he has been smoking cigarettes. He has a 40.00 pack-year smoking history. He has never used smokeless tobacco. He reports that he does not drink alcohol or use drugs.  Allergies  Allergen Reactions  . Acetaminophen Other (See Comments)    Syncope  . Percocet [Oxycodone-Acetaminophen]     Family History  Problem Relation Age of Onset  . Diabetes Mellitus II Mother   . CAD Father   . Hypertension Father   . Arthritis/Rheumatoid Sister   . Hyperlipidemia Sister   . Hypertension Sister   . Depression Sister   . Anxiety disorder Sister   . Diabetes Sister   . Heart disease Brother   . Other Brother        DDD  . Diabetes Sister   . Diabetes Sister   . Arthritis/Rheumatoid Brother   . Other Brother        DDD     Prior to Admission medications   Medication Sig Start Date End Date Taking? Authorizing Provider  albuterol (PROVENTIL) (2.5 MG/3ML) 0.083% nebulizer solution USE ONE VIAL IN NEBULIZER EVERY SIX HOURS AS NEEDED FOR WHEEZING OR SHORTNESS OF BREATH 02/08/19   [name], [name] T, NP  albuterol (VENTOLIN HFA)  108 (90 Base) MCG/ACT inhaler INHALE 2 PUFFS INTO THE LUNGS EVERY 4 HOURS AS NEEDED FOR WHEEZING OR SHORTNESS OF BREATH 05/25/19   [name], Jolene T, NP  busPIRone (BUSPAR) 10 MG tablet TAKE ONE TABLET TWICE DAILY 06/22/19   [name], Jolene T, NP  Fluticasone-Salmeterol (ADVAIR DISKUS) 250-50 MCG/DOSE AEPB Inhale 1 puff into the lungs 2 (two) times daily. 10/03/18   [name], Henrine Screws T, NP  HYDROcodone-homatropine (HYCODAN) 5-1.5 MG/5ML syrup Take 5 mLs by mouth every 8 (eight) hours as needed. 06/22/19   [provider]  montelukast (SINGULAIR) 10 MG tablet Take 1 tablet (10 mg total) by mouth at bedtime.  04/28/19   [name], Henrine Screws T, NP  omeprazole (PRILOSEC) 20 MG capsule TAKE 1 CAPSULE BY MOUTH EVERY DAY 02/20/19   [name], Jolene T, NP  predniSONE (DELTASONE) 10 MG tablet Take 10 mg by mouth daily with breakfast.    [provider]  rOPINIRole (REQUIP) 0.5 MG tablet TAKE ONE TABLET EVERY MORNING AND TAKE THREE TABLETS AT BEDTIME AS NEEDED FOR RESTLESS LEG SYNDROME 05/25/19   [name], Henrine Screws T, NP  sertraline (ZOLOFT) 100 MG tablet Take 1.5 tablets (150 mg total) by mouth daily. 07/03/19   [name], Jolene T, NP  SPIRIVA HANDIHALER 18 MCG inhalation capsule INHALE CONTENTS OF 1 CAPSULE AS DIRECTEDEVERY DAY WITH HANDIHALER 03/23/19   [name], Jolene T, NP  albuterol (PROVENTIL HFA;VENTOLIN HFA) 108 (90 Base) MCG/ACT inhaler Inhale 2 puffs into the lungs every 4 (four) hours as needed for wheezing or shortness of breath. 08/02/18   [name], Henrine Screws T, NP  albuterol (VENTOLIN HFA) 108 (90 Base) MCG/ACT inhaler INHALE 2 PUFFS INTO THE LUNGS EVERY 4 HOURS AS NEEDED FOR WHEEZING OR SHORTNESS OF BREATH 02/23/19   [name], Jolene T, NP  rOPINIRole (REQUIP) 0.5 MG tablet TAKE ONE TABLET BY MOUTH EVERY MORNING AND 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 04/24/19   Marnee Guarneri T, NP    Physical Exam: Vitals:   07/20/19 1700 07/20/19 1730 07/20/19 1800 07/20/19 1815  BP:    130/83  Pulse: (!) 122 (!) 104 (!) 113 (!) 109  Resp: (!) 24     Temp:      TempSrc:      SpO2: 97% 98% 96% 96%  Weight:      Height:         Vitals:   07/20/19 1700 07/20/19 1730 07/20/19 1800 07/20/19 1815  BP:    130/83  Pulse: (!) 122 (!) 104 (!) 113 (!) 109  Resp: (!) 24     Temp:      TempSrc:      SpO2: 97% 98% 96% 96%  Weight:      Height:        Constitutional: Appearance,  Alert and awake, oriented x3, not in any acute distress. Eyes: PERLA, EOMI, irises appear normal, anicteric sclera,  ENMT: external ears and nose appear normal, normal hearing             Lips appears normal, oropharynx  mucosa, tongue, posterior pharynx appear normal  Neck: neck appears normal, no masses, normal ROM, no thyromegaly, no JVD  CVS: S1-S2 clear, no murmur rubs or gallops, no LE edema, normal pedal pulses , no carotid bruits Respiratory: Diminished bilaterally few rhonchi. Respiratory effort normal. No accessory muscle use.  Abdomen: soft nontender, mildly distended, normal bowel sounds, no hepatosplenomegaly, no hernias Musculoskeletal: : no cyanosis, clubbing , no contractures or atrophy Neuro: Cranial nerves II-XII intact, strength, sensation,  reflexes Psych: judgement and insight appear normal, stable mood and affect,  Skin: no rashes or lesions or ulcers, no induration or nodules   Labs on Admission: I have personally reviewed following labs and imaging studies  CBC: Recent Labs  Lab 07/20/19 1650  WBC 12.5*  NEUTROABS 11.9*  HGB 14.9  HCT 46.7  MCV 97.5  PLT 315   Basic Metabolic Panel: Recent Labs  Lab 07/20/19 1650  NA 139  K 4.5  CL 103  CO2 26  GLUCOSE 178*  BUN 29*  CREATININE 1.20  CALCIUM 9.2   GFR: Estimated Creatinine Clearance: 53.1 mL/min (by C-G formula based on SCr of 1.2 mg/dL). Liver Function Tests: Recent Labs  Lab 07/20/19 1650  AST 19  ALT 14  ALKPHOS 94  BILITOT 0.5  PROT 7.5  ALBUMIN 3.9   Recent Labs  Lab 07/20/19 1650  LIPASE 108*   No results for input(s): AMMONIA in the last 168 hours. Coagulation Profile: No results for input(s): INR, PROTIME in the last 168 hours. Cardiac Enzymes: No results for input(s): CKTOTAL, CKMB, CKMBINDEX, TROPONINI in the last 168 hours. BNP (last 3 results) No results for input(s): PROBNP in the last 8760 hours. HbA1C: No results for input(s): HGBA1C in the last 72 hours. CBG: No results for input(s): GLUCAP in the last 168 hours. Lipid Profile: No results for input(s): CHOL, HDL, LDLCALC, TRIG, CHOLHDL, LDLDIRECT in the last 72 hours. Thyroid Function Tests: No results for input(s): TSH,  T4TOTAL, FREET4, T3FREE, THYROIDAB in the last 72 hours. Anemia Panel: No results for input(s): VITAMINB12, FOLATE, FERRITIN, TIBC, IRON, RETICCTPCT in the last 72 hours. Urine analysis: No results found for: COLORURINE, APPEARANCEUR, LABSPEC, PHURINE, GLUCOSEU, HGBUR, BILIRUBINUR, KETONESUR, PROTEINUR, UROBILINOGEN, NITRITE, LEUKOCYTESUR  Radiological Exams on Admission: CT Angio Chest PE W/Cm &/Or Wo Cm  Result Date: 07/20/2019 CLINICAL DATA:  Pleuritic chest pain. Also reports falls. EXAM: CT ANGIOGRAPHY CHEST WITH CONTRAST TECHNIQUE: Multidetector CT imaging of the chest was performed using the standard protocol during bolus administration of intravenous contrast. Multiplanar CT image reconstructions and MIPs were obtained to evaluate the vascular anatomy. CONTRAST:  41mL OMNIPAQUE IOHEXOL 350 MG/ML SOLN COMPARISON:  Radiograph earlier this day. Chest CT 3 weeks ago 06/29/2019 FINDINGS: Cardiovascular: There are no filling defects within the pulmonary arteries to suggest pulmonary embolus. Aortic atherosclerosis without aneurysm or dissection. Heart is normal in size. No pericardial effusion. Mediastinum/Nodes: No mediastinal or hilar adenopathy. No thyroid nodule. No esophageal wall thickening. Lungs/Pleura: Advanced emphysema. Chronic left lung scarring, left fibrothorax, and adjacent round atelectasis. This is unchanged from prior exam. Right lower lobe perifissural nodule measures 11 x 7 mm, previously 12 x 7 mm, series 7, image 46. 5 mm right upper lobe pulmonary nodule, series 7, image 31, also unchanged. Left apical pulmonary nodule measuring 5 mm, unchanged, series 7, image 26. Nodular right apical scarring is unchanged. No new nodule. Trachea and mainstem bronchi are patent. Lower lobe bronchial thickening with areas of mucous plugging in the left lower lobe, stable. No pulmonary edema. No pneumothorax or new pleural effusion. Upper Abdomen: No acute findings. Musculoskeletal: Mild T7 inferior  endplate compression fracture is new from CT last month. No acute or subacute rib fractures. Review of the MIP images confirms the above findings. IMPRESSION: 1. No pulmonary embolus. 2. Advanced emphysema with chronic left lung scarring, left fibrothorax, and adjacent round atelectasis. 3. Unchanged bilateral pulmonary nodules, largest in the right lower lobe measuring 11 x 7 mm. Follow-up recommended per prior  recommendations. 4. Mild T7 inferior endplate compression fracture is new from CT last month. No acute or subacute rib fractures. Aortic Atherosclerosis (ICD10-I70.0) and Emphysema (ICD10-J43.9). Electronically Signed   By: Narda Rutherford M.D.   On: 07/20/2019 18:56   DG Chest Port 1 View  Result Date: 07/20/2019 CLINICAL DATA:  Chest pain and shortness of breath. EXAM: PORTABLE CHEST 1 VIEW COMPARISON:  Chest CT 06/29/2019, radiograph 04/07/2018 FINDINGS: Emphysema and chronic hyperinflation. Chronic left pleuroparenchymal scarring and pleural effusion. Chronic blunting of the right costophrenic angle likely combination of pleural thickening and hyperinflation. Right lower lobe pulmonary nodule on CT not well visualized radiographically. Unchanged heart size and mediastinal contours. No evidence of acute airspace disease. No pneumothorax. Right neck catheter unchanged. Stable osseous structures. IMPRESSION: Chronic emphysema, hyperinflation, left pleuroparenchymal scarring and pleural effusion. No acute abnormality. Emphysema (ICD10-J43.9). Electronically Signed   By: Narda Rutherford M.D.   On: 07/20/2019 17:35    EKG: Independently reviewed.   Assessment/Plan Principal Problem:   COPD with acute exacerbation (HCC)   Chronic respiratory failure with hypoxia (HCC)   Pulmonary nodules -DuoNebs, scheduled with as needed albuterol -IV Solu-Medrol to transition to oral prednisone as patient improves -Supplemental oxygen to keep sats over 90% -Mucolytic's as needed -CT showed advanced  emphysema and stable pulmonary nodules. Ruled out PE -Patient was seen by his pulmonologist, Dr. Darletta Moll on 07/17/2019 was prescribed a prednisone taper and tramadol as well for his pain at the time.    Chest pain at rest -Pain was more pleuritic in nature, likely related to T7 fracture and increased work of breathing -Troponin less than 2 and EKG with no acute abnormalities. BNP 44 -Pain control, preferably nonnarcotic if tolerated  Anxiety -Continue BuSpar  Hydrocephalus with history of VP shunt -No acute concerns at this time    DVT prophylaxis: Lovenox  Code Status: full code  Family Communication:  none  Disposition Plan: Back to previous home environment Consults called: none  Status:obs    Andris Baumann MD [facility]     07/20/2019, 7:25 PM

## 2019-07-20 NOTE — ED Triage Notes (Signed)
Pt comes POV shaking and SOB from the abdominal pain. Pt states it started 4 days ago. Pt chronically on 3 L [facility].

## 2019-07-20 NOTE — ED Notes (Signed)
PT c/o continued pain. Informed MD. Awaiting new orders.

## 2019-07-20 NOTE — Patient Instructions (Signed)
Visit Information  Goals Addressed            This Visit's Progress   . RNCM: Pt-"I need help with controlling this pain" (pt-stated)       CARE PLAN ENTRY (see longtitudinal plan of care for additional care plan information)  Current Barriers:  [name] Kitchen Knowledge Deficits related to pain control in a patient with complex medical conditions with Chronic pain that is getting worse per the patient  . Lacks caregiver support.  . Corporate treasurer.  . Anxiety attacks related to pain   Nurse Case Manager Clinical Goal(s):  [name] Kitchen Over the next 90 days, patient will verbalize understanding of plan for managing chronic pain and discomfort  . Over the next 90 days, patient will work with [facility], CCM team, and pcp  to address needs related to worsening Chronic pain and discomfort and the patient verbalizing he needs assistance with a pain specialist/clinic referral or other resources to help in managing his pain and discomfort . Over the next 90 days, patient will demonstrate a decrease in pain  exacerbations as evidenced by stated decrease in pain level and discomfort   Interventions:  . Evaluation of current treatment plan related to pain management  and patient's adherence to plan as established by provider. . Provided education to patient re: discussing pain concerns with pcp . Reviewed medications with patient and discussed compliance  . Collaborated with pcp and  CCM pharmacist regarding uncontrolled chronic pain and the patients request for a referral to a pain specialist/clinic or other recommendations for helping with pain control  . Called Dr. Reita Cliche office and spoke to the staff. The staff is faxing over the xray results, lab results, and the office visit note from recent visit for the pcp.  The staff is also sending a message to have Dr. Reita Cliche nurse call the [facility] to discuss and collaborate care for the patient. . Advised front office staff to watch for fax from Dr. Reita Cliche office and  let Austin Blevins know when it was available . Spoke to the patient and advised the patient to call Dr. Reita Cliche office and let them know about the changes he is experiencing. Also advised the patient for worsening shob or unrelieved pain to go to the ED to be evaluated. The patient verbalized understanding.  [name] Kitchen Received a call back from [facility] the nurse with Dr. Meredeth Ide.  Morrie Sheldon reported that Dr. Meredeth Ide could not find any acute findings that would be causing the increased pain and discomfort. He discussed with the patient a referral back to the pcp office for a referral for pain specialist evaluation for possibly shots to help with pain. Also she said Dr. Meredeth Ide feels a psychiatric referral would be beneficial for the patient.  Dr. Meredeth Ide did prescribe Buspar for the patient to take.  [name] Kitchen Received fax from Dr. Reita Cliche office. Staff has placed in Austin Blevins's book for review.    Patient Self Care Activities:  . Patient verbalizes understanding of plan to discuss pain control options with the interdisciplinary team for recommendations and resources  . Self administers medications as prescribed . Attends all scheduled provider appointments . Performs IADL's independently . Calls provider office for new concerns or questions . Unable to independently control chronic pain and discomfort  . Lacks social connections . Unable to perform IADLs independently  Please see past updates related to this goal by clicking on the "Past Updates" button in the selected goal         Patient verbalizes understanding  of instructions provided today.   The care management team will reach out to the patient again over the next 30 days.   Noreene Larsson RN, MSN, [facility] Mobile: [phone]

## 2019-07-20 NOTE — Chronic Care Management (AMB) (Signed)
Chronic Care Management   Follow Up Note   07/20/2019 Name: Austin Blevins MRN: [id number] DOB: 06-13-1960  Referred by: [name], NP Reason for referral : Chronic Care Management (Text message asking for a call back/Call to Dr. Raul Del office to get records/Chronic COPD/Anxiety/Pain)   DMARION PERFECT is a 59 y.o. year old male who is a primary care patient of [name], [name], NP. The CCM team was consulted for assistance with chronic disease management and care coordination needs.    Review of patient status, including review of consultants reports, relevant laboratory and other test results, and collaboration with appropriate care team members and the patient's provider was performed as part of comprehensive patient evaluation and provision of chronic care management services.    SDOH (Social Determinants of Health) assessments performed: Yes See Care Plan activities for detailed interventions related to [place])     Facility-Administered Encounter Medications as of 07/20/2019  Medication  . ipratropium-albuterol (DUONEB) 0.5-2.5 (3) MG/3ML nebulizer solution 3 mL  . ipratropium-albuterol (DUONEB) 0.5-2.5 (3) MG/3ML nebulizer solution 3 mL  . ipratropium-albuterol (DUONEB) 0.5-2.5 (3) MG/3ML nebulizer solution 3 mL  . methylPREDNISolone sodium succinate (SOLU-MEDROL) 125 mg/2 mL injection 125 mg   Outpatient Encounter Medications as of 07/20/2019  Medication Sig Note  . albuterol (PROVENTIL) (2.5 MG/3ML) 0.083% nebulizer solution USE ONE VIAL IN NEBULIZER EVERY SIX HOURS AS NEEDED FOR WHEEZING OR SHORTNESS OF BREATH   . albuterol (VENTOLIN HFA) 108 (90 Base) MCG/ACT inhaler INHALE 2 PUFFS INTO THE LUNGS EVERY 4 HOURS AS NEEDED FOR WHEEZING OR SHORTNESS OF BREATH 07/04/2019: Using at least QID  . busPIRone (BUSPAR) 10 MG tablet TAKE ONE TABLET TWICE DAILY   . Fluticasone-Salmeterol (ADVAIR DISKUS) 250-50 MCG/DOSE AEPB Inhale 1 puff into the lungs 2 (two) times daily.   [name] Kitchen  HYDROcodone-homatropine (HYCODAN) 5-1.5 MG/5ML syrup Take 5 mLs by mouth every 8 (eight) hours as needed.   . montelukast (SINGULAIR) 10 MG tablet Take 1 tablet (10 mg total) by mouth at bedtime.   [name] Kitchen omeprazole (PRILOSEC) 20 MG capsule TAKE 1 CAPSULE BY MOUTH EVERY DAY   . predniSONE (DELTASONE) 10 MG tablet Take 10 mg by mouth daily with breakfast.   . rOPINIRole (REQUIP) 0.5 MG tablet TAKE ONE TABLET EVERY MORNING AND TAKE THREE TABLETS AT BEDTIME AS NEEDED FOR RESTLESS LEG SYNDROME   . sertraline (ZOLOFT) 100 MG tablet Take 1.5 tablets (150 mg total) by mouth daily.   [name] Kitchen SPIRIVA HANDIHALER 18 MCG inhalation capsule INHALE CONTENTS OF 1 CAPSULE AS DIRECTEDEVERY DAY WITH HANDIHALER   . [DISCONTINUED] albuterol (PROVENTIL HFA;VENTOLIN HFA) 108 (90 Base) MCG/ACT inhaler Inhale 2 puffs into the lungs every 4 (four) hours as needed for wheezing or shortness of breath. 12/13/2018: Using 4x day  . [DISCONTINUED] albuterol (VENTOLIN HFA) 108 (90 Base) MCG/ACT inhaler INHALE 2 PUFFS INTO THE LUNGS EVERY 4 HOURS AS NEEDED FOR WHEEZING OR SHORTNESS OF BREATH   . [DISCONTINUED] rOPINIRole (REQUIP) 0.5 MG tablet TAKE ONE TABLET BY MOUTH EVERY MORNING AND 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      Objective:   Goals Addressed            This Visit's Progress   . RNCM: Pt-"I need help with controlling this pain" (pt-stated)       CARE PLAN ENTRY (see longtitudinal plan of care for additional care plan information)  Current Barriers:  [name] Kitchen Knowledge Deficits related to pain control in a patient with complex medical  conditions with Chronic pain that is getting worse per the patient  . Lacks caregiver support.  . Corporate treasurer.  . Anxiety attacks related to pain   Nurse Case Manager Clinical Goal(s):  [name] Kitchen Over the next 90 days, patient will verbalize understanding of plan for managing chronic pain and discomfort  . Over the next 90 days, patient will work with [facility], CCM team, and  pcp  to address needs related to worsening Chronic pain and discomfort and the patient verbalizing he needs assistance with a pain specialist/clinic referral or other resources to help in managing his pain and discomfort . Over the next 90 days, patient will demonstrate a decrease in pain  exacerbations as evidenced by stated decrease in pain level and discomfort   Interventions:  . Evaluation of current treatment plan related to pain management  and patient's adherence to plan as established by provider. . Provided education to patient re: discussing pain concerns with pcp . Reviewed medications with patient and discussed compliance  . Collaborated with pcp and  CCM pharmacist regarding uncontrolled chronic pain and the patients request for a referral to a pain specialist/clinic or other recommendations for helping with pain control  . Called Dr. Reita Cliche office and spoke to the staff. The staff is faxing over the xray results, lab results, and the office visit note from recent visit for the pcp.  The staff is also sending a message to have Dr. Reita Cliche nurse call the [facility] to discuss and collaborate care for the patient. . Advised front office staff to watch for fax from Dr. Reita Cliche office and let Jolene know when it was available . Spoke to the patient and advised the patient to call Dr. Reita Cliche office and let them know about the changes he is experiencing. Also advised the patient for worsening shob or unrelieved pain to go to the ED to be evaluated. The patient verbalized understanding.  [name] Kitchen Received a call back from [facility] the nurse with Dr. Meredeth Ide.  Morrie Sheldon reported that Dr. Meredeth Ide could not find any acute findings that would be causing the increased pain and discomfort. He discussed with the patient a referral back to the pcp office for a referral for pain specialist evaluation for possibly shots to help with pain. Also she said Dr. Meredeth Ide feels a psychiatric referral would be beneficial for  the patient.  Dr. Meredeth Ide did prescribe Buspar for the patient to take.  [name] Kitchen Received fax from Dr. Reita Cliche office. Staff has placed in Jolene's book for review.    Patient Self Care Activities:  . Patient verbalizes understanding of plan to discuss pain control options with the interdisciplinary team for recommendations and resources  . Self administers medications as prescribed . Attends all scheduled provider appointments . Performs IADL's independently . Calls provider office for new concerns or questions . Unable to independently control chronic pain and discomfort  . Lacks social connections . Unable to perform IADLs independently  Please see past updates related to this goal by clicking on the "Past Updates" button in the selected goal          Plan:   The care management team will reach out to the patient again over the next 30 days.    Alto Denver RN, MSN, CCM Community Care Coordinator [place]  [facility] Mobile: [phone]

## 2019-07-20 NOTE — ED Notes (Signed)
Rainbow and gray on ice have been sent to lab.

## 2019-07-20 NOTE — ED Notes (Signed)
Pt transported for CT 

## 2019-07-20 NOTE — ED Provider Notes (Signed)
[facility] Emergency Department Provider Note  ____________________________________________   First MD Initiated Contact with Patient 07/20/19 1705     (approximate)  I have reviewed the triage vital signs and the nursing notes.  History  Chief Complaint Shortness of Breath and Abdominal Pain    HPI Austin Blevins is a 59 y.o. male with history of hydrocephalus status post VP shunt, severe COPD on baseline 3 L nasal cannula, chronic pancreatitis, restless leg who presents to the emergency department for shortness of breath, cough, bilateral posteriolateral mid thoracic chest and back pain.  Patient states symptoms have been ongoing for the past 4 days.  Constant since onset.  He has particular pain to the left lateral chest wall area.  Describes it as a sharp/catching type sensation, worse with movement and coughing.  Severe.  No radiation.  No apparent alleviating components.  Also reports worsening shortness of breath over this time period.  Used his albuterol inhaler multiple times today without significant improvement.  He states he cannot walk even short distances without becoming extremely winded, which is atypical for him even the setting of his COPD.  Chronic cough.  No known fevers.  States he feels like his stomach is somewhat more bloated than normal, no vomiting.  No diarrhea.  Normal daily bowel movements.  Also reports recently being started on a new medication that gives him vivid dreams, almost sleepwalking.  He has fallen out of bed several times because of this, and wonders if he could have possibly fallen onto his side and broken a rib is potential etiology for his pain.   Past Medical Hx Past Medical History:  Diagnosis Date  . Anxiety   . COPD (chronic obstructive pulmonary disease) (HCC)   . Depression   . Hydrocephalus [facility])     Problem List Patient Active Problem List   Diagnosis Date Noted  . Right upper lobe pulmonary nodule  05/27/2019  . Low back pain 12/21/2018  . DNR (do not resuscitate) 10/24/2018  . Restless legs syndrome (RLS) 08/02/2018  . Gastroesophageal reflux disease 08/02/2018  . Rotator cuff dysfunction, left 07/19/2018  . Prediabetes 07/17/2018  . Nicotine dependence, cigarettes, w unsp disorders 05/25/2017  . Chronic pancreatitis (HCC) 05/25/2017  . Stage 3 severe COPD by GOLD classification (HCC) 02/11/2017  . Protein-calorie malnutrition, severe 01/22/2017  . Depression, major, recurrent, moderate (HCC) 04/11/2014    Past Surgical Hx Past Surgical History:  Procedure Laterality Date  . BRAIN SURGERY  2003   VA shunt for hydrocephalus  . ventricular shunt      Medications Prior to Admission medications   Medication Sig Start Date End Date Taking? Authorizing Provider  albuterol (PROVENTIL) (2.5 MG/3ML) 0.083% nebulizer solution USE ONE VIAL IN NEBULIZER EVERY SIX HOURS AS NEEDED FOR WHEEZING OR SHORTNESS OF BREATH 02/08/19   [name], Jolene T, NP  albuterol (VENTOLIN HFA) 108 (90 Base) MCG/ACT inhaler INHALE 2 PUFFS INTO THE LUNGS EVERY 4 HOURS AS NEEDED FOR WHEEZING OR SHORTNESS OF BREATH 05/25/19   [name], Jolene T, NP  busPIRone (BUSPAR) 10 MG tablet TAKE ONE TABLET TWICE DAILY 06/22/19   [name], Jolene T, NP  Fluticasone-Salmeterol (ADVAIR DISKUS) 250-50 MCG/DOSE AEPB Inhale 1 puff into the lungs 2 (two) times daily. 10/03/18   [name], [name] T, NP  HYDROcodone-homatropine (HYCODAN) 5-1.5 MG/5ML syrup Take 5 mLs by mouth every 8 (eight) hours as needed. 06/22/19   [provider]  montelukast (SINGULAIR) 10 MG tablet Take 1 tablet (10 mg  total) by mouth at bedtime. 04/28/19   [name], [name] T, NP  omeprazole (PRILOSEC) 20 MG capsule TAKE 1 CAPSULE BY MOUTH EVERY DAY 02/20/19   [name], Jolene T, NP  predniSONE (DELTASONE) 10 MG tablet Take 10 mg by mouth daily with breakfast.    [provider]  rOPINIRole (REQUIP) 0.5 MG tablet TAKE ONE TABLET EVERY MORNING AND TAKE  THREE TABLETS AT BEDTIME AS NEEDED FOR RESTLESS LEG SYNDROME 05/25/19   [name], [name] T, NP  sertraline (ZOLOFT) 100 MG tablet Take 1.5 tablets (150 mg total) by mouth daily. 07/03/19   [name], Jolene T, NP  SPIRIVA HANDIHALER 18 MCG inhalation capsule INHALE CONTENTS OF 1 CAPSULE AS DIRECTEDEVERY DAY WITH HANDIHALER 03/23/19   [name], Jolene T, NP  albuterol (PROVENTIL HFA;VENTOLIN HFA) 108 (90 Base) MCG/ACT inhaler Inhale 2 puffs into the lungs every 4 (four) hours as needed for wheezing or shortness of breath. 08/02/18   [name], [name] T, NP  albuterol (VENTOLIN HFA) 108 (90 Base) MCG/ACT inhaler INHALE 2 PUFFS INTO THE LUNGS EVERY 4 HOURS AS NEEDED FOR WHEEZING OR SHORTNESS OF BREATH 02/23/19   [name], Jolene T, NP  rOPINIRole (REQUIP) 0.5 MG tablet TAKE ONE TABLET BY MOUTH EVERY MORNING AND 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 04/24/19   Aura Dials T, NP    Allergies Acetaminophen and Percocet [oxycodone-acetaminophen]  Family Hx Family History  Problem Relation Age of Onset  . Diabetes Mellitus II Mother   . CAD Father   . Hypertension Father   . Arthritis/Rheumatoid Sister   . Hyperlipidemia Sister   . Hypertension Sister   . Depression Sister   . Anxiety disorder Sister   . Diabetes Sister   . Heart disease Brother   . Other Brother        DDD  . Diabetes Sister   . Diabetes Sister   . Arthritis/Rheumatoid Brother   . Other Brother        DDD    Social Hx Social History   Tobacco Use  . Smoking status: Current Every Day Smoker    Packs/day: 1.00    Years: 40.00    Pack years: 40.00    Types: Cigarettes  . Smokeless tobacco: Never Used  . Tobacco comment: Informed patient that we can give him materials when he is ready to quit.  Substance Use Topics  . Alcohol use: No  . Drug use: No     Review of Systems  Constitutional: Negative for fever, chills. Eyes: Negative for visual changes. ENT: Negative for sore throat. Cardiovascular:  Positive for chest pain. Respiratory: Positive for shortness of breath. Gastrointestinal: Negative for nausea, vomiting.  Genitourinary: Negative for dysuria. Musculoskeletal: Negative for leg swelling. Skin: Negative for rash. Neurological: Negative for headaches.   Physical Exam  Vital Signs: ED Triage Vitals  Enc Vitals Group     BP 07/20/19 1650 (!) 172/86     Pulse Rate 07/20/19 1650 (!) 134     Resp 07/20/19 1650 (!) 30     Temp 07/20/19 1650 97.7 F (36.5 C)     Temp Source 07/20/19 1650 Axillary     SpO2 07/20/19 1650 90 %     Weight 07/20/19 1652 122 lb (55.3 kg)     Height 07/20/19 1652 5\' 7"  (1.702 m)     Head Circumference --      Peak Flow --      Pain Score 07/20/19 1652 8     Pain Loc --  Pain Edu? --      Excl. in GC? --     Constitutional: Alert and oriented.  Thin and frail appearing. Head: Normocephalic. Atraumatic. Eyes: Conjunctivae clear, sclera anicteric. Pupils equal and symmetric. Nose: No masses or lesions. No congestion or rhinorrhea. Mouth/Throat: Wearing mask.  Neck: No stridor. Trachea midline.  Cardiovascular: Normal rate, regular rhythm. Extremities well perfused. Respiratory: Tachypneic with increased work of breathing.  On 3 L nasal cannula.  Bilateral coarse lung sounds and expiratory wheezing throughout.  Accessory muscle use. Chest: Bilateral, lateral chest wall tenderness.  No appreciable step-offs or deformities. Gastrointestinal: Soft. Non-distended. Non-tender.  Genitourinary: Deferred. Musculoskeletal: No lower extremity edema. No deformities. Neurologic:  Normal speech and language. No gross focal or lateralizing neurologic deficits are appreciated.  Skin: Skin is warm, dry and intact. No rash noted. Psychiatric: Mood and affect are appropriate for situation.  EKG  Personally reviewed and interpreted by myself.   Rate: 126 Rhythm: sinus Axis: normal Intervals: WNL Sinus tachycardia Significant artifact, no obvious  acute ischemic changes No STEMI    Radiology  CXR: IMPRESSION:  Chronic emphysema, hyperinflation, left pleuroparenchymal scarring and pleural effusion. No acute abnormality.  Emphysema (ICD10-J43.9).   CT PE: IMPRESSION:  1. No pulmonary embolus.  2. Advanced emphysema with chronic left lung scarring, left  fibrothorax, and adjacent round atelectasis.  3. Unchanged bilateral pulmonary nodules, largest in the right lower  lobe measuring 11 x 7 mm. Follow-up recommended per prior  recommendations.  4. Mild T7 inferior endplate compression fracture is new from CT  last month. No acute or subacute rib fractures.    Procedures  Procedure(s) performed (including critical care):  .Critical Care Performed by: Miguel Aschoff., MD Authorized by: Miguel Aschoff., MD   Critical care provider statement:    Critical care time (minutes):  35   Critical care was necessary to treat or prevent imminent or life-threatening deterioration of the following conditions:  Respiratory failure   Critical care was time spent personally by me on the following activities:  Discussions with consultants, evaluation of patient's response to treatment, examination of patient, ordering and performing treatments and interventions, ordering and review of laboratory studies, ordering and review of radiographic studies, pulse oximetry, re-evaluation of patient's condition, obtaining history from patient or surrogate and review of old charts     Initial Impression / Assessment and Plan / MDM / ED Course  59 y.o. male who presents to the ED for shortness of breath, cough, pleuritic chest pain versus lateral chest wall pain.  Ddx: COPD exacerbation, pneumonia, PE, rib fractures, pleurisy, ACS  Will obtain labs, imaging, EKG.  DuoNebs, steroids, azithromycin given severity of his wheezing and respiratory status on arrival.  If no improvement with these may need to consider BiPAP.  Patient reports significant  provement in his breathing after nebulizer treatments.  Lung sounds much improved compared to prior.  Do not feel he requires BiPAP at this time.  VBG without significant hypercarbia.  Remainder of labs without actionable derangements, negative troponin, negative BNP.  CT negative for PE or rib fractures.  Given the severity of his initial presentation, will plan to admit for COPD exacerbation for continued treatment and nebulizer treatments.  Patient and family at bedside are agreeable.   _______________________________  As part of my medical decision making I have reviewed available labs, radiology tests, reviewed old records, obtained additional history from family.  Final Clinical Impression(s) / ED Diagnosis  Final diagnoses:  Shortness of breath  COPD exacerbation ([place])  Chest pain in adult       Note:  This document was prepared using Dragon voice recognition software and may include unintentional dictation errors.   Lilia Pro., MD 07/20/19 [phone]

## 2019-07-21 ENCOUNTER — Ambulatory Visit: Payer: Medicare Other | Admitting: Family Medicine

## 2019-07-21 ENCOUNTER — Encounter: Payer: Self-pay | Admitting: Nurse Practitioner

## 2019-07-21 ENCOUNTER — Encounter: Payer: Self-pay | Admitting: Internal Medicine

## 2019-07-21 ENCOUNTER — Inpatient Hospital Stay: Payer: Medicare Other

## 2019-07-21 DIAGNOSIS — F329 Major depressive disorder, single episode, unspecified: Secondary | ICD-10-CM | POA: Diagnosis present

## 2019-07-21 DIAGNOSIS — E872 Acidosis: Secondary | ICD-10-CM | POA: Diagnosis not present

## 2019-07-21 DIAGNOSIS — G9341 Metabolic encephalopathy: Secondary | ICD-10-CM | POA: Diagnosis present

## 2019-07-21 DIAGNOSIS — E43 Unspecified severe protein-calorie malnutrition: Secondary | ICD-10-CM | POA: Diagnosis present

## 2019-07-21 DIAGNOSIS — S22060A Wedge compression fracture of T7-T8 vertebra, initial encounter for closed fracture: Secondary | ICD-10-CM | POA: Diagnosis present

## 2019-07-21 DIAGNOSIS — J441 Chronic obstructive pulmonary disease with (acute) exacerbation: Secondary | ICD-10-CM | POA: Diagnosis not present

## 2019-07-21 DIAGNOSIS — G919 Hydrocephalus, unspecified: Secondary | ICD-10-CM | POA: Insufficient documentation

## 2019-07-21 DIAGNOSIS — R140 Abdominal distension (gaseous): Secondary | ICD-10-CM | POA: Diagnosis present

## 2019-07-21 DIAGNOSIS — J439 Emphysema, unspecified: Secondary | ICD-10-CM | POA: Diagnosis present

## 2019-07-21 DIAGNOSIS — R7303 Prediabetes: Secondary | ICD-10-CM | POA: Diagnosis present

## 2019-07-21 DIAGNOSIS — W06XXXA Fall from bed, initial encounter: Secondary | ICD-10-CM | POA: Diagnosis present

## 2019-07-21 DIAGNOSIS — R64 Cachexia: Secondary | ICD-10-CM | POA: Diagnosis present

## 2019-07-21 DIAGNOSIS — Z66 Do not resuscitate: Secondary | ICD-10-CM | POA: Diagnosis not present

## 2019-07-21 DIAGNOSIS — I959 Hypotension, unspecified: Secondary | ICD-10-CM | POA: Diagnosis present

## 2019-07-21 DIAGNOSIS — J9611 Chronic respiratory failure with hypoxia: Secondary | ICD-10-CM | POA: Diagnosis not present

## 2019-07-21 DIAGNOSIS — F17210 Nicotine dependence, cigarettes, uncomplicated: Secondary | ICD-10-CM | POA: Diagnosis present

## 2019-07-21 DIAGNOSIS — R471 Dysarthria and anarthria: Secondary | ICD-10-CM | POA: Diagnosis present

## 2019-07-21 DIAGNOSIS — J9621 Acute and chronic respiratory failure with hypoxia: Secondary | ICD-10-CM | POA: Diagnosis present

## 2019-07-21 DIAGNOSIS — R918 Other nonspecific abnormal finding of lung field: Secondary | ICD-10-CM | POA: Diagnosis not present

## 2019-07-21 DIAGNOSIS — Z20822 Contact with and (suspected) exposure to covid-19: Secondary | ICD-10-CM | POA: Diagnosis present

## 2019-07-21 DIAGNOSIS — Z982 Presence of cerebrospinal fluid drainage device: Secondary | ICD-10-CM | POA: Diagnosis not present

## 2019-07-21 DIAGNOSIS — R7989 Other specified abnormal findings of blood chemistry: Secondary | ICD-10-CM | POA: Diagnosis present

## 2019-07-21 DIAGNOSIS — Z681 Body mass index (BMI) 19 or less, adult: Secondary | ICD-10-CM | POA: Diagnosis not present

## 2019-07-21 DIAGNOSIS — G2581 Restless legs syndrome: Secondary | ICD-10-CM | POA: Diagnosis present

## 2019-07-21 DIAGNOSIS — Z8781 Personal history of (healed) traumatic fracture: Secondary | ICD-10-CM | POA: Diagnosis present

## 2019-07-21 DIAGNOSIS — M545 Low back pain: Secondary | ICD-10-CM | POA: Diagnosis present

## 2019-07-21 DIAGNOSIS — R441 Visual hallucinations: Secondary | ICD-10-CM | POA: Diagnosis present

## 2019-07-21 DIAGNOSIS — F05 Delirium due to known physiological condition: Secondary | ICD-10-CM | POA: Diagnosis present

## 2019-07-21 DIAGNOSIS — Y92013 Bedroom of single-family (private) house as the place of occurrence of the external cause: Secondary | ICD-10-CM | POA: Diagnosis not present

## 2019-07-21 DIAGNOSIS — F419 Anxiety disorder, unspecified: Secondary | ICD-10-CM | POA: Diagnosis present

## 2019-07-21 LAB — COMPREHENSIVE METABOLIC PANEL
ALT: 19 U/L (ref 0–44)
AST: 29 U/L (ref 15–41)
Albumin: 3.4 g/dL — ABNORMAL LOW (ref 3.5–5.0)
Alkaline Phosphatase: 73 U/L (ref 38–126)
Anion gap: 7 (ref 5–15)
BUN: 33 mg/dL — ABNORMAL HIGH (ref 6–20)
CO2: 29 mmol/L (ref 22–32)
Calcium: 9.4 mg/dL (ref 8.9–10.3)
Chloride: 105 mmol/L (ref 98–111)
Creatinine, Ser: 1.13 mg/dL (ref 0.61–1.24)
GFR calc Af Amer: >60 mL/min (ref >60)
GFR calc non Af Amer: >60 mL/min (ref >60)
Glucose, Bld: 122 mg/dL — ABNORMAL HIGH (ref 70–99)
Potassium: 4.1 mmol/L (ref 3.5–5.1)
Sodium: 141 mmol/L (ref 135–145)
Total Bilirubin: 0.6 mg/dL (ref 0.3–1.2)
Total Protein: 6.4 g/dL — ABNORMAL LOW (ref 6.5–8.1)

## 2019-07-21 LAB — CBC WITH DIFFERENTIAL/PLATELET
Abs Immature Granulocytes: 0.08 10*3/uL — ABNORMAL HIGH (ref 0.00–0.07)
Basophils Absolute: 0 10*3/uL (ref 0.0–0.1)
Basophils Relative: 0 %
Eosinophils Absolute: 0 10*3/uL (ref 0.0–0.5)
Eosinophils Relative: 0 %
HCT: 41.5 % (ref 39.0–52.0)
Hemoglobin: 13 g/dL (ref 13.0–17.0)
Immature Granulocytes: 1 %
Lymphocytes Relative: 4 %
Lymphs Abs: 0.5 10*3/uL — ABNORMAL LOW (ref 0.7–4.0)
MCH: 30.7 pg (ref 26.0–34.0)
MCHC: 31.3 g/dL (ref 30.0–36.0)
MCV: 97.9 fL (ref 80.0–100.0)
Monocytes Absolute: 1.1 10*3/uL — ABNORMAL HIGH (ref 0.1–1.0)
Monocytes Relative: 9 %
Neutro Abs: 10.2 10*3/uL — ABNORMAL HIGH (ref 1.7–7.7)
Neutrophils Relative %: 86 %
Platelets: 240 10*3/uL (ref 150–400)
RBC: 4.24 MIL/uL (ref 4.22–5.81)
RDW: 12.8 % (ref 11.5–15.5)
WBC: 11.8 10*3/uL — ABNORMAL HIGH (ref 4.0–10.5)
nRBC: 0 % (ref 0.0–0.2)

## 2019-07-21 LAB — GLUCOSE, CAPILLARY
Glucose-Capillary: 255 mg/dL — ABNORMAL HIGH (ref 70–99)
Glucose-Capillary: 122 mg/dL — ABNORMAL HIGH (ref 70–99)
Glucose-Capillary: 86 mg/dL (ref 70–99)

## 2019-07-21 LAB — LACTIC ACID, PLASMA
Lactic Acid, Venous: 2.6 mmol/L (ref 0.5–1.9)
Lactic Acid, Venous: 2 mmol/L (ref 0.5–1.9)

## 2019-07-21 LAB — HEMOGLOBIN A1C
Hgb A1c MFr Bld: 6.1 % — ABNORMAL HIGH (ref 4.8–5.6)
Mean Plasma Glucose: 128.37 mg/dL

## 2019-07-21 LAB — PROCALCITONIN: Procalcitonin: 0.11 ng/mL

## 2019-07-21 LAB — HIV ANTIBODY (ROUTINE TESTING W REFLEX): HIV Screen 4th Generation wRfx: NONREACTIVE

## 2019-07-21 LAB — APTT: aPTT: <24 seconds — ABNORMAL LOW (ref 24–36)

## 2019-07-21 LAB — PROTIME-INR
INR: 0.9 (ref 0.8–1.2)
Prothrombin Time: 12.3 seconds (ref 11.4–15.2)

## 2019-07-21 LAB — SARS CORONAVIRUS 2 (TAT 6-24 HRS): SARS Coronavirus 2: NEGATIVE

## 2019-07-21 MED ORDER — LORAZEPAM 0.5 MG PO TABS
0.5000 mg | ORAL_TABLET | Freq: Four times a day (QID) | ORAL | Status: DC | PRN
  Administered 2019-07-21: 0.5 mg via ORAL
  Administered 2019-07-22 (×2): 1 mg via ORAL
  Filled 2019-07-21: qty 2
  Filled 2019-07-21: qty 1
  Filled 2019-07-21: qty 2

## 2019-07-21 MED ORDER — CIPROFLOXACIN-DEXAMETHASONE 0.3-0.1 % OT SUSP
4.0000 [drp] | Freq: Two times a day (BID) | OTIC | Status: DC
  Administered 2019-07-22 (×2): 4 [drp] via OTIC
  Filled 2019-07-21: qty 7.5

## 2019-07-21 MED ORDER — SODIUM CHLORIDE 0.9 % IV BOLUS (SEPSIS)
1000.0000 mL | Freq: Once | INTRAVENOUS | Status: AC
  Administered 2019-07-21: 1000 mL via INTRAVENOUS

## 2019-07-21 MED ORDER — IPRATROPIUM BROMIDE 0.02 % IN SOLN
0.5000 mg | Freq: Four times a day (QID) | RESPIRATORY_TRACT | Status: DC
  Administered 2019-07-21 – 2019-07-24 (×10): 0.5 mg via RESPIRATORY_TRACT
  Filled 2019-07-21 (×10): qty 2.5

## 2019-07-21 MED ORDER — MOMETASONE FURO-FORMOTEROL FUM 200-5 MCG/ACT IN AERO
2.0000 | INHALATION_SPRAY | Freq: Two times a day (BID) | RESPIRATORY_TRACT | Status: DC
  Administered 2019-07-21 – 2019-07-22 (×2): 2 via RESPIRATORY_TRACT
  Filled 2019-07-21: qty 8.8

## 2019-07-21 MED ORDER — BUSPIRONE HCL 10 MG PO TABS
10.0000 mg | ORAL_TABLET | Freq: Two times a day (BID) | ORAL | Status: DC
  Administered 2019-07-21 – 2019-07-26 (×8): 10 mg via ORAL
  Filled 2019-07-21 (×15): qty 1

## 2019-07-21 MED ORDER — OXYCODONE HCL 5 MG PO TABS
5.0000 mg | ORAL_TABLET | ORAL | Status: DC | PRN
  Administered 2019-07-21 – 2019-07-22 (×8): 5 mg via ORAL
  Filled 2019-07-21 (×8): qty 1

## 2019-07-21 MED ORDER — LEVALBUTEROL HCL 1.25 MG/0.5ML IN NEBU
1.2500 mg | INHALATION_SOLUTION | Freq: Four times a day (QID) | RESPIRATORY_TRACT | Status: DC
  Administered 2019-07-22 – 2019-07-24 (×9): 1.25 mg via RESPIRATORY_TRACT
  Filled 2019-07-21 (×15): qty 0.5

## 2019-07-21 MED ORDER — ALBUTEROL SULFATE (2.5 MG/3ML) 0.083% IN NEBU
2.5000 mg | INHALATION_SOLUTION | Freq: Four times a day (QID) | RESPIRATORY_TRACT | Status: DC | PRN
  Administered 2019-07-21: 2.5 mg via RESPIRATORY_TRACT
  Filled 2019-07-21: qty 3

## 2019-07-21 MED ORDER — VANCOMYCIN HCL 1250 MG/250ML IV SOLN
1250.0000 mg | INTRAVENOUS | Status: DC
  Filled 2019-07-21: qty 250

## 2019-07-21 MED ORDER — PIPERACILLIN-TAZOBACTAM 3.375 G IVPB
3.3750 g | Freq: Three times a day (TID) | INTRAVENOUS | Status: DC
  Administered 2019-07-22 (×2): 3.375 g via INTRAVENOUS
  Filled 2019-07-21 (×2): qty 50

## 2019-07-21 MED ORDER — PANTOPRAZOLE SODIUM 40 MG PO TBEC
40.0000 mg | DELAYED_RELEASE_TABLET | Freq: Every day | ORAL | Status: DC
  Administered 2019-07-21 – 2019-08-02 (×11): 40 mg via ORAL
  Filled 2019-07-21 (×12): qty 1

## 2019-07-21 MED ORDER — INSULIN ASPART 100 UNIT/ML ~~LOC~~ SOLN
0.0000 [IU] | Freq: Three times a day (TID) | SUBCUTANEOUS | Status: DC
  Administered 2019-07-22: 1 [IU] via SUBCUTANEOUS
  Administered 2019-07-24 – 2019-07-25 (×3): 2 [IU] via SUBCUTANEOUS
  Administered 2019-07-25: 1 [IU] via SUBCUTANEOUS
  Administered 2019-07-26: 2 [IU] via SUBCUTANEOUS
  Administered 2019-07-28: 12:00:00 5 [IU] via SUBCUTANEOUS
  Administered 2019-07-28: 17:00:00 3 [IU] via SUBCUTANEOUS
  Administered 2019-07-28: 10:00:00 2 [IU] via SUBCUTANEOUS
  Filled 2019-07-21 (×9): qty 1

## 2019-07-21 MED ORDER — ADULT MULTIVITAMIN W/MINERALS CH
1.0000 | ORAL_TABLET | Freq: Every day | ORAL | Status: DC
  Administered 2019-07-22 – 2019-08-02 (×10): 1 via ORAL
  Filled 2019-07-21 (×10): qty 1

## 2019-07-21 MED ORDER — IOHEXOL 9 MG/ML PO SOLN
500.0000 mL | ORAL | Status: AC
  Administered 2019-07-21 (×2): 500 mL via ORAL

## 2019-07-21 MED ORDER — VANCOMYCIN HCL 1250 MG/250ML IV SOLN
1250.0000 mg | Freq: Once | INTRAVENOUS | Status: AC
  Administered 2019-07-21: 1250 mg via INTRAVENOUS
  Filled 2019-07-21: qty 250

## 2019-07-21 MED ORDER — ENSURE ENLIVE PO LIQD
237.0000 mL | Freq: Three times a day (TID) | ORAL | Status: DC
  Administered 2019-07-21 (×2): 237 mL via ORAL

## 2019-07-21 MED ORDER — ROPINIROLE HCL 1 MG PO TABS
1.5000 mg | ORAL_TABLET | Freq: Every day | ORAL | Status: DC
  Administered 2019-07-21: 1.5 mg via ORAL
  Filled 2019-07-21: qty 2

## 2019-07-21 MED ORDER — IOHEXOL 300 MG/ML  SOLN
75.0000 mL | Freq: Once | INTRAMUSCULAR | Status: AC | PRN
  Administered 2019-07-21: 75 mL via INTRAVENOUS

## 2019-07-21 MED ORDER — IPRATROPIUM-ALBUTEROL 0.5-2.5 (3) MG/3ML IN SOLN
3.0000 mL | Freq: Three times a day (TID) | RESPIRATORY_TRACT | Status: DC
  Administered 2019-07-21: 3 mL via RESPIRATORY_TRACT
  Filled 2019-07-21: qty 3

## 2019-07-21 MED ORDER — SERTRALINE HCL 50 MG PO TABS
150.0000 mg | ORAL_TABLET | Freq: Every day | ORAL | Status: DC
  Administered 2019-07-22 – 2019-08-02 (×11): 150 mg via ORAL
  Filled 2019-07-21 (×11): qty 3

## 2019-07-21 MED ORDER — TIOTROPIUM BROMIDE MONOHYDRATE 18 MCG IN CAPS
18.0000 ug | ORAL_CAPSULE | Freq: Every day | RESPIRATORY_TRACT | Status: DC
  Administered 2019-07-21 – 2019-08-02 (×11): 18 ug via RESPIRATORY_TRACT
  Filled 2019-07-21 (×3): qty 5

## 2019-07-21 MED ORDER — SODIUM CHLORIDE 0.9 % IV BOLUS (SEPSIS): 250.0000 mL | Freq: Once | INTRAVENOUS | Status: DC

## 2019-07-21 MED ORDER — MONTELUKAST SODIUM 10 MG PO TABS
10.0000 mg | ORAL_TABLET | Freq: Every day | ORAL | Status: DC
  Administered 2019-07-21 – 2019-07-23 (×2): 10 mg via ORAL
  Filled 2019-07-21 (×2): qty 1

## 2019-07-21 MED ORDER — SODIUM CHLORIDE 0.9 % IV BOLUS (SEPSIS): 500.0000 mL | Freq: Once | INTRAVENOUS | Status: DC

## 2019-07-21 MED ORDER — HYDROCODONE-HOMATROPINE 5-1.5 MG/5ML PO SYRP
5.0000 mL | ORAL_SOLUTION | Freq: Three times a day (TID) | ORAL | Status: DC | PRN
  Administered 2019-07-22: 5 mL via ORAL
  Filled 2019-07-21: qty 5

## 2019-07-21 MED ORDER — ROPINIROLE HCL 1 MG PO TABS
0.5000 mg | ORAL_TABLET | Freq: Every morning | ORAL | Status: DC
  Administered 2019-07-21 – 2019-07-22 (×2): 0.5 mg via ORAL
  Filled 2019-07-21 (×2): qty 1

## 2019-07-21 NOTE — Significant Event (Signed)
Rapid Response Event Note  Overview: Time Called: 1755 Arrival Time: 1800 Event Type: Respiratory, Cardiac  Initial Focused Assessment: Per bedside RN, patient had been tachycardic for most of shift, no response to interventions from provider. HR 120-130's, per patient this is his normal HR, BP stable, SpO2 99% 3.5LNC. Neurologically intact, no acute distress noted. Per patient due to his COPD he feels [facility] and chest tightness regularly and these are not increased at this time. Patient appears anxious.  Interventions: Spoke to MD Swayze on phone, reported assessment of patient, orders placed for lorazepam to help with anxiety, no orders to transfer patient at this time.  Plan of Care (if not transferred): Before leaving, HR 110-115, SpO2 remains stable, no acute distress. Orders to be placed by [facility] for heart rate parameters.  Event Summary: Name of Physician Notified: Gerri Lins, MD at 1805    at    Outcome: Stayed in room and stabalized  Event End Time: 1930  Hyman Bower

## 2019-07-21 NOTE — Evaluation (Signed)
Physical Therapy Evaluation Patient Details Name: Austin Blevins MRN: [id number] DOB: 1960-11-15 Today's Date: 07/21/2019   History of Present Illness  Austin Blevins is a 59 y.o. male with medical history significant for hydrocephalus with known VP shunt, severe COPD with chronic respiratory failure on home O2 at 3 L, anxiety and restless leg syndrome who presented to the emergency room with severe respiratory distress and complaint of left  lateral chest pain and mid to lower back pain. Pain is worse with movement and coughing and is of moderate intensity described as sharp and nonradiating. CTA of his chest which was negative for PE but showed advanced emphysema and stable pulmonary nodules also showed a "mild T7 inferior endplate compression fracture is new."    Clinical Impression  Pt alert, sitting EOB with family (sister) in room. Pt stated he just found out his ex-wife is now in the ED, attempting to call and receive and update. PLOF gathered from OT eval/patient, pt stated previously he had been independent at baseline, lives with his mother.  Pt sitting EOB through majority of session, exhibited tremors that patient reported is baseline. HR and spO2 monitored continuously, on 3L via [place]. HR in 110s with conversation, up to 130s with standing/exertion. SpO2 88-89% occasionally, pt more limited by SOB.  Pt also informed PT that he had ambulated to the bathroom earlier in the day, and it really "wiped him out" and that he struggled to catch his breath. Declined transferring to recliner, but performed sit <> stand with supervision (and use of bed rail). Pt able to march in place ~15 seconds but quickly became fatigued and increasingly SOB, further mobility deferred.  Overall the patient demonstrated deficits (see "PT Problem List") that impede the patient's functional abilities, safety, and mobility and would benefit from skilled PT intervention. Recommendation is SNF due to acute decline in functional  status.     Follow Up Recommendations SNF    Equipment Recommendations  Other (comment)(TBD)    Recommendations for Other Services       Precautions / Restrictions Precautions Precautions: Other (comment)(HOB >30deg) Restrictions Weight Bearing Restrictions: No      Mobility  Bed Mobility Overal bed mobility: Modified Independent                Transfers Overall transfer level: Needs assistance Equipment used: None Transfers: Sit to/from Stand Sit to Stand: Supervision         General transfer comment: pt utilized bed rails, reached for UE suport  Ambulation/Gait             General Gait Details: deferred. Pt anxious, at EOB, HR in 120-130s on exertion/standing. Pt able to march in place, quickly became SOB  Stairs            Wheelchair Mobility    Modified Rankin (Stroke Patients Only)       Balance Overall balance assessment: Needs assistance Sitting-balance support: Feet supported Sitting balance-Leahy Scale: Good                                       Pertinent Vitals/Pain Pain Assessment: No/denies pain Pain Intervention(s): Monitored during session    Home Living Family/patient expects to be discharged to:: Private residence Living Arrangements: Parent(46 yo mother) Available Help at Discharge: Family;Friend(s);Available PRN/intermittently;Other (Comment)(81 yo mother is home 24/7 but unable to assist, siblings PRN) Type of Home: House Home Access:  Stairs to enter Entrance Stairs-Rails: Right;Left(Cannot reach both, uses R) Technical brewer of Steps: 3 Home Layout: One level Home Equipment: Shower seat;Grab bars - tub/shower;Hand held shower head;Hospital bed Additional Comments: Per OT note/pt report    Prior Function Level of Independence: Needs assistance   Gait / Transfers Assistance Needed: Pt reports driving and not using any AD but "not enough energy to go driving or out of the house" lately. Pt  is on 3L [place] at baseline. Requires increasing help from his sister for groceries and iADLs.   ADL's / Homemaking Assistance Needed: Pt reports sitting to bathe and increased rest breaks for ADLs. BUE tremor negatively impacted ability to self-feed (uses straws for liquids, embarrassed eating in public, drops food from utensils). Pt currentyl sleeps in hospital bed c rails up to prevent falling out of bed - states 5 falls in last 6 months have all been falling out of bed.         Hand Dominance   Dominant Hand: Right    Extremity/Trunk Assessment   Upper Extremity Assessment Upper Extremity Assessment: Generalized weakness;Defer to OT evaluation    Lower Extremity Assessment Lower Extremity Assessment: LLE deficits/detail;RLE deficits/detail RLE Deficits / Details: grossly 4/5 LLE Deficits / Details: grossly 4/5    Cervical / Trunk Assessment Cervical / Trunk Assessment: Normal  Communication   Communication: No difficulties  Cognition Arousal/Alertness: Awake/alert Behavior During Therapy: WFL for tasks assessed/performed Overall Cognitive Status: Within Functional Limits for tasks assessed                                        General Comments General comments (skin integrity, edema, etc.): Pt tremuluous throughout session. SpO2 monitored on 3L, 88-93%, HR in 110s-130s.    Exercises Other Exercises Other Exercises: Pt and family educated on PT role. POC, discharge recommendations, importance of continued mobility and PLB. Other Exercises: Pt performed standing marching ~15secs, became very SOB. Pt also changed positions several times during session with supervision.   Assessment/Plan    PT Assessment Patient needs continued PT services  PT Problem List Decreased strength;Decreased range of motion;Decreased knowledge of use of DME;Decreased activity tolerance;Decreased balance;Cardiopulmonary status limiting activity;Decreased mobility       PT  Treatment Interventions Balance training;DME instruction;Gait training;Neuromuscular re-education;Stair training;Functional mobility training;Patient/family education;Therapeutic activities;Therapeutic exercise;Manual techniques    PT Goals (Current goals can be found in the Care Plan section)  Acute Rehab PT Goals Patient Stated Goal: To return to PLOF PT Goal Formulation: With patient Time For Goal Achievement: 08/04/19 Potential to Achieve Goals: Good    Frequency Min 2X/week   Barriers to discharge        Co-evaluation               AM-PAC PT "6 Clicks" Mobility  Outcome Measure Help needed turning from your back to your side while in a flat bed without using bedrails?: None Help needed moving from lying on your back to sitting on the side of a flat bed without using bedrails?: None Help needed moving to and from a bed to a chair (including a wheelchair)?: A Little Help needed standing up from a chair using your arms (e.g., wheelchair or bedside chair)?: A Little Help needed to walk in hospital room?: A Lot Help needed climbing 3-5 steps with a railing? : A Lot 6 Click Score: 18    End of Session  Activity Tolerance: Patient limited by fatigue Patient left: in bed;with family/visitor present;with call bell/phone within reach;with bed alarm set Nurse Communication: Mobility status PT Visit Diagnosis: Other abnormalities of gait and mobility (R26.89);Muscle weakness (generalized) (M62.81);Other symptoms and signs involving the nervous system (N82.956)    Time: 2130-8657 PT Time Calculation (min) (ACUTE ONLY): 19 min   Charges:   PT Evaluation $PT Eval Moderate Complexity: 1 Mod PT Treatments $Therapeutic Exercise: 8-22 mins        Olga Coaster PT, DPT 4:04 PM,07/21/19

## 2019-07-21 NOTE — Progress Notes (Addendum)
Initial Nutrition Assessment  DOCUMENTATION CODES:   Severe malnutrition in context of chronic illness  INTERVENTION:   Ensure Enlive po BID, each supplement provides 350 kcal and 20 grams of protein  Magic cup TID with meals, each supplement provides 290 kcal and 9 grams of protein  MVI daily   Dysphagia 3 diet   NUTRITION DIAGNOSIS:   Severe Malnutrition related to chronic illness(COPD) as evidenced by moderate to severe fat depletions, moderate to severe muscle depletions.  GOAL:   Patient will meet greater than or equal to 90% of their needs  MONITOR:   PO intake, Supplement acceptance, Labs, Weight trends, Skin, I & O's  REASON FOR ASSESSMENT:   Consult Assessment of nutrition requirement/status  ASSESSMENT:   59 y.o. male with history of hydrocephalus status post VP shunt, severe COPD on baseline 3 L nasal cannula, chronic pancreatitis, restless leg who presents to the emergency department for shortness of breath, cough, bilateral posteriolateral mid thoracic chest and back pain.  RD familiar with this patient from previous admits. Pt with poor appetite and oral intake at baseline; pt also has poor dentition. Pt reports that he only eats one meal per day at home. Pt usually eats a sandwich, cereal or soup. Pt has been compliant with Ensure supplements in the past but has also reported before that he does not like Ensure. Per chart, pt documented to have eaten 100% of his breakfast this morning. RD will add supplements and vitamins to help pt meet his estimated needs. Pt is followed by palliative care at home.   Per chart, pt is weight stable pta   Medications reviewed and include: lovenox, prednisone  Labs reviewed: BUN 29(H) Lipase 108(H)  NUTRITION - FOCUSED PHYSICAL EXAM:    Most Recent Value  Orbital Region  Severe depletion  Upper Arm Region  Moderate depletion  Thoracic and Lumbar Region  Moderate depletion  Buccal Region  Severe depletion  Temple  Region  Severe depletion  Clavicle Bone Region  Severe depletion  Clavicle and Acromion Bone Region  Severe depletion  Scapular Bone Region  Severe depletion  Dorsal Hand  Severe depletion  Patellar Region  Severe depletion  Anterior Thigh Region  Severe depletion  Posterior Calf Region  Severe depletion  Edema (RD Assessment)  None  Hair  Reviewed  Eyes  Reviewed  Mouth  Reviewed  Skin  Reviewed  Nails  Reviewed     Diet Order:   Diet Order            DIET DYS 3 Room service appropriate? Yes; Fluid consistency: Thin  Diet effective now             EDUCATION NEEDS:   Education needs have been addressed  Skin:  Skin Assessment: Reviewed RN Assessment(ecchymosis)  Last BM:  3/3  Height:   Ht Readings from Last 1 Encounters:  07/20/19 5\' 7"  (1.702 m)    Weight:   Wt Readings from Last 1 Encounters:  07/20/19 55.3 kg    Ideal Body Weight:  67 kg  BMI:  Body mass index is 19.11 kg/m.  Estimated Nutritional Needs:   Kcal:  1700-2000kcal/day  Protein:  85-100g/day  Fluid:  >1.7L/day  09/19/19 MS, RD, LDN Contact information available in Amion

## 2019-07-21 NOTE — Significant Event (Signed)
Rapid Response Event Note  Overview: Time Called: 1902 Arrival Time: 1907 Event Type: Respiratory, Cardiac  Initial Focused Assessment: Second rapid response paged for this patient. Bedside RN reports patient ambulated himself to the restroom without having his oxygen on. Became tachypneic, hypoxic and tachycardic. SpO2 on 8L [place] 95%, HR 130-140's, NST, RR 25-20. Diaphoretic, alert and oriented X4. Denies chest pain.  Appears anxious.  Interventions: Provided comfort to help calm patient down. Webb Silversmith, NP called to bedside.  Plan of Care (if not transferred): NP to review chart and assess need for further orders. Before leaving room, HR down 110's, SpO2 95% 3L [place], RRT 15. Patient states he was starting to feel better and calming down. Per NP, no need for transfer at this time, will continue to monitor on floor.  Event Summary: Name of Physician Notified: Webb Silversmith, NP at 1912    at    Outcome: Stayed in room and stabalized  Event End Time: 1930  Hyman Bower

## 2019-07-21 NOTE — Progress Notes (Signed)
Patient is currently followed by [facility] community palliative program at home. TOC Charlynn Court aware. Thank you. Dayna Barker BSN, RN, [facility] [employer] [phone]

## 2019-07-21 NOTE — Progress Notes (Signed)
PT Cancellation Note  Patient Details Name: Austin Blevins MRN: [id number] DOB: [birth year]   Cancelled Treatment:    Reason Eval/Treat Not Completed: Other (comment) Pt with RN, RN student and respiratory at bedside, currently receiving breathing treatment. PT follow up as able.  Olga Coaster PT, DPT 11:42 AM,07/21/19

## 2019-07-21 NOTE — Progress Notes (Signed)
   07/21/19 1615  Clinical Encounter Type  Visited With Patient and family together  Visit Type Initial  Referral From Nurse  Consult/Referral To Chaplain  Chaplain visited with patient after Nurse Evette's request. She received notification that the patient's wife was in ED. Chaplain entered the room and patient mentioned his breathing and said my wife is in ED with the same problem. Patient went on to say Zella Ball is his ex wife. Chaplain offered pastoral presence, empathy, and prayer.

## 2019-07-21 NOTE — Evaluation (Signed)
Occupational Therapy Evaluation Patient Details Name: Austin Blevins MRN: [id number] DOB: 08/01/1960 Today's Date: 07/21/2019    History of Present Illness Austin Blevins is a 59 y.o. male with medical history significant for hydrocephalus with known VP shunt, severe COPD with chronic respiratory failure on home O2 at 3 L, anxiety and restless leg syndrome who presented to the emergency room with severe respiratory distress and complaint of left  lateral chest pain and mid to lower back pain. Pain is worse with movement and coughing and is of moderate intensity described as sharp and nonradiating. CTA of his chest which was negative for PE but showed advanced emphysema and stable pulmonary nodules also showed a "mild T7 inferior endplate compression fracture is new."   Clinical Impression   Austin Blevins was seen for OT evaluation this date. Pt was modified independent in all ADL and functional mobility, requiring increased time for self-feeding and sitting to bathe, pt required assistance from siblings for iADLs. Pt was living in a one story home with his 21 yo mother who is HoH. Pt on 3 liters of O2 at home. Pt reports becoming easily fatigued or out of breath with minimal exertion. Pt currently requires Setup and Supervision c MIN VCs for rest breaks for seated ADLs due to current functional impairments (See OT Problem List below). Pt unable to complete standing ADLs this date 2/2 SpO2 88% on 2L [place] after ~2 min standing trial. Pt educated in energy conservation strategies including pursed lip breathing, activity pacing, home/routines modifications, work simplification, AE/DME, prioritizing of meaningful occupations, and falls prevention. Pt verbalized understanding and would benefit from additional skilled OT services to maximize recall and carryover of learned techniques and facilitate implementation of learned techniques into daily routines. Upon discharge, recommend HHOT services c 24 hour supervision.      Follow Up Recommendations  Home health OT;Supervision/Assistance - 24 hour    Equipment Recommendations  3 in 1 bedside commode    Recommendations for Other Services       Precautions / Restrictions Precautions Precautions: Other (comment)(HOB >30*) Restrictions Weight Bearing Restrictions: No      Mobility Bed Mobility Overal bed mobility: Modified Independent             General bed mobility comments: Pt benefitted from use of R handrail for scooting laterally in bed. HOB @45 * sup<>sit.   Transfers Overall transfer level: Needs assistance Equipment used: Rolling walker (2 wheeled) Transfers: Sit to/from Stand Sit to Stand: Min guard         General transfer comment: +1 for lines management and MIN VCs for RW technique.     Balance Overall balance assessment: Needs assistance Sitting-balance support: Feet supported Sitting balance-Leahy Scale: Good     Standing balance support: Bilateral upper extremity supported Standing balance-Leahy Scale: Good Standing balance comment: Limited 2/2 SOB and BUE tremor                           ADL either performed or assessed with clinical judgement   ADL Overall ADL's : Needs assistance/impaired                                       General ADL Comments: Requires SETUP for self-drinking/feeding - significant BUE tremors. MOD I don/doff B socks seated cross-legged in bed - MIN VCs for PLB.  Vision Baseline Vision/History: Wears glasses Wears Glasses: Reading only       Perception     Praxis      Pertinent Vitals/Pain Pain Assessment: 0-10 Pain Score: 9  Pain Location: L chest/flank and abdomen Pain Descriptors / Indicators: Constant;Discomfort;Grimacing Pain Intervention(s): Limited activity within patient's tolerance;Monitored during session;RN gave pain meds during session;Utilized relaxation techniques;Repositioned     Hand Dominance Right   Extremity/Trunk Assessment  Upper Extremity Assessment Upper Extremity Assessment: RUE deficits/detail;LUE deficits/detail RUE Deficits / Details: BUE AROM WFL however pt reports abdominal pain (not quantified) with holding arms above ~100*. BUE intention tremor present. B grip 5/5.    Lower Extremity Assessment Lower Extremity Assessment: Defer to PT evaluation;Overall WFL for tasks assessed       Communication Communication Communication: No difficulties   Cognition Arousal/Alertness: Awake/alert Behavior During Therapy: WFL for tasks assessed/performed Overall Cognitive Status: Within Functional Limits for tasks assessed                                     General Comments  Reclined in bed at [facility] of session: SpO2 95% on 2L [place], HR 117. Seated in bed c increased c/o pain: 92% on 2L [place], HR 122. Standing EOB: SpO2 88% on 2L [place]. Reclined in bed at End of session: SpO2 97% on 2L [place], HR 112    Exercises Exercises: Other exercises Other Exercises Other Exercises: Pt educated re: energy conservation strategies, home/routines modifications, AE/DME recommendations, PLB for pain and COPD management, importance of OOB mobility for improved functional strengthening/endurance, safe RW technique Other Exercises: Self-feeding/drinking, PLB, don/doff socks, sitting/standing balance/tolerance, simulated UBD, bed mobility, sup<>sit, sit<>stand   Shoulder Instructions      Home Living Family/patient expects to be discharged to:: Private residence Living Arrangements: Parent(81 yo mother) Available Help at Discharge: Family;Friend(s);Available PRN/intermittently;Other (Comment)(81 yo mother is home 24/7 but unable to assist, siblings PRN) Type of Home: House Home Access: Stairs to enter [employer] of Steps: 3 Entrance Stairs-Rails: Right;Left(Cannot reach both, uses R) Home Layout: One level     Bathroom Shower/Tub: Chief Strategy Officer: Standard     Home Equipment: Shower  seat;Grab bars - tub/shower;Hand held shower head;Hospital bed          Prior Functioning/Environment Level of Independence: Needs assistance  Gait / Transfers Assistance Needed: Pt reports driving and not using any AD but "not enough energy to go driving or out of the house" lately. Pt is on 3L [place] at baseline. Requires increasing help from his sister for groceries and iADLs.  ADL's / Homemaking Assistance Needed: Pt reports sitting to bathe and increased rest breaks for ADLs. BUE tremor negatively impacted ability to self-feed (uses straws for liquids, embarrassed eating in public, drops food from utensils). Pt currentyl sleeps in hospital bed c rails up to prevent falling out of bed - states 5 falls in last 6 months have all been falling out of bed.             OT Problem List: Decreased range of motion;Decreased activity tolerance;Decreased coordination;Decreased knowledge of use of DME or AE;Impaired UE functional use      OT Treatment/Interventions: Self-care/ADL training;Therapeutic exercise;Neuromuscular education;Energy conservation;DME and/or AE instruction;Therapeutic activities;Patient/family education    OT Goals(Current goals can be found in the care plan section) Acute Rehab OT Goals Patient Stated Goal: To return to PLOF OT Goal Formulation: With patient Time For  Goal Achievement: 08/04/19 Potential to Achieve Goals: Good ADL Goals Pt Will Perform Eating: with modified independence(c AE PRN) Pt Will Perform Grooming: with modified independence(c LRAD PRN and no cues for seated rest breaks) Additional ADL Goal #1: Pt will verbalize plan to implement x3 strategies to modify home routines for improved energy conservation  OT Frequency: Min 1X/week   Barriers to D/C: Decreased caregiver support          Co-evaluation              AM-PAC OT "6 Clicks" Daily Activity     Outcome Measure Help from another person eating meals?: A Little Help from another person  taking care of personal grooming?: A Little Help from another person toileting, which includes using toliet, bedpan, or urinal?: A Little Help from another person bathing (including washing, rinsing, drying)?: A Little Help from another person to put on and taking off regular upper body clothing?: A Little Help from another person to put on and taking off regular lower body clothing?: A Little 6 Click Score: 18   End of Session Equipment Utilized During Treatment: Rolling walker  Activity Tolerance: Patient tolerated treatment well;Patient limited by fatigue Patient left: in bed;with call bell/phone within reach  OT Visit Diagnosis: Repeated falls (R29.6);Other abnormalities of gait and mobility (R26.89);Feeding difficulties (R63.3)                Time: 4656-8127 OT Time Calculation (min): 50 min Charges:  OT General Charges $OT Visit: 1 Visit OT Evaluation $OT Eval Moderate Complexity: 1 Mod OT Treatments $Self Care/Home Management : 38-52 mins  Dessie Coma, M.S. OTR/L  07/21/19, 10:36 AM

## 2019-07-21 NOTE — Progress Notes (Signed)
RN walked into room at 1800 to find patient short of breath. Respiration rate was 32 per minute, heart rate sustained in the 120s, and oxygen saturation 98% on 3 liters. Rapid response was called. MD was made aware and ativan was ordered. Lactic acid result was 2.6 and MD was notified.  At 1900, a second rapid response was called when RN walked into room to find patient on toilet struggling to breathe. Patient had removed nasal cannula to go to the bathroom. spO2 was 74% on room air and heart rate 141 bpm. Patient was placed back on oxygen and stabilized. Oxygen was 98% on 3 liters at conclusion of rapid response.  MD, NP, and charge nurse aware. Notified oncoming night shift nurse of the event.  07/21/2019 7:52 PM  Leonides Cave, RN

## 2019-07-21 NOTE — Progress Notes (Addendum)
PROGRESS NOTE  KAREEM CATHEY EBR:[id number] DOB: [birth year] DOA: 07/20/2019 PCP: [name], NP  Brief History    ADEOLUWA SILVERS is a 59 y.o. male with medical history significant for hydrocephalus with known VP shunt, severe COPD with chronic respiratory failure on home O2 at 3 L, anxiety and restless leg syndrome who presented to the emergency room with severe respiratory distress and complaint of left  lateral chest pain and mid to lower back pain. Symptoms have been ongoing for 4 days. Pain is worse with movement and coughing and is of moderate intensity described as sharp and nonradiating. He has no associated nausea vomiting or diaphoresis. Shortness of breath worsens the chest pain.  He denies a fall. He denies fever, chills, abdominal pain, nausea vomiting or change in bowel movement.  He saw his pulmonologist a few days prior for follow-up and received a prescription for steroid taper pack and tramadol for the back pain. ED Course: On arrival to the emergency room, per ED provider he was initially very tachypneic at 30, using accessory muscles to breathe. O2 sat maintained at 90% on his home flow rate at 3 L. He was also tachycardic at 134 but afebrile. BP 172/86. On his blood work, BNP 44 troponin less than 2, white cell count 12,500 with otherwise unremarkable blood work. EKG showed no acute ST-T wave changes. He had a CTA of his chest which was negative for PE but showed advanced emphysema and stable pulmonary nodules also showed a new T7 fracture. Patient was given DuoNeb treatments in the emergency room as well as IV steroids with significant improvement in his breathing. Hospitalist consulted for admission  Consultants   None  Procedures   None  Antibiotics   Anti-infectives (From admission, onward)   Start     Dose/Rate Route Frequency Ordered Stop   07/20/19 1830  azithromycin (ZITHROMAX) 500 mg in sodium chloride 0.9 % 250 mL IVPB     500 mg 250 mL/hr over 60 Minutes  Intravenous  Once 07/20/19 1824 07/20/19 1956      Subjective  The patient is sitting up in bed. He is tremulous. He states that this is chronic and is so bad that the only food he can eat are sandwiches. He complains of pain with cough or movement of his torso. He states that he has a coughing jag with any activity resulting in severe pain.  Objective   Vitals:  Vitals:   07/21/19 1145 07/21/19 1157  BP: (!) 142/83 (!) 141/81  Pulse: (!) 120 (!) 117  Resp: (!) 32   Temp: 97.6 F (36.4 C) 98.4 F (36.9 C)  SpO2: 99% 98%   Exam:  Constitutional:   The patient is awake, alert, and oriented x 3. Mild distress from pain, and cough with conversation. Respiratory:   No increased work of breathing.  Positive for rhonchi throughout as well as scattered wheezes.  No rales.  No tactile fremitus Cardiovascular:   Rate is fast, but regular.  No murmurs, ectopy, or gallups.  No lateral PMI. No thrills. Abdomen:   Abdomen is soft, non-tender, non-distended  Scaffoid  No hernias, masses, or organomegaly  Normoactive bowel sounds.  Musculoskeletal:   No cyanosis, clubbing, or edema  Cachectic Skin:   No rashes, lesions, ulcers  palpation of skin: no induration or nodules Neurologic:   CN 2-12 intact  Sensation all 4 extremities intact  Severe tremors/fasciculations all over Psychiatric:   Mental status o Mood, affect appropriate o Orientation  to person, place, time   judgment and insight appear intact  I have personally reviewed the following:   Today's Data   Vitals, CBC  Imaging   CXR  CTA chest  EKG: Sinus tachycardia  Scheduled Meds:  busPIRone  10 mg Oral BID   enoxaparin (LOVENOX) injection  40 mg Subcutaneous Q24H   feeding supplement (ENSURE ENLIVE)  237 mL Oral TID BM   ipratropium-albuterol  3 mL Nebulization TID   methylPREDNISolone (SOLU-MEDROL) injection  60 mg Intravenous Q12H   Followed by   Melene Muller ON 07/22/2019]  predniSONE  40 mg Oral Q breakfast   mometasone-formoterol  2 puff Inhalation BID   montelukast  10 mg Oral QHS   [START ON 07/22/2019] multivitamin with minerals  1 tablet Oral Daily   pantoprazole  40 mg Oral Daily   rOPINIRole  0.5 mg Oral q AM   rOPINIRole  1.5 mg Oral QHS   [START ON 07/22/2019] sertraline  150 mg Oral Daily   tiotropium  18 mcg Inhalation Daily   Continuous Infusions:  Principal Problem:   COPD with acute exacerbation (HCC) Active Problems:   Chronic respiratory failure with hypoxia (HCC)   Pulmonary nodules   Chest pain at rest   Thoracic vertebral fracture (HCC)   LOS: 0 days    A & P   Acute on chronic respiratory failure with hypoxia (HCC): The patient uses O2 at 3L by [place] at home chronically. He is currently saturating at 98% on 3L. However, he develops respiratory distress with coughing that occurs with any activity.    COPD with acute exacerbation: The patients CXR and CT document his severe COPD and emphysema. At home his pulmonologist had treated the patient with a steroid taper and pain meds. As inpatient the patient is being treated with IV steroids, azithromycin, mucolytics, and nebulizer treatment. He is currently saturating in the high 90's on his home O2 requirements.  Acute Bronchitis: Resulting in COPD exacerbation. Steroids, nebs, mucolytics and azithromycin.  Pulmonary nodules: CTA chest demonstrated unchanged bilateral pulmonary nodules with the largest in the right lower lobe. Follow is recommended as per previous recommendations per radiology.  Chest pain: Primarily occurs with activity, but especially with cough. Pain is minimal while at rest. EKG demonstrated no ischemic changes, and troponins were negative. The patient's pleuritic chest pain is likely due to chest wall soreness from cough, as well as pain from acute thoracic vertebral fracture. Pt denies recent fall or injury. He also has pain with activity probably due to the  vertebral body fracture. He is receiving hycodan to suppress his cough as well as as needed oxycodone for pain. Will add toradol.   Acute T7 vertebral body fracture: Most certainly largely to blame for the patient's discomfort, but also possibly a consequence of the patient's cough due to acute bronchitis and his chronic on and off steroid use for severe COPD. Doubt that this fracture and the patient's osteopenic spine would be appropriate for kyphoplasty. Will add toradol to pain control and consult PT/OT for mobility.  Chronic tremors: Likely due to the patient's history of hydrocephalus. Chronic. Will consult neurology to see if there is anything that can be done to improve his ability to feed himself without spillage.  Anxiety: Continue BuSpar  Hydrocephalus with history of VP shunt: Likely cause of the patient's chronic and severe tremors. No acute concerns at this time related to the hydrocephalus itself.  I have seen and examined this patient myself. I have spent  35 minutes in his evaluation and care.  DVT prophylaxis: Lovenox  Code Status: full code  Family Communication:  none  Disposition Plan: Patient is from home. Anticipated discharge would be back to previous home environment when cough, pain and tremor are under control.  Gabryelle Whitmoyer, DO [facility] Direct contact: see [email or web address]  7PM-7AM contact night coverage as above 07/21/2019, 1:55 PM  LOS: 0 days   ADDENDUM: This afternoon the patient has had worsening tachycardia, and tachypnea with somewhat increased shortness of breath. KUB performed due to abdominal distension demonstrated no obstruction, but did she an abnormal appearance of the gastric shadow that was possibly an artifact and possibly pneumoperitoneum. CT abdomen was ordered and is pending. Lactic acid was also ordered and is positive at 2.6. The patient's hypoxia has also worsened. Repeat CXR has also been ordered.

## 2019-07-21 NOTE — Progress Notes (Signed)
Pharmacy Antibiotic Note  Austin Blevins is a 59 y.o. male admitted on 07/20/2019 with sepsis.  Pharmacy has been consulted for Vancomycin and Zosyn dosing.  Plan: Zosyn 3.375g IV q8h (4 hour infusion).   Vancomycin 1250 mg IV Q 24 hrs. Goal AUC 400-550. Expected AUC: 520 SCr used: 1.13 Expected Cmin: 10.5   Height: 5\' 7"  (170.2 cm) Weight: 122 lb (55.3 kg) IBW/kg (Calculated) : 66.1  Temp (24hrs), Avg:97.8 F (36.6 C), Min:97.3 F (36.3 C), Max:98.4 F (36.9 C)  Recent Labs  Lab 07/20/19 1650 07/20/19 1922 07/21/19 1815 07/21/19 1935 07/21/19 2050  WBC 12.5* 9.8  --  11.8*  --   CREATININE 1.20  --   --  1.13  --   LATICACIDVEN  --   --  2.6*  --  2.0*    Estimated Creatinine Clearance: 56.4 mL/min (by C-G formula based on SCr of 1.13 mg/dL).    Allergies  Allergen Reactions  . Acetaminophen Other (See Comments)    Syncope  . Percocet [Oxycodone-Acetaminophen]     Antimicrobials this admission: Vancomycin 3/5 >>  Zosyn 3/5 >>   Dose adjustments this admission:  Microbiology results:  Thank you for allowing pharmacy to be a part of this patient's care.  09/20/19, PharmD, BCPS 07/21/2019 9:21 PM

## 2019-07-22 ENCOUNTER — Inpatient Hospital Stay: Payer: Medicare Other

## 2019-07-22 DIAGNOSIS — J441 Chronic obstructive pulmonary disease with (acute) exacerbation: Secondary | ICD-10-CM

## 2019-07-22 LAB — BLOOD GAS, ARTERIAL
Acid-Base Excess: 8.2 mmol/L — ABNORMAL HIGH (ref 0.0–2.0)
Acid-base deficit: 10.7 mmol/L — ABNORMAL HIGH (ref 0.0–2.0)
Bicarbonate: 35.5 mmol/L — ABNORMAL HIGH (ref 20.0–28.0)
Bicarbonate: 20 mmol/L (ref 20.0–28.0)
FIO2: 0.36
FIO2: 1
O2 Saturation: 94.4 %
O2 Saturation: 99.8 %
Patient temperature: 37
Patient temperature: 37
pCO2 arterial: 60 mmHg — ABNORMAL HIGH (ref 32.0–48.0)
pCO2 arterial: 66 mmHg (ref 32.0–48.0)
pH, Arterial: 7.38 (ref 7.350–7.450)
pH, Arterial: 7.09 — CL (ref 7.350–7.450)
pO2, Arterial: 74 mmHg — ABNORMAL LOW (ref 83.0–108.0)
pO2, Arterial: 264 mmHg — ABNORMAL HIGH (ref 83.0–108.0)

## 2019-07-22 LAB — URINALYSIS, COMPLETE (UACMP) WITH MICROSCOPIC
Bacteria, UA: NONE SEEN
Bilirubin Urine: NEGATIVE
Glucose, UA: NEGATIVE mg/dL
Hgb urine dipstick: NEGATIVE
Ketones, ur: NEGATIVE mg/dL
Leukocytes,Ua: NEGATIVE
Nitrite: NEGATIVE
Protein, ur: NEGATIVE mg/dL
Specific Gravity, Urine: 1.028 (ref 1.005–1.030)
Squamous Epithelial / HPF: NONE SEEN (ref 0–5)
WBC, UA: NONE SEEN WBC/hpf (ref 0–5)
pH: 5 (ref 5.0–8.0)

## 2019-07-22 LAB — GLUCOSE, CAPILLARY
Glucose-Capillary: 66 mg/dL — ABNORMAL LOW (ref 70–99)
Glucose-Capillary: 78 mg/dL (ref 70–99)
Glucose-Capillary: 118 mg/dL — ABNORMAL HIGH (ref 70–99)
Glucose-Capillary: 134 mg/dL — ABNORMAL HIGH (ref 70–99)
Glucose-Capillary: 75 mg/dL (ref 70–99)
Glucose-Capillary: 80 mg/dL (ref 70–99)

## 2019-07-22 LAB — COMPREHENSIVE METABOLIC PANEL
ALT: 19 U/L (ref 0–44)
ALT: 21 U/L (ref 0–44)
AST: 16 U/L (ref 15–41)
AST: 19 U/L (ref 15–41)
Albumin: 3.1 g/dL — ABNORMAL LOW (ref 3.5–5.0)
Albumin: 3.5 g/dL (ref 3.5–5.0)
Alkaline Phosphatase: 62 U/L (ref 38–126)
Alkaline Phosphatase: 69 U/L (ref 38–126)
Anion gap: 8 (ref 5–15)
Anion gap: 6 (ref 5–15)
BUN: 26 mg/dL — ABNORMAL HIGH (ref 6–20)
BUN: 26 mg/dL — ABNORMAL HIGH (ref 6–20)
CO2: 32 mmol/L (ref 22–32)
CO2: 32 mmol/L (ref 22–32)
Calcium: 8.8 mg/dL — ABNORMAL LOW (ref 8.9–10.3)
Calcium: 8.9 mg/dL (ref 8.9–10.3)
Chloride: 102 mmol/L (ref 98–111)
Chloride: 101 mmol/L (ref 98–111)
Creatinine, Ser: 1.03 mg/dL (ref 0.61–1.24)
Creatinine, Ser: 1.05 mg/dL (ref 0.61–1.24)
GFR calc Af Amer: >60 mL/min (ref >60)
GFR calc Af Amer: >60 mL/min (ref >60)
GFR calc non Af Amer: >60 mL/min (ref >60)
GFR calc non Af Amer: >60 mL/min (ref >60)
Glucose, Bld: 89 mg/dL (ref 70–99)
Glucose, Bld: 71 mg/dL (ref 70–99)
Potassium: 3.9 mmol/L (ref 3.5–5.1)
Potassium: 4.9 mmol/L (ref 3.5–5.1)
Sodium: 142 mmol/L (ref 135–145)
Sodium: 139 mmol/L (ref 135–145)
Total Bilirubin: 0.7 mg/dL (ref 0.3–1.2)
Total Bilirubin: 0.4 mg/dL (ref 0.3–1.2)
Total Protein: 5.7 g/dL — ABNORMAL LOW (ref 6.5–8.1)
Total Protein: 6.4 g/dL — ABNORMAL LOW (ref 6.5–8.1)

## 2019-07-22 LAB — CBC
HCT: 38.9 % — ABNORMAL LOW (ref 39.0–52.0)
HCT: 41 % (ref 39.0–52.0)
Hemoglobin: 12.1 g/dL — ABNORMAL LOW (ref 13.0–17.0)
Hemoglobin: 13 g/dL (ref 13.0–17.0)
MCH: 31.1 pg (ref 26.0–34.0)
MCH: 31.3 pg (ref 26.0–34.0)
MCHC: 31.1 g/dL (ref 30.0–36.0)
MCHC: 31.7 g/dL (ref 30.0–36.0)
MCV: 100 fL (ref 80.0–100.0)
MCV: 98.6 fL (ref 80.0–100.0)
Platelets: 194 K/uL (ref 150–400)
Platelets: 198 10*3/uL (ref 150–400)
RBC: 3.89 MIL/uL — ABNORMAL LOW (ref 4.22–5.81)
RBC: 4.16 MIL/uL — ABNORMAL LOW (ref 4.22–5.81)
RDW: 12.9 % (ref 11.5–15.5)
RDW: 12.5 % (ref 11.5–15.5)
WBC: 10.2 K/uL (ref 4.0–10.5)
WBC: 7.9 10*3/uL (ref 4.0–10.5)
nRBC: 0 % (ref 0.0–0.2)
nRBC: 0 % (ref 0.0–0.2)

## 2019-07-22 LAB — URINE DRUG SCREEN, QUALITATIVE (ARMC ONLY)
Amphetamines, Ur Screen: NOT DETECTED
Barbiturates, Ur Screen: NOT DETECTED
Benzodiazepine, Ur Scrn: POSITIVE — AB
Cannabinoid 50 Ng, Ur ~~LOC~~: NOT DETECTED
Cocaine Metabolite,Ur ~~LOC~~: NOT DETECTED
MDMA (Ecstasy)Ur Screen: NOT DETECTED
Methadone Scn, Ur: NOT DETECTED
Opiate, Ur Screen: POSITIVE — AB
Phencyclidine (PCP) Ur S: NOT DETECTED
Tricyclic, Ur Screen: NOT DETECTED

## 2019-07-22 LAB — MRSA PCR SCREENING: MRSA by PCR: NEGATIVE

## 2019-07-22 LAB — LACTIC ACID, PLASMA
Lactic Acid, Venous: 3.8 mmol/L (ref 0.5–1.9)
Lactic Acid, Venous: 1.5 mmol/L (ref 0.5–1.9)

## 2019-07-22 LAB — TROPONIN I (HIGH SENSITIVITY)
Troponin I (High Sensitivity): 6 ng/L (ref <18)
Troponin I (High Sensitivity): 7 ng/L (ref <18)

## 2019-07-22 MED ORDER — QUETIAPINE FUMARATE 25 MG PO TABS
50.0000 mg | ORAL_TABLET | Freq: Every day | ORAL | Status: DC
  Administered 2019-07-22 – 2019-07-26 (×5): 50 mg via ORAL
  Filled 2019-07-22 (×5): qty 2

## 2019-07-22 MED ORDER — FENTANYL CITRATE (PF) 100 MCG/2ML IJ SOLN
25.0000 ug | Freq: Once | INTRAMUSCULAR | Status: AC
  Administered 2019-07-22: 25 ug via INTRAVENOUS

## 2019-07-22 MED ORDER — DIAZEPAM 5 MG/ML IJ SOLN
INTRAMUSCULAR | Status: AC
  Administered 2019-07-22: 2.5 mg via INTRAVENOUS
  Filled 2019-07-22: qty 2

## 2019-07-22 MED ORDER — FENTANYL CITRATE (PF) 100 MCG/2ML IJ SOLN
25.0000 ug | INTRAMUSCULAR | Status: DC | PRN
  Administered 2019-07-22: 25 ug via INTRAVENOUS
  Administered 2019-07-23 – 2019-07-29 (×12): 50 ug via INTRAVENOUS
  Administered 2019-07-29: 01:00:00 25 ug via INTRAVENOUS
  Filled 2019-07-22 (×13): qty 2

## 2019-07-22 MED ORDER — ETOMIDATE 2 MG/ML IV SOLN
INTRAVENOUS | Status: AC
  Filled 2019-07-22: qty 10

## 2019-07-22 MED ORDER — PREDNISONE 10 MG PO TABS
10.0000 mg | ORAL_TABLET | Freq: Every day | ORAL | Status: DC
  Administered 2019-07-24 – 2019-07-26 (×3): 10 mg via ORAL
  Filled 2019-07-22 (×3): qty 1

## 2019-07-22 MED ORDER — DEXMEDETOMIDINE HCL IN NACL 400 MCG/100ML IV SOLN
0.4000 ug/kg/h | INTRAVENOUS | Status: DC
  Administered 2019-07-23 (×2): 1.2 ug/kg/h via INTRAVENOUS
  Filled 2019-07-22 (×4): qty 100

## 2019-07-22 MED ORDER — DIAZEPAM 5 MG/ML IJ SOLN
2.5000 mg | INTRAMUSCULAR | Status: DC | PRN
  Administered 2019-07-23 – 2019-07-24 (×4): 5 mg via INTRAVENOUS
  Filled 2019-07-22 (×4): qty 2

## 2019-07-22 MED ORDER — ROPINIROLE HCL 1 MG PO TABS
0.5000 mg | ORAL_TABLET | Freq: Once | ORAL | Status: AC
  Administered 2019-07-22: 0.5 mg via ORAL
  Filled 2019-07-22: qty 1

## 2019-07-22 MED ORDER — QUETIAPINE FUMARATE 25 MG PO TABS
25.0000 mg | ORAL_TABLET | Freq: Every day | ORAL | Status: DC
  Administered 2019-07-23 – 2019-07-25 (×3): 25 mg via ORAL
  Filled 2019-07-22 (×3): qty 1

## 2019-07-22 MED ORDER — SODIUM BICARBONATE 8.4 % IV SOLN
100.0000 meq | Freq: Once | INTRAVENOUS | Status: AC
  Administered 2019-07-22: 100 meq via INTRAVENOUS
  Filled 2019-07-22: qty 100

## 2019-07-22 MED ORDER — SODIUM CHLORIDE 0.9 % IV BOLUS
500.0000 mL | Freq: Once | INTRAVENOUS | Status: AC
  Administered 2019-07-22: 500 mL via INTRAVENOUS

## 2019-07-22 MED ORDER — SODIUM CHLORIDE 0.9 % IV SOLN: INTRAVENOUS | Status: DC

## 2019-07-22 MED ORDER — HALOPERIDOL LACTATE 5 MG/ML IJ SOLN
2.0000 mg | Freq: Four times a day (QID) | INTRAMUSCULAR | Status: DC | PRN
  Administered 2019-07-27: 2 mg via INTRAVENOUS
  Filled 2019-07-22 (×3): qty 1

## 2019-07-22 MED ORDER — FENTANYL CITRATE (PF) 100 MCG/2ML IJ SOLN
INTRAMUSCULAR | Status: AC
  Filled 2019-07-22: qty 2

## 2019-07-22 MED ORDER — ROPINIROLE HCL 1 MG PO TABS
1.0000 mg | ORAL_TABLET | Freq: Every morning | ORAL | Status: DC
  Administered 2019-07-23 – 2019-08-02 (×11): 1 mg via ORAL
  Filled 2019-07-22 (×13): qty 1

## 2019-07-22 MED ORDER — DEXTROSE 5 % IV SOLN: INTRAVENOUS | Status: DC

## 2019-07-22 MED ORDER — SODIUM CHLORIDE 0.9 % IV SOLN: Freq: Once | INTRAVENOUS | Status: AC

## 2019-07-22 MED ORDER — ROPINIROLE HCL 1 MG PO TABS
2.0000 mg | ORAL_TABLET | Freq: Every day | ORAL | Status: DC
  Administered 2019-07-23 – 2019-07-26 (×4): 2 mg via ORAL
  Filled 2019-07-22 (×8): qty 2

## 2019-07-22 MED ORDER — DIAZEPAM 5 MG/ML IJ SOLN
2.5000 mg | INTRAMUSCULAR | Status: DC | PRN
  Administered 2019-07-22: 2.5 mg via INTRAVENOUS

## 2019-07-22 MED ORDER — BUDESONIDE 0.5 MG/2ML IN SUSP
0.5000 mg | Freq: Two times a day (BID) | RESPIRATORY_TRACT | Status: DC
  Administered 2019-07-22 – 2019-08-02 (×21): 0.5 mg via RESPIRATORY_TRACT
  Filled 2019-07-22 (×22): qty 2

## 2019-07-22 MED ORDER — ROCURONIUM BROMIDE 50 MG/5ML IV SOLN
INTRAVENOUS | Status: AC
  Filled 2019-07-22: qty 1

## 2019-07-22 MED ORDER — DIAZEPAM 5 MG/ML IJ SOLN: 2.5000 mg | Freq: Once | INTRAMUSCULAR | Status: AC

## 2019-07-22 MED ORDER — CHLORHEXIDINE GLUCONATE CLOTH 2 % EX PADS
6.0000 | MEDICATED_PAD | Freq: Every day | CUTANEOUS | Status: DC
  Administered 2019-07-23: 6 via TOPICAL

## 2019-07-22 MED ORDER — FENTANYL CITRATE (PF) 100 MCG/2ML IJ SOLN
12.5000 ug | INTRAMUSCULAR | Status: DC | PRN
  Filled 2019-07-22: qty 2

## 2019-07-22 NOTE — Progress Notes (Addendum)
Patient has been getting more confused, restless, and impulsive as the day has progressed. The patient's sister Austin Blevins had informed me that she was concerned because her brother is more confused, restless, legs and body jerking, impulsive since being admitted at the hospital and worse today. He is pulling on his lines, removed his oxygen, removing his telemetry and oxygen sensor.  CT was ordered this am. Respirations 32, heart rate 109, lactic acid 3.8. Dr Evlyn Kanner informed about all of this. She has spoke with the sister. She has increased the requip and added seroquel. Telemonitor ordered for the patient.

## 2019-07-22 NOTE — Consult Note (Signed)
Name: Austin Blevins MRN: [id number] DOB: 11/20/1960     CONSULTATION DATE: 07/20/2019  REFERRING MD : TRH  CHIEF COMPLAINT: acute mental status changes    HISTORY OF PRESENT ILLNESS:   59 y.o. male with medical history significant for hydrocephalus with known VP shunt, severe COPD with chronic respiratory failure on home O2 at 3 L, anxiety and restless leg syndrome who presented to the emergency room with severe respiratory distress and suspicion of sepsis on Vanc and Zosyn.  As per sister he has significant tremor bilateral upper and less so lower extremities. Today patient has worsening tremor and dysarthria.     Patient was transferred to ICU for acute mental status changes 7.38/60/74 LA elevated 3.8 Vs stable  NEURO HAS BEEN CONSULTED CT earlier today No acute findings   Patient alert and awake, follows commands He si UNABLE TO SPEAK, mumbling and garbled speech.   PAST MEDICAL HISTORY :   has a past medical history of Anxiety, COPD (chronic obstructive pulmonary disease) ([place]), Depression, and Hydrocephalus ([place]).  has a past surgical history that includes ventricular shunt and Brain surgery (2003). Prior to Admission medications   Medication Sig Start Date End Date Taking? Authorizing Provider  albuterol (PROVENTIL) (2.5 MG/3ML) 0.083% nebulizer solution USE ONE VIAL IN NEBULIZER EVERY SIX HOURS AS NEEDED FOR WHEEZING OR SHORTNESS OF BREATH 02/08/19   [name], Jolene T, NP  albuterol (VENTOLIN HFA) 108 (90 Base) MCG/ACT inhaler INHALE 2 PUFFS INTO THE LUNGS EVERY 4 HOURS AS NEEDED FOR WHEEZING OR SHORTNESS OF BREATH 05/25/19   [name], Jolene T, NP  busPIRone (BUSPAR) 10 MG tablet TAKE ONE TABLET TWICE DAILY 06/22/19   [name], Jolene T, NP  Fluticasone-Salmeterol (ADVAIR DISKUS) 250-50 MCG/DOSE AEPB Inhale 1 puff into the lungs 2 (two) times daily. 10/03/18   [name], Henrine Screws T, NP  HYDROcodone-homatropine (HYCODAN) 5-1.5 MG/5ML syrup Take 5 mLs by mouth every 8 (eight)  hours as needed. 06/22/19   [provider]  montelukast (SINGULAIR) 10 MG tablet Take 1 tablet (10 mg total) by mouth at bedtime. 04/28/19   [name], Henrine Screws T, NP  omeprazole (PRILOSEC) 20 MG capsule TAKE 1 CAPSULE BY MOUTH EVERY DAY 02/20/19   [name], Jolene T, NP  predniSONE (DELTASONE) 10 MG tablet Take 10 mg by mouth daily with breakfast.    [provider]  rOPINIRole (REQUIP) 0.5 MG tablet TAKE ONE TABLET EVERY MORNING AND TAKE THREE TABLETS AT BEDTIME AS NEEDED FOR RESTLESS LEG SYNDROME 05/25/19   [name], Henrine Screws T, NP  sertraline (ZOLOFT) 100 MG tablet Take 1.5 tablets (150 mg total) by mouth daily. 07/03/19   [name], Jolene T, NP  SPIRIVA HANDIHALER 18 MCG inhalation capsule INHALE CONTENTS OF 1 CAPSULE AS DIRECTEDEVERY DAY WITH HANDIHALER 03/23/19   [name], Jolene T, NP  albuterol (PROVENTIL HFA;VENTOLIN HFA) 108 (90 Base) MCG/ACT inhaler Inhale 2 puffs into the lungs every 4 (four) hours as needed for wheezing or shortness of breath. 08/02/18   [name], Henrine Screws T, NP  albuterol (VENTOLIN HFA) 108 (90 Base) MCG/ACT inhaler INHALE 2 PUFFS INTO THE LUNGS EVERY 4 HOURS AS NEEDED FOR WHEEZING OR SHORTNESS OF BREATH 02/23/19   [name], Jolene T, NP  rOPINIRole (REQUIP) 0.5 MG tablet TAKE ONE TABLET BY MOUTH EVERY MORNING AND 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 04/24/19   Marnee Guarneri T, NP   Allergies  Allergen Reactions  . Acetaminophen Other (See Comments)    Syncope  . Percocet [Oxycodone-Acetaminophen]  FAMILY HISTORY:  family history includes Anxiety disorder in his sister; Arthritis/Rheumatoid in his brother and sister; CAD in his father; Depression in his sister; Diabetes in his sister, sister, and sister; Diabetes Mellitus II in his mother; Heart disease in his brother; Hyperlipidemia in his sister; Hypertension in his father and sister; Other in his brother and brother. SOCIAL HISTORY:  reports that he has been smoking cigarettes. He has a 40.00  pack-year smoking history. He has never used smokeless tobacco. He reports that he does not drink alcohol or use drugs.  REVIEW OF SYSTEMS:   UNABLE TO OBTAIN ROS DUE TO GARBLED SPEECH  VITAL SIGNS: Temp:  [97.8 F (36.6 C)-98.3 F (36.8 C)] 98.3 F (36.8 C) (03/06 1156) Pulse Rate:  [77-130] 113 (03/06 1647) Resp:  [20-32] 32 (03/06 1647) BP: (106-157)/(81-93) 157/85 (03/06 1647) SpO2:  [85 %-100 %] 96 % (03/06 1647)   I/O last 3 completed shifts: In: 764.1 [P.O.:240; I.V.:474.1; IV Piggyback:50] Out: 650 [Urine:650] Total I/O In: 120 [P.O.:120] Out: 300 [Urine:300]   SpO2: 96 % O2 Flow Rate (L/min): 4 L/min   Physical Examination:  GENERAL:critically ill appearing, HEAD: Normocephalic, atraumatic.  EYES: Pupils equal, round, reactive to light.  No scleral icterus.  MOUTH: Moist mucosal membrane. NECK: Supple. No JVD.  PULMONARY: CTA B/L CARDIOVASCULAR: S1 and S2. Regular rate and rhythm. No murmurs, rubs, or gallops.  GASTROINTESTINAL: Soft, nontender, -distended.  Positive bowel sounds.  MUSCULOSKELETAL: No swelling, clubbing, or edema.  NEUROLOGIC: follows commands, garbled speech SKIN:intact,warm,dry    MEDICATIONS: I have reviewed all medications and confirmed regimen as documented   CULTURE RESULTS   Recent Results (from the past 240 hour(s))  SARS CORONAVIRUS 2 (TAT 6-24 HRS) Nasopharyngeal Nasopharyngeal Swab     Status: None   Collection Time: 07/20/19  8:09 PM   Specimen: Nasopharyngeal Swab  Result Value Ref Range Status   SARS Coronavirus 2 NEGATIVE NEGATIVE Final    Comment: (NOTE) SARS-CoV-2 target nucleic acids are NOT DETECTED. The SARS-CoV-2 RNA is generally detectable in upper and lower respiratory specimens during the acute phase of infection. Negative results do not preclude SARS-CoV-2 infection, do not rule out co-infections with other pathogens, and should not be used as the sole basis for treatment or other patient management  decisions. Negative results must be combined with clinical observations, patient history, and epidemiological information. The expected result is Negative. Fact Sheet for Patients: HairSlick.no Fact Sheet for Healthcare Providers: quierodirigir.com This test is not yet approved or cleared by the [place] FDA and  has been authorized for detection and/or diagnosis of SARS-CoV-2 by FDA under an Emergency Use Authorization (EUA). This EUA will remain  in effect (meaning this test can be used) for the duration of the COVID-19 declaration under Section 56 4(b)(1) of the Act, 21 U.S.C. section 360bbb-3(b)(1), unless the authorization is terminated or revoked sooner. Performed at [facility], [address] [place], [place] [address]   Culture, blood (x 2)     Status: None (Preliminary result)   Collection Time: 07/21/19  7:37 PM   Specimen: BLOOD  Result Value Ref Range Status   Specimen Description BLOOD LEFT ANTECUBITAL  Final   Special Requests   Final    BOTTLES DRAWN AEROBIC AND ANAEROBIC Blood Culture adequate volume   Culture   Final    NO GROWTH < 12 HOURS Performed at [facility], [address] [place], [place] [address]    Report Status PENDING  Incomplete  Culture, blood (x 2)     Status: None (Preliminary result)   Collection Time: 07/21/19  7:38 PM   Specimen: BLOOD  Result Value Ref Range Status   Specimen Description BLOOD BLOOD LEFT HAND  Final   Special Requests   Final    BOTTLES DRAWN AEROBIC AND ANAEROBIC Blood Culture adequate volume   Culture   Final    NO GROWTH < 12 HOURS Performed at [facility], [address] [place], [place] [address]    Report Status PENDING  Incomplete  MRSA PCR Screening     Status: None   Collection Time: 07/22/19 12:07 PM   Specimen: Nasal Mucosa; Nasopharyngeal  Result Value Ref Range Status   MRSA by PCR NEGATIVE NEGATIVE Final     Comment:        The GeneXpert MRSA Assay (FDA approved for NASAL specimens only), is one component of a comprehensive MRSA colonization surveillance program. It is not intended to diagnose MRSA infection nor to guide or monitor treatment for MRSA infections. Performed at [facility], [address] [place], [place] [address]           IMAGING    CT HEAD WO CONTRAST  Result Date: 07/22/2019 CLINICAL DATA:  59 year old with an indwelling ventriculoperitoneal shunt, presenting with speech difficulty. EXAM: CT HEAD WITHOUT CONTRAST TECHNIQUE: Contiguous axial images were obtained from the base of the skull through the vertex without intravenous contrast. COMPARISON:  11/12/2011. FINDINGS: Patient motion blurred many of the images, and persistent motion was present on the repeat imaging. Therefore, the study is less than optimal. Brain: Ventriculoperitoneal shunt catheter INNER is from a RIGHT parieto-occipital approach and has its tip in the frontal horn of the LEFT LATERAL ventricle. Mild lateral and third ventriculomegaly, unchanged since the 2013 examination. Moderate changes of small vessel disease of the white matter diffusely, progressive since prior CT. Allowing for the motion degradation, no visible hemorrhage or hematoma, extra-axial fluid collections, or evidence of acute stroke. Vascular: Mild-to-moderate age advanced BILATERAL carotid siphon atherosclerosis. No visible hyperdense vessel. Skull: No skull fracture or other focal osseous abnormality involving the skull. Sinuses/Orbits: Visualized paranasal sinuses, bilateral mastoid air cells and bilateral middle ear cavities well-aerated. Visualized orbits and globes normal in appearance. Other: None. IMPRESSION: 1. Motion degraded examination demonstrates no acute intracranial abnormality. 2. Stable mild lateral and third ventriculomegaly with ventriculoperitoneal shunt catheter in place. 3. Moderate chronic microvascular  ischemic changes of the white matter. Electronically Signed   By: Hulan Saas M.D.   On: 07/22/2019 13:28   CT ABDOMEN PELVIS W CONTRAST  Result Date: 07/21/2019 CLINICAL DATA:  59 year old male with abdominal distension. Concern for small bowel obstruction. EXAM: CT ABDOMEN AND PELVIS WITH CONTRAST TECHNIQUE: Multidetector CT imaging of the abdomen and pelvis was performed using the standard protocol following bolus administration of intravenous contrast. CONTRAST:  51mL OMNIPAQUE IOHEXOL 300 MG/ML  SOLN COMPARISON:  CT of the chest abdomen pelvis dated 03/30/2014. Abdominal radiograph dated 07/21/2019. FINDINGS: Lower chest: There is emphysematous changes of the lungs. Partially visualized chronic loculated bilateral pleural effusions, left greater right. There is a focal area of atelectasis versus scarring in the adjacent left lung base. No intra-abdominal free air or free fluid. Hepatobiliary: The liver is grossly unremarkable. No intrahepatic biliary ductal dilatation. No calcified gallstone or pericholecystic fluid. Pancreas: Scattered small pancreatic calcification sequela of chronic pancreatitis. No active inflammatory changes. No dilatation of the main pancreatic duct. Spleen: Normal in size without focal abnormality. Adrenals/Urinary  Tract: The adrenal glands are unremarkable. There is no hydronephrosis on either side. There is symmetric enhancement and excretion of contrast by both kidneys. The visualized ureters and urinary bladder appear unremarkable. Stomach/Bowel: There is moderate stool throughout the colon. There is no bowel obstruction or active inflammation. The appendix is not visualized with certainty. No inflammatory changes identified in the right lower quadrant. Vascular/Lymphatic: Mild aortoiliac atherosclerotic disease. The IVC is unremarkable. No portal venous gas. There is no adenopathy. Reproductive: The prostate and seminal vesicles are grossly unremarkable. No pelvic mass.  Other: None Musculoskeletal: Osteopenia with degenerative changes of the spine. No acute osseous pathology. IMPRESSION: 1. No acute intra-abdominal or pelvic pathology. No bowel obstruction. 2. Chronic loculated bilateral pleural effusions, left greater right. 3. Aortic Atherosclerosis (ICD10-I70.0). Electronically Signed   By: Elgie Collard M.D.   On: 07/21/2019 23:19   DG Chest Port 1 View  Result Date: 07/21/2019 CLINICAL DATA:  59 year old male with shortness of breath. History of COPD. EXAM: PORTABLE CHEST 1 VIEW COMPARISON:  Chest radiograph dated 07/20/2019 and CT dated 07/20/2019 FINDINGS: Small left pleural effusion and left lung base atelectasis similar to prior radiograph. Pneumonia is not excluded clinical correlation is recommended. Similar appearance of the left lateral mid lung field pleuroparenchymal scarring. There is background of emphysema. No pneumothorax. Stable cardiomediastinal silhouette. No acute osseous pathology. IMPRESSION: 1. Small left pleural effusion and left lung base atelectasis versus infiltrate. No interval change since the prior radiograph. 2. Emphysema. Electronically Signed   By: Elgie Collard M.D.   On: 07/21/2019 19:36        ASSESSMENT AND PLAN SYNOPSIS   Initially admitted for SEVERE COPD EXACERBATION -continue IV steroids as prescribed -continue NEB THERAPY as prescribed Continue IV abx   NEUROLOGY Acute mental status changes Ph WNL for patient-chronic resp acidosis LA is 3.8   CARDIAC ICU monitoring  ID -continue IV abx as prescibed -follow up cultures  GI GI PROPHYLAXIS as indicated  NUTRITIONAL STATUS DIET-->NPO Constipation protocol as indicated   ENDO - will use ICU hypoglycemic\Hyperglycemia protocol if needed   ELECTROLYTES -follow labs as needed -replace as needed -pharmacy consultation and following   DVT/GI PRX ordered TRANSFUSIONS AS NEEDED MONITOR FSBS ASSESS the need for LABS    Will follow along  in Consultation  Lucie Leather, M.D.  [facility]  Medical Director [facility] Medical Director [facility] Cardio-Pulmonary Department

## 2019-07-22 NOTE — Progress Notes (Signed)
[place] out of patients nose, repositioned [place], running at 3lpm.

## 2019-07-22 NOTE — Progress Notes (Signed)
PROGRESS NOTE  Austin Blevins MEQ:[id number] DOB: 19-Dec-1960 DOA: 07/20/2019 PCP: [name], NP  Brief History    Austin Blevins is a 59 y.o. male with medical history significant for hydrocephalus with known VP shunt, severe COPD with chronic respiratory failure on home O2 at 3 L, anxiety and restless leg syndrome who presented to the emergency room with severe respiratory distress and complaint of left  lateral chest pain and mid to lower back pain. Symptoms have been ongoing for 4 days. Pain is worse with movement and coughing and is of moderate intensity described as sharp and nonradiating. He has no associated nausea vomiting or diaphoresis. Shortness of breath worsens the chest pain.  He denies a fall. He denies fever, chills, abdominal pain, nausea vomiting or change in bowel movement.  He saw his pulmonologist a few days prior for follow-up and received a prescription for steroid taper pack and tramadol for the back pain. ED Course: On arrival to the emergency room, per ED provider he was initially very tachypneic at 30, using accessory muscles to breathe. O2 sat maintained at 90% on his home flow rate at 3 L. He was also tachycardic at 134 but afebrile. BP 172/86. On his blood work, BNP 44 troponin less than 2, white cell count 12,500 with otherwise unremarkable blood work. EKG showed no acute ST-T wave changes. He had a CTA of his chest which was negative for PE but showed advanced emphysema and stable pulmonary nodules also showed a new T7 fracture. Patient was given DuoNeb treatments in the emergency room as well as IV steroids with significant improvement in his breathing. Hospitalist consulted for admission.  The patient has been admitted to a telemetry bed. He is receiving steroids, antibiotics and nebulizer treatments. He is also receiving pain control. Sinus tachycardia has been addressed with ativan.   On 07/21/2019 the patient had increased heart rate and increased oxygen  requirements. CXR demonstrated a small left pleural effusion and emphysema. No change from previous. EKG demonstrated sinus tachycardia which was addressed with ativan.   On 07/22/2019 his wife expressed to nursing that the patient's tremors were worse than usual and that his speech was less intelligible than normal. CT head was performed and was unchanged from previous. Neurology was consulted. They felt that the patient was delerious and recommended antipsychotic for sleep.  Consultants  . Neurology  Procedures  . None  Antibiotics   Anti-infectives (From admission, onward)   Start     Dose/Rate Route Frequency Ordered Stop   07/22/19 2200  vancomycin (VANCOREADY) IVPB 1250 mg/250 mL     1,250 mg 166.7 mL/hr over 90 Minutes Intravenous Every 24 hours 07/21/19 2119     07/21/19 2200  piperacillin-tazobactam (ZOSYN) IVPB 3.375 g     3.375 g 12.5 mL/hr over 240 Minutes Intravenous Every 8 hours 07/21/19 2117     07/21/19 2130  vancomycin (VANCOREADY) IVPB 1250 mg/250 mL     1,250 mg 166.7 mL/hr over 90 Minutes Intravenous  Once 07/21/19 2117 07/22/19 0120   07/20/19 1830  azithromycin (ZITHROMAX) 500 mg in sodium chloride 0.9 % 250 mL IVPB     500 mg 250 mL/hr over 60 Minutes Intravenous  Once 07/20/19 1824 07/20/19 1956     Subjective  The patient is sitting up in bed. He is more tremulous than yesterday. No new complaints.  Objective   Vitals:  Vitals:   07/22/19 1156 07/22/19 1348  BP: 132/81   Pulse: 77  Resp: 20   Temp: 98.3 F (36.8 C)   SpO2: 99% 99%   Exam:  Constitutional:  The patient is awake, alert, and oriented x 3. Speech seems a little thick. Respiratory:  . No increased work of breathing. [name] Kitchen Positive for rhonchi throughout as well as scattered wheezes. . No rales. . No tactile fremitus Cardiovascular:  . Rate is fast, but regular. . No murmurs, ectopy, or gallups. . No lateral PMI. No thrills. Abdomen:  . Abdomen is soft, non-tender,  non-distended . Scaffoid . No hernias, masses, or organomegaly . Normoactive bowel sounds.  Musculoskeletal:  . No cyanosis, clubbing, or edema . Cachectic Skin:  . No rashes, lesions, ulcers . palpation of skin: no induration or nodules Neurologic:  . CN 2-12 intact . Sensation all 4 extremities intact . Severe tremors/fasciculations all over Psychiatric:  . Mental status o Mood, affect appropriate o Orientation to person, place, time  . judgment and insight appear intact  I have personally reviewed the following:   Today's Data  . Vitals, CBC  Imaging  . CXR . CTA chest  EKG: Sinus tachycardia  Scheduled Meds: . busPIRone  10 mg Oral BID  . ciprofloxacin-dexamethasone  4 drop Both EARS BID  . enoxaparin (LOVENOX) injection  40 mg Subcutaneous Q24H  . feeding supplement (ENSURE ENLIVE)  237 mL Oral TID BM  . insulin aspart  0-9 Units Subcutaneous TID WC  . ipratropium  0.5 mg Nebulization Q6H  . levalbuterol  1.25 mg Nebulization Q6H  . mometasone-formoterol  2 puff Inhalation BID  . montelukast  10 mg Oral QHS  . multivitamin with minerals  1 tablet Oral Daily  . pantoprazole  40 mg Oral Daily  . predniSONE  40 mg Oral Q breakfast  . QUEtiapine  25 mg Oral QHS  . rOPINIRole  0.5 mg Oral Once  . [START ON 07/23/2019] rOPINIRole  1 mg Oral q AM  . rOPINIRole  2 mg Oral QHS  . sertraline  150 mg Oral Daily  . tiotropium  18 mcg Inhalation Daily   Continuous Infusions: . piperacillin-tazobactam (ZOSYN)  IV 3.375 g (07/22/19 0830)  . sodium chloride     And  . sodium chloride    . vancomycin      Principal Problem:   COPD with acute exacerbation (HCC) Active Problems:   Chronic respiratory failure with hypoxia (HCC)   Pulmonary nodules   Chest pain at rest   Thoracic vertebral fracture (HCC)   COPD exacerbation (HCC)   LOS: 1 day    A & P   Acute on chronic respiratory failure with hypoxia (HCC): The patient uses O2 at 3L by [place] at home chronically.  He is currently saturating at 98% on 3L. However, he develops respiratory distress with coughing that occurs with any activity.    COPD with acute exacerbation: The patients CXR and CT document his severe COPD and emphysema. At home his pulmonologist had treated the patient with a steroid taper and pain meds. As inpatient the patient is being treated with IV steroids, azithromycin, mucolytics, and nebulizer treatment. He is currently saturating in the high 90's on his home O2 requirements.  Acute Bronchitis: Resulting in COPD exacerbation. Steroids, nebs, mucolytics and azithromycin.  Delerium: Will start bedtime seroquel as per neurology. CT head unchanged from previous.  Pulmonary nodules: CTA chest demonstrated unchanged bilateral pulmonary nodules with the largest in the right lower lobe. Follow is recommended as per previous recommendations per radiology.  Abdominal distention:  Abdominal film raised a question of perigastric pneumoperitoniem. CT abdomen was performed which showed no obstruction, moderate stool load throughout the colonic confines, and no pneumoperitoneum.   Chest pain: Primarily occurs with activity, but especially with cough. Pain is minimal while at rest. EKG demonstrated no ischemic changes, and troponins were negative. The patient's pleuritic chest pain is likely due to chest wall soreness from cough, as well as pain from acute thoracic vertebral fracture. Pt denies recent fall or injury. He also has pain with activity probably due to the vertebral body fracture. He is receiving hycodan to suppress his cough as well as as needed oxycodone for pain.   Acute T7 vertebral body fracture: Most certainly largely to blame for the patient's discomfort, but also possibly a consequence of the patient's cough due to acute bronchitis and his chronic on and off steroid use for severe COPD. Doubt that this fracture and the patient's osteopenic spine would be appropriate for kyphoplasty.  Will add toradol to pain control and consult PT/OT for mobility.  Chronic tremors: Likely due to the patient's history of hydrocephalus. Chronic. Worse today. Neurology does not see anything new in CT head.   Anxiety: Continue BuSpar  Hydrocephalus with history of VP shunt: Likely cause of the patient's chronic and severe tremors. No acute concerns at this time related to the hydrocephalus itself.  I have seen and examined this patient myself. I have spent 32 minutes in his evaluation and care.  DVT prophylaxis: Lovenox  Code Status: full code  Family Communication:  none  Disposition Plan: Patient is from home. Anticipated discharge would be back to previous home environment when cough, pain and tremor are under control.  Austin Shadowens, DO [facility] Direct contact: see [email or web address]  7PM-7AM contact night coverage as above 07/22/2019, 4:30 PM  LOS: 0 days

## 2019-07-22 NOTE — Consult Note (Signed)
Neurology:   59 y.o. male with medical history significant for hydrocephalus with known VP shunt, severe COPD with chronic respiratory failure on home O2 at 3 L, anxiety and restless leg syndrome who presented to the emergency room with severe respiratory distress and suspicion of sepsis on Vanc and Zosyn.  As per sister he has significant tremor bilateral upper and less so lower extremities. Today patient has worsening tremor and dysarthria.    A/P - CTH reviewed and I don't see any acute abnormalities - Pt appears to be delirious/agitated and hallucinating as he is picking at objects.  - as per sister he has not slept while in the hospital - would consider atypical antipsychotic at night such as seroquel

## 2019-07-22 NOTE — Significant Event (Signed)
Pt with severe delirium/agitiation attempting to get out of bed with intermittent tremors.  However, he later developed cyanosis concerning he would not be able to protect his airway and require mechanical intubation.  ABG revealed metabolic and acute on chronic hypercapnic respiratory acidosis while NRB in place.  Therefore, ordered 2 amps of sodium bicarb, placed pt on 2L O2 via nasal canula, and precedex gtt initiated. After reviewing pts chart he is followed by outpatient palliative care and his code status is listed as DNR according to their progress notes, although here in the hospital he is listed as a Full Code.  I spoke with his sister Normand Sloop pts POA, she stated she would not want to prolong pts suffering and would like to change his code status to DNR.  I also updated his sister Mikey Bussing via telephone to discuss decline in pts condition and informed her Mrs. Izora Ribas would like his code status changed to DNR, she agreed with change in code status as well.  Therefore, code status changed to DO NOT RESUSCITATE.  Pt still having intermittent agitation/tremors despite precedex gtt, will order 2.5 mg iv valium x1 dose.  Will repeat ABG at 2330 to assess acidosis.   Sonda Rumble, AGNP  Pulmonary/Critical Care Pager [phone] (please enter 7 digits) PCCM Consult Pager [phone] (please enter 7 digits)

## 2019-07-22 NOTE — Progress Notes (Signed)
PT Cancellation Note  Patient Details Name: Austin Blevins MRN: [id number] DOB: August 31, 1960   Cancelled Treatment:    Reason Eval/Treat Not Completed: Medical issues which prohibited therapy(Per chart review, patient transferred to CCU due to decline in medical status.  Per guidelines, will require new orders to resume PT services.  Please re-consult as medically appropriate.)   Daylyn Christine H. Manson Passey, PT, DPT, NCS 07/22/19, 10:34 PM [phone]

## 2019-07-22 NOTE — Progress Notes (Signed)
Ladona Ridgel RN from ICU came to check on the patient. She spoke with Dr Gerri Lins and the patient will be transferred to step down

## 2019-07-23 LAB — TRIGLYCERIDES: Triglycerides: 47 mg/dL (ref <150)

## 2019-07-23 LAB — GLUCOSE, CAPILLARY
Glucose-Capillary: 119 mg/dL — ABNORMAL HIGH (ref 70–99)
Glucose-Capillary: 119 mg/dL — ABNORMAL HIGH (ref 70–99)
Glucose-Capillary: 92 mg/dL (ref 70–99)
Glucose-Capillary: 86 mg/dL (ref 70–99)
Glucose-Capillary: 85 mg/dL (ref 70–99)
Glucose-Capillary: 66 mg/dL — ABNORMAL LOW (ref 70–99)
Glucose-Capillary: 84 mg/dL (ref 70–99)

## 2019-07-23 LAB — BASIC METABOLIC PANEL
Anion gap: 9 (ref 5–15)
BUN: 22 mg/dL — ABNORMAL HIGH (ref 6–20)
CO2: 33 mmol/L — ABNORMAL HIGH (ref 22–32)
Calcium: 8.4 mg/dL — ABNORMAL LOW (ref 8.9–10.3)
Chloride: 97 mmol/L — ABNORMAL LOW (ref 98–111)
Creatinine, Ser: 0.92 mg/dL (ref 0.61–1.24)
GFR calc Af Amer: >60 mL/min (ref >60)
GFR calc non Af Amer: >60 mL/min (ref >60)
Glucose, Bld: 122 mg/dL — ABNORMAL HIGH (ref 70–99)
Potassium: 4.2 mmol/L (ref 3.5–5.1)
Sodium: 139 mmol/L (ref 135–145)

## 2019-07-23 LAB — CBC
HCT: 37.3 % — ABNORMAL LOW (ref 39.0–52.0)
Hemoglobin: 12.1 g/dL — ABNORMAL LOW (ref 13.0–17.0)
MCH: 31.3 pg (ref 26.0–34.0)
MCHC: 32.4 g/dL (ref 30.0–36.0)
MCV: 96.4 fL (ref 80.0–100.0)
Platelets: 171 10*3/uL (ref 150–400)
RBC: 3.87 MIL/uL — ABNORMAL LOW (ref 4.22–5.81)
RDW: 12.4 % (ref 11.5–15.5)
WBC: 8.3 10*3/uL (ref 4.0–10.5)
nRBC: 0 % (ref 0.0–0.2)

## 2019-07-23 LAB — BLOOD GAS, ARTERIAL
Acid-Base Excess: 9.6 mmol/L — ABNORMAL HIGH (ref 0.0–2.0)
Bicarbonate: 36.9 mmol/L — ABNORMAL HIGH (ref 20.0–28.0)
FIO2: 32
O2 Saturation: 98.8 %
Patient temperature: 37
pCO2 arterial: 61 mmHg — ABNORMAL HIGH (ref 32.0–48.0)
pH, Arterial: 7.39 (ref 7.350–7.450)
pO2, Arterial: 125 mmHg — ABNORMAL HIGH (ref 83.0–108.0)

## 2019-07-23 LAB — URINE CULTURE: Culture: NO GROWTH

## 2019-07-23 LAB — MAGNESIUM: Magnesium: 2.6 mg/dL — ABNORMAL HIGH (ref 1.7–2.4)

## 2019-07-23 LAB — PHOSPHORUS: Phosphorus: 3.3 mg/dL (ref 2.5–4.6)

## 2019-07-23 MED ORDER — HYDROCODONE-HOMATROPINE 5-1.5 MG/5ML PO SYRP
5.0000 mL | ORAL_SOLUTION | Freq: Three times a day (TID) | ORAL | Status: DC | PRN
  Administered 2019-07-24 – 2019-07-28 (×7): 5 mL via ORAL
  Filled 2019-07-23 (×7): qty 5

## 2019-07-23 MED ORDER — LACTATED RINGERS IV SOLN: INTRAVENOUS | Status: DC

## 2019-07-23 MED ORDER — SODIUM CHLORIDE 0.9 % IV BOLUS
500.0000 mL | Freq: Once | INTRAVENOUS | Status: AC
  Administered 2019-07-23: 500 mL via INTRAVENOUS

## 2019-07-23 MED ORDER — LACTATED RINGERS IV BOLUS
500.0000 mL | Freq: Once | INTRAVENOUS | Status: AC
  Administered 2019-07-23: 500 mL via INTRAVENOUS

## 2019-07-23 MED ORDER — DEXTROSE IN LACTATED RINGERS 5 % IV SOLN: INTRAVENOUS | Status: DC

## 2019-07-23 MED ORDER — QUETIAPINE FUMARATE 25 MG PO TABS
25.0000 mg | ORAL_TABLET | Freq: Every day | ORAL | Status: DC
  Administered 2019-07-23: 25 mg via ORAL

## 2019-07-23 NOTE — Progress Notes (Signed)
Subjective: sleepier today and not following commands.  Transferred to step down last night.   Past Medical History:  Diagnosis Date  . Anxiety   . COPD (chronic obstructive pulmonary disease) (HCC)   . Depression   . Hydrocephalus [facility])     Past Surgical History:  Procedure Laterality Date  . BRAIN SURGERY  2003   VA shunt for hydrocephalus  . ventricular shunt      Family History  Problem Relation Age of Onset  . Diabetes Mellitus II Mother   . CAD Father   . Hypertension Father   . Arthritis/Rheumatoid Sister   . Hyperlipidemia Sister   . Hypertension Sister   . Depression Sister   . Anxiety disorder Sister   . Diabetes Sister   . Heart disease Brother   . Other Brother        DDD  . Diabetes Sister   . Diabetes Sister   . Arthritis/Rheumatoid Brother   . Other Brother        DDD    Social History:  reports that he has been smoking cigarettes. He has a 40.00 pack-year smoking history. He has never used smokeless tobacco. He reports that he does not drink alcohol or use drugs.  Allergies  Allergen Reactions  . Acetaminophen Other (See Comments)    Syncope  . Percocet [Oxycodone-Acetaminophen]      Physical Examination: Blood pressure 103/76, pulse 70, temperature 97.6 F (36.4 C), temperature source Axillary, resp. rate (!) 23, height 5\' 7"  (1.702 m), weight 55.3 kg, SpO2 98 %.  Tremors noted Withdraws from pain in all his extremities Slow to respond No clear focality on exam.   Laboratory Studies:   Basic Metabolic Panel: Recent Labs  Lab 07/20/19 1650 07/20/19 1650 07/21/19 1935 07/21/19 1935 07/22/19 0437 07/22/19 1903 07/23/19 0519  NA 139  --  141  --  142 139 139  K 4.5  --  4.1  --  3.9 4.9 4.2  CL 103  --  105  --  102 101 97*  CO2 26  --  29  --  32 32 33*  GLUCOSE 178*  --  122*  --  89 71 122*  BUN 29*  --  33*  --  26* 26* 22*  CREATININE 1.20  --  1.13  --  1.03 1.05 0.92  CALCIUM 9.2   < > 9.4   < > 8.8* 8.9 8.4*  MG  --   --    --   --   --   --  2.6*  PHOS  --   --   --   --   --   --  3.3   < > = values in this interval not displayed.    Liver Function Tests: Recent Labs  Lab 07/20/19 1650 07/21/19 1935 07/22/19 0437 07/22/19 1903  AST 19 29 16 19   ALT 14 19 19 21   ALKPHOS 94 73 62 69  BILITOT 0.5 0.6 0.7 0.4  PROT 7.5 6.4* 5.7* 6.4*  ALBUMIN 3.9 3.4* 3.1* 3.5   Recent Labs  Lab 07/20/19 1650  LIPASE 108*   No results for input(s): AMMONIA in the last 168 hours.  CBC: Recent Labs  Lab 07/20/19 1650 07/20/19 1650 07/20/19 1922 07/21/19 1935 07/22/19 0437 07/22/19 1903 07/23/19 0519  WBC 12.5*   < > 9.8 11.8* 10.2 7.9 8.3  NEUTROABS 11.9*  --   --  10.2*  --   --   --  HGB 14.9   < > 13.4 13.0 12.1* 13.0 12.1*  HCT 46.7   < > 42.0 41.5 38.9* 41.0 37.3*  MCV 97.5   < > 97.7 97.9 100.0 98.6 96.4  PLT 315   < > 219 240 194 198 171   < > = values in this interval not displayed.    Cardiac Enzymes: No results for input(s): CKTOTAL, CKMB, CKMBINDEX, TROPONINI in the last 168 hours.  BNP: Invalid input(s): POCBNP  CBG: Recent Labs  Lab 07/22/19 1856 07/22/19 1952 07/23/19 0014 07/23/19 0432 07/23/19 0749  GLUCAP 75 80 119* 119* 92    Microbiology: Results for orders placed or performed during the hospital encounter of 07/20/19  SARS CORONAVIRUS 2 (TAT 6-24 HRS) Nasopharyngeal Nasopharyngeal Swab     Status: None   Collection Time: 07/20/19  8:09 PM   Specimen: Nasopharyngeal Swab  Result Value Ref Range Status   SARS Coronavirus 2 NEGATIVE NEGATIVE Final    Comment: (NOTE) SARS-CoV-2 target nucleic acids are NOT DETECTED. The SARS-CoV-2 RNA is generally detectable in upper and lower respiratory specimens during the acute phase of infection. Negative results do not preclude SARS-CoV-2 infection, do not rule out co-infections with other pathogens, and should not be used as the sole basis for treatment or other patient management decisions. Negative results must be  combined with clinical observations, patient history, and epidemiological information. The expected result is Negative. Fact Sheet for Patients: SugarRoll.be Fact Sheet for Healthcare Providers: https://www.woods-mathews.com/ This test is not yet approved or cleared by the [place] FDA and  has been authorized for detection and/or diagnosis of SARS-CoV-2 by FDA under an Emergency Use Authorization (EUA). This EUA will remain  in effect (meaning this test can be used) for the duration of the COVID-19 declaration under Section 56 4(b)(1) of the Act, 21 U.S.C. section 360bbb-3(b)(1), unless the authorization is terminated or revoked sooner. Performed at [facility], [place] [address], [place], [place] [address]   Culture, blood (x 2)     Status: None (Preliminary result)   Collection Time: 07/21/19  7:37 PM   Specimen: BLOOD  Result Value Ref Range Status   Specimen Description BLOOD LEFT ANTECUBITAL  Final   Special Requests   Final    BOTTLES DRAWN AEROBIC AND ANAEROBIC Blood Culture adequate volume   Culture   Final    NO GROWTH 2 DAYS Performed at [facility], [address], [place], [place] [address]    Report Status PENDING  Incomplete  Culture, blood (x 2)     Status: None (Preliminary result)   Collection Time: 07/21/19  7:38 PM   Specimen: BLOOD  Result Value Ref Range Status   Specimen Description BLOOD BLOOD LEFT HAND  Final   Special Requests   Final    BOTTLES DRAWN AEROBIC AND ANAEROBIC Blood Culture adequate volume   Culture   Final    NO GROWTH 2 DAYS Performed at [facility], [address] [place], [place] [address]    Report Status PENDING  Incomplete  Urine culture     Status: None   Collection Time: 07/22/19  6:00 AM   Specimen: Urine, Random  Result Value Ref Range Status   Specimen Description   Final    URINE, RANDOM Performed at [facility], [address],  [place], [place] [address]    Special Requests   Final    NONE Performed at [facility], [address] [place], [place]  [address]    Culture   Final    NO GROWTH Performed at [facility], [address], [place], [place] [address]    Report Status 07/23/2019 FINAL  Final  MRSA PCR Screening     Status: None   Collection Time: 07/22/19 12:07 PM   Specimen: Nasal Mucosa; Nasopharyngeal  Result Value Ref Range Status   MRSA by PCR NEGATIVE NEGATIVE Final    Comment:        The GeneXpert MRSA Assay (FDA approved for NASAL specimens only), is one component of a comprehensive MRSA colonization surveillance program. It is not intended to diagnose MRSA infection nor to guide or monitor treatment for MRSA infections. Performed at [facility], [address] [place], [place] [address]     Coagulation Studies: Recent Labs    07/21/19 1935  LABPROT 12.3  INR 0.9    Urinalysis:  Recent Labs  Lab 07/22/19 0600  COLORURINE STRAW*  LABSPEC 1.028  PHURINE 5.0  GLUCOSEU NEGATIVE  HGBUR NEGATIVE  BILIRUBINUR NEGATIVE  KETONESUR NEGATIVE  PROTEINUR NEGATIVE  NITRITE NEGATIVE  LEUKOCYTESUR NEGATIVE    Lipid Panel:     Component Value Date/Time   CHOL 168 08/12/2017 1607   TRIG 47 07/23/2019 0519   HDL 32 (L) 08/12/2017 1607   CHOLHDL 5.3 (H) 08/12/2017 1607   LDLCALC 118 (H) 08/12/2017 1607    HgbA1C:  Lab Results  Component Value Date   HGBA1C 6.1 (H) 07/21/2019    Urine Drug Screen:      Component Value Date/Time   LABOPIA POSITIVE (A) 07/22/2019 2128   COCAINSCRNUR NONE DETECTED 07/22/2019 2128   LABBENZ POSITIVE (A) 07/22/2019 2128   AMPHETMU NONE DETECTED 07/22/2019 2128   THCU NONE DETECTED 07/22/2019 2128   LABBARB NONE DETECTED 07/22/2019 2128    Alcohol Level: No results for input(s): ETH in the last 168 hours.  Other results: EKG: normal EKG, normal sinus rhythm, unchanged from previous tracings.  Imaging: DG Abd 1  View  Result Date: 07/21/2019 CLINICAL DATA:  59 year old male with abdominal distension. EXAM: ABDOMEN - 1 VIEW COMPARISON:  None. FINDINGS: There is no bowel dilatation or evidence of obstruction. Mild air distended stomach. Mild thickened appearance of the wall of the stomach may be related to underdistention or represent gastritis. There is delineation of the gastric wall which may be artifactual. If there is clinical concern for pneumoperitoneum further evaluation with CT is recommended. No radiopaque calculi. There is osteopenia with degenerative changes of the spine. No acute osseous pathology. IMPRESSION: 1. No evidence of bowel obstruction. 2. Artifact versus less likely pneumoperitoneum. Clinical correlation is recommended. Electronically Signed   By: Elgie Collard M.D.   On: 07/21/2019 16:37   CT HEAD WO CONTRAST  Result Date: 07/22/2019 CLINICAL DATA:  59 year old with an indwelling ventriculoperitoneal shunt, presenting with speech difficulty. EXAM: CT HEAD WITHOUT CONTRAST TECHNIQUE: Contiguous axial images were obtained from the base of the skull through the vertex without intravenous contrast. COMPARISON:  11/12/2011. FINDINGS: Patient motion blurred many of the images, and persistent motion was present on the repeat imaging. Therefore, the study is less than optimal. Brain: Ventriculoperitoneal shunt catheter INNER is from a RIGHT parieto-occipital approach and has its tip in the frontal horn of the LEFT LATERAL ventricle. Mild lateral and third ventriculomegaly, unchanged since the 2013 examination. Moderate changes of small vessel disease of the white matter diffusely, progressive since prior CT. Allowing for the motion degradation, no visible hemorrhage or hematoma, extra-axial  fluid collections, or evidence of acute stroke. Vascular: Mild-to-moderate age advanced BILATERAL carotid siphon atherosclerosis. No visible hyperdense vessel. Skull: No skull fracture or other focal osseous  abnormality involving the skull. Sinuses/Orbits: Visualized paranasal sinuses, bilateral mastoid air cells and bilateral middle ear cavities well-aerated. Visualized orbits and globes normal in appearance. Other: None. IMPRESSION: 1. Motion degraded examination demonstrates no acute intracranial abnormality. 2. Stable mild lateral and third ventriculomegaly with ventriculoperitoneal shunt catheter in place. 3. Moderate chronic microvascular ischemic changes of the white matter. Electronically Signed   By: Hulan Saas M.D.   On: 07/22/2019 13:28   CT ABDOMEN PELVIS W CONTRAST  Result Date: 07/21/2019 CLINICAL DATA:  59 year old male with abdominal distension. Concern for small bowel obstruction. EXAM: CT ABDOMEN AND PELVIS WITH CONTRAST TECHNIQUE: Multidetector CT imaging of the abdomen and pelvis was performed using the standard protocol following bolus administration of intravenous contrast. CONTRAST:  69mL OMNIPAQUE IOHEXOL 300 MG/ML  SOLN COMPARISON:  CT of the chest abdomen pelvis dated 03/30/2014. Abdominal radiograph dated 07/21/2019. FINDINGS: Lower chest: There is emphysematous changes of the lungs. Partially visualized chronic loculated bilateral pleural effusions, left greater right. There is a focal area of atelectasis versus scarring in the adjacent left lung base. No intra-abdominal free air or free fluid. Hepatobiliary: The liver is grossly unremarkable. No intrahepatic biliary ductal dilatation. No calcified gallstone or pericholecystic fluid. Pancreas: Scattered small pancreatic calcification sequela of chronic pancreatitis. No active inflammatory changes. No dilatation of the main pancreatic duct. Spleen: Normal in size without focal abnormality. Adrenals/Urinary Tract: The adrenal glands are unremarkable. There is no hydronephrosis on either side. There is symmetric enhancement and excretion of contrast by both kidneys. The visualized ureters and urinary bladder appear unremarkable.  Stomach/Bowel: There is moderate stool throughout the colon. There is no bowel obstruction or active inflammation. The appendix is not visualized with certainty. No inflammatory changes identified in the right lower quadrant. Vascular/Lymphatic: Mild aortoiliac atherosclerotic disease. The IVC is unremarkable. No portal venous gas. There is no adenopathy. Reproductive: The prostate and seminal vesicles are grossly unremarkable. No pelvic mass. Other: None Musculoskeletal: Osteopenia with degenerative changes of the spine. No acute osseous pathology. IMPRESSION: 1. No acute intra-abdominal or pelvic pathology. No bowel obstruction. 2. Chronic loculated bilateral pleural effusions, left greater right. 3. Aortic Atherosclerosis (ICD10-I70.0). Electronically Signed   By: Elgie Collard M.D.   On: 07/21/2019 23:19   DG Chest Port 1 View  Result Date: 07/21/2019 CLINICAL DATA:  59 year old male with shortness of breath. History of COPD. EXAM: PORTABLE CHEST 1 VIEW COMPARISON:  Chest radiograph dated 07/20/2019 and CT dated 07/20/2019 FINDINGS: Small left pleural effusion and left lung base atelectasis similar to prior radiograph. Pneumonia is not excluded clinical correlation is recommended. Similar appearance of the left lateral mid lung field pleuroparenchymal scarring. There is background of emphysema. No pneumothorax. Stable cardiomediastinal silhouette. No acute osseous pathology. IMPRESSION: 1. Small left pleural effusion and left lung base atelectasis versus infiltrate. No interval change since the prior radiograph. 2. Emphysema. Electronically Signed   By: Elgie Collard M.D.   On: 07/21/2019 19:36     Assessment/Plan:  59 y.o. male with medical history significant for hydrocephalus with known VP shunt, severe COPD with chronic respiratory failure on home O2 at 3 L, anxiety and restless leg syndrome who presented to the emergency room with severe respiratory distress and suspicion of sepsis on Vanc  and Zosyn  - symptoms likely related to metabolic encephalopathy/delirium in setting of poor oxygenation  -  CTH done yesterday no acute abnormalities - no medication changed from neurological stand point for tremor - As delirium improves so should the tremor and speech.   07/23/2019, 11:49 AM

## 2019-07-23 NOTE — Progress Notes (Signed)
PROGRESS NOTE  Austin Blevins CBJ:[id number] DOB: 11-12-1960 DOA: 07/20/2019 PCP: [name], NP  Brief History    Austin Blevins is a 59 y.o. male with medical history significant for hydrocephalus with known VP shunt, severe COPD with chronic respiratory failure on home O2 at 3 L, anxiety and restless leg syndrome who presented to the emergency room with severe respiratory distress and complaint of left  lateral chest pain and mid to lower back pain. Symptoms have been ongoing for 4 days. Pain is worse with movement and coughing and is of moderate intensity described as sharp and nonradiating. He has no associated nausea vomiting or diaphoresis. Shortness of breath worsens the chest pain.  He denies a fall. He denies fever, chills, abdominal pain, nausea vomiting or change in bowel movement.  He saw his pulmonologist a few days prior for follow-up and received a prescription for steroid taper pack and tramadol for the back pain.  On arrival to the emergency room, per ED provider he was initially very tachypneic at 30, using accessory muscles to breathe. O2 sat maintained at 90% on his home flow rate at 3 L. He was also tachycardic at 134 but afebrile. BP 172/86. On his blood work, BNP 44 troponin less than 2, white cell count 12,500 with otherwise unremarkable blood work. EKG showed no acute ST-T wave changes. He had a CTA of his chest which was negative for PE but showed advanced emphysema and stable pulmonary nodules also showed a new T7 fracture. Patient was given DuoNeb treatments in the emergency room as well as IV steroids with significant improvement in his breathing. Hospitalist consulted for admission.  The patient has been admitted to a telemetry bed. He is receiving steroids, antibiotics and nebulizer treatments. He is also receiving pain control. Sinus tachycardia has been addressed with ativan.   On 07/21/2019 the patient had increased heart rate and increased oxygen requirements. CXR  demonstrated a small left pleural effusion and emphysema. No change from previous. EKG demonstrated sinus tachycardia which was addressed with ativan.   On 07/22/2019 his wife expressed to nursing that the patient's tremors were worse than usual and that his speech was less intelligible than normal. CT head was performed and was unchanged from previous. Neurology was consulted. They felt that the patient was delerious and recommended antipsychotic for sleep. The patient was transferred to the ICU and started on a precedex drip. This has been able to be discharged to off. Mental status is improved. IV fluid bolus has been given for hypotension on the afternoon of 07/23/2019.  Consultants  . Neurology  Procedures  . None  Antibiotics   Anti-infectives (From admission, onward)   Start     Dose/Rate Route Frequency Ordered Stop   07/22/19 2200  vancomycin (VANCOREADY) IVPB 1250 mg/250 mL  Status:  Discontinued     1,250 mg 166.7 mL/hr over 90 Minutes Intravenous Every 24 hours 07/21/19 2119 07/22/19 1657   07/21/19 2200  piperacillin-tazobactam (ZOSYN) IVPB 3.375 g  Status:  Discontinued     3.375 g 12.5 mL/hr over 240 Minutes Intravenous Every 8 hours 07/21/19 2117 07/22/19 1657   07/21/19 2130  vancomycin (VANCOREADY) IVPB 1250 mg/250 mL     1,250 mg 166.7 mL/hr over 90 Minutes Intravenous  Once 07/21/19 2117 07/22/19 0120   07/20/19 1830  azithromycin (ZITHROMAX) 500 mg in sodium chloride 0.9 % 250 mL IVPB     500 mg 250 mL/hr over 60 Minutes Intravenous  Once 07/20/19 1824  07/20/19 1956     Subjective  At the time that the patient was seen this am, he was still sedated on precedex. No new complaints.  Objective   Vitals:  Vitals:   07/23/19 1500 07/23/19 1600  BP: (!) 84/64 (!) 81/58  Pulse: 64 61  Resp: (!) 28 (!) 28  Temp:  98 F (36.7 C)  SpO2: 98% 97%   Exam:  Constitutional:  The patient is sedated. Respiratory:  . No increased work of breathing. [name] Kitchen Positive for  rhonchi throughout as well as scattered wheezes. . No rales. . No tactile fremitus Cardiovascular:  . Rate is fast, but regular. . No murmurs, ectopy, or gallups. . No lateral PMI. No thrills. Abdomen:  . Abdomen is soft, non-tender, non-distended . Scaffoid . No hernias, masses, or organomegaly . Normoactive bowel sounds.  Musculoskeletal:  . No cyanosis, clubbing, or edema . Cachectic Skin:  . No rashes, lesions, ulcers . palpation of skin: no induration or nodules Neurologic:  . Unable to evaluate as the patient is currently sedated. Psychiatric:  . Unable to evaluate as the patient is sedated.  I have personally reviewed the following:   Today's Data  . Vitals, CBC  Imaging  . CXR . CTA chest  EKG: Sinus tachycardia  Scheduled Meds: . budesonide (PULMICORT) nebulizer solution  0.5 mg Nebulization BID  . busPIRone  10 mg Oral BID  . Chlorhexidine Gluconate Cloth  6 each Topical Daily  . enoxaparin (LOVENOX) injection  40 mg Subcutaneous Q24H  . insulin aspart  0-9 Units Subcutaneous TID WC  . ipratropium  0.5 mg Nebulization Q6H  . levalbuterol  1.25 mg Nebulization Q6H  . montelukast  10 mg Oral QHS  . multivitamin with minerals  1 tablet Oral Daily  . pantoprazole  40 mg Oral Daily  . predniSONE  10 mg Oral Q breakfast  . QUEtiapine  25 mg Oral QHS  . QUEtiapine  50 mg Oral Daily  . rOPINIRole  1 mg Oral q AM  . rOPINIRole  2 mg Oral QHS  . sertraline  150 mg Oral Daily  . tiotropium  18 mcg Inhalation Daily   Continuous Infusions: . dexmedetomidine (PRECEDEX) IV infusion 0.6 mcg/kg/hr (07/23/19 1600)  . dextrose 75 mL/hr at 07/23/19 1600  . sodium chloride     And  . sodium chloride      Principal Problem:   COPD with acute exacerbation (HCC) Active Problems:   Chronic respiratory failure with hypoxia (HCC)   Pulmonary nodules   Chest pain at rest   Thoracic vertebral fracture (HCC)   COPD exacerbation (HCC)   LOS: 2 days    A & P    Acute on chronic respiratory failure with hypoxia (HCC): The patient uses O2 at 3L by [place] at home chronically. He is currently saturating at 98% on 3L. However, he develops respiratory distress with coughing that occurs with any activity.    COPD with acute exacerbation: The patients CXR and CT document his severe COPD and emphysema. At home his pulmonologist had treated the patient with a steroid taper and pain meds. As inpatient the patient is being treated with IV steroids, azithromycin, mucolytics, and nebulizer treatment. He is currently saturating in the high 90's on his home O2 requirements.  Acute Bronchitis: Resulting in COPD exacerbation. Steroids, nebs, mucolytics and azithromycin.  Delerium: Will start bedtime seroquel as per neurology. CT head was performed and was unchanged from previous. Neurology was consulted. They felt that the  patient was delerious and recommended antipsychotic for sleep. The patient was transferred to the ICU and started on a precedex drip. This has been able to be discharged to off. Mental status is improved.   Hypotension: IV fluid bolus has been given for hypotension on the afternoon of 07/23/2019.  Pulmonary nodules: CTA chest demonstrated unchanged bilateral pulmonary nodules with the largest in the right lower lobe. Follow is recommended as per previous recommendations per radiology.  Abdominal distention: Abdominal film raised a question of perigastric pneumoperitoniem. CT abdomen was performed which showed no obstruction, moderate stool load throughout the colonic confines, and no pneumoperitoneum.   Chest pain: Primarily occurs with activity, but especially with cough. Pain is minimal while at rest. EKG demonstrated no ischemic changes, and troponins were negative. The patient's pleuritic chest pain is likely due to chest wall soreness from cough, as well as pain from acute thoracic vertebral fracture. Pt denies recent fall or injury. He also has pain with  activity probably due to the vertebral body fracture. He is receiving hycodan to suppress his cough as well as as needed oxycodone for pain.   Acute T7 vertebral body fracture: Most certainly largely to blame for the patient's discomfort, but also possibly a consequence of the patient's cough due to acute bronchitis and his chronic on and off steroid use for severe COPD. Doubt that this fracture and the patient's osteopenic spine would be appropriate for kyphoplasty. Will add toradol to pain control and consult PT/OT for mobility.  Chronic tremors: Likely due to the patient's history of hydrocephalus. Chronic. Worse today. Neurology does not see anything new in CT head.   Anxiety: Continue BuSpar  Hydrocephalus with history of VP shunt: Likely cause of the patient's chronic and severe tremors. No acute concerns at this time related to the hydrocephalus itself.  I have seen and examined this patient myself. I have spent 38 minutes in his evaluation and care.  DVT prophylaxis: Lovenox  Code Status: full code  Family Communication:  none  Disposition Plan: Patient is from home. Anticipated discharge would be back to previous home environment when cough, pain and tremor are under control.  Camaya Gannett, DO [facility] Direct contact: see [email or web address]  7PM-7AM contact night coverage as above 07/23/2019, 6:02 PM  LOS: 0 days

## 2019-07-23 NOTE — Progress Notes (Signed)
precedex titrated off, pt more awake and responsive, but remains confused, and talks minimally. He was able to take some of his PO meds crushed in applesauce w/no issues, fluid bolus given for hypotension

## 2019-07-23 NOTE — Progress Notes (Signed)
Updated pts sister Austin Blevins via telephone, informed her pt remains on precedex gtt.  He also received valium and iv fentanyl for pain overnight due to continued agitation with intermittent tremors.  Foley catheter inserted due to urinary retention, and prednisone dose decreased from 40 mg daily to 10 mg daily.  Following interventions pt rested throughout the night.  The pt does appear to have a significant hx of night terrors resulting in insomnia and multiple falls out of bed during these episodes at home.  This is well documented in pts records which may be contributing to severe delirium. Upon assessment this morning RN reported the pt can follow commands, appeared calmer, endorsed he has "really bad nightmares," and although he still has some slurred/garbled speech nursing staff reported this is slowly improving. Austin Blevins also stated he does not remember having any nightmares last night, and he does feel better.  Will continue to monitor and assess pt.    Sonda Rumble, AGNP  Pulmonary/Critical Care Pager [phone] (please enter 7 digits) PCCM Consult Pager [phone] (please enter 7 digits)

## 2019-07-23 NOTE — Progress Notes (Signed)
Updated pts sister Normand Sloop via telephone all questions were answered.  Sonda Rumble, AGNP  Pulmonary/Critical Care Pager [phone] (please enter 7 digits) PCCM Consult Pager [phone] (please enter 7 digits)

## 2019-07-24 ENCOUNTER — Telehealth: Payer: Self-pay | Admitting: Nurse Practitioner

## 2019-07-24 LAB — CBC WITH DIFFERENTIAL/PLATELET
Abs Immature Granulocytes: 0.05 10*3/uL (ref 0.00–0.07)
Basophils Absolute: 0 10*3/uL (ref 0.0–0.1)
Basophils Relative: 1 %
Eosinophils Absolute: 0.3 10*3/uL (ref 0.0–0.5)
Eosinophils Relative: 3 %
HCT: 38 % — ABNORMAL LOW (ref 39.0–52.0)
Hemoglobin: 11.9 g/dL — ABNORMAL LOW (ref 13.0–17.0)
Immature Granulocytes: 1 %
Lymphocytes Relative: 12 %
Lymphs Abs: 0.9 10*3/uL (ref 0.7–4.0)
MCH: 30.8 pg (ref 26.0–34.0)
MCHC: 31.3 g/dL (ref 30.0–36.0)
MCV: 98.4 fL (ref 80.0–100.0)
Monocytes Absolute: 0.5 10*3/uL (ref 0.1–1.0)
Monocytes Relative: 6 %
Neutro Abs: 6.1 10*3/uL (ref 1.7–7.7)
Neutrophils Relative %: 77 %
Platelets: 182 10*3/uL (ref 150–400)
RBC: 3.86 MIL/uL — ABNORMAL LOW (ref 4.22–5.81)
RDW: 12.5 % (ref 11.5–15.5)
WBC: 7.9 10*3/uL (ref 4.0–10.5)
nRBC: 0 % (ref 0.0–0.2)

## 2019-07-24 LAB — PHOSPHORUS: Phosphorus: 4 mg/dL (ref 2.5–4.6)

## 2019-07-24 LAB — BASIC METABOLIC PANEL
Anion gap: 4 — ABNORMAL LOW (ref 5–15)
BUN: 21 mg/dL — ABNORMAL HIGH (ref 6–20)
CO2: 33 mmol/L — ABNORMAL HIGH (ref 22–32)
Calcium: 8.6 mg/dL — ABNORMAL LOW (ref 8.9–10.3)
Chloride: 103 mmol/L (ref 98–111)
Creatinine, Ser: 1.01 mg/dL (ref 0.61–1.24)
GFR calc Af Amer: >60 mL/min (ref >60)
GFR calc non Af Amer: >60 mL/min (ref >60)
Glucose, Bld: 77 mg/dL (ref 70–99)
Potassium: 3.9 mmol/L (ref 3.5–5.1)
Sodium: 140 mmol/L (ref 135–145)

## 2019-07-24 LAB — GLUCOSE, CAPILLARY
Glucose-Capillary: 116 mg/dL — ABNORMAL HIGH (ref 70–99)
Glucose-Capillary: 162 mg/dL — ABNORMAL HIGH (ref 70–99)
Glucose-Capillary: 175 mg/dL — ABNORMAL HIGH (ref 70–99)
Glucose-Capillary: 221 mg/dL — ABNORMAL HIGH (ref 70–99)
Glucose-Capillary: 61 mg/dL — ABNORMAL LOW (ref 70–99)
Glucose-Capillary: 88 mg/dL (ref 70–99)
Glucose-Capillary: 89 mg/dL (ref 70–99)

## 2019-07-24 LAB — MAGNESIUM: Magnesium: 2.3 mg/dL (ref 1.7–2.4)

## 2019-07-24 MED ORDER — FOLIC ACID 1 MG PO TABS
1.0000 mg | ORAL_TABLET | Freq: Every day | ORAL | Status: DC
  Administered 2019-07-24 – 2019-08-02 (×9): 1 mg via ORAL
  Filled 2019-07-24 (×9): qty 1

## 2019-07-24 MED ORDER — CHLORHEXIDINE GLUCONATE CLOTH 2 % EX PADS
6.0000 | MEDICATED_PAD | Freq: Every day | CUTANEOUS | Status: DC
  Administered 2019-07-24 – 2019-07-30 (×7): 6 via TOPICAL

## 2019-07-24 MED ORDER — CHLORDIAZEPOXIDE HCL 5 MG PO CAPS
10.0000 mg | ORAL_CAPSULE | Freq: Three times a day (TID) | ORAL | Status: DC
  Administered 2019-07-24 (×2): 10 mg via ORAL
  Filled 2019-07-24 (×2): qty 2

## 2019-07-24 MED ORDER — CHLORDIAZEPOXIDE HCL 5 MG PO CAPS: 10.0000 mg | ORAL_CAPSULE | Freq: Four times a day (QID) | ORAL | Status: DC

## 2019-07-24 MED ORDER — TRAZODONE HCL 50 MG PO TABS
50.0000 mg | ORAL_TABLET | Freq: Every evening | ORAL | Status: DC | PRN
  Administered 2019-07-24: 50 mg via ORAL
  Filled 2019-07-24: qty 1

## 2019-07-24 MED ORDER — ENSURE ENLIVE PO LIQD
237.0000 mL | Freq: Two times a day (BID) | ORAL | Status: DC
  Administered 2019-07-24 – 2019-08-02 (×15): 237 mL via ORAL

## 2019-07-24 MED ORDER — CHLORDIAZEPOXIDE HCL 25 MG PO CAPS
25.0000 mg | ORAL_CAPSULE | Freq: Three times a day (TID) | ORAL | Status: DC
  Administered 2019-07-24 – 2019-07-26 (×5): 25 mg via ORAL
  Filled 2019-07-24 (×5): qty 1

## 2019-07-24 MED ORDER — LACTATED RINGERS IV BOLUS
500.0000 mL | Freq: Once | INTRAVENOUS | Status: AC
  Administered 2019-07-24: 500 mL via INTRAVENOUS

## 2019-07-24 MED ORDER — THIAMINE HCL 100 MG PO TABS
100.0000 mg | ORAL_TABLET | Freq: Every day | ORAL | Status: DC
  Administered 2019-07-24 – 2019-08-02 (×9): 100 mg via ORAL
  Filled 2019-07-24 (×9): qty 1

## 2019-07-24 NOTE — Progress Notes (Addendum)
PROGRESS NOTE  Austin Blevins ZOX:[id number] DOB: [birth year] DOA: 07/20/2019 PCP: [name], NP  Brief History    Austin Blevins is a 59 y.o. male with medical history significant for hydrocephalus with known VP shunt, severe COPD with chronic respiratory failure on home O2 at 3 L, anxiety and restless leg syndrome who presented to the emergency room with severe respiratory distress and complaint of left  lateral chest pain and mid to lower back pain. Symptoms have been ongoing for 4 days. Pain is worse with movement and coughing and is of moderate intensity described as sharp and nonradiating. He has no associated nausea vomiting or diaphoresis. Shortness of breath worsens the chest pain.  He denies a fall. He denies fever, chills, abdominal pain, nausea vomiting or change in bowel movement.  He saw his pulmonologist a few days prior for follow-up and received a prescription for steroid taper pack and tramadol for the back pain.  On arrival to the emergency room, per ED provider he was initially very tachypneic at 30, using accessory muscles to breathe. O2 sat maintained at 90% on his home flow rate at 3 L. He was also tachycardic at 134 but afebrile. BP 172/86. On his blood work, BNP 44 troponin less than 2, white cell count 12,500 with otherwise unremarkable blood work. EKG showed no acute ST-T wave changes. He had a CTA of his chest which was negative for PE but showed advanced emphysema and stable pulmonary nodules also showed a new T7 fracture. Patient was given DuoNeb treatments in the emergency room as well as IV steroids with significant improvement in his breathing. Hospitalist consulted for admission.  The patient has been admitted to a telemetry bed. He is receiving steroids, antibiotics and nebulizer treatments. He is also receiving pain control. Sinus tachycardia has been addressed with ativan.   On 07/21/2019 the patient had increased heart rate and increased oxygen requirements. CXR  demonstrated a small left pleural effusion and emphysema. No change from previous. EKG demonstrated sinus tachycardia which was addressed with ativan.   On 07/22/2019 his wife expressed to nursing that the patient's tremors were worse than usual and that his speech was less intelligible than normal. CT head was performed and was unchanged from previous. Neurology was consulted. They felt that the patient was delerious and recommended antipsychotic for sleep. The patient was transferred to the ICU and started on a precedex drip. This has been able to be weaned to off. Mental status is improved. IV fluid bolus has been given for hypotension on the afternoon of 07/23/2019.  Consultants  Neurology  Procedures  None  Antibiotics   Anti-infectives (From admission, onward)    Start     Dose/Rate Route Frequency Ordered Stop   07/22/19 2200  vancomycin (VANCOREADY) IVPB 1250 mg/250 mL  Status:  Discontinued     1,250 mg 166.7 mL/hr over 90 Minutes Intravenous Every 24 hours 07/21/19 2119 07/22/19 1657   07/21/19 2200  piperacillin-tazobactam (ZOSYN) IVPB 3.375 g  Status:  Discontinued     3.375 g 12.5 mL/hr over 240 Minutes Intravenous Every 8 hours 07/21/19 2117 07/22/19 1657   07/21/19 2130  vancomycin (VANCOREADY) IVPB 1250 mg/250 mL     1,250 mg 166.7 mL/hr over 90 Minutes Intravenous  Once 07/21/19 2117 07/22/19 0120   07/20/19 1830  azithromycin (ZITHROMAX) 500 mg in sodium chloride 0.9 % 250 mL IVPB     500 mg 250 mL/hr over 60 Minutes Intravenous  Once 07/20/19 1824 07/20/19  1956      Subjective  The patient is now sitting up at bedside. Although still tremulous, he is awake and much more calm. He remains tachycardic and tachypneic.  Objective   Vitals:  Vitals:   07/24/19 1400 07/24/19 1627  BP: 126/82   Pulse: (!) 105   Resp: (!) 30   Temp:  98.6 F (37 C)  SpO2: 95%    Exam:  Constitutional:  The patient is awake, alert, and oriented x 3. He remains tachypneic and  tachycardic. Respiratory:  No increased work of breathing. Positive for rhonchi throughout as well as scattered wheezes. No rales. No tactile fremitus Cardiovascular:  Rate is fast, but regular. No murmurs, ectopy, or gallups. No lateral PMI. No thrills. Abdomen:  Abdomen is soft, non-tender, non-distended Scaffoid No hernias, masses, or organomegaly Normoactive bowel sounds.  Musculoskeletal:  No cyanosis, clubbing, or edema Cachectic Skin:  No rashes, lesions, ulcers palpation of skin: no induration or nodules Neurologic:  Tremulous. Pt is moving all extremities. Psychiatric:  Calm  I have personally reviewed the following:   Today's Data  Vitals, CBC, BMP  Imaging  CXR CTA chest  EKG: Sinus tachycardia Tele: Sinus tachycardia  Scheduled Meds:  budesonide (PULMICORT) nebulizer solution  0.5 mg Nebulization BID   busPIRone  10 mg Oral BID   chlordiazePOXIDE  10 mg Oral QID   Chlorhexidine Gluconate Cloth  6 each Topical Daily   enoxaparin (LOVENOX) injection  40 mg Subcutaneous Q24H   feeding supplement (ENSURE ENLIVE)  237 mL Oral BID BM   folic acid  1 mg Oral Daily   insulin aspart  0-9 Units Subcutaneous TID WC   multivitamin with minerals  1 tablet Oral Daily   pantoprazole  40 mg Oral Daily   predniSONE  10 mg Oral Q breakfast   QUEtiapine  25 mg Oral QHS   QUEtiapine  50 mg Oral Daily   rOPINIRole  1 mg Oral q AM   rOPINIRole  2 mg Oral QHS   sertraline  150 mg Oral Daily   thiamine  100 mg Oral Daily   tiotropium  18 mcg Inhalation Daily   Continuous Infusions:  lactated ringers 75 mL/hr at 07/24/19 1539    Principal Problem:   COPD with acute exacerbation (HCC) Active Problems:   Chronic respiratory failure with hypoxia (HCC)   Pulmonary nodules   Chest pain at rest   Thoracic vertebral fracture (HCC)   COPD exacerbation (HCC)   LOS: 3 days    A & P   Acute on chronic respiratory failure with hypoxia (HCC): The patient uses O2 at  3L by  at home chronically. He is currently saturating at 93% on 4L.   COPD with acute exacerbation: The patients CXR and CT document his severe COPD and emphysema. At home his pulmonologist had treated the patient with a steroid taper and pain meds. As inpatient the patient is being treated with IV steroids, azithromycin, mucolytics, and nebulizer treatment. He is currently saturating in the high 90's on his home O2 requirements.  Acute Bronchitis: Resulting in COPD exacerbation. Steroids, nebs, mucolytics and azithromycin.  Delerium: Will start bedtime seroquel as per neurology. CT head was performed and was unchanged from previous. Neurology was consulted. They felt that the patient was delerious and recommended antipsychotic for sleep. The patient was transferred to the ICU and started on a precedex drip. This has been able to be weaned to off. Mental status is improved, although the patient remains  tachypneic and tachycardic. Librium 10 mg q 8 hours has been started and seems to help, but not enough. Will increase Librium to 25 mg q 8 hours. I believe that the patient's delirium is likely due to benzodiazepine withdrawal. Monitor in ICU until vitals improve.  Hypotension: Resolved.  Pulmonary nodules: CTA chest demonstrated unchanged bilateral pulmonary nodules with the largest in the right lower lobe. Follow is recommended as per previous recommendations per radiology.  Abdominal distention: Abdominal film raised a question of perigastric pneumoperitoniem. CT abdomen was performed which showed no obstruction, moderate stool load throughout the colonic confines, and no pneumoperitoneum.   Chest pain: Primarily occurs with activity, but especially with cough. Pain is minimal while at rest. EKG demonstrated no ischemic changes, and troponins were negative. The patient's pleuritic chest pain is likely due to chest wall soreness from cough, as well as pain from acute thoracic vertebral fracture. Pt  denies recent fall or injury. He also has pain with activity probably due to the vertebral body fracture. He is receiving hycodan to suppress his cough as well as as needed oxycodone for pain.   Acute T7 vertebral body fracture: Most certainly largely to blame for the patient's discomfort, but also possibly a consequence of the patient's cough due to acute bronchitis and his chronic on and off steroid use for severe COPD. Doubt that this fracture and the patient's osteopenic spine would be appropriate for kyphoplasty. Will add toradol to pain control and consult PT/OT for mobility.  Chronic tremors: Likely due to the patient's history of hydrocephalus. Chronic. Worse today. Neurology does not see anything new in CT head.    Anxiety: Continue BuSpar   Hydrocephalus with history of VP shunt: Likely cause of the patient's chronic and severe tremors. No acute concerns at this time related to the hydrocephalus itself.  Severe protein calorie malnutrition: Nutrition consulted. Supplements ordered.   I have seen and examined this patient myself. I have spent 38 minutes in his evaluation and care.   DVT prophylaxis: Lovenox  Code Status: full code  Family Communication:  none  Disposition Plan: Patient is from home. Anticipated discharge would be back to previous home environment when cough, pain and tremor are under control.  Kalani Sthilaire, DO [facility] Direct contact: see [email or web address]  7PM-7AM contact night coverage as above 07/24/2019, 4:44 PM  LOS: 0 days

## 2019-07-24 NOTE — Progress Notes (Signed)
Subjective: Much improved compared to yesterday. Awake and follows commands   Past Medical History:  Diagnosis Date  . Anxiety   . COPD (chronic obstructive pulmonary disease) ([place])   . Depression   . Hydrocephalus [facility])     Past Surgical History:  Procedure Laterality Date  . BRAIN SURGERY  2003   VA shunt for hydrocephalus  . ventricular shunt      Family History  Problem Relation Age of Onset  . Diabetes Mellitus II Mother   . CAD Father   . Hypertension Father   . Arthritis/Rheumatoid Sister   . Hyperlipidemia Sister   . Hypertension Sister   . Depression Sister   . Anxiety disorder Sister   . Diabetes Sister   . Heart disease Brother   . Other Brother        DDD  . Diabetes Sister   . Diabetes Sister   . Arthritis/Rheumatoid Brother   . Other Brother        DDD    Social History:  reports that he has been smoking cigarettes. He has a 40.00 pack-year smoking history. He has never used smokeless tobacco. He reports that he does not drink alcohol or use drugs.  Allergies  Allergen Reactions  . Acetaminophen Other (See Comments)    Syncope  . Percocet [Oxycodone-Acetaminophen]      Physical Examination: Blood pressure 105/66, pulse (!) 118, temperature 98.4 F (36.9 C), temperature source Oral, resp. rate (!) 47, height 5\' 7"  (1.702 m), weight 55.3 kg, SpO2 91 %.   Neurological Examination   Mental Status: Alert, oriented, thought content appropriate.  Speech fluent without evidence of aphasia.   Cranial Nerves: II: Discs flat bilaterally III,IV, VI: ptosis not present, extra-ocular motions intact bilaterally V,VII: smile symmetric, facial light touch sensation normal bilaterally VIII: hearing normal bilaterally Motor: Some generalized weakness  Sensory: Pinprick and light touch intact throughout, bilaterally Deep Tendon Reflexes: 1+ and symmetric throughout    Laboratory Studies:   Basic Metabolic Panel: Recent Labs  Lab 07/21/19 1935  07/21/19 1935 07/22/19 0437 07/22/19 0437 07/22/19 1903 07/23/19 0519 07/24/19 0436  NA 141  --  142  --  139 139 140  K 4.1  --  3.9  --  4.9 4.2 3.9  CL 105  --  102  --  101 97* 103  CO2 29  --  32  --  32 33* 33*  GLUCOSE 122*  --  89  --  71 122* 77  BUN 33*  --  26*  --  26* 22* 21*  CREATININE 1.13  --  1.03  --  1.05 0.92 1.01  CALCIUM 9.4   < > 8.8*   < > 8.9 8.4* 8.6*  MG  --   --   --   --   --  2.6* 2.3  PHOS  --   --   --   --   --  3.3 4.0   < > = values in this interval not displayed.    Liver Function Tests: Recent Labs  Lab 07/20/19 1650 07/21/19 1935 07/22/19 0437 07/22/19 1903  AST 19 29 16 19   ALT 14 19 19 21   ALKPHOS 94 73 62 69  BILITOT 0.5 0.6 0.7 0.4  PROT 7.5 6.4* 5.7* 6.4*  ALBUMIN 3.9 3.4* 3.1* 3.5   Recent Labs  Lab 07/20/19 1650  LIPASE 108*   No results for input(s): AMMONIA in the last 168 hours.  CBC: Recent Labs  Lab 07/20/19 1650 07/20/19 1922 07/21/19 1935 07/22/19 0437 07/22/19 1903 07/23/19 0519 07/24/19 0436  WBC 12.5*   < > 11.8* 10.2 7.9 8.3 7.9  NEUTROABS 11.9*  --  10.2*  --   --   --  6.1  HGB 14.9   < > 13.0 12.1* 13.0 12.1* 11.9*  HCT 46.7   < > 41.5 38.9* 41.0 37.3* 38.0*  MCV 97.5   < > 97.9 100.0 98.6 96.4 98.4  PLT 315   < > 240 194 198 171 182   < > = values in this interval not displayed.    Cardiac Enzymes: No results for input(s): CKTOTAL, CKMB, CKMBINDEX, TROPONINI in the last 168 hours.  BNP: Invalid input(s): POCBNP  CBG: Recent Labs  Lab 07/23/19 2136 07/24/19 0010 07/24/19 0035 07/24/19 0419 07/24/19 0735  GLUCAP 84 61* 88 116* 89    Microbiology: Results for orders placed or performed during the hospital encounter of 07/20/19  SARS CORONAVIRUS 2 (TAT 6-24 HRS) Nasopharyngeal Nasopharyngeal Swab     Status: None   Collection Time: 07/20/19  8:09 PM   Specimen: Nasopharyngeal Swab  Result Value Ref Range Status   SARS Coronavirus 2 NEGATIVE NEGATIVE Final    Comment:  (NOTE) SARS-CoV-2 target nucleic acids are NOT DETECTED. The SARS-CoV-2 RNA is generally detectable in upper and lower respiratory specimens during the acute phase of infection. Negative results do not preclude SARS-CoV-2 infection, do not rule out co-infections with other pathogens, and should not be used as the sole basis for treatment or other patient management decisions. Negative results must be combined with clinical observations, patient history, and epidemiological information. The expected result is Negative. Fact Sheet for Patients: HairSlick.no Fact Sheet for Healthcare Providers: quierodirigir.com This test is not yet approved or cleared by the [place] FDA and  has been authorized for detection and/or diagnosis of SARS-CoV-2 by FDA under an Emergency Use Authorization (EUA). This EUA will remain  in effect (meaning this test can be used) for the duration of the COVID-19 declaration under Section 56 4(b)(1) of the Act, 21 U.S.C. section 360bbb-3(b)(1), unless the authorization is terminated or revoked sooner. Performed at [facility], [address] [place], [place] [address]   Culture, blood (x 2)     Status: None (Preliminary result)   Collection Time: 07/21/19  7:37 PM   Specimen: BLOOD  Result Value Ref Range Status   Specimen Description BLOOD LEFT ANTECUBITAL  Final   Special Requests   Final    BOTTLES DRAWN AEROBIC AND ANAEROBIC Blood Culture adequate volume   Culture   Final    NO GROWTH 3 DAYS Performed at [facility], [address] [place], [place] [address]    Report Status PENDING  Incomplete  Culture, blood (x 2)     Status: None (Preliminary result)   Collection Time: 07/21/19  7:38 PM   Specimen: BLOOD  Result Value Ref Range Status   Specimen Description BLOOD BLOOD LEFT HAND  Final   Special Requests   Final    BOTTLES DRAWN AEROBIC AND ANAEROBIC Blood Culture  adequate volume   Culture   Final    NO GROWTH 3 DAYS Performed at [facility], [address], [place], [place] [address]    Report Status PENDING  Incomplete  Urine culture     Status: None   Collection Time: 07/22/19  6:00 AM   Specimen: Urine, Random  Result Value Ref Range Status  Specimen Description   Final    URINE, RANDOM Performed at [facility], [address] [place], [place] [address]    Special Requests   Final    NONE Performed at [facility], [address], [place], [place] [address]    Culture   Final    NO GROWTH Performed at [facility], [address], [place], [place] [address]    Report Status 07/23/2019 FINAL  Final  MRSA PCR Screening     Status: None   Collection Time: 07/22/19 12:07 PM   Specimen: Nasal Mucosa; Nasopharyngeal  Result Value Ref Range Status   MRSA by PCR NEGATIVE NEGATIVE Final    Comment:        The GeneXpert MRSA Assay (FDA approved for NASAL specimens only), is one component of a comprehensive MRSA colonization surveillance program. It is not intended to diagnose MRSA infection nor to guide or monitor treatment for MRSA infections. Performed at [facility], [address] [place], [place] [address]     Coagulation Studies: Recent Labs    07/21/19 1935  LABPROT 12.3  INR 0.9    Urinalysis:  Recent Labs  Lab 07/22/19 0600  COLORURINE STRAW*  LABSPEC 1.028  PHURINE 5.0  GLUCOSEU NEGATIVE  HGBUR NEGATIVE  BILIRUBINUR NEGATIVE  KETONESUR NEGATIVE  PROTEINUR NEGATIVE  NITRITE NEGATIVE  LEUKOCYTESUR NEGATIVE    Lipid Panel:     Component Value Date/Time   CHOL 168 08/12/2017 1607   TRIG 47 07/23/2019 0519   HDL 32 (L) 08/12/2017 1607   CHOLHDL 5.3 (H) 08/12/2017 1607   LDLCALC 118 (H) 08/12/2017 1607    HgbA1C:  Lab Results  Component Value Date   HGBA1C 6.1 (H) 07/21/2019    Urine Drug Screen:      Component Value Date/Time   LABOPIA  POSITIVE (A) 07/22/2019 2128   COCAINSCRNUR NONE DETECTED 07/22/2019 2128   LABBENZ POSITIVE (A) 07/22/2019 2128   AMPHETMU NONE DETECTED 07/22/2019 2128   THCU NONE DETECTED 07/22/2019 2128   LABBARB NONE DETECTED 07/22/2019 2128    Alcohol Level: No results for input(s): ETH in the last 168 hours.  Other results: EKG: normal EKG, normal sinus rhythm, unchanged from previous tracings.  Imaging: CT HEAD WO CONTRAST  Result Date: 07/22/2019 CLINICAL DATA:  59 year old with an indwelling ventriculoperitoneal shunt, presenting with speech difficulty. EXAM: CT HEAD WITHOUT CONTRAST TECHNIQUE: Contiguous axial images were obtained from the base of the skull through the vertex without intravenous contrast. COMPARISON:  11/12/2011. FINDINGS: Patient motion blurred many of the images, and persistent motion was present on the repeat imaging. Therefore, the study is less than optimal. Brain: Ventriculoperitoneal shunt catheter INNER is from a RIGHT parieto-occipital approach and has its tip in the frontal horn of the LEFT LATERAL ventricle. Mild lateral and third ventriculomegaly, unchanged since the 2013 examination. Moderate changes of small vessel disease of the white matter diffusely, progressive since prior CT. Allowing for the motion degradation, no visible hemorrhage or hematoma, extra-axial fluid collections, or evidence of acute stroke. Vascular: Mild-to-moderate age advanced BILATERAL carotid siphon atherosclerosis. No visible hyperdense vessel. Skull: No skull fracture or other focal osseous abnormality involving the skull. Sinuses/Orbits: Visualized paranasal sinuses, bilateral mastoid air cells and bilateral middle ear cavities well-aerated. Visualized orbits and globes normal in appearance. Other: None. IMPRESSION: 1. Motion degraded examination demonstrates no acute intracranial abnormality. 2. Stable mild lateral and third ventriculomegaly with ventriculoperitoneal shunt catheter in place. 3.  Moderate chronic microvascular ischemic  changes of the white matter. Electronically Signed   By: Hulan Saas M.D.   On: 07/22/2019 13:28     Assessment/Plan:  59 y.o. male with medical history significant for hydrocephalus with known VP shunt, severe COPD with chronic respiratory failure on home O2 at 3 L, anxiety and restless leg syndrome who presented to the emergency room with severe respiratory distress and suspicion of sepsis on Vanc and Zosyn  - Much improved overnight - Follows commands and able to have a fluent conversation  - tremor improved - agree with seroquel at night if still needed - likely transfer out of ICU/stepdown - will sign off.   07/24/2019, 11:39 AM

## 2019-07-24 NOTE — Consult Note (Signed)
Name: Austin Blevins MRN: [id number] DOB: 03-23-1961     CONSULTATION DATE: 07/20/2019  REFERRING MD : TRH  CHIEF COMPLAINT:acute mental status changes    HISTORY OF PRESENT ILLNESS:   59 y.o. male with medical history significant for hydrocephalus with known VP shunt, severe COPD with chronic respiratory failure on home O2 at 3 L, anxiety and restless leg syndrome who presented to the emergency room with severe respiratory distressand suspicion of sepsis on Vanc and Zosyn  - Much improved overnight - Follows commands and able to have a fluent conversation  - tremor improved    Review of Systems: Alert and awake Gen:  Denies  fever, sweats, chills weight loss  HEENT: Denies blurred vision, double vision, ear pain, eye pain, hearing loss, nose bleeds, sore throat Cardiac:  No dizziness, chest pain or heaviness, chest tightness,edema, No JVD Resp:   No cough, -sputum production, -shortness of breath,-wheezing, -hemoptysis,  Other:  All other systems negative  VITAL SIGNS: Temp:  [97.6 F (36.4 C)-99 F (37.2 C)] 99 F (37.2 C) (03/08 1200) Pulse Rate:  [58-128] 105 (03/08 1400) Resp:  [14-47] 30 (03/08 1400) BP: (78-126)/(47-87) 126/82 (03/08 1400) SpO2:  [91 %-100 %] 95 % (03/08 1400)   I/O last 3 completed shifts: In: 3029.7 [I.V.:2141; IV Piggyback:888.7] Out: 4065 [Urine:4065] Total I/O In: 1390.8 [P.O.:240; I.V.:1150.8] Out: 1375 [Urine:1375]   SpO2: 95 % O2 Flow Rate (L/min): 4 L/min    CULTURE RESULTS   Recent Results (from the past 240 hour(s))  SARS CORONAVIRUS 2 (TAT 6-24 HRS) Nasopharyngeal Nasopharyngeal Swab     Status: None   Collection Time: 07/20/19  8:09 PM   Specimen: Nasopharyngeal Swab  Result Value Ref Range Status   SARS Coronavirus 2 NEGATIVE NEGATIVE Final    Comment: (NOTE) SARS-CoV-2 target nucleic acids are NOT DETECTED. The SARS-CoV-2 RNA is generally detectable in upper and lower respiratory specimens during the acute phase  of infection. Negative results do not preclude SARS-CoV-2 infection, do not rule out co-infections with other pathogens, and should not be used as the sole basis for treatment or other patient management decisions. Negative results must be combined with clinical observations, patient history, and epidemiological information. The expected result is Negative. Fact Sheet for Patients: HairSlick.no Fact Sheet for Healthcare Providers: quierodirigir.com This test is not yet approved or cleared by the [place] FDA and  has been authorized for detection and/or diagnosis of SARS-CoV-2 by FDA under an Emergency Use Authorization (EUA). This EUA will remain  in effect (meaning this test can be used) for the duration of the COVID-19 declaration under Section 56 4(b)(1) of the Act, 21 U.S.C. section 360bbb-3(b)(1), unless the authorization is terminated or revoked sooner. Performed at [facility], [address], [place], [place] [address]   Culture, blood (x 2)     Status: None (Preliminary result)   Collection Time: 07/21/19  7:37 PM   Specimen: BLOOD  Result Value Ref Range Status   Specimen Description BLOOD LEFT ANTECUBITAL  Final   Special Requests   Final    BOTTLES DRAWN AEROBIC AND ANAEROBIC Blood Culture adequate volume   Culture   Final    NO GROWTH 3 DAYS Performed at [facility], [address] [place], [place] [address]    Report Status PENDING  Incomplete  Culture, blood (x 2)     Status: None (Preliminary result)   Collection Time: 07/21/19  7:38 PM   Specimen: BLOOD  Result Value  Ref Range Status   Specimen Description BLOOD BLOOD LEFT HAND  Final   Special Requests   Final    BOTTLES DRAWN AEROBIC AND ANAEROBIC Blood Culture adequate volume   Culture   Final    NO GROWTH 3 DAYS Performed at [facility], [address], [place], [place] [address]    Report Status PENDING   Incomplete  Urine culture     Status: None   Collection Time: 07/22/19  6:00 AM   Specimen: Urine, Random  Result Value Ref Range Status   Specimen Description   Final    URINE, RANDOM Performed at [facility], [address] [place], [place] [address]    Special Requests   Final    NONE Performed at [facility], [address] [place], [place] [address]    Culture   Final    NO GROWTH Performed at [facility], [place] [address], [place], [place] [address]    Report Status 07/23/2019 FINAL  Final  MRSA PCR Screening     Status: None   Collection Time: 07/22/19 12:07 PM   Specimen: Nasal Mucosa; Nasopharyngeal  Result Value Ref Range Status   MRSA by PCR NEGATIVE NEGATIVE Final    Comment:        The GeneXpert MRSA Assay (FDA approved for NASAL specimens only), is one component of a comprehensive MRSA colonization surveillance program. It is not intended to diagnose MRSA infection nor to guide or monitor treatment for MRSA infections. Performed at [facility], [address] [place], [place] [address]              ASSESSMENT AND PLAN SYNOPSIS  COPD EXACERBATION -continue  steroids as prescribed -continue NEB THERAPY as needed   NEUROLOGY Mental status improved   ELECTROLYTES -follow labs as needed -replace as needed -pharmacy consultation and following     DVT/GI PRX ordered TRANSFUSIONS AS NEEDED MONITOR FSBS ASSESS the need for LABS as needed   Mentation improved COPD stable DNR/DNI  Vital signs reviewed, ICU needs resolved  Will sign off at this time. No further recommendations at this time.  Please call [phone] for further questions. Thank you.    Corrin Parker, M.D.  [facility]  Medical Director [place] Director [facility] Cardio-P[facility] Department

## 2019-07-24 NOTE — Telephone Encounter (Signed)
Spoke to [employer] sister who answered phone and received update + let her know have reviewed hospital notes and see new changes made.  She stated appreciation for call.

## 2019-07-24 NOTE — Progress Notes (Signed)
Patient continued on nasal cannula this shift. Patient did report some shortness of breath, especially after coughing spells. Patient also tachypneic and tachycardiac, improved after prn pain/spasm medication (patient reported pain in rib cage area, approximately at location of diaphragm, particularly after coughing spells) and scheduled librium doses. Patient alert and oriented, reports he remembers having hallucinations this admission. Up to chair for most of shift. 1 assist to chair with some weakness and unstable gait noted with this RN during transfer. Foley catheter removed and patient voided several times into the urinal during shift.

## 2019-07-24 NOTE — Telephone Encounter (Signed)
Copied from CRM [id number]. Topic: General - Other >> Jul 24, 2019 10:46 AM Angela Nevin wrote: Patient requesting to speak with [facility] regarding latest hospital visit. He states that he was instructed to contact her to let her know how he is doing.

## 2019-07-25 ENCOUNTER — Inpatient Hospital Stay: Payer: Medicare Other

## 2019-07-25 LAB — GLUCOSE, CAPILLARY
Glucose-Capillary: 121 mg/dL — ABNORMAL HIGH (ref 70–99)
Glucose-Capillary: 145 mg/dL — ABNORMAL HIGH (ref 70–99)
Glucose-Capillary: 170 mg/dL — ABNORMAL HIGH (ref 70–99)
Glucose-Capillary: 183 mg/dL — ABNORMAL HIGH (ref 70–99)
Glucose-Capillary: 74 mg/dL (ref 70–99)
Glucose-Capillary: 83 mg/dL (ref 70–99)

## 2019-07-25 MED ORDER — TRAZODONE HCL 50 MG PO TABS
50.0000 mg | ORAL_TABLET | Freq: Every day | ORAL | Status: DC
  Administered 2019-07-25: 50 mg via ORAL
  Filled 2019-07-25: qty 1

## 2019-07-25 NOTE — Progress Notes (Addendum)
PROGRESS NOTE  Austin Blevins HKV:[id number] DOB: 1960/11/07 DOA: 07/20/2019 PCP: [name], NP  Brief History    Austin Blevins is a 59 y.o. male with medical history significant for hydrocephalus with known VP shunt, severe COPD with chronic respiratory failure on home O2 at 3 L, anxiety and restless leg syndrome who presented to the emergency room with severe respiratory distress and complaint of left  lateral chest pain and mid to lower back pain. Symptoms have been ongoing for 4 days. Pain is worse with movement and coughing and is of moderate intensity described as sharp and nonradiating. He has no associated nausea vomiting or diaphoresis. Shortness of breath worsens the chest pain.  He denies a fall. He denies fever, chills, abdominal pain, nausea vomiting or change in bowel movement.  He saw his pulmonologist a few days prior for follow-up and received a prescription for steroid taper pack and tramadol for the back pain.  On arrival to the emergency room, per ED provider he was initially very tachypneic at 30, using accessory muscles to breathe. O2 sat maintained at 90% on his home flow rate at 3 L. He was also tachycardic at 134 but afebrile. BP 172/86. On his blood work, BNP 44 troponin less than 2, white cell count 12,500 with otherwise unremarkable blood work. EKG showed no acute ST-T wave changes. He had a CTA of his chest which was negative for PE but showed advanced emphysema and stable pulmonary nodules also showed a new T7 fracture. Patient was given DuoNeb treatments in the emergency room as well as IV steroids with significant improvement in his breathing. Hospitalist consulted for admission.  The patient has been admitted to a telemetry bed. He is receiving steroids, antibiotics and nebulizer treatments. He is also receiving pain control. Sinus tachycardia has been addressed with ativan.   On 07/21/2019 the patient had increased heart rate and increased oxygen requirements. CXR  demonstrated a small left pleural effusion and emphysema. No change from previous. EKG demonstrated sinus tachycardia which was addressed with ativan.   On 07/22/2019 his wife expressed to nursing that the patient's tremors were worse than usual and that his speech was less intelligible than normal. CT head was performed and was unchanged from previous. Neurology was consulted. They felt that the patient was delerious and recommended antipsychotic for sleep. The patient was transferred to the ICU and started on a precedex drip. This has been able to be weaned to off. Mental status is improved. IV fluid bolus has been given for hypotension on the afternoon of 07/23/2019.  The patient has been transitioned off of precedex to librium. He is calmer. Heart rate and RR are improved, although he is somewhat lethargic.  Consultants  . Neurology  Procedures  . None  Antibiotics   Anti-infectives (From admission, onward)   Start     Dose/Rate Route Frequency Ordered Stop   07/22/19 2200  vancomycin (VANCOREADY) IVPB 1250 mg/250 mL  Status:  Discontinued     1,250 mg 166.7 mL/hr over 90 Minutes Intravenous Every 24 hours 07/21/19 2119 07/22/19 1657   07/21/19 2200  piperacillin-tazobactam (ZOSYN) IVPB 3.375 g  Status:  Discontinued     3.375 g 12.5 mL/hr over 240 Minutes Intravenous Every 8 hours 07/21/19 2117 07/22/19 1657   07/21/19 2130  vancomycin (VANCOREADY) IVPB 1250 mg/250 mL     1,250 mg 166.7 mL/hr over 90 Minutes Intravenous  Once 07/21/19 2117 07/22/19 0120   07/20/19 1830  azithromycin (ZITHROMAX) 500  mg in sodium chloride 0.9 % 250 mL IVPB     500 mg 250 mL/hr over 60 Minutes Intravenous  Once 07/20/19 1824 07/20/19 1956     Subjective  The patient is now sitting up at bedside. Much less tremulous. Speech is thick. No new complaints. According to his daughter the patient is still seeing things that are not there.  Objective   Vitals:  Vitals:   07/25/19 1600 07/25/19 1700  BP:   113/80  Pulse: (!) 101   Resp:    Temp:    SpO2:     Exam:  Constitutional:  The patient is awake, alert, and oriented x 3. He remains tachypneic and tachycardic, but much improved. Speech is thick.  Respiratory:  . No increased work of breathing. [name] Kitchen Positive for rhonchi throughout as well as scattered wheezes. . No rales. . No tactile fremitus Cardiovascular:  . Rate is fast, but regular. . No murmurs, ectopy, or gallups. . No lateral PMI. No thrills. Abdomen:  . Abdomen is soft, non-tender, non-distended . Scaffoid . No hernias, masses, or organomegaly . Normoactive bowel sounds.  Musculoskeletal:  . No cyanosis, clubbing, or edema . Cachectic Skin:  . No rashes, lesions, ulcers . palpation of skin: no induration or nodules Neurologic:  . Tremulous. Pt is moving all extremities. Psychiatric:  . Calm . Visual hallucinations. According to daughter these began prior to admission.  I have personally reviewed the following:   Today's Data  . Vitals, CBC, BMP  Imaging  . CXR . CTA chest  EKG: Sinus tachycardia Tele: Sinus tachycardia  Scheduled Meds: . budesonide (PULMICORT) nebulizer solution  0.5 mg Nebulization BID  . busPIRone  10 mg Oral BID  . chlordiazePOXIDE  25 mg Oral TID  . Chlorhexidine Gluconate Cloth  6 each Topical Daily  . enoxaparin (LOVENOX) injection  40 mg Subcutaneous Q24H  . feeding supplement (ENSURE ENLIVE)  237 mL Oral BID BM  . folic acid  1 mg Oral Daily  . insulin aspart  0-9 Units Subcutaneous TID WC  . multivitamin with minerals  1 tablet Oral Daily  . pantoprazole  40 mg Oral Daily  . predniSONE  10 mg Oral Q breakfast  . QUEtiapine  25 mg Oral QHS  . QUEtiapine  50 mg Oral Daily  . rOPINIRole  1 mg Oral q AM  . rOPINIRole  2 mg Oral QHS  . sertraline  150 mg Oral Daily  . thiamine  100 mg Oral Daily  . tiotropium  18 mcg Inhalation Daily  . traZODone  50 mg Oral QHS   Continuous Infusions: . lactated ringers 75 mL/hr at  07/25/19 0426    Principal Problem:   COPD with acute exacerbation (HCC) Active Problems:   Chronic respiratory failure with hypoxia (HCC)   Pulmonary nodules   Chest pain at rest   Thoracic vertebral fracture (HCC)   COPD exacerbation (HCC)   LOS: 4 days    A & P   Acute on chronic respiratory failure with hypoxia (HCC): The patient uses O2 at 3L by [place] at home chronically. He is currently saturating at 93% on 4L.   COPD with acute exacerbation: The patients CXR and CT document his severe COPD and emphysema. At home his pulmonologist had treated the patient with a steroid taper and pain meds. As inpatient the patient is being treated with IV steroids, azithromycin, mucolytics, and nebulizer treatment. He is currently saturating in the high 90's on his home O2 requirements.  Acute Bronchitis: Resulting in COPD exacerbation. Steroids, nebs, mucolytics and azithromycin. Wean steroids.  Delerium: Will start bedtime seroquel as per neurology. CT head was performed and was unchanged from previous. Neurology was consulted. They felt that the patient was delerious and recommended antipsychotic for sleep. The patient was transferred to the ICU and started on a precedex drip. This has been able to be weaned to off. Mental status is improved, although the patient remains tachypneic and tachycardic. Librium 10 mg q 8 hours has been started and seems to help, but not enough. Will increase Librium to 25 mg q 8 hours. I believe that the patient's delirium is likely due to benzodiazepine withdrawal. Monitor in ICU until vitals improve.  Visual hallucinations: Likely due to a medication. Home medications that may be to blame include Hycodan, requip, Zoloft, or buspar. Also takes unknown quantity of ativan and medications that he gets from his friends occasionally. Consider consult psychiatry in the morning.  Hypotension: Resolved.  Pulmonary nodules: CTA chest demonstrated unchanged bilateral pulmonary  nodules with the largest in the right lower lobe. Follow is recommended as per previous recommendations per radiology.  Abdominal distention: Abdominal film raised a question of perigastric pneumoperitoniem. CT abdomen was performed which showed no obstruction, moderate stool load throughout the colonic confines, and no pneumoperitoneum.   Chest pain: Primarily occurs with activity, but especially with cough. Pain is minimal while at rest. EKG demonstrated no ischemic changes, and troponins were negative. The patient's pleuritic chest pain is likely due to chest wall soreness from cough, as well as pain from acute thoracic vertebral fracture. Pt denies recent fall or injury. He also has pain with activity probably due to the vertebral body fracture. He is receiving hycodan to suppress his cough as well as as needed oxycodone for pain.   Acute T7 vertebral body fracture: Most certainly largely to blame for the patient's discomfort, but also possibly a consequence of the patient's cough due to acute bronchitis and his chronic on and off steroid use for severe COPD. Doubt that this fracture and the patient's osteopenic spine would be appropriate for kyphoplasty. Will add toradol to pain control and consult PT/OT for mobility.  Chronic tremors: Likely due to the patient's history of hydrocephalus. Chronic. Worse today. Neurology does not see anything new in CT head.    Anxiety: Continue BuSpar   Hydrocephalus with history of VP shunt: Likely cause of the patient's chronic and severe tremors. No acute concerns at this time related to the hydrocephalus itself.  Severe protein calorie malnutrition: Nutrition consulted. Supplements ordered.   I have seen and examined this patient myself. I have spent 38 minutes in his evaluation and care.   DVT prophylaxis: Lovenox  Code Status: full code  Family Communication:  none  Disposition Plan: Patient is from home. Anticipated discharge would be back to  previous home environment when cough, pain and tremor are under control.  Walter Grima, DO [facility] Direct contact: see [email or web address]  7PM-7AM contact night coverage as above 07/25/2019, 5:51 PM  LOS: 0 days

## 2019-07-26 DIAGNOSIS — R441 Visual hallucinations: Secondary | ICD-10-CM

## 2019-07-26 LAB — BASIC METABOLIC PANEL
Anion gap: 6 (ref 5–15)
BUN: 23 mg/dL — ABNORMAL HIGH (ref 6–20)
CO2: 32 mmol/L (ref 22–32)
Calcium: 8.4 mg/dL — ABNORMAL LOW (ref 8.9–10.3)
Chloride: 102 mmol/L (ref 98–111)
Creatinine, Ser: 1.01 mg/dL (ref 0.61–1.24)
GFR calc Af Amer: >60 mL/min (ref >60)
GFR calc non Af Amer: >60 mL/min (ref >60)
Glucose, Bld: 116 mg/dL — ABNORMAL HIGH (ref 70–99)
Potassium: 3.9 mmol/L (ref 3.5–5.1)
Sodium: 140 mmol/L (ref 135–145)

## 2019-07-26 LAB — CBC
HCT: 37 % — ABNORMAL LOW (ref 39.0–52.0)
Hemoglobin: 11.7 g/dL — ABNORMAL LOW (ref 13.0–17.0)
MCH: 30.7 pg (ref 26.0–34.0)
MCHC: 31.6 g/dL (ref 30.0–36.0)
MCV: 97.1 fL (ref 80.0–100.0)
Platelets: 182 10*3/uL (ref 150–400)
RBC: 3.81 MIL/uL — ABNORMAL LOW (ref 4.22–5.81)
RDW: 12.2 % (ref 11.5–15.5)
WBC: 8.2 10*3/uL (ref 4.0–10.5)
nRBC: 0 % (ref 0.0–0.2)

## 2019-07-26 LAB — CULTURE, BLOOD (ROUTINE X 2)
Culture: NO GROWTH
Culture: NO GROWTH
Special Requests: ADEQUATE
Special Requests: ADEQUATE

## 2019-07-26 LAB — GLUCOSE, CAPILLARY
Glucose-Capillary: 101 mg/dL — ABNORMAL HIGH (ref 70–99)
Glucose-Capillary: 112 mg/dL — ABNORMAL HIGH (ref 70–99)
Glucose-Capillary: 174 mg/dL — ABNORMAL HIGH (ref 70–99)
Glucose-Capillary: 182 mg/dL — ABNORMAL HIGH (ref 70–99)
Glucose-Capillary: 219 mg/dL — ABNORMAL HIGH (ref 70–99)
Glucose-Capillary: 86 mg/dL (ref 70–99)

## 2019-07-26 MED ORDER — QUETIAPINE FUMARATE 25 MG PO TABS
25.0000 mg | ORAL_TABLET | Freq: Every day | ORAL | Status: DC
  Administered 2019-07-28 – 2019-08-02 (×6): 25 mg via ORAL
  Filled 2019-07-26 (×6): qty 1

## 2019-07-26 MED ORDER — LIDOCAINE 5 % EX PTCH
1.0000 | MEDICATED_PATCH | CUTANEOUS | Status: DC
  Administered 2019-07-26 – 2019-08-02 (×8): 1 via TRANSDERMAL
  Filled 2019-07-26 (×8): qty 1

## 2019-07-26 MED ORDER — DICLOFENAC SODIUM 1 % EX GEL
2.0000 g | Freq: Four times a day (QID) | CUTANEOUS | Status: DC
  Administered 2019-07-26 – 2019-08-02 (×24): 2 g via TOPICAL
  Filled 2019-07-26 (×2): qty 100

## 2019-07-26 MED ORDER — QUETIAPINE FUMARATE 25 MG PO TABS
50.0000 mg | ORAL_TABLET | Freq: Every day | ORAL | Status: DC
  Administered 2019-07-26 – 2019-08-01 (×7): 50 mg via ORAL
  Filled 2019-07-26 (×7): qty 2

## 2019-07-26 MED ORDER — CHLORDIAZEPOXIDE HCL 25 MG PO CAPS
25.0000 mg | ORAL_CAPSULE | Freq: Two times a day (BID) | ORAL | Status: DC
  Administered 2019-07-26 – 2019-07-28 (×3): 25 mg via ORAL
  Filled 2019-07-26 (×3): qty 1

## 2019-07-26 NOTE — Progress Notes (Signed)
Nutrition Follow-up  DOCUMENTATION CODES:   Severe malnutrition in context of chronic illness  INTERVENTION:  Continue Ensure Enlive po BID, each supplement provides 350 kcal and 20 grams of protein.  Continue Magic cup TID with meals, each supplement provides 290 kcal and 9 grams of protein.  Continue MVI daily.  NUTRITION DIAGNOSIS:   Severe Malnutrition related to chronic illness(COPD) as evidenced by moderate fat depletion, severe fat depletion, moderate muscle depletion, severe muscle depletion.  Ongoing.  GOAL:   Patient will meet greater than or equal to 90% of their needs  Progressing.  MONITOR:   PO intake, Supplement acceptance, Labs, Weight trends, Skin, I & O's  REASON FOR ASSESSMENT:   Consult Assessment of nutrition requirement/status  ASSESSMENT:   59 y.o. male with history of hydrocephalus status post VP shunt, severe COPD on baseline 3 L nasal cannula, chronic pancreatitis, restless leg who presents to the emergency department for shortness of breath, cough, bilateral posteriolateral mid thoracic chest and back pain.  Met with patient at bedside. He reports his appetite is good and he is hungry. He reports he is eating at least 50% of his meals and up to 100% of some meals. He is drinking Ensure and enjoys them. He is also eating his [employer].  Medications reviewed and include: folic acid 1 mg daily, Novolog 0-9 units TID, MVI daily, pantoprazole, prednisone 10 mg daily, thiamine 100 mg daily, LR at 75 mL/hr.  Labs reviewed: CBG 86-219, BUN 23.  Diet Order:   Diet Order            DIET DYS 3 Room service appropriate? Yes; Fluid consistency: Thin  Diet effective now             EDUCATION NEEDS:   Education needs have been addressed  Skin:  Skin Assessment: Reviewed RN Assessment(ecchymosis)  Last BM:  07/25/2019 per chart  Height:   Ht Readings from Last 1 Encounters:  07/20/19 _0  (1.702 m)   Weight:   Wt Readings from Last 1  Encounters:  07/20/19 55.3 kg   Ideal Body Weight:  67 kg  BMI:  Body mass index is 19.11 kg/m.  Estimated Nutritional Needs:   Kcal:  1700-2000kcal/day  Protein:  85-100g/day  Fluid:  >1.7L/day  Jacklynn Barnacle, MS, RD, LDN Pager number available on Amion

## 2019-07-26 NOTE — Progress Notes (Signed)
PROGRESS NOTE    Austin Blevins  OAC:[id number] DOB: 03/23/1961 DOA: 07/20/2019 PCP: [name], NP  Brief Narrative:  Austin Blevins Austin Blevins 59 y.o.malewith medical history significant forhydrocephalus with known VP shunt, severe COPD with chronic respiratory failure on home O2 at 3 L, anxiety and restless leg syndrome who presented to the emergency room with severe respiratory distress and complaint ofleftlateral chest pain and mid to lowerback pain. Symptoms have been ongoing for 4 days. Pain is worse with movement and coughing and is of moderate intensity described as sharp and nonradiating. He has no associated nausea vomiting or diaphoresis. Shortness of breath worsensthechest pain. He denies Austin Blevins fall. He denies fever, chills, abdominal pain, nausea vomiting or change in bowel movement.He saw his pulmonologist Austin Blevins few days prior for follow-up and received Austin Blevins prescription for steroid taper pack and tramadol for the back pain.  On arrival to the emergency room, per ED providerhe was initially very tachypneic at 30, using accessory muscles to breathe. O2 sat maintained at 90% on his home flow rate at 3 L. He was also tachycardic at 134 but afebrile. BP 172/86. On his blood work, BNP 44 troponin less than 2, white cell count 12,500 with otherwise unremarkable blood work. EKG showed no acute ST-T wave changes. He had Austin Blevins CTA of his chest which was negative for PE but showed advanced emphysema and stable pulmonary nodules also showed Austin Blevins new T7 fracture. Patient was given DuoNeb treatments in the emergency room as well as IV steroids with significant improvement in his breathing. Hospitalist consulted for admission.  The patient has been admitted to Austin Blevins telemetry bed. He is receiving steroids, antibiotics and nebulizer treatments. He is also receiving pain control. Sinus tachycardia has been addressed with ativan.   On 07/21/2019 the patient had increased heart rate and increased oxygen  requirements. CXR demonstrated Austin Blevins small left pleural effusion and emphysema. No change from previous. EKG demonstrated sinus tachycardia which was addressed with ativan.   On 07/22/2019 his wife expressed to nursing that the patient's tremors were worse than usual and that his speech was less intelligible than normal. CT head was performed and was unchanged from previous. Neurology was consulted. They felt that the patient was delerious and recommended antipsychotic for sleep. The patient was transferred to the ICU and started on Austin Blevins drip. This has been able to be weaned to off. Mental status is improved. IV fluid bolus has been given for hypotension on the afternoon of 07/23/2019.  The patient has been transitioned off of Blevins to librium. He is calmer. Heart rate and RR are improved, although he is somewhat lethargic.   Assessment & Plan:   Principal Problem:   COPD with acute exacerbation (Austin Blevins) Active Problems:   Chronic respiratory failure with hypoxia (HCC)   Pulmonary nodules   Chest pain at rest   Thoracic vertebral fracture (HCC)   COPD exacerbation (HCC)  Acute on chronic respiratory failure with hypoxia (HCC)  COPD Exacerbation  COPD on 2 - 3 L at baseline (on 2 L at rest and 3 L with activity): The patient uses O2 at 3L by Austin Blevins at home chronically. He is currently saturating at 95% on 3L.  No wheezing at the present.  He'd been treated with steroids and pain meds by his pulmonologist prior to admission.  - he's no longer on abx, but is back on his home steroid dose  - IS, flutter   - continue to monitor seems to be improved  from resp standpoint  Delerium: Will start bedtime seroquel as per neurology. CT head was performed and was unchanged from previous. Neurology was consulted. They felt that the patient was delerious and recommended antipsychotic for sleep.   - improved, but persisntent per discussion with sister  - psychiatry has seen him, discontinued trazodone and  buspar - seroquel adjusted  - s/p Blevins, now on librium 25 mg TID.  Suggestion of benzo withdrawal from previous note, but I don't see this in home medlist or PMP aware.  Apparently takes meds from friends at times (unknown quantity - ? Valium, percocets). Will start to taper librium.  Will discuss with sister (sounds like he's not taking benzos that consistently?).   Visual hallucinations: Likely due to Austin Blevins medication, ? Benzo withdrawal. Home medications that may be to blame include Hycodan, requip, Zoloft, or buspar. Also takes unknown quantity of ativan and medications that he gets from his friends occasionally.   - appreciate psych eval  Hypotension: Resolved.  Pulmonary nodules: CTA chest demonstrated unchanged bilateral pulmonary nodules with the largest in the right lower lobe. Follow is recommended as per previous recommendations per radiology.  Abdominal distention: Abdominal film raised [name] question of perigastric pneumoperitoniem. CT abdomen was performed which showed no obstruction, moderate stool load throughout the colonic confines, and no pneumoperitoneum.   Chest pain: Seems to be primarily with cough.  EKG without ischemic changes at presentation and negative troponins x 4.    - continue hycodan for cough  - voltaren, lidocaine patch    Acute T7 vertebral body fracture: Most certainly largely to blame for the patient's discomfort, but also possibly [name] consequence of the patient's cough due to acute bronchitis and his chronic on and off steroid use for severe COPD. Doubt that this fracture and the patient's osteopenic spine would be appropriate for kyphoplasty. Will add toradol to pain control and consult PT/OT for mobility.  Chronic tremors:  Chronic. Worse today. Neurology does not see anything new in CT head.  Follow outpatient with neurology.  Anxiety: Continue BuSpar  Hydrocephalus with history of VP shunt: No acute concerns at this time related to the hydrocephalus  itself.  Severe protein calorie malnutrition: Nutrition consulted. Supplements ordered.   DVT prophylaxis: lovenox Code Status: DNR Family Communication: none currently at bedside - sister  Disposition Plan:  . Patient came from: home            . Anticipated d/c place: SNF . Barriers to d/c OR conditions which need to be met to effect Taiylor Virden safe d/c: improvement in pain control, respiratory status, placement for SNF   Consultants:   Neurology  Critical care  Procedures:  none  Antimicrobials: Anti-infectives (From admission, onward)   Start     Dose/Rate Route Frequency Ordered Stop   07/22/19 2200  vancomycin (VANCOREADY) IVPB 1250 mg/250 mL  Status:  Discontinued     1,250 mg 166.7 mL/hr over 90 Minutes Intravenous Every 24 hours 07/21/19 2119 07/22/19 1657   07/21/19 2200  piperacillin-tazobactam (ZOSYN) IVPB 3.375 g  Status:  Discontinued     3.375 g 12.5 mL/hr over 240 Minutes Intravenous Every 8 hours 07/21/19 2117 07/22/19 1657   07/21/19 2130  vancomycin (VANCOREADY) IVPB 1250 mg/250 mL     1,250 mg 166.7 mL/hr over 90 Minutes Intravenous  Once 07/21/19 2117 07/22/19 0120   07/20/19 1830  azithromycin (ZITHROMAX) 500 mg in sodium chloride 0.9 % 250 mL IVPB     500 mg 250 mL/hr over 60  Minutes Intravenous  Once 07/20/19 1824 07/20/19 1956     Subjective: Pain with cough, across abdomen In back as well  Objective: Vitals:   07/26/19 0400 07/26/19 0700 07/26/19 0800 07/26/19 0900  BP: 110/68 119/72 (!) 136/94 126/84  Pulse: 85 84 99 87  Resp: 19 (!) 26 (!) 31 (!) 28  Temp: 98.3 F (36.8 C)     TempSrc: Oral     SpO2: 94% 95% 91% 95%  Weight:      Height:        Intake/Output Summary (Last 24 hours) at 07/26/2019 1136 Last data filed at 07/26/2019 0800 Gross per 24 hour  Intake --  Output 1100 ml  Net -1100 ml   Filed Weights   07/20/19 1652  Weight: 55.3 kg    Examination:  General exam: Appears calm and comfortable  Respiratory system: Clear  to auscultation. Respiratory effort normal. Cardiovascular system: S1 & S2 heard, RRR.  Gastrointestinal system: Abdomen is nondistended, soft and nontender. Central nervous system: Alert and oriented x3. No focal neurological deficits. Extremities: moving all extremities Skin: No rashes, lesions or ulcers Psychiatry: Judgement and insight appear normal. Mood & affect appropriate.     Data Reviewed: I have personally reviewed following labs and imaging studies  CBC: Recent Labs  Lab 07/20/19 1650 07/20/19 1922 07/21/19 1935 07/21/19 1935 07/22/19 0437 07/22/19 1903 07/23/19 0519 07/24/19 0436 07/26/19 0407  WBC 12.5*   < > 11.8*   < > 10.2 7.9 8.3 7.9 8.2  NEUTROABS 11.9*  --  10.2*  --   --   --   --  6.1  --   HGB 14.9   < > 13.0   < > 12.1* 13.0 12.1* 11.9* 11.7*  HCT 46.7   < > 41.5   < > 38.9* 41.0 37.3* 38.0* 37.0*  MCV 97.5   < > 97.9   < > 100.0 98.6 96.4 98.4 97.1  PLT 315   < > 240   < > 194 198 171 182 182   < > = values in this interval not displayed.   Basic Metabolic Panel: Recent Labs  Lab 07/22/19 0437 07/22/19 1903 07/23/19 0519 07/24/19 0436 07/26/19 0407  NA 142 139 139 140 140  K 3.9 4.9 4.2 3.9 3.9  CL 102 101 97* 103 102  CO2 32 32 33* 33* 32  GLUCOSE 89 71 122* 77 116*  BUN 26* 26* 22* 21* 23*  CREATININE 1.03 1.05 0.92 1.01 1.01  CALCIUM 8.8* 8.9 8.4* 8.6* 8.4*  MG  --   --  2.6* 2.3  --   PHOS  --   --  3.3 4.0  --    GFR: Estimated Creatinine Clearance: 63.1 mL/min (by C-G formula based on SCr of 1.01 mg/dL). Liver Function Tests: Recent Labs  Lab 07/20/19 1650 07/21/19 1935 07/22/19 0437 07/22/19 1903  AST '19 29 16 19  ' ALT '14 19 19 21  ' ALKPHOS 94 73 62 69  BILITOT 0.5 0.6 0.7 0.4  PROT 7.5 6.4* 5.7* 6.4*  ALBUMIN 3.9 3.4* 3.1* 3.5   Recent Labs  Lab 07/20/19 1650  LIPASE 108*   No results for input(s): AMMONIA in the last 168 hours. Coagulation Profile: Recent Labs  Lab 07/21/19 1935  INR 0.9   Cardiac  Enzymes: No results for input(s): CKTOTAL, CKMB, CKMBINDEX, TROPONINI in the last 168 hours. BNP (last 3 results) No results for input(s): PROBNP in the last 8760 hours. HbA1C: No results for input(s):  HGBA1C in the last 72 hours. CBG: Recent Labs  Lab 07/25/19 1948 07/26/19 0023 07/26/19 0414 07/26/19 0731 07/26/19 1107  GLUCAP 170* 219* 112* 86 101*   Lipid Profile: No results for input(s): CHOL, HDL, LDLCALC, TRIG, CHOLHDL, LDLDIRECT in the last 72 hours. Thyroid Function Tests: No results for input(s): TSH, T4TOTAL, FREET4, T3FREE, THYROIDAB in the last 72 hours. Anemia Panel: No results for input(s): VITAMINB12, FOLATE, FERRITIN, TIBC, IRON, RETICCTPCT in the last 72 hours. Sepsis Labs: Recent Labs  Lab 07/21/19 1815 07/21/19 1935 07/21/19 2050 07/22/19 1618 07/22/19 1856  PROCALCITON  --  0.11  --   --   --   LATICACIDVEN 2.6*  --  2.0* 3.8* 1.5    Recent Results (from the past 240 hour(s))  SARS CORONAVIRUS 2 (TAT 6-24 HRS) Nasopharyngeal Nasopharyngeal Swab     Status: None   Collection Time: 07/20/19  8:09 PM   Specimen: Nasopharyngeal Swab  Result Value Ref Range Status   SARS Coronavirus 2 NEGATIVE NEGATIVE Final    Comment: (NOTE) SARS-CoV-2 target nucleic acids are NOT DETECTED. The SARS-CoV-2 RNA is generally detectable in upper and lower respiratory specimens during the acute phase of infection. Negative results do not preclude SARS-CoV-2 infection, do not rule out co-infections with other pathogens, and should not be used as the sole basis for treatment or other patient management decisions. Negative results must be combined with clinical observations, patient history, and epidemiological information. The expected result is Negative. Fact Sheet for Patients: SugarRoll.be Fact Sheet for Healthcare Providers: https://www.woods-mathews.com/ This test is not yet approved or cleared by the [place] FDA and   has been authorized for detection and/or diagnosis of SARS-CoV-2 by FDA under an Emergency Use Authorization (EUA). This EUA will remain  in effect (meaning this test can be used) for the duration of the COVID-19 declaration under Section 56 4(b)(1) of the Act, 21 U.S.C. section 360bbb-3(b)(1), unless the authorization is terminated or revoked sooner. Performed at [facility], [place] [address], [place], [place] [address]   Culture, blood (x 2)     Status: None   Collection Time: 07/21/19  7:37 PM   Specimen: BLOOD  Result Value Ref Range Status   Specimen Description BLOOD LEFT ANTECUBITAL  Final   Special Requests   Final    BOTTLES DRAWN AEROBIC AND ANAEROBIC Blood Culture adequate volume   Culture   Final    NO GROWTH 5 DAYS Performed at [facility], [address], [place], [place] [address]    Report Status 07/26/2019 FINAL  Final  Culture, blood (x 2)     Status: None   Collection Time: 07/21/19  7:38 PM   Specimen: BLOOD  Result Value Ref Range Status   Specimen Description BLOOD BLOOD LEFT HAND  Final   Special Requests   Final    BOTTLES DRAWN AEROBIC AND ANAEROBIC Blood Culture adequate volume   Culture   Final    NO GROWTH 5 DAYS Performed at [facility], [address], [place], [place] [address]    Report Status 07/26/2019 FINAL  Final  Urine culture     Status: None   Collection Time: 07/22/19  6:00 AM   Specimen: Urine, Random  Result Value Ref Range Status   Specimen Description   Final    URINE, RANDOM Performed at [facility], [address], [place], [place] [address]    Special Requests   Final    NONE Performed at [facility], [address] [place], [place] [address]    Culture   Final    NO GROWTH Performed at [facility], [place] [address] [place], [place] [address]    Report Status 07/23/2019 FINAL  Final  MRSA PCR Screening     Status: None   Collection Time: 07/22/19 12:07 PM    Specimen: Nasal Mucosa; Nasopharyngeal  Result Value Ref Range Status   MRSA by PCR NEGATIVE NEGATIVE Final    Comment:        The GeneXpert MRSA Assay (FDA approved for NASAL specimens only), is one component of Keyston Ardolino comprehensive MRSA colonization surveillance program. It is not intended to diagnose MRSA infection nor to guide or monitor treatment for MRSA infections. Performed at [facility], [address], [place], [place] [address]          Radiology Studies: [facility] Chest [place] 1 View  Result Date: 07/25/2019 CLINICAL DATA:  Cough. EXAM: PORTABLE CHEST 1 VIEW COMPARISON:  July 21, 2019. FINDINGS: The heart size and mediastinal contours are within normal limits. No pneumothorax is noted. Stable mild probably loculated left pleural effusion is noted with associated atelectasis or infiltrate. Mildly increased right basilar atelectasis or infiltrate is noted. The visualized skeletal structures are unremarkable. IMPRESSION: Stable mild probably loculated left pleural effusion with associated atelectasis or infiltrate. Mildly increased right basilar atelectasis or infiltrate. Electronically Signed   By: Marijo Conception M.D.   On: 07/25/2019 09:54        Scheduled Meds: . budesonide (PULMICORT) nebulizer solution  0.5 mg Nebulization BID  . busPIRone  10 mg Oral BID  . chlordiazePOXIDE  25 mg Oral TID  . Chlorhexidine Gluconate Cloth  6 each Topical Daily  . diclofenac Sodium  2 g Topical QID  . enoxaparin (LOVENOX) injection  40 mg Subcutaneous Q24H  . feeding supplement (ENSURE ENLIVE)  237 mL Oral BID BM  . folic acid  1 mg Oral Daily  . insulin aspart  0-9 Units Subcutaneous TID WC  . lidocaine  1 patch Transdermal Q24H  . multivitamin with minerals  1 tablet Oral Daily  . pantoprazole  40 mg Oral Daily  . predniSONE  10 mg Oral Q breakfast  . QUEtiapine  25 mg Oral QHS  . QUEtiapine  50 mg Oral Daily  . rOPINIRole  1 mg Oral q AM  . rOPINIRole  2 mg Oral QHS  .  sertraline  150 mg Oral Daily  . thiamine  100 mg Oral Daily  . tiotropium  18 mcg Inhalation Daily  . traZODone  50 mg Oral QHS   Continuous Infusions: . lactated ringers 75 mL/hr at 07/25/19 0426     LOS: 5 days    Time spent: over 30 min    Fayrene Helper, MD [facility]   To contact the attending provider between 7A-7P or the covering provider during after hours 7P-7A, please log into the web site [email or web address] and access using universal [place] password for that web site. If you do not have the password, please call the hospital operator.  07/26/2019, 11:36 AM

## 2019-07-26 NOTE — Consult Note (Signed)
[facility] Face-to-Face Psychiatry Consult   Reason for Consult: Visual hallucinations Referring Physician: Dr. Lowell Guitar Patient Identification: ALECK LOCKLIN MRN:  [id number] Principal Diagnosis: COPD with acute exacerbation [facility]) Diagnosis:  Principal Problem:   COPD with acute exacerbation (HCC) Active Problems:   Chronic respiratory failure with hypoxia [facility])   Pulmonary nodules   Chest pain at rest   Thoracic vertebral fracture (HCC)   COPD exacerbation (HCC)   Total Time spent with patient: 30 minutes  Subjective:   GLENNIE RODDA is a 59 y.o. male patient admitted with neurological issues.  Psychiatry was consulted for visual hallucinations.  HPI: Patient is a 59 year old male with neurological issues including VP shunt who psychiatry was consulted on due to visual hallucinations.  Patient reports experiencing visual hallucinations approximately several weeks ago where he saw a white speck move across the room.  Patient also endorses seeing a man by the side of his bed who ran and disappeared into the mirror.  Patient was not distressed by these hallucinations and reports they were in the context of multiple medical issues.  Patient was explained that it is possible he was delirious during these episodes as well as experiencing a side effect of his neurologic condition and medications.  Patient denies any longstanding psychiatric issues or problems with delusions hallucinations or psychosis.  Patient is agreeable to medication adjustments in order to simplify his regimen and decrease potential for interactions.  Past Psychiatric History: Patient reports medication added recently in the context of significant medical issues.  Denies lifelong psychiatric diagnoses.  Denies any previous psychiatric hospitalizations.  Risk to Self:  No Risk to Others:  No Prior Inpatient Therapy:  No Prior Outpatient Therapy:  No  Past Medical History:  Past Medical History:  Diagnosis Date  . Anxiety    . COPD (chronic obstructive pulmonary disease) (HCC)   . Depression   . Hydrocephalus [facility])     Past Surgical History:  Procedure Laterality Date  . BRAIN SURGERY  2003   VA shunt for hydrocephalus  . ventricular shunt     Family History:  Family History  Problem Relation Age of Onset  . Diabetes Mellitus II Mother   . CAD Father   . Hypertension Father   . Arthritis/Rheumatoid Sister   . Hyperlipidemia Sister   . Hypertension Sister   . Depression Sister   . Anxiety disorder Sister   . Diabetes Sister   . Heart disease Brother   . Other Brother        DDD  . Diabetes Sister   . Diabetes Sister   . Arthritis/Rheumatoid Brother   . Other Brother        DDD   Family Psychiatric  History: Denies Social History:  Social History   Substance and Sexual Activity  Alcohol Use No     Social History   Substance and Sexual Activity  Drug Use No    Social History   Socioeconomic History  . Marital status: Legally Separated    Spouse name: Not on file  . Number of children: 0  . Years of education: eigth grade  . Highest education level: 7th grade  Occupational History  . Not on file  Tobacco Use  . Smoking status: Current Every Day Smoker    Packs/day: 1.00    Years: 40.00    Pack years: 40.00    Types: Cigarettes  . Smokeless tobacco: Never Used  . Tobacco comment: Informed patient that we can give him materials  when he is ready to quit.  Substance and Sexual Activity  . Alcohol use: No  . Drug use: No  . Sexual activity: Not on file  Other Topics Concern  . Not on file  Social History Narrative   Lives with mom who pays for everything. He says he is secure for the first time in a while   Social Determinants of Health   Financial Resource Strain:   . Difficulty of Paying Living Expenses: Not on file  Food Insecurity:   . Worried About Programme researcher, broadcasting/film/videounning Out of Food in the Last Year: Not on file  . Ran Out of Food in the Last Year: Not on file  Transportation  Needs:   . Lack of Transportation (Medical): Not on file  . Lack of Transportation (Non-Medical): Not on file  Physical Activity:   . Days of Exercise per Week: Not on file  . Minutes of Exercise per Session: Not on file  Stress:   . Feeling of Stress : Not on file  Social Connections:   . Frequency of Communication with Friends and Family: Not on file  . Frequency of Social Gatherings with Friends and Family: Not on file  . Attends Religious Services: Not on file  . Active Member of Clubs or Organizations: Not on file  . Attends BankerClub or Organization Meetings: Not on file  . Marital Status: Not on file   Additional Social History:    Allergies:   Allergies  Allergen Reactions  . Acetaminophen Other (See Comments)    Syncope  . Percocet [Oxycodone-Acetaminophen]     Labs:  Results for orders placed or performed during the hospital encounter of 07/20/19 (from the past 48 hour(s))  Glucose, capillary     Status: Abnormal   Collection Time: 07/24/19  7:43 PM  Result Value Ref Range   Glucose-Capillary 221 (H) 70 - 99 mg/dL    Comment: Glucose reference range applies only to samples taken after fasting for at least 8 hours.  Glucose, capillary     Status: Abnormal   Collection Time: 07/25/19 12:01 AM  Result Value Ref Range   Glucose-Capillary 183 (H) 70 - 99 mg/dL    Comment: Glucose reference range applies only to samples taken after fasting for at least 8 hours.  Glucose, capillary     Status: None   Collection Time: 07/25/19  4:07 AM  Result Value Ref Range   Glucose-Capillary 83 70 - 99 mg/dL    Comment: Glucose reference range applies only to samples taken after fasting for at least 8 hours.  Glucose, capillary     Status: None   Collection Time: 07/25/19  7:37 AM  Result Value Ref Range   Glucose-Capillary 74 70 - 99 mg/dL    Comment: Glucose reference range applies only to samples taken after fasting for at least 8 hours.  Glucose, capillary     Status: Abnormal    Collection Time: 07/25/19 11:15 AM  Result Value Ref Range   Glucose-Capillary 121 (H) 70 - 99 mg/dL    Comment: Glucose reference range applies only to samples taken after fasting for at least 8 hours.  Glucose, capillary     Status: Abnormal   Collection Time: 07/25/19  3:55 PM  Result Value Ref Range   Glucose-Capillary 145 (H) 70 - 99 mg/dL    Comment: Glucose reference range applies only to samples taken after fasting for at least 8 hours.  Glucose, capillary     Status: Abnormal  Collection Time: 07/25/19  7:48 PM  Result Value Ref Range   Glucose-Capillary 170 (H) 70 - 99 mg/dL    Comment: Glucose reference range applies only to samples taken after fasting for at least 8 hours.  Glucose, capillary     Status: Abnormal   Collection Time: 07/26/19 12:23 AM  Result Value Ref Range   Glucose-Capillary 219 (H) 70 - 99 mg/dL    Comment: Glucose reference range applies only to samples taken after fasting for at least 8 hours.  CBC     Status: Abnormal   Collection Time: 07/26/19  4:07 AM  Result Value Ref Range   WBC 8.2 4.0 - 10.5 K/uL   RBC 3.81 (L) 4.22 - 5.81 MIL/uL   Hemoglobin 11.7 (L) 13.0 - 17.0 g/dL   HCT 37.0 (L) 39.0 - 52.0 %   MCV 97.1 80.0 - 100.0 fL   MCH 30.7 26.0 - 34.0 pg   MCHC 31.6 30.0 - 36.0 g/dL   RDW 12.2 11.5 - 15.5 %   Platelets 182 150 - 400 K/uL   nRBC 0.0 0.0 - 0.2 %    Comment: Performed at [facility], [address] [place], [place] [address]  Basic metabolic panel     Status: Abnormal   Collection Time: 07/26/19  4:07 AM  Result Value Ref Range   Sodium 140 135 - 145 mmol/L   Potassium 3.9 3.5 - 5.1 mmol/L   Chloride 102 98 - 111 mmol/L   CO2 32 22 - 32 mmol/L   Glucose, Bld 116 (H) 70 - 99 mg/dL    Comment: Glucose reference range applies only to samples taken after fasting for at least 8 hours.   BUN 23 (H) 6 - 20 mg/dL   Creatinine, Ser 1.01 0.61 - 1.24 mg/dL   Calcium 8.4 (L) 8.9 - 10.3 mg/dL   GFR calc non Af Amer >60 >60  mL/min   GFR calc Af Amer >60 >60 mL/min   Anion gap 6 5 - 15    Comment: Performed at [facility], [address] [place], [place] [address]  Glucose, capillary     Status: Abnormal   Collection Time: 07/26/19  4:14 AM  Result Value Ref Range   Glucose-Capillary 112 (H) 70 - 99 mg/dL    Comment: Glucose reference range applies only to samples taken after fasting for at least 8 hours.  Glucose, capillary     Status: None   Collection Time: 07/26/19  7:31 AM  Result Value Ref Range   Glucose-Capillary 86 70 - 99 mg/dL    Comment: Glucose reference range applies only to samples taken after fasting for at least 8 hours.  Glucose, capillary     Status: Abnormal   Collection Time: 07/26/19 11:07 AM  Result Value Ref Range   Glucose-Capillary 101 (H) 70 - 99 mg/dL    Comment: Glucose reference range applies only to samples taken after fasting for at least 8 hours.  Glucose, capillary     Status: Abnormal   Collection Time: 07/26/19  4:24 PM  Result Value Ref Range   Glucose-Capillary 174 (H) 70 - 99 mg/dL    Comment: Glucose reference range applies only to samples taken after fasting for at least 8 hours.    Current Facility-Administered Medications  Medication Dose Route Frequency Provider Last Rate Last Admin  . albuterol (PROVENTIL) (2.5 MG/3ML) 0.083% nebulizer solution 2.5 mg  2.5 mg Nebulization Q6H PRN Swayze, Ava, DO   2.5  mg at 07/21/19 2035  . budesonide (PULMICORT) nebulizer solution 0.5 mg  0.5 mg Nebulization BID Eugenie Norrie, NP   0.5 mg at 07/26/19 0756  . busPIRone (BUSPAR) tablet 10 mg  10 mg Oral BID Swayze, Ava, DO   10 mg at 07/26/19 0832  . chlordiazePOXIDE (LIBRIUM) capsule 25 mg  25 mg Oral BID Zigmund Daniel., MD      . Chlorhexidine Gluconate Cloth 2 % PADS 6 each  6 each Topical Daily Swayze, Ava, DO   6 each at 07/25/19 2120  . diazepam (VALIUM) injection 2.5-5 mg  2.5-5 mg Intravenous Q4H PRN Eugenie Norrie, NP   5 mg at 07/24/19 1457   . diclofenac Sodium (VOLTAREN) 1 % topical gel 2 g  2 g Topical QID Zigmund Daniel., MD   2 g at 07/26/19 1705  . enoxaparin (LOVENOX) injection 40 mg  40 mg Subcutaneous Q24H Swayze, Ava, DO   40 mg at 07/25/19 2120  . feeding supplement (ENSURE ENLIVE) (ENSURE ENLIVE) liquid 237 mL  237 mL Oral BID BM Swayze, Ava, DO   237 mL at 07/26/19 1508  . fentaNYL (SUBLIMAZE) injection 25-50 mcg  25-50 mcg Intravenous Q3H PRN Eugenie Norrie, NP   50 mcg at 07/24/19 1414  . folic acid (FOLVITE) tablet 1 mg  1 mg Oral Daily Kasa, Kurian, MD   1 mg at 07/26/19 0831  . haloperidol lactate (HALDOL) injection 2 mg  2 mg Intravenous Q6H PRN Swayze, Ava, DO      . HYDROcodone-homatropine (HYCODAN) 5-1.5 MG/5ML syrup 5 mL  5 mL Oral Q8H PRN Eugenie Norrie, NP   5 mL at 07/25/19 2120  . insulin aspart (novoLOG) injection 0-9 Units  0-9 Units Subcutaneous TID WC Swayze, Ava, DO   2 Units at 07/26/19 1704  . lactated ringers infusion   Intravenous Continuous Eugenie Norrie, NP 75 mL/hr at 07/25/19 0426 New Bag at 07/25/19 0426  . lidocaine (LIDODERM) 5 % 1 patch  1 patch Transdermal Q24H Zigmund Daniel., MD   1 patch at 07/26/19 1025  . multivitamin with minerals tablet 1 tablet  1 tablet Oral Daily Swayze, Ava, DO   1 tablet at 07/26/19 0831  . pantoprazole (PROTONIX) EC tablet 40 mg  40 mg Oral Daily Swayze, Ava, DO   40 mg at 07/26/19 0831  . predniSONE (DELTASONE) tablet 10 mg  10 mg Oral Q breakfast Eugenie Norrie, NP   10 mg at 07/26/19 0831  . QUEtiapine (SEROQUEL) tablet 25 mg  25 mg Oral QHS Swayze, Ava, DO   25 mg at 07/25/19 2119  . QUEtiapine (SEROQUEL) tablet 50 mg  50 mg Oral Daily Swayze, Ava, DO   50 mg at 07/26/19 0831  . rOPINIRole (REQUIP) tablet 1 mg  1 mg Oral q AM Swayze, Ava, DO   1 mg at 07/26/19 0830  . rOPINIRole (REQUIP) tablet 2 mg  2 mg Oral QHS Swayze, Ava, DO   2 mg at 07/25/19 2308  . sertraline (ZOLOFT) tablet 150 mg  150 mg Oral Daily Swayze, Ava, DO   150 mg at  07/26/19 0830  . thiamine tablet 100 mg  100 mg Oral Daily Erin Fulling, MD   100 mg at 07/26/19 0831  . tiotropium (SPIRIVA) inhalation capsule ([facility] use ONLY) 18 mcg  18 mcg Inhalation Daily Swayze, Ava, DO   18 mcg at 07/26/19 0832  . traZODone (DESYREL) tablet 50 mg  50 mg Oral QHS Eugenie Norrie, NP   50 mg at 07/25/19 2119    Musculoskeletal: Strength & Muscle Tone: decreased Gait & Station: unable to stand Patient leans: N/A  Psychiatric Specialty Exam: Physical Exam  Review of Systems  Blood pressure 113/90, pulse 93, temperature 98.3 F (36.8 C), temperature source Oral, resp. rate (!) 24, height 5\' 7"  (1.702 m), weight 55.3 kg, SpO2 95 %.Body mass index is 19.11 kg/m.  General Appearance: Casual  Eye Contact:  Good  Speech:  Clear and Coherent  Volume:  Decreased  Mood:  Euthymic  Affect:  Congruent  Thought Process:  Coherent  Orientation:  Full (Time, Place, and Person)  Thought Content:  Logical  Suicidal Thoughts:  No  Homicidal Thoughts:  No  Memory:  Recent;   Fair  Judgement:  Intact  Insight:  Fair  Psychomotor Activity:  Normal  Concentration:  Concentration: Fair  Recall:  of Knowledge:  Fair  Language:  Fair  Akathisia:  No  Handed:  Right  AIMS (if indicated):     Assets:  Communication Skills Desire for Improvement Resilience Social Support  ADL's:  Intact  Cognition:  WNL  Sleep:        Treatment Plan Summary: 59 year old male who experienced visual hallucinations in the past.  Unclear what could have caused these hallucinations however patient is not distressed by them and does not have any longstanding history of hallucinations or delusions.  Furthermore patient has no history of psychotic illness.  Possible causes of visual hallucinations include medication side effect as well as delirium.  Medication recommendations:   Patient taking multiple serotonergic medications will emanate some of those and effort to minimize  interactions.  Trazodone discontinued BuSpar discontinued Seroquel dosing rearranged to 25 mg daily, 50 mg nightly  Sertraline dose to be continued at 150 mg  Consider adjusting ropinirole dose if patient continues to experience visual hallucinations.   Disposition: No evidence of imminent risk to self or others at present.   Patient does not meet criteria for psychiatric inpatient admission. Supportive therapy provided about ongoing stressors. Discussed crisis plan, support from social network, calling 911, coming to the Emergency Department, and calling Suicide Hotline.  59, MD 07/26/2019 5:17 PM

## 2019-07-26 NOTE — Progress Notes (Signed)
Occupational Therapy Treatment Patient Details Name: Austin Blevins MRN: [id number] DOB: April 09, 1961 Today's Date: 07/26/2019    History of present illness Austin Blevins is a 59 y.o. male with medical history significant for hydrocephalus with known VP shunt, severe COPD with chronic respiratory failure on home O2 at 3 L, anxiety and restless leg syndrome who presented to the emergency room with severe respiratory distress and complaint of left  lateral chest pain and mid to lower back pain. Pain is worse with movement and coughing and is of moderate intensity described as sharp and nonradiating. CTA of his chest which was negative for PE but showed advanced emphysema and stable pulmonary nodules also showed a "mild T7 inferior endplate compression fracture is new." Pt t/f to IC09 on 07/22/2019 d/t less intelligible speech. Repeat CT unchanged. Neurology was consulted and felt pt might be delerious. Precedex drip was started that has since been discontinued.   OT comments  Pt seen for OT treatment this date for f/u re: safety with self care ADLs/ADL mobility. Pt t/f'ed to IC09 since OT evaluation with "continue at transfer" order in place to continue services. Pt able to perform sup to sit with MOD I, tolerates static sitting >18 minutes while completing UB bathing/grooming/dressing and LB dressing with setup. Pt requires MIN A with sit to stand from EOB with some UE shaking noted with push off and in static standing with UEs supported through RW. Pt with F static standing balance with CGA to MIN A. Pt able to take 5-6 shuffling side steps from bed to chair on pt's right. He demos good control to sit. Pt alert and oriented as well as appropriate with all commands/cues throughout session. Demonstrates good safety understanding. Pt left in chair with BST and all necessities including call bell in reach. RN informed of pt performance with OT. Overall, anticipate pt will require continued therapy while in acute  setting to improve strength and tolerance and anticipate that home with HHOT is still appropriate d/c disposition.    Follow Up Recommendations  Home health OT;Supervision/Assistance - 24 hour    Equipment Recommendations  3 in 1 bedside commode    Recommendations for Other Services      Precautions / Restrictions Precautions Precautions: Fall Restrictions Weight Bearing Restrictions: No       Mobility Bed Mobility Overal bed mobility: Modified Independent             General bed mobility comments: use of bed rail  Transfers Overall transfer level: Needs assistance Equipment used: Rolling walker (2 wheeled) Transfers: Sit to/from Stand Sit to Stand: Min assist         General transfer comment: pt with somewhat weak/shaking UEs when supporting to push to stand and once on walker. Pt has had B UE tremors on this hospitalization, but during this session, only shaking is noted when pt attempting to support his weight in standing    Balance Overall balance assessment: Needs assistance Sitting-balance support: Feet supported Sitting balance-Leahy Scale: Good     Standing balance support: Bilateral upper extremity supported;During functional activity Standing balance-Leahy Scale: Fair Standing balance comment: requires support of at least one UE in static standing                           ADL either performed or assessed with clinical judgement   ADL Overall ADL's : Needs assistance/impaired     Grooming: Wash/dry hands;Wash/dry face;Oral care;Set up;Sitting  Upper Body Bathing: Minimal assistance;Sitting       Upper Body Dressing : Minimal assistance;Sitting   Lower Body Dressing: Minimal assistance;Sit to/from stand Lower Body Dressing Details (indicate cue type and reason): pt dons socks in sitting with setup, requires MIN A for standing balance to simulate clothing mgt over hips Toilet Transfer: Minimal assistance;RW;BSC            Functional mobility during ADLs: Minimal assistance;Rolling walker(to take 5-6 small steps from bed to chair)       Vision Baseline Vision/History: Wears glasses Wears Glasses: Reading only Patient Visual Report: No change from baseline     Perception     Praxis      Cognition Arousal/Alertness: Awake/alert Behavior During Therapy: WFL for tasks assessed/performed Overall Cognitive Status: Within Functional Limits for tasks assessed                                          Exercises Other Exercises Other Exercises: OT facilitated education re: importance of OOB atcitivty including prevention of further illness and skin breakdown. Pt verbalized understanding and very agreeable to getting OOB.   Shoulder Instructions       General Comments      Pertinent Vitals/ Pain       Pain Assessment: Faces Faces Pain Scale: Hurts little more Pain Location: R chest/upper abdomen irritating pt some during session Pain Descriptors / Indicators: Grimacing;Discomfort Pain Intervention(s): Limited activity within patient's tolerance;Monitored during session  Home Living                                          Prior Functioning/Environment              Frequency  Min 2X/week        Progress Toward Goals  OT Goals(current goals can now be found in the care plan section)  Progress towards OT goals: Progressing toward goals  Acute Rehab OT Goals Patient Stated Goal: To return to PLOF OT Goal Formulation: With patient Time For Goal Achievement: 08/04/19 Potential to Achieve Goals: Good  Plan      Co-evaluation                 AM-PAC OT "6 Clicks" Daily Activity     Outcome Measure   Help from another person eating meals?: None Help from another person taking care of personal grooming?: A Little Help from another person toileting, which includes using toliet, bedpan, or urinal?: A Little Help from another person bathing  (including washing, rinsing, drying)?: A Little Help from another person to put on and taking off regular upper body clothing?: A Little Help from another person to put on and taking off regular lower body clothing?: A Little 6 Click Score: 19    End of Session Equipment Utilized During Treatment: Gait belt;Rolling walker  OT Visit Diagnosis: Repeated falls (R29.6);Other abnormalities of gait and mobility (R26.89);Feeding difficulties (R63.3)   Activity Tolerance Patient tolerated treatment well;Patient limited by fatigue   Patient Left with call bell/phone within reach;in chair;with chair alarm set;Other (comment)(bedside table in reach)   Nurse Communication Mobility status;Other (comment)(notified RN that pt up to chair, no chair alarm present on unit or apparatus to attach alarm in room.)        Time: [phone]  OT Time Calculation (min): 57 min  Charges: OT General Charges $OT Visit: 1 Visit OT Treatments $Self Care/Home Management : 23-37 mins $Therapeutic Activity: 23-37 mins  Gerrianne Scale, MS, OTR/L ascom [phone] 07/26/19, 3:23 PM

## 2019-07-27 ENCOUNTER — Inpatient Hospital Stay: Payer: Medicare Other

## 2019-07-27 LAB — CBC WITH DIFFERENTIAL/PLATELET
Abs Immature Granulocytes: 0.11 10*3/uL — ABNORMAL HIGH (ref 0.00–0.07)
Basophils Absolute: 0.1 10*3/uL (ref 0.0–0.1)
Basophils Relative: 0 %
Eosinophils Absolute: 0.2 10*3/uL (ref 0.0–0.5)
Eosinophils Relative: 2 %
HCT: 41.4 % (ref 39.0–52.0)
Hemoglobin: 13 g/dL (ref 13.0–17.0)
Immature Granulocytes: 1 %
Lymphocytes Relative: 10 %
Lymphs Abs: 1.2 10*3/uL (ref 0.7–4.0)
MCH: 30.5 pg (ref 26.0–34.0)
MCHC: 31.4 g/dL (ref 30.0–36.0)
MCV: 97.2 fL (ref 80.0–100.0)
Monocytes Absolute: 0.6 10*3/uL (ref 0.1–1.0)
Monocytes Relative: 5 %
Neutro Abs: 9.5 10*3/uL — ABNORMAL HIGH (ref 1.7–7.7)
Neutrophils Relative %: 82 %
Platelets: 243 10*3/uL (ref 150–400)
RBC: 4.26 MIL/uL (ref 4.22–5.81)
RDW: 12.3 % (ref 11.5–15.5)
WBC: 11.6 10*3/uL — ABNORMAL HIGH (ref 4.0–10.5)
nRBC: 0 % (ref 0.0–0.2)

## 2019-07-27 LAB — COMPREHENSIVE METABOLIC PANEL
ALT: 37 U/L (ref 0–44)
AST: 24 U/L (ref 15–41)
Albumin: 3.4 g/dL — ABNORMAL LOW (ref 3.5–5.0)
Alkaline Phosphatase: 76 U/L (ref 38–126)
Anion gap: 6 (ref 5–15)
BUN: 21 mg/dL — ABNORMAL HIGH (ref 6–20)
CO2: 32 mmol/L (ref 22–32)
Calcium: 9 mg/dL (ref 8.9–10.3)
Chloride: 104 mmol/L (ref 98–111)
Creatinine, Ser: 0.98 mg/dL (ref 0.61–1.24)
GFR calc Af Amer: >60 mL/min (ref >60)
GFR calc non Af Amer: >60 mL/min (ref >60)
Glucose, Bld: 95 mg/dL (ref 70–99)
Potassium: 4.5 mmol/L (ref 3.5–5.1)
Sodium: 142 mmol/L (ref 135–145)
Total Bilirubin: 0.6 mg/dL (ref 0.3–1.2)
Total Protein: 6.5 g/dL (ref 6.5–8.1)

## 2019-07-27 LAB — BLOOD GAS, ARTERIAL
Acid-Base Excess: 8.3 mmol/L — ABNORMAL HIGH (ref 0.0–2.0)
Bicarbonate: 36.4 mmol/L — ABNORMAL HIGH (ref 20.0–28.0)
FIO2: 0.4
O2 Saturation: 88.8 %
Patient temperature: 37
pCO2 arterial: 66 mmHg (ref 32.0–48.0)
pH, Arterial: 7.35 (ref 7.350–7.450)
pO2, Arterial: 59 mmHg — ABNORMAL LOW (ref 83.0–108.0)

## 2019-07-27 LAB — IRON: Iron: 66 ug/dL (ref 45–182)

## 2019-07-27 LAB — GLUCOSE, CAPILLARY
Glucose-Capillary: 192 mg/dL — ABNORMAL HIGH (ref 70–99)
Glucose-Capillary: 68 mg/dL — ABNORMAL LOW (ref 70–99)
Glucose-Capillary: 82 mg/dL (ref 70–99)
Glucose-Capillary: 87 mg/dL (ref 70–99)

## 2019-07-27 LAB — PHOSPHORUS: Phosphorus: 4.6 mg/dL (ref 2.5–4.6)

## 2019-07-27 LAB — TSH: TSH: 8.435 u[IU]/mL — ABNORMAL HIGH (ref 0.350–4.500)

## 2019-07-27 LAB — MAGNESIUM: Magnesium: 1.9 mg/dL (ref 1.7–2.4)

## 2019-07-27 MED ORDER — DEXTROSE IN LACTATED RINGERS 5 % IV SOLN: INTRAVENOUS | Status: DC

## 2019-07-27 MED ORDER — LORAZEPAM 2 MG/ML IJ SOLN
INTRAMUSCULAR | Status: AC
  Administered 2019-07-27: 02:00:00 2 mg via INTRAVENOUS
  Filled 2019-07-27: qty 1

## 2019-07-27 MED ORDER — IPRATROPIUM-ALBUTEROL 0.5-2.5 (3) MG/3ML IN SOLN
3.0000 mL | Freq: Four times a day (QID) | RESPIRATORY_TRACT | Status: DC
  Administered 2019-07-27 – 2019-07-29 (×8): 3 mL via RESPIRATORY_TRACT
  Filled 2019-07-27 (×8): qty 3

## 2019-07-27 MED ORDER — METHYLPREDNISOLONE SODIUM SUCC 40 MG IJ SOLR
40.0000 mg | Freq: Two times a day (BID) | INTRAMUSCULAR | Status: AC
  Administered 2019-07-27 – 2019-07-29 (×5): 40 mg via INTRAVENOUS
  Filled 2019-07-27 (×5): qty 1

## 2019-07-27 MED ORDER — ROPINIROLE HCL 1 MG PO TABS
4.0000 mg | ORAL_TABLET | Freq: Every day | ORAL | Status: DC
  Filled 2019-07-27: qty 4

## 2019-07-27 MED ORDER — DIAZEPAM 5 MG/ML IJ SOLN
2.5000 mg | INTRAMUSCULAR | Status: DC | PRN
  Administered 2019-07-27: 2.5 mg via INTRAVENOUS
  Filled 2019-07-27: qty 2

## 2019-07-27 MED ORDER — LORAZEPAM 2 MG/ML IJ SOLN: 2.0000 mg | Freq: Once | INTRAMUSCULAR | Status: AC

## 2019-07-27 MED ORDER — SODIUM CHLORIDE 0.9 % IV SOLN
2.0000 g | Freq: Three times a day (TID) | INTRAVENOUS | Status: DC
  Administered 2019-07-27 – 2019-07-30 (×8): 2 g via INTRAVENOUS
  Filled 2019-07-27 (×13): qty 2

## 2019-07-27 MED ORDER — ROPINIROLE HCL 1 MG PO TABS
2.0000 mg | ORAL_TABLET | ORAL | Status: AC
  Administered 2019-07-27: 05:00:00 2 mg via ORAL
  Filled 2019-07-27: qty 2

## 2019-07-27 MED ORDER — ROPINIROLE HCL 1 MG PO TABS
4.0000 mg | ORAL_TABLET | Freq: Every day | ORAL | Status: DC
  Administered 2019-07-27 – 2019-08-01 (×6): 4 mg via ORAL
  Filled 2019-07-27 (×8): qty 4

## 2019-07-27 MED ORDER — DEXMEDETOMIDINE HCL IN NACL 400 MCG/100ML IV SOLN: 0.4000 ug/kg/h | INTRAVENOUS | Status: DC

## 2019-07-27 MED ORDER — HALOPERIDOL LACTATE 5 MG/ML IJ SOLN
5.0000 mg | Freq: Once | INTRAMUSCULAR | Status: AC
  Administered 2019-07-27: 5 mg via INTRAVENOUS

## 2019-07-27 NOTE — Consult Note (Signed)
Pharmacy Antibiotic Note  Austin Blevins is a 59 y.o. male admitted on 07/20/2019 with HAP.  Pharmacy has been consulted for Cefepime dosing.  Plan: 1. Cefepime 2g Q8H  Height: 5\' 7"  (170.2 cm) Weight: 122 lb (55.3 kg) IBW/kg (Calculated) : 66.1  Temp (24hrs), Avg:97.9 F (36.6 C), Min:97.8 F (36.6 C), Max:98 F (36.7 C)  Recent Labs  Lab 07/21/19 1815 07/21/19 1935 07/21/19 2050 07/22/19 0437 07/22/19 1618 07/22/19 1856 07/22/19 1903 07/23/19 0519 07/24/19 0436 07/26/19 0407 07/27/19 0227  WBC  --    < >  --    < >  --   --  7.9 8.3 7.9 8.2 11.6*  CREATININE  --    < >  --    < >  --   --  1.05 0.92 1.01 1.01 0.98  LATICACIDVEN 2.6*  --  2.0*  --  3.8* 1.5  --   --   --   --   --    < > = values in this interval not displayed.    Estimated Creatinine Clearance: 65 mL/min (by C-G formula based on SCr of 0.98 mg/dL).    Allergies  Allergen Reactions  . Acetaminophen Other (See Comments)    Syncope  . Percocet [Oxycodone-Acetaminophen]     Antimicrobials this admission:   Dose adjustments this admission:   Microbiology results:   Thank you for allowing pharmacy to be a part of this patient's care.  09/26/19 07/27/2019 6:19 PM

## 2019-07-27 NOTE — Progress Notes (Addendum)
Pt has became extremely restless over the last 4 hours, multiple PRN medications tried with no improvement. NP paged and given additional orders in an attempt to improve pt condition. Lab called to obtain labs stat due to change in pt condition. Still no improvement, pt started back on precedex gtt with verbal order given by Jon Billings NP. Very minimal improvement since starting precedex gtt back.

## 2019-07-27 NOTE — Progress Notes (Addendum)
PROGRESS NOTE    Austin Blevins  YOV:[id number] DOB: 11/25/1960 DOA: 07/20/2019 PCP: [name], NP  Brief Narrative:  Austin Blevins Austin Blevins 59 y.o.malewith medical history significant forhydrocephalus with known VP shunt, severe COPD with chronic respiratory failure on home O2 at 3 L, anxiety and restless leg syndrome who presented to the emergency room with severe respiratory distress and complaint ofleftlateral chest pain and mid to lowerback pain. Symptoms have been ongoing for 4 days. Pain is worse with movement and coughing and is of moderate intensity described as sharp and nonradiating. He has no associated nausea vomiting or diaphoresis. Shortness of breath worsensthechest pain. He denies Austin Blevins fall. He denies fever, chills, abdominal pain, nausea vomiting or change in bowel movement.He saw his pulmonologist Austin Blevins few days prior for follow-up and received Austin Blevins prescription for steroid taper pack and tramadol for the back pain.  On arrival to the emergency room, per ED providerhe was initially very tachypneic at 30, using accessory muscles to breathe. O2 sat maintained at 90% on his home flow rate at 3 L. He was also tachycardic at 134 but afebrile. BP 172/86. On his blood work, BNP 44 troponin less than 2, white cell count 12,500 with otherwise unremarkable blood work. EKG showed no acute ST-T wave changes. He had Austin Blevins CTA of his chest which was negative for PE but showed advanced emphysema and stable pulmonary nodules also showed Austin Blevins new T7 fracture. Patient was given DuoNeb treatments in the emergency room as well as IV steroids with significant improvement in his breathing. Hospitalist consulted for admission.  The patient has been admitted to Austin Blevins telemetry bed. He is receiving steroids, antibiotics and nebulizer treatments. He is also receiving pain control. Sinus tachycardia has been addressed with ativan.   On 07/21/2019 the patient had increased heart rate and increased oxygen  requirements. CXR demonstrated Austin Blevins small left pleural effusion and emphysema. No change from previous. EKG demonstrated sinus tachycardia which was addressed with ativan.   On 07/22/2019 his wife expressed to nursing that the patient's tremors were worse than usual and that his speech was less intelligible than normal. CT head was performed and was unchanged from previous. Neurology was consulted. They felt that the patient was delerious and recommended antipsychotic for sleep. The patient was transferred to the ICU and started on Austin Blevins precedex drip. This has been able to be weaned to off. Mental status is improved. IV fluid bolus has been given for hypotension on the afternoon of 07/23/2019.  The patient has been transitioned off of precedex to librium. He is calmer. Heart rate and RR are improved, although he is somewhat lethargic.   Assessment & Plan:   Principal Problem:   COPD with acute exacerbation (Austin Blevins) Active Problems:   Chronic respiratory failure with hypoxia (HCC)   Pulmonary nodules   Chest pain at rest   Thoracic vertebral fracture (HCC)   COPD exacerbation (HCC)   Visual hallucinations  Acute on chronic respiratory failure with hypoxia (HCC)  COPD Exacerbation  COPD on 2 - 3 L at baseline (on 2 L at rest and 3 L with activity): The patient uses O2 at 3L by [place] at home chronically. Currently requiring 5 L.  No wheezing at the present.  He'd been treated with steroids and pain meds by his pulmonologist prior to admission.  - he's no longer on abx, but is back on his home steroid dose  - IS, flutter   - repeat CXR  - restart steroids with  wheezing, continue nebs, abx  Acute Metabolic Encephalopathy  Delirium:  On 3/6 pt with worsening tremors, difficult to understand speech.  Negative head CT.  Thought to be delirious at that time.  Overnight 3/10-11 he had increasing restlessness and restless leg symptoms, encephalopathic again.  ? If this was related to requip dose.  Was restarted  on precedex at this time. This morning he's lethargic, able to answer some questions appropriately.  Persistent tremors.  - Continue libirum BID  - Continue seroquel (50 mg qhs and 25 mg qam)  - Continue ropinirole (will increase to 4 mg at bedtime -> follow hallucinations, maybe worsened with this)  - appreciate neurology input -> suspected 2/2 delirium in setting of poor oxygenation -> seroquel if needed  - psychiatry has seen him, discontinued trazodone and buspar - seroquel adjusted  - s/p precedex, now on librium 25 mg TID.  Suggestion of benzo withdrawal from previous note, but I don't see this in home medlist or PMP aware.  Apparently takes meds from friends at times (unknown quantity - ? Valium, percocets). Will start to taper librium.  Will discuss with sister (sounds like he's not taking benzos that consistently?).   Visual hallucinations: Likely due to Chayna Surratt medication, ? Benzo withdrawal. Home medications that may be to blame include Hycodan, requip, Zoloft, or buspar. Also takes unknown quantity of ativan and medications that he gets from his friends occasionally.   - appreciate psych eval  Hypotension: Resolved.  Pulmonary nodules: CTA chest demonstrated unchanged bilateral pulmonary nodules with the largest in the right lower lobe. Follow is recommended as per previous recommendations per radiology.  Abdominal distention: Abdominal film raised Meiko Ives question of perigastric pneumoperitoniem. CT abdomen was performed which showed no obstruction, moderate stool load throughout the colonic confines, and no pneumoperitoneum.   Chest pain: Seems to be primarily with cough.  EKG without ischemic changes at presentation and negative troponins x 4.    - continue hycodan for cough  - voltaren, lidocaine patch    Acute T7 vertebral body fracture: Most certainly largely to blame for the patient's discomfort, but also possibly Tyrees Chopin consequence of the patient's cough due to acute bronchitis and his  chronic on and off steroid use for severe COPD. Doubt that this fracture and the patient's osteopenic spine would be appropriate for kyphoplasty. Will add toradol to pain control and consult PT/OT for mobility.  Chronic tremors:  Chronic. Worse today. Neurology does not see anything new in CT head.  Follow outpatient with neurology.  Anxiety: Continue BuSpar  Hydrocephalus with history of VP shunt: No acute concerns at this time related to the hydrocephalus itself.  Severe protein calorie malnutrition: Nutrition consulted. Supplements ordered.   DVT prophylaxis: lovenox Code Status: DNR Family Communication: none currently at bedside - sister  Disposition Plan:  . Patient came from: home            . Anticipated d/c place: SNF . Barriers to d/c OR conditions which need to be met to effect Maiko Salais safe d/c: improvement in pain control, respiratory status, placement for SNF   Consultants:   Neurology  Critical care  Procedures:  none  Antimicrobials: Anti-infectives (From admission, onward)   Start     Dose/Rate Route Frequency Ordered Stop   07/22/19 2200  vancomycin (VANCOREADY) IVPB 1250 mg/250 mL  Status:  Discontinued     1,250 mg 166.7 mL/hr over 90 Minutes Intravenous Every 24 hours 07/21/19 2119 07/22/19 1657   07/21/19 2200  piperacillin-tazobactam (ZOSYN)  IVPB 3.375 g  Status:  Discontinued     3.375 g 12.5 mL/hr over 240 Minutes Intravenous Every 8 hours 07/21/19 2117 07/22/19 1657   07/21/19 2130  vancomycin (VANCOREADY) IVPB 1250 mg/250 mL     1,250 mg 166.7 mL/hr over 90 Minutes Intravenous  Once 07/21/19 2117 07/22/19 0120   07/20/19 1830  azithromycin (ZITHROMAX) 500 mg in sodium chloride 0.9 % 250 mL IVPB     500 mg 250 mL/hr over 60 Minutes Intravenous  Once 07/20/19 1824 07/20/19 1956     Subjective: Janiel Crisostomo&Ox2 Lethargic No clear complaints  Objective: Vitals:   07/27/19 0800 07/27/19 0900 07/27/19 1000 07/27/19 1031  BP: (!) 155/104 (!) 97/52 (!) 81/53  96/64  Pulse:  73 68 75  Resp: (!) 27 (!) 22 (!) 29 (!) 28  Temp:      TempSrc:      SpO2:  99% 96% 97%  Weight:      Height:        Intake/Output Summary (Last 24 hours) at 07/27/2019 1208 Last data filed at 07/26/2019 2300 Gross per 24 hour  Intake --  Output 1400 ml  Net -1400 ml   Filed Weights   07/20/19 1652  Weight: 55.3 kg    Examination:  General: No acute distress. Cardiovascular: Heart sounds show Mingo Siegert regular rate, and rhythm.  Lungs: Clear to auscultation bilaterally Abdomen: Soft, nontender, nondistended Neurological: Alert and oriented 2. Moves all extremities 4.  Lethargic.  Tremors of lower extremities. Cranial nerves II through XII grossly intact. Skin: Warm and dry. No rashes or lesions. Extremities: No clubbing or cyanosis. No edema.   Data Reviewed: I have personally reviewed following labs and imaging studies  CBC: Recent Labs  Lab 07/20/19 1650 07/20/19 1922 07/21/19 1935 07/22/19 0437 07/22/19 1903 07/23/19 0519 07/24/19 0436 07/26/19 0407 07/27/19 0227  WBC 12.5*   < > 11.8*   < > 7.9 8.3 7.9 8.2 11.6*  NEUTROABS 11.9*  --  10.2*  --   --   --  6.1  --  9.5*  HGB 14.9   < > 13.0   < > 13.0 12.1* 11.9* 11.7* 13.0  HCT 46.7   < > 41.5   < > 41.0 37.3* 38.0* 37.0* 41.4  MCV 97.5   < > 97.9   < > 98.6 96.4 98.4 97.1 97.2  PLT 315   < > 240   < > 198 171 182 182 243   < > = values in this interval not displayed.   Basic Metabolic Panel: Recent Labs  Lab 07/22/19 1903 07/23/19 0519 07/24/19 0436 07/26/19 0407 07/27/19 0227  NA 139 139 140 140 142  K 4.9 4.2 3.9 3.9 4.5  CL 101 97* 103 102 104  CO2 32 33* 33* 32 32  GLUCOSE 71 122* 77 116* 95  BUN 26* 22* 21* 23* 21*  CREATININE 1.05 0.92 1.01 1.01 0.98  CALCIUM 8.9 8.4* 8.6* 8.4* 9.0  MG  --  2.6* 2.3  --  1.9  PHOS  --  3.3 4.0  --  4.6   GFR: Estimated Creatinine Clearance: 65 mL/min (by C-G formula based on SCr of 0.98 mg/dL). Liver Function Tests: Recent Labs  Lab  07/20/19 1650 07/21/19 1935 07/22/19 0437 07/22/19 1903 07/27/19 0227  AST _0 ALT _1 37  ALKPHOS 94 73 62 69 76  BILITOT 0.5 0.6 0.7 0.4 0.6  PROT 7.5 6.4* 5.7* 6.4* 6.5  ALBUMIN 3.9 3.4* 3.1* 3.5 3.4*   Recent Labs  Lab 07/20/19 1650  LIPASE 108*   No results for input(s): AMMONIA in the last 168 hours. Coagulation Profile: Recent Labs  Lab 07/21/19 1935  INR 0.9   Cardiac Enzymes: No results for input(s): CKTOTAL, CKMB, CKMBINDEX, TROPONINI in the last 168 hours. BNP (last 3 results) No results for input(s): PROBNP in the last 8760 hours. HbA1C: No results for input(s): HGBA1C in the last 72 hours. CBG: Recent Labs  Lab 07/26/19 1931 07/27/19 0003 07/27/19 0746 07/27/19 0848 07/27/19 1140  GLUCAP 182* 192* 68* 82 87   Lipid Profile: No results for input(s): CHOL, HDL, LDLCALC, TRIG, CHOLHDL, LDLDIRECT in the last 72 hours. Thyroid Function Tests: No results for input(s): TSH, T4TOTAL, FREET4, T3FREE, THYROIDAB in the last 72 hours. Anemia Panel: No results for input(s): VITAMINB12, FOLATE, FERRITIN, TIBC, IRON, RETICCTPCT in the last 72 hours. Sepsis Labs: Recent Labs  Lab 07/21/19 1815 07/21/19 1935 07/21/19 2050 07/22/19 1618 07/22/19 1856  PROCALCITON  --  0.11  --   --   --   LATICACIDVEN 2.6*  --  2.0* 3.8* 1.5    Recent Results (from the past 240 hour(s))  SARS CORONAVIRUS 2 (TAT 6-24 HRS) Nasopharyngeal Nasopharyngeal Swab     Status: None   Collection Time: 07/20/19  8:09 PM   Specimen: Nasopharyngeal Swab  Result Value Ref Range Status   SARS Coronavirus 2 NEGATIVE NEGATIVE Final    Comment: (NOTE) SARS-CoV-2 target nucleic acids are NOT DETECTED. The SARS-CoV-2 RNA is generally detectable in upper and lower respiratory specimens during the acute phase of infection. Negative results do not preclude SARS-CoV-2 infection, do not rule out co-infections with other pathogens, and should not be used as the sole basis  for treatment or other patient management decisions. Negative results must be combined with clinical observations, patient history, and epidemiological information. The expected result is Negative. Fact Sheet for Patients: SugarRoll.be Fact Sheet for Healthcare Providers: https://www.woods-mathews.com/ This test is not yet approved or cleared by the [place] FDA and  has been authorized for detection and/or diagnosis of SARS-CoV-2 by FDA under an Emergency Use Authorization (EUA). This EUA will remain  in effect (meaning this test can be used) for the duration of the COVID-19 declaration under Section 56 4(b)(1) of the Act, 21 U.S.C. section 360bbb-3(b)(1), unless the authorization is terminated or revoked sooner. Performed at [facility], [place] [address] [place], [place] [address]   Culture, blood (x 2)     Status: None   Collection Time: 07/21/19  7:37 PM   Specimen: BLOOD  Result Value Ref Range Status   Specimen Description BLOOD LEFT ANTECUBITAL  Final   Special Requests   Final    BOTTLES DRAWN AEROBIC AND ANAEROBIC Blood Culture adequate volume   Culture   Final    NO GROWTH 5 DAYS Performed at [facility], [address] [place], [place] [address]    Report Status 07/26/2019 FINAL  Final  Culture, blood (x 2)     Status: None   Collection Time: 07/21/19  7:38 PM   Specimen: BLOOD  Result Value Ref Range Status   Specimen Description BLOOD BLOOD LEFT HAND  Final   Special Requests   Final    BOTTLES DRAWN AEROBIC AND ANAEROBIC Blood Culture adequate volume   Culture   Final    NO GROWTH 5 DAYS Performed at [facility], [address] [place],  [address]  Report Status 07/26/2019 FINAL  Final  Urine culture     Status: None   Collection Time: 07/22/19  6:00 AM   Specimen: Urine, Random  Result Value Ref Range Status   Specimen Description   Final    URINE, RANDOM Performed at  [facility], [address] [place], [place] [address]    Special Requests   Final    NONE Performed at [facility], [address], [place], [place] [address]    Culture   Final    NO GROWTH Performed at [facility], [place] [address] [place], [place] [address]    Report Status 07/23/2019 FINAL  Final  MRSA PCR Screening     Status: None   Collection Time: 07/22/19 12:07 PM   Specimen: Nasal Mucosa; Nasopharyngeal  Result Value Ref Range Status   MRSA by PCR NEGATIVE NEGATIVE Final    Comment:        The GeneXpert MRSA Assay (FDA approved for NASAL specimens only), is one component of Carlis Blanchard comprehensive MRSA colonization surveillance program. It is not intended to diagnose MRSA infection nor to guide or monitor treatment for MRSA infections. Performed at [facility], [address] [place], [place] [address]          Radiology Studies: No results found.      Scheduled Meds: . budesonide (PULMICORT) nebulizer solution  0.5 mg Nebulization BID  . chlordiazePOXIDE  25 mg Oral BID  . Chlorhexidine Gluconate Cloth  6 each Topical Daily  . diclofenac Sodium  2 g Topical QID  . enoxaparin (LOVENOX) injection  40 mg Subcutaneous Q24H  . feeding supplement (ENSURE ENLIVE)  237 mL Oral BID BM  . folic acid  1 mg Oral Daily  . insulin aspart  0-9 Units Subcutaneous TID WC  . lidocaine  1 patch Transdermal Q24H  . multivitamin with minerals  1 tablet Oral Daily  . pantoprazole  40 mg Oral Daily  . predniSONE  10 mg Oral Q breakfast  . QUEtiapine  25 mg Oral Daily  . QUEtiapine  50 mg Oral QHS  . rOPINIRole  1 mg Oral q AM  . rOPINIRole  4 mg Oral QHS  . sertraline  150 mg Oral Daily  . thiamine  100 mg Oral Daily  . tiotropium  18 mcg Inhalation Daily   Continuous Infusions: . dexmedetomidine (PRECEDEX) IV infusion Stopped (07/27/19 0825)  . lactated ringers 75 mL/hr at 07/27/19 1039     LOS: 6 days    Time spent: over 30  min    Fayrene Helper, MD [facility]   To contact the attending provider between 7A-7P or the covering provider during after hours 7P-7A, please log into the web site [email or web address] and access using universal [place] password for that web site. If you do not have the password, please call the hospital operator.  07/27/2019, 12:08 PM

## 2019-07-27 NOTE — Progress Notes (Signed)
OVERNIGHT Starting around 11 pm patietn with development of withdrawal type symptoms demonstrated by restlessness and severe restless leg symptoms. Patient had earlier informed nurse that his bedtime dose of requip had been 5 mg a. Symptoms excalated, accompanied with sinus tach rate 140-150.  No response from haldol or ativan necessitating restart of continuous precedex infusion.   Review of patient meds and recent changes. Concerning for withdrawal with sudden  With review of meds with patient, nurse and pharmacy. Patient sted he had similar symptoms when he did not have his requip in past.  Additional one time dose of requip was given with improvement in symptoms.  Likely patient would benefit from increased dose of requip to close to home dose regiment and slowly taper.  Given the symptoms, he may also require buspar restart with slow taper but currently he is slowly being weaned from the precedex

## 2019-07-28 ENCOUNTER — Other Ambulatory Visit: Payer: Self-pay | Admitting: Nurse Practitioner

## 2019-07-28 LAB — COMPREHENSIVE METABOLIC PANEL
ALT: 40 U/L (ref 0–44)
AST: 28 U/L (ref 15–41)
Albumin: 3 g/dL — ABNORMAL LOW (ref 3.5–5.0)
Alkaline Phosphatase: 73 U/L (ref 38–126)
Anion gap: 9 (ref 5–15)
BUN: 20 mg/dL (ref 6–20)
CO2: 28 mmol/L (ref 22–32)
Calcium: 8.5 mg/dL — ABNORMAL LOW (ref 8.9–10.3)
Chloride: 101 mmol/L (ref 98–111)
Creatinine, Ser: 1.01 mg/dL (ref 0.61–1.24)
GFR calc Af Amer: >60 mL/min (ref >60)
GFR calc non Af Amer: >60 mL/min (ref >60)
Glucose, Bld: 196 mg/dL — ABNORMAL HIGH (ref 70–99)
Potassium: 4.5 mmol/L (ref 3.5–5.1)
Sodium: 138 mmol/L (ref 135–145)
Total Bilirubin: 0.6 mg/dL (ref 0.3–1.2)
Total Protein: 6.1 g/dL — ABNORMAL LOW (ref 6.5–8.1)

## 2019-07-28 LAB — CBC WITH DIFFERENTIAL/PLATELET
Abs Immature Granulocytes: 0.07 10*3/uL (ref 0.00–0.07)
Basophils Absolute: 0 10*3/uL (ref 0.0–0.1)
Basophils Relative: 0 %
Eosinophils Absolute: 0 10*3/uL (ref 0.0–0.5)
Eosinophils Relative: 0 %
HCT: 40.4 % (ref 39.0–52.0)
Hemoglobin: 12.8 g/dL — ABNORMAL LOW (ref 13.0–17.0)
Immature Granulocytes: 1 %
Lymphocytes Relative: 5 %
Lymphs Abs: 0.5 10*3/uL — ABNORMAL LOW (ref 0.7–4.0)
MCH: 30.2 pg (ref 26.0–34.0)
MCHC: 31.7 g/dL (ref 30.0–36.0)
MCV: 95.3 fL (ref 80.0–100.0)
Monocytes Absolute: 0.4 10*3/uL (ref 0.1–1.0)
Monocytes Relative: 4 %
Neutro Abs: 9.3 10*3/uL — ABNORMAL HIGH (ref 1.7–7.7)
Neutrophils Relative %: 90 %
Platelets: 208 10*3/uL (ref 150–400)
RBC: 4.24 MIL/uL (ref 4.22–5.81)
RDW: 12.1 % (ref 11.5–15.5)
WBC: 10.3 10*3/uL (ref 4.0–10.5)
nRBC: 0 % (ref 0.0–0.2)

## 2019-07-28 LAB — GLUCOSE, CAPILLARY
Glucose-Capillary: 109 mg/dL — ABNORMAL HIGH (ref 70–99)
Glucose-Capillary: 135 mg/dL — ABNORMAL HIGH (ref 70–99)
Glucose-Capillary: 181 mg/dL — ABNORMAL HIGH (ref 70–99)
Glucose-Capillary: 182 mg/dL — ABNORMAL HIGH (ref 70–99)
Glucose-Capillary: 205 mg/dL — ABNORMAL HIGH (ref 70–99)
Glucose-Capillary: 240 mg/dL — ABNORMAL HIGH (ref 70–99)
Glucose-Capillary: 271 mg/dL — ABNORMAL HIGH (ref 70–99)
Glucose-Capillary: 287 mg/dL — ABNORMAL HIGH (ref 70–99)

## 2019-07-28 LAB — PHOSPHORUS: Phosphorus: 4.5 mg/dL (ref 2.5–4.6)

## 2019-07-28 LAB — PROCALCITONIN: Procalcitonin: 0.11 ng/mL

## 2019-07-28 LAB — MAGNESIUM: Magnesium: 2.1 mg/dL (ref 1.7–2.4)

## 2019-07-28 MED ORDER — INSULIN ASPART 100 UNIT/ML ~~LOC~~ SOLN
0.0000 [IU] | SUBCUTANEOUS | Status: DC
  Administered 2019-07-29: 1 [IU] via SUBCUTANEOUS
  Administered 2019-07-29 (×2): 5 [IU] via SUBCUTANEOUS
  Administered 2019-07-29: 3 [IU] via SUBCUTANEOUS
  Administered 2019-07-29: 16:00:00 5 [IU] via SUBCUTANEOUS
  Administered 2019-07-29: 1 [IU] via SUBCUTANEOUS
  Administered 2019-07-29 – 2019-07-30 (×2): 3 [IU] via SUBCUTANEOUS
  Administered 2019-07-30: 18:00:00 5 [IU] via SUBCUTANEOUS
  Administered 2019-07-30 (×2): 1 [IU] via SUBCUTANEOUS
  Filled 2019-07-28 (×11): qty 1

## 2019-07-28 MED ORDER — CHLORDIAZEPOXIDE HCL 25 MG PO CAPS
25.0000 mg | ORAL_CAPSULE | Freq: Every day | ORAL | Status: DC
  Administered 2019-07-29: 25 mg via ORAL
  Filled 2019-07-28: qty 1

## 2019-07-28 NOTE — Plan of Care (Signed)

## 2019-07-28 NOTE — Consult Note (Signed)
Pharmacy Antibiotic Note  Austin Blevins is a 59 y.o. male admitted on 07/20/2019. Patient transferred to ICU due to withdrawal. Pharmacy has been consulted for Cefepime dosing for possible pneumonia.   Plan: Cefepime 2g Q8H  Height: 5\' 7"  (170.2 cm) Weight: 122 lb (55.3 kg) IBW/kg (Calculated) : 66.1  Temp (24hrs), Avg:98.1 F (36.7 C), Min:98.1 F (36.7 C), Max:98.1 F (36.7 C)  Recent Labs  Lab 07/21/19 1815 07/21/19 1935 07/21/19 2050 07/22/19 0437 07/22/19 1618 07/22/19 1856 07/22/19 1903 07/23/19 0519 07/24/19 0436 07/26/19 0407 07/27/19 0227 07/28/19 0632  WBC  --    < >  --    < >  --   --    < > 8.3 7.9 8.2 11.6* 10.3  CREATININE  --    < >  --    < >  --   --    < > 0.92 1.01 1.01 0.98 1.01  LATICACIDVEN 2.6*  --  2.0*  --  3.8* 1.5  --   --   --   --   --   --    < > = values in this interval not displayed.    Estimated Creatinine Clearance: 63.1 mL/min (by C-G formula based on SCr of 1.01 mg/dL).    Allergies  Allergen Reactions  . Acetaminophen Other (See Comments)    Syncope  . Percocet [Oxycodone-Acetaminophen]     Antimicrobials this admission: Cefepime 3/11 >>   Dose adjustments this admission: N/A  Microbiology results: 3/4 SARS Coronavirus: negative 3/6 MRSA: negative  3/6 Urine Cx: no growth  3/5 Blood Cx: no growth   Thank you for allowing pharmacy to be a part of this patient's care.  Kebrina Friend L 07/28/2019 4:54 PM

## 2019-07-28 NOTE — TOC Initial Note (Signed)
Transition of Care [facility]) - Initial/Assessment Note    Patient Details  Name: Austin Blevins MRN: [id number] Date of Birth: 09/02/1960  [facility]) CM/SW Contact:    Austin Ivan, LCSW Phone Number: 07/28/2019, 11:16 AM  Clinical Narrative:          CSW met with patient and brother Austin Blevins) at bedside. Discussed PT recommendation for SNF at discharge. Austin Blevins reported patient has good family support and they would need to discuss options before deciding. Austin Blevins reported patient has a hospital bed and oxygen at home. Austin Blevins reported someone checks in with him frequently, but was not sure of the agency. Per notes and Austin Blevins with [facility], patient is followed by [facility] for Outpatient Palliative. CSW provided CSW's contact information for questions/follow up. CSW will continue to follow.               Patient Goals and CMS Choice        Expected Discharge Plan and Services                                                Prior Living Arrangements/Services                       Activities of Daily Living Home Assistive Devices/Equipment: None ADL Screening (condition at time of admission) Patient's cognitive ability adequate to safely complete daily activities?: Yes Is the patient deaf or have difficulty hearing?: No Does the patient have difficulty seeing, even when wearing glasses/contacts?: No Does the patient have difficulty concentrating, remembering, or making decisions?: No Patient able to express need for assistance with ADLs?: Yes Does the patient have difficulty dressing or bathing?: No Independently performs ADLs?: Yes (appropriate for developmental age) Does the patient have difficulty walking or climbing stairs?: No Weakness of Legs: None Weakness of Arms/Hands: None  Permission Sought/Granted                  Emotional Assessment              Admission diagnosis:  Shortness of breath [R06.02] COPD exacerbation ([place])  [J44.1] COPD with acute exacerbation ([place]) [J44.1] Chest pain in adult [R07.9] Thoracic vertebral fracture ([place]) [S22.009A] Patient Active Problem List   Diagnosis Date Noted  . Visual hallucinations   . Hydrocephalus ([place]) 07/21/2019  . Thoracic vertebral fracture ([place]) 07/21/2019  . COPD exacerbation ([place]) 07/21/2019  . COPD with acute exacerbation ([place]) 07/20/2019  . Chest pain at rest 07/20/2019  . Pulmonary nodules 05/27/2019  . Low back pain 12/21/2018  . DNR (do not resuscitate) 10/24/2018  . Restless legs syndrome (RLS) 08/02/2018  . Gastroesophageal reflux disease 08/02/2018  . Rotator cuff dysfunction, left 07/19/2018  . Prediabetes 07/17/2018  . Nicotine dependence, cigarettes, w unsp disorders 05/25/2017  . Chronic pancreatitis ([place]) 05/25/2017  . Stage 3 severe COPD by GOLD classification ([place]) 02/11/2017  . Protein-calorie malnutrition, severe 01/22/2017  . Chronic respiratory failure with hypoxia ([place]) 01/21/2017  . Depression, major, recurrent, moderate ([place]) 04/11/2014   PCP:  [name], NP Pharmacy:   [place] [place] [address] Phone: [phone] Fax: [phone]     Social Determinants of Health (SDOH) Interventions    Readmission Risk Interventions No flowsheet data found.

## 2019-07-28 NOTE — Progress Notes (Signed)
PROGRESS NOTE    Austin Blevins  HFG:[id number] DOB: Jul 11, 1960 DOA: 07/20/2019 PCP: [name], NP  Brief Narrative:  VERA WISHART Yaniah Thiemann 59 y.o.malewith medical history significant forhydrocephalus with known VP shunt, severe COPD with chronic respiratory failure on home O2 at 3 L, anxiety and restless leg syndrome who presented to the emergency room with severe respiratory distress and complaint ofleftlateral chest pain and mid to lowerback pain. Symptoms have been ongoing for 4 days. Pain is worse with movement and coughing and is of moderate intensity described as sharp and nonradiating. He has no associated nausea vomiting or diaphoresis. Shortness of breath worsensthechest pain. He denies Shanelle Clontz fall. He denies fever, chills, abdominal pain, nausea vomiting or change in bowel movement.He saw his pulmonologist Rasa Degrazia few days prior for follow-up and received Krislynn Gronau prescription for steroid taper pack and tramadol for the back pain.  On arrival to the emergency room, per ED providerhe was initially very tachypneic at 30, using accessory muscles to breathe. O2 sat maintained at 90% on his home flow rate at 3 L. He was also tachycardic at 134 but afebrile. BP 172/86. On his blood work, BNP 44 troponin less than 2, white cell count 12,500 with otherwise unremarkable blood work. EKG showed no acute ST-T wave changes. He had Opaline Reyburn CTA of his chest which was negative for PE but showed advanced emphysema and stable pulmonary nodules also showed Anavictoria Wilk new T7 fracture. Patient was given DuoNeb treatments in the emergency room as well as IV steroids with significant improvement in his breathing. Hospitalist consulted for admission.  The patient has been admitted to [facility] telemetry bed. He is receiving steroids, antibiotics and nebulizer treatments. He is also receiving pain control. Sinus tachycardia has been addressed with ativan.   On 07/21/2019 the patient had increased heart rate and increased oxygen  requirements. CXR demonstrated Joelie Schou small left pleural effusion and emphysema. No change from previous. EKG demonstrated sinus tachycardia which was addressed with ativan.   On 07/22/2019 his wife expressed to nursing that the patient's tremors were worse than usual and that his speech was less intelligible than normal. CT head was performed and was unchanged from previous. Neurology was consulted. They felt that the patient was delerious and recommended antipsychotic for sleep. The patient was transferred to the ICU and started on Takari Lundahl precedex drip. This has been able to be weaned to off. Mental status is improved. IV fluid bolus has been given for hypotension on the afternoon of 07/23/2019.  The patient has been transitioned off of precedex to librium. He is calmer. Heart rate and RR are improved, although he is somewhat lethargic.  Assessment & Plan:   Principal Problem:   COPD with acute exacerbation ([place]) Active Problems:   Chronic respiratory failure with hypoxia (HCC)   Pulmonary nodules   Chest pain at rest   Thoracic vertebral fracture (HCC)   COPD exacerbation (HCC)   Visual hallucinations  Acute on chronic respiratory failure with hypoxia (HCC)  COPD Exacerbation  COPD on 2 - 3 L at baseline (on 2 L at rest and 3 L with activity): The patient uses O2 at 3L by [place] at home chronically. Currently requiring 5 L.  He'd been treated with steroids and pain meds by his pulmonologist prior to admission.  - wheezing improved today on steroids  - continue antibiotics  - IS, flutter   - repeat CXR 3/11 notable for bilateral lung opacities with mild loculated L effusion  Acute Metabolic Encephalopathy  Delirium:  On 3/6 pt with worsening tremors, difficult to understand speech.  Negative head CT.  Thought to be delirious at that time.  Overnight 3/10-11 he had increasing restlessness and restless leg symptoms, encephalopathic again.  ? If this was related to requip dose.  Was restarted on precedex  at this time, now d/c'd.  Continues to be encephalopathic with lethargy today.  - Decrease librium to nightly  - Continue seroquel (50 mg qhs and 25 mg qam)  - Continue ropinirole for restless legs, which is worsening delirium (will increase to 4 mg at bedtime -> follow hallucinations, maybe worsened with this)  - appreciate neurology input -> suspected 2/2 delirium in setting of poor oxygenation -> seroquel if needed  - psychiatry has seen him, discontinued trazodone and buspar - seroquel adjusted  - s/p precedex, now on librium as above.  Suggestion of benzo withdrawal from previous note, but I don't see this in home medlist or PMP aware.  Apparently takes meds from friends at times (unknown quantity - ? Valium, percocets). Will start to taper librium.  Will discuss with sister (sounds like he's not taking benzos that consistently?).   Visual hallucinations: Likely due to Zackari Ruane medication, ? Benzo withdrawal. Home medications that may be to blame include Hycodan, requip, Zoloft, or buspar. Also takes unknown quantity of ativan and medications that he gets from his friends occasionally.   - appreciate psych eval  Hypotension: Resolved.  Pulmonary nodules: CTA chest demonstrated unchanged bilateral pulmonary nodules with the largest in the right lower lobe. Follow is recommended as per previous recommendations per radiology.  Abdominal distention: Abdominal film raised Glanda Spanbauer question of perigastric pneumoperitoniem. CT abdomen was performed which showed no obstruction, moderate stool load throughout the colonic confines, and no pneumoperitoneum.   Chest pain: Seems to be primarily with cough.  EKG without ischemic changes at presentation and negative troponins x 4.    - continue hycodan for cough  - voltaren, lidocaine patch    Acute T7 vertebral body fracture: Most certainly largely to blame for the patient's discomfort, but also possibly Tymere Depuy consequence of the patient's cough due to acute  bronchitis and his chronic on and off steroid use for severe COPD. Doubt that this fracture and the patient's osteopenic spine would be appropriate for kyphoplasty. Will add toradol to pain control and consult PT/OT for mobility.  Chronic tremors:  Chronic. Worse today. Neurology does not see anything new in CT head.  Follow outpatient with neurology.  Anxiety: Continue BuSpar  Hydrocephalus with history of VP shunt: No acute concerns at this time related to the hydrocephalus itself.  Severe protein calorie malnutrition: Nutrition consulted. Supplements ordered.   DVT prophylaxis: lovenox Code Status: DNR Family Communication: none currently at bedside - sister  Disposition Plan:  . Patient came from: home            . Anticipated d/c place: SNF . Barriers to d/c OR conditions which need to be met to effect Sarabeth Benton safe d/c: improvement in pain control, respiratory status, placement for SNF   Consultants:   Neurology  Critical care  Procedures:  none  Antimicrobials: Anti-infectives (From admission, onward)   Start     Dose/Rate Route Frequency Ordered Stop   07/27/19 1900  ceFEPIme (MAXIPIME) 2 g in sodium chloride 0.9 % 100 mL IVPB     2 g 200 mL/hr over 30 Minutes Intravenous Every 8 hours 07/27/19 1820     07/22/19 2200  vancomycin (VANCOREADY) IVPB 1250 mg/250  mL  Status:  Discontinued     1,250 mg 166.7 mL/hr over 90 Minutes Intravenous Every 24 hours 07/21/19 2119 07/22/19 1657   07/21/19 2200  piperacillin-tazobactam (ZOSYN) IVPB 3.375 g  Status:  Discontinued     3.375 g 12.5 mL/hr over 240 Minutes Intravenous Every 8 hours 07/21/19 2117 07/22/19 1657   07/21/19 2130  vancomycin (VANCOREADY) IVPB 1250 mg/250 mL     1,250 mg 166.7 mL/hr over 90 Minutes Intravenous  Once 07/21/19 2117 07/22/19 0120   07/20/19 1830  azithromycin (ZITHROMAX) 500 mg in sodium chloride 0.9 % 250 mL IVPB     500 mg 250 mL/hr over 60 Minutes Intravenous  Once 07/20/19 1824 07/20/19 1956       Subjective: Lethargic, Keali Mccraw&Ox1 today.  Awakens to answer some questions, but falls promptly back to sleep.  Objective: Vitals:   07/28/19 0600 07/28/19 0700 07/28/19 0730 07/28/19 0800  BP: (!) 122/109 (!) 156/104 128/84 134/77  Pulse: 94 98 97 81  Resp: (!) 28 17 (!) 36 (!) 29  Temp:      TempSrc:      SpO2: 99% 94% 97% 99%  Weight:      Height:        Intake/Output Summary (Last 24 hours) at 07/28/2019 1135 Last data filed at 07/28/2019 1041 Gross per 24 hour  Intake 1112.07 ml  Output 2850 ml  Net -1737.93 ml   Filed Weights   07/20/19 1652  Weight: 55.3 kg    Examination:  General: No acute distress. Cardiovascular: Heart sounds show Trinitey Roache regular rate, and rhythm Lungs: coarse breath sounds, wheezing improved Abdomen: Soft, nontender, nondistended Neurological: Alert and oriented 1, lethargic. Moves all extremities 4 with equal strength. Cranial nerves II through XII grossly intact. Skin: Warm and dry. No rashes or lesions. Extremities: No clubbing or cyanosis. No edema.   Data Reviewed: I have personally reviewed following labs and imaging studies  CBC: Recent Labs  Lab 07/21/19 1935 07/22/19 0437 07/23/19 0519 07/24/19 0436 07/26/19 0407 07/27/19 0227 07/28/19 0632  WBC 11.8*   < > 8.3 7.9 8.2 11.6* 10.3  NEUTROABS 10.2*  --   --  6.1  --  9.5* 9.3*  HGB 13.0   < > 12.1* 11.9* 11.7* 13.0 12.8*  HCT 41.5   < > 37.3* 38.0* 37.0* 41.4 40.4  MCV 97.9   < > 96.4 98.4 97.1 97.2 95.3  PLT 240   < > 171 182 182 243 208   < > = values in this interval not displayed.   Basic Metabolic Panel: Recent Labs  Lab 07/23/19 0519 07/24/19 0436 07/26/19 0407 07/27/19 0227 07/28/19 0632  NA 139 140 140 142 138  K 4.2 3.9 3.9 4.5 4.5  CL 97* 103 102 104 101  CO2 33* 33* 32 32 28  GLUCOSE 122* 77 116* 95 196*  BUN 22* 21* 23* 21* 20  CREATININE 0.92 1.01 1.01 0.98 1.01  CALCIUM 8.4* 8.6* 8.4* 9.0 8.5*  MG 2.6* 2.3  --  1.9 2.1  PHOS 3.3 4.0  --  4.6 4.5    GFR: Estimated Creatinine Clearance: 63.1 mL/min (by C-G formula based on SCr of 1.01 mg/dL). Liver Function Tests: Recent Labs  Lab 07/21/19 1935 07/22/19 0437 07/22/19 1903 07/27/19 0227 07/28/19 0632  AST '29 16 19 24 28  ' ALT '19 19 21 ' 37 40  ALKPHOS 73 62 69 76 73  BILITOT 0.6 0.7 0.4 0.6 0.6  PROT 6.4* 5.7* 6.4* 6.5 6.1*  ALBUMIN 3.4* 3.1* 3.5 3.4* 3.0*   No results for input(s): LIPASE, AMYLASE in the last 168 hours. No results for input(s): AMMONIA in the last 168 hours. Coagulation Profile: Recent Labs  Lab 07/21/19 1935  INR 0.9   Cardiac Enzymes: No results for input(s): CKTOTAL, CKMB, CKMBINDEX, TROPONINI in the last 168 hours. BNP (last 3 results) No results for input(s): PROBNP in the last 8760 hours. HbA1C: No results for input(s): HGBA1C in the last 72 hours. CBG: Recent Labs  Lab 07/27/19 0848 07/27/19 1140 07/27/19 1628 07/28/19 0524 07/28/19 0721  GLUCAP 82 87 109* 135* 182*   Lipid Profile: No results for input(s): CHOL, HDL, LDLCALC, TRIG, CHOLHDL, LDLDIRECT in the last 72 hours. Thyroid Function Tests: Recent Labs    07/27/19 0227  TSH 8.435*   Anemia Panel: Recent Labs    07/27/19 0227  IRON 66   Sepsis Labs: Recent Labs  Lab 07/21/19 1815 07/21/19 1935 07/21/19 2050 07/22/19 1618 07/22/19 1856 07/28/19 0632  PROCALCITON  --  0.11  --   --   --  0.11  LATICACIDVEN 2.6*  --  2.0* 3.8* 1.5  --     Recent Results (from the past 240 hour(s))  SARS CORONAVIRUS 2 (TAT 6-24 HRS) Nasopharyngeal Nasopharyngeal Swab     Status: None   Collection Time: 07/20/19  8:09 PM   Specimen: Nasopharyngeal Swab  Result Value Ref Range Status   SARS Coronavirus 2 NEGATIVE NEGATIVE Final    Comment: (NOTE) SARS-CoV-2 target nucleic acids are NOT DETECTED. The SARS-CoV-2 RNA is generally detectable in upper and lower respiratory specimens during the acute phase of infection. Negative results do not preclude SARS-CoV-2 infection, do not  rule out co-infections with other pathogens, and should not be used as the sole basis for treatment or other patient management decisions. Negative results must be combined with clinical observations, patient history, and epidemiological information. The expected result is Negative. Fact Sheet for Patients: SugarRoll.be Fact Sheet for Healthcare Providers: https://www.woods-mathews.com/ This test is not yet approved or cleared by the [place] FDA and  has been authorized for detection and/or diagnosis of SARS-CoV-2 by FDA under an Emergency Use Authorization (EUA). This EUA will remain  in effect (meaning this test can be used) for the duration of the COVID-19 declaration under Section 56 4(b)(1) of the Act, 21 U.S.C. section 360bbb-3(b)(1), unless the authorization is terminated or revoked sooner. Performed at [facility], [place] [address] [place], [place] [address]   Culture, blood (x 2)     Status: None   Collection Time: 07/21/19  7:37 PM   Specimen: BLOOD  Result Value Ref Range Status   Specimen Description BLOOD LEFT ANTECUBITAL  Final   Special Requests   Final    BOTTLES DRAWN AEROBIC AND ANAEROBIC Blood Culture adequate volume   Culture   Final    NO GROWTH 5 DAYS Performed at [facility], [address], [place], [place] [address]    Report Status 07/26/2019 FINAL  Final  Culture, blood (x 2)     Status: None   Collection Time: 07/21/19  7:38 PM   Specimen: BLOOD  Result Value Ref Range Status   Specimen Description BLOOD BLOOD LEFT HAND  Final   Special Requests   Final    BOTTLES DRAWN AEROBIC AND ANAEROBIC Blood Culture adequate volume   Culture   Final    NO GROWTH 5 DAYS Performed at [facility], [place], [place]  [address]    Report Status 07/26/2019 FINAL  Final  Urine culture     Status: None   Collection Time: 07/22/19  6:00 AM   Specimen: Urine, Random  Result  Value Ref Range Status   Specimen Description   Final    URINE, RANDOM Performed at [facility], [address], [place], [place] [address]    Special Requests   Final    NONE Performed at [facility], [address] [place], [place] [address]    Culture   Final    NO GROWTH Performed at [facility], [place] [address] [place], [place] [address]    Report Status 07/23/2019 FINAL  Final  MRSA PCR Screening     Status: None   Collection Time: 07/22/19 12:07 PM   Specimen: Nasal Mucosa; Nasopharyngeal  Result Value Ref Range Status   MRSA by PCR NEGATIVE NEGATIVE Final    Comment:        The GeneXpert MRSA Assay (FDA approved for NASAL specimens only), is one component of Kazim Corrales comprehensive MRSA colonization surveillance program. It is not intended to diagnose MRSA infection nor to guide or monitor treatment for MRSA infections. Performed at [facility], [address], [place], [place] [address]          Radiology Studies: [facility] Chest [place] 1 View  Result Date: 07/27/2019 CLINICAL DATA:  Hypoxia. EXAM: PORTABLE CHEST 1 VIEW COMPARISON:  July 25, 2019. FINDINGS: Stable cardiomediastinal silhouette. No pneumothorax is noted. Stable mild loculated left pleural effusion is noted with associated left basilar atelectasis or infiltrate. Stable right lung opacities are noted concerning for atelectasis or possibly infiltrate. Right internal jugular catheter is unchanged. Bony thorax is unremarkable. IMPRESSION: Stable bilateral lung opacities are noted as well as loculated left pleural effusion. Electronically Signed   By: Marijo Conception M.D.   On: 07/27/2019 12:54        Scheduled Meds: . budesonide (PULMICORT) nebulizer solution  0.5 mg Nebulization BID  . chlordiazePOXIDE  25 mg Oral BID  . Chlorhexidine Gluconate Cloth  6 each Topical Daily  . diclofenac Sodium  2 g Topical QID  . enoxaparin (LOVENOX) injection  40 mg Subcutaneous Q24H  .  feeding supplement (ENSURE ENLIVE)  237 mL Oral BID BM  . folic acid  1 mg Oral Daily  . insulin aspart  0-9 Units Subcutaneous TID WC  . ipratropium-albuterol  3 mL Nebulization Q6H  . lidocaine  1 patch Transdermal Q24H  . methylPREDNISolone (SOLU-MEDROL) injection  40 mg Intravenous Q12H  . multivitamin with minerals  1 tablet Oral Daily  . pantoprazole  40 mg Oral Daily  . QUEtiapine  25 mg Oral Daily  . QUEtiapine  50 mg Oral QHS  . rOPINIRole  1 mg Oral q AM  . rOPINIRole  4 mg Oral QHS  . sertraline  150 mg Oral Daily  . thiamine  100 mg Oral Daily  . tiotropium  18 mcg Inhalation Daily   Continuous Infusions: . ceFEPime (MAXIPIME) IV Stopped (07/28/19 1030)     LOS: 7 days    Time spent: over 30 min    Fayrene Helper, MD [facility]   To contact the attending provider between 7A-7P or the covering provider during after hours 7P-7A, please log into the web site [email or web address] and access using universal  password for that web site. If you do not have the password, please call the hospital operator.  07/28/2019, 11:35 AM

## 2019-07-28 NOTE — Progress Notes (Signed)
Occupational Therapy Treatment Patient Details Name: Austin Blevins MRN: [id number] DOB: Oct 02, 1960 Today's Date: 07/28/2019    History of present illness Austin Blevins is a 59 y.o. male with medical history significant for hydrocephalus with known VP shunt, severe COPD with chronic respiratory failure on home O2 at 3 L, anxiety and restless leg syndrome who presented to the emergency room with severe respiratory distress and complaint of left  lateral chest pain and mid to lower back pain. Pain is worse with movement and coughing and is of moderate intensity described as sharp and nonradiating. CTA of his chest which was negative for PE but showed advanced emphysema and stable pulmonary nodules also showed a "mild T7 inferior endplate compression fracture is new." Pt t/f to [facility]09 on 07/22/2019 d/t less intelligible speech. Repeat CT unchanged. Neurology was consulted and felt pt might be delerious. Precedex drip was started that has since been discontinued.   OT comments  Pt seen for OT tx this date to f/u re: safety with self care ADLs/ADL mobility. Pt noted to have increased LE tremors while lying in bed this date and HR appears to be elevated in relation to constant motion. OT checks with RN and attending physician-Angela and Doctor Lowell Guitar, to get input on potentially mobilizing with HR in 120s-130s while lying in bed at this time. Okay'ed light trial to mobilize. Pt able to compelte sup to sit with MOD I with use of bed rail and demos G static sitting EOB. Pt does require some increased assistance with LB dressing this date secondary to LE tremors. Once in standing, pt reports feeling better and tremors noted to dissipate. Pt's HR to 144 with initial stand. OT facilitates pt participation in 5-6 small shuffling side steps from EOB to chair with RW with MIN A. Once in sitting in chair, OT has pt trial some chair push ups to both build tricep strength and to bear weight through LEs. Pt's LEs noted to be  still at this time and pt's HR steadily decreases to 119bpm. Pt left in chair with all essentials in reach and RN aware as she presents to administer medications. Pt reports feeling much calmer while sitting in chair and pain in LEs decreased. Pt also self-reports that HR is typically in 110s at baseline at home for him. Will continue to monitor and follow pt. As pt with increased tremors and requiring increased assistance, this date, OT  Changed d/c recommendation to SNF.    Follow Up Recommendations  SNF    Equipment Recommendations  3 in 1 bedside commode    Recommendations for Other Services      Precautions / Restrictions Precautions Precautions: Fall Restrictions Weight Bearing Restrictions: No Other Position/Activity Restrictions: monitor HR, appears to increase when pt having RLS exacerbation. pt was in 120s-130s while lying in bed d/t leg tremors, RN and attending okay'ed trial of activity, increase to 144bpm with transfer initially, but Upon weightbearing through LEs, pt with decreased tremors and HR was able to decrease to 119 while pt seated in chair which pt self-reports is close to his baseline at home. this information relayed to RN and MD.       Mobility Bed Mobility Overal bed mobility: Modified Independent             General bed mobility comments: use of bed rail  Transfers Overall transfer level: Needs assistance Equipment used: Rolling walker (2 wheeled) Transfers: Sit to/from Stand Sit to Stand: Min assist  General transfer comment: UEs noted to be somewhat weak, pt with weight in heels/posteriorly once up    Balance Overall balance assessment: Needs assistance Sitting-balance support: Feet supported Sitting balance-Leahy Scale: Good     Standing balance support: Bilateral upper extremity supported;During functional activity Standing balance-Leahy Scale: Fair Standing balance comment: requires support of at least one UE in static standing,  somewhat tremulous in UEs in standing. LE tremors improve in standing and pt reports LEs feeling better when standing.                           ADL either performed or assessed with clinical judgement   ADL                       Lower Body Dressing: Moderate assistance Lower Body Dressing Details (indicate cue type and reason): pt requires MOD A to don socks in sitting d/t RLS tremors noted this date.             Functional mobility during ADLs: Minimal assistance;Rolling walker(5-6 shuffling side steps from bed to chair adjacent.)       Vision Baseline Vision/History: Wears glasses Wears Glasses: Reading only Patient Visual Report: No change from baseline     Perception     Praxis      Cognition Arousal/Alertness: Awake/alert Behavior During Therapy: WFL for tasks assessed/performed Overall Cognitive Status: Within Functional Limits for tasks assessed                                 General Comments: pt alert and oriented this date to place, time, and situation, but does endorse having periods when he doesn't remember what happened and endorses having seen visual hallucinations two or three days ago.        Exercises Other Exercises Other Exercises: OT facilitates education with pt re: chair push ups to bear weight through LEs which appears to provide relief for LE tremors and improves tricep strength. Pt demos good form and OT engages pt in two sets x5 reps with MIN verbal cues for pace.   Shoulder Instructions       General Comments      Pertinent Vitals/ Pain       Pain Assessment: Faces Faces Pain Scale: Hurts even more Pain Location: states that LEs hurt when tremoring Pain Descriptors / Indicators: Grimacing;Discomfort Pain Intervention(s): Limited activity within patient's tolerance;Monitored during session;Repositioned(repositioned OOB to reclining chair)  Home Living Family/patient expects to be discharged to::  Private residence Living Arrangements: Parent                                      Prior Functioning/Environment              Frequency  Min 2X/week        Progress Toward Goals  OT Goals(current goals can now be found in the care plan section)  Progress towards OT goals: OT to reassess next treatment  Acute Rehab OT Goals Patient Stated Goal: To return to PLOF OT Goal Formulation: With patient Time For Goal Achievement: 08/04/19 Potential to Achieve Goals: Good  Plan Discharge plan needs to be updated    Co-evaluation                 AM-PAC OT "  6 Clicks" Daily Activity     Outcome Measure   Help from another person eating meals?: None Help from another person taking care of personal grooming?: A Little Help from another person toileting, which includes using toliet, bedpan, or urinal?: A Little Help from another person bathing (including washing, rinsing, drying)?: A Little Help from another person to put on and taking off regular upper body clothing?: A Little Help from another person to put on and taking off regular lower body clothing?: A Lot 6 Click Score: 18    End of Session Equipment Utilized During Treatment: Gait belt;Rolling walker  OT Visit Diagnosis: Repeated falls (R29.6);Other abnormalities of gait and mobility (R26.89);Feeding difficulties (R63.3)   Activity Tolerance Patient tolerated treatment well;Patient limited by fatigue   Patient Left with call bell/phone within reach;in chair;with chair alarm set;Other (comment)   Nurse Communication Mobility status;Other (comment)(notified RN that pt up to chair and that catheter bag drained with contents in urinal for measuring)        Time: 9509-3267 OT Time Calculation (min): 56 min  Charges: OT General Charges $OT Visit: 1 Visit OT Treatments $Self Care/Home Management : 23-37 mins $Therapeutic Activity: 23-37 mins  Gerrianne Scale, MS, OTR/L ascom  [phone] 07/28/19, 7:10 PM

## 2019-07-28 NOTE — Progress Notes (Signed)
The nurse called pt's sister, [name], per her request.

## 2019-07-29 ENCOUNTER — Other Ambulatory Visit: Payer: Self-pay | Admitting: Nurse Practitioner

## 2019-07-29 ENCOUNTER — Encounter: Payer: Self-pay | Admitting: Internal Medicine

## 2019-07-29 LAB — PHOSPHORUS: Phosphorus: 3.6 mg/dL (ref 2.5–4.6)

## 2019-07-29 LAB — CBC WITH DIFFERENTIAL/PLATELET
Abs Immature Granulocytes: 0.09 10*3/uL — ABNORMAL HIGH (ref 0.00–0.07)
Basophils Absolute: 0 10*3/uL (ref 0.0–0.1)
Basophils Relative: 0 %
Eosinophils Absolute: 0.1 10*3/uL (ref 0.0–0.5)
Eosinophils Relative: 1 %
HCT: 38.9 % — ABNORMAL LOW (ref 39.0–52.0)
Hemoglobin: 12.5 g/dL — ABNORMAL LOW (ref 13.0–17.0)
Immature Granulocytes: 1 %
Lymphocytes Relative: 7 %
Lymphs Abs: 0.8 10*3/uL (ref 0.7–4.0)
MCH: 30.9 pg (ref 26.0–34.0)
MCHC: 32.1 g/dL (ref 30.0–36.0)
MCV: 96 fL (ref 80.0–100.0)
Monocytes Absolute: 0.7 10*3/uL (ref 0.1–1.0)
Monocytes Relative: 6 %
Neutro Abs: 10 10*3/uL — ABNORMAL HIGH (ref 1.7–7.7)
Neutrophils Relative %: 85 %
Platelets: 222 10*3/uL (ref 150–400)
RBC: 4.05 MIL/uL — ABNORMAL LOW (ref 4.22–5.81)
RDW: 12.2 % (ref 11.5–15.5)
WBC: 11.6 10*3/uL — ABNORMAL HIGH (ref 4.0–10.5)
nRBC: 0 % (ref 0.0–0.2)

## 2019-07-29 LAB — COMPREHENSIVE METABOLIC PANEL
ALT: 51 U/L — ABNORMAL HIGH (ref 0–44)
AST: 34 U/L (ref 15–41)
Albumin: 3.2 g/dL — ABNORMAL LOW (ref 3.5–5.0)
Alkaline Phosphatase: 74 U/L (ref 38–126)
Anion gap: 7 (ref 5–15)
BUN: 23 mg/dL — ABNORMAL HIGH (ref 6–20)
CO2: 32 mmol/L (ref 22–32)
Calcium: 8.6 mg/dL — ABNORMAL LOW (ref 8.9–10.3)
Chloride: 101 mmol/L (ref 98–111)
Creatinine, Ser: 1.03 mg/dL (ref 0.61–1.24)
GFR calc Af Amer: >60 mL/min (ref >60)
GFR calc non Af Amer: >60 mL/min (ref >60)
Glucose, Bld: 113 mg/dL — ABNORMAL HIGH (ref 70–99)
Potassium: 3.8 mmol/L (ref 3.5–5.1)
Sodium: 140 mmol/L (ref 135–145)
Total Bilirubin: 0.4 mg/dL (ref 0.3–1.2)
Total Protein: 6.1 g/dL — ABNORMAL LOW (ref 6.5–8.1)

## 2019-07-29 LAB — MAGNESIUM: Magnesium: 2.3 mg/dL (ref 1.7–2.4)

## 2019-07-29 LAB — GLUCOSE, CAPILLARY
Glucose-Capillary: 142 mg/dL — ABNORMAL HIGH (ref 70–99)
Glucose-Capillary: 136 mg/dL — ABNORMAL HIGH (ref 70–99)
Glucose-Capillary: 292 mg/dL — ABNORMAL HIGH (ref 70–99)
Glucose-Capillary: 296 mg/dL — ABNORMAL HIGH (ref 70–99)
Glucose-Capillary: 241 mg/dL — ABNORMAL HIGH (ref 70–99)
Glucose-Capillary: 244 mg/dL — ABNORMAL HIGH (ref 70–99)

## 2019-07-29 LAB — FERRITIN: Ferritin: 160 ng/mL (ref 24–336)

## 2019-07-29 LAB — T4, FREE: Free T4: 0.5 ng/dL — ABNORMAL LOW (ref 0.61–1.12)

## 2019-07-29 LAB — TSH: TSH: 2.103 u[IU]/mL (ref 0.350–4.500)

## 2019-07-29 MED ORDER — FENTANYL CITRATE (PF) 100 MCG/2ML IJ SOLN
25.0000 ug | Freq: Four times a day (QID) | INTRAMUSCULAR | Status: DC | PRN
  Administered 2019-07-29: 25 ug via INTRAVENOUS
  Administered 2019-07-29 – 2019-07-30 (×4): 50 ug via INTRAVENOUS
  Filled 2019-07-29 (×5): qty 2

## 2019-07-29 MED ORDER — GABAPENTIN 300 MG PO CAPS
300.0000 mg | ORAL_CAPSULE | Freq: Every day | ORAL | Status: DC
  Administered 2019-07-29: 300 mg via ORAL
  Filled 2019-07-29: qty 1

## 2019-07-29 MED ORDER — PREDNISONE 10 MG PO TABS: 10.0000 mg | ORAL_TABLET | Freq: Every day | ORAL | Status: DC

## 2019-07-29 MED ORDER — PREDNISONE 20 MG PO TABS
30.0000 mg | ORAL_TABLET | Freq: Every day | ORAL | Status: AC
  Administered 2019-08-01 – 2019-08-02 (×2): 30 mg via ORAL
  Filled 2019-07-29 (×2): qty 1

## 2019-07-29 MED ORDER — GABAPENTIN 100 MG PO CAPS
100.0000 mg | ORAL_CAPSULE | Freq: Once | ORAL | Status: AC
  Administered 2019-07-29: 100 mg via ORAL
  Filled 2019-07-29: qty 1

## 2019-07-29 MED ORDER — NICOTINE 21 MG/24HR TD PT24
21.0000 mg | MEDICATED_PATCH | Freq: Every day | TRANSDERMAL | Status: DC
  Administered 2019-07-29 – 2019-08-02 (×5): 21 mg via TRANSDERMAL
  Filled 2019-07-29 (×5): qty 1

## 2019-07-29 MED ORDER — IPRATROPIUM-ALBUTEROL 0.5-2.5 (3) MG/3ML IN SOLN
3.0000 mL | Freq: Three times a day (TID) | RESPIRATORY_TRACT | Status: DC
  Administered 2019-07-29 – 2019-08-02 (×12): 3 mL via RESPIRATORY_TRACT
  Filled 2019-07-29 (×11): qty 3

## 2019-07-29 MED ORDER — PREDNISONE 20 MG PO TABS: 20.0000 mg | ORAL_TABLET | Freq: Every day | ORAL | Status: DC

## 2019-07-29 MED ORDER — PREDNISONE 20 MG PO TABS
40.0000 mg | ORAL_TABLET | Freq: Every day | ORAL | Status: AC
  Administered 2019-07-30 – 2019-07-31 (×2): 40 mg via ORAL
  Filled 2019-07-29 (×2): qty 2

## 2019-07-29 NOTE — Progress Notes (Addendum)
PROGRESS NOTE    Austin Blevins  MVH:[id number] DOB: 1960-05-31 DOA: 07/20/2019 PCP: [name], NP  Brief Narrative:  Austin Blevins Austin Blevins 59 y.o.malewith medical history significant forhydrocephalus with known VP shunt, severe COPD with chronic respiratory failure on home O2 at 3 L, anxiety and restless leg syndrome who presented to the emergency room with severe respiratory distress and complaint ofleftlateral chest pain and mid to lowerback pain. Symptoms have been ongoing for 4 days. Pain is worse with movement and coughing and is of moderate intensity described as sharp and nonradiating. He has no associated nausea vomiting or diaphoresis. Shortness of breath worsensthechest pain. He denies Austin Blevins fall. He denies fever, chills, abdominal pain, nausea vomiting or change in bowel movement.He saw his pulmonologist Austin Blevins few days prior for follow-up and received Austin Buccellato prescription for steroid taper pack and tramadol for the back pain.  On arrival to the emergency room, per ED providerhe was initially very tachypneic at 30, using accessory muscles to breathe. O2 sat maintained at 90% on his home flow rate at 3 L. He was also tachycardic at 134 but afebrile. BP 172/86. On his blood work, BNP 44 troponin less than 2, white cell count 12,500 with otherwise unremarkable blood work. EKG showed no acute ST-T wave changes. He had [name] CTA of his chest which was negative for PE but showed advanced emphysema and stable pulmonary nodules also showed Austin Blevins new T7 fracture. Patient was given DuoNeb treatments in the emergency room as well as IV steroids with significant improvement in his breathing. Hospitalist consulted for admission.  The patient has been admitted to Austin Blevins telemetry bed. He is receiving steroids, antibiotics and nebulizer treatments. He is also receiving pain control. Sinus tachycardia has been addressed with ativan.   On 07/21/2019 the patient had increased heart rate and increased oxygen  requirements. CXR demonstrated Austin Blevins small left pleural effusion and emphysema. No change from previous. EKG demonstrated sinus tachycardia which was addressed with ativan.   On 07/22/2019 his wife expressed to nursing that the patient's tremors were worse than usual and that his speech was less intelligible than normal. CT head was performed and was unchanged from previous. Neurology was consulted. They felt that the patient was delerious and recommended antipsychotic for sleep. The patient was transferred to the ICU and started on Austin Blevins precedex drip. This has been able to be weaned to off. Mental status is improved. IV fluid bolus has been given for hypotension on the afternoon of 07/23/2019.  The patient has been transitioned off of precedex to librium. He is calmer. Heart rate and RR are improved, although he is somewhat lethargic.  Assessment & Plan:   Principal Problem:   COPD with acute exacerbation (Austin Blevins) Active Problems:   Chronic respiratory failure with hypoxia (HCC)   Pulmonary nodules   Chest pain at rest   Thoracic vertebral fracture (HCC)   COPD exacerbation (HCC)   Visual hallucinations  Acute on chronic respiratory failure with hypoxia (HCC)  COPD Exacerbation  COPD on 2 - 3 L at baseline (on 2 L at rest and 3 L with activity): The patient uses O2 at 3L by Austin Blevins at home chronically. Currently requiring 4 L.  He'd been treated with steroids and pain meds by his pulmonologist prior to admission.  - wheezing improved today on steroids  - continue antibiotics  - IS, flutter   - repeat CXR 3/11 notable for bilateral lung opacities with mild loculated L effusion (discussed with rads who noted  this appeared chronic - noted on 03/2018 imaging)  Acute Metabolic Encephalopathy  Delirium:  On 3/6 pt with worsening tremors, difficult to understand speech.  Negative head CT.  Thought to be delirious at that time.  Overnight 3/10-11 he had increasing restlessness and restless leg symptoms,  encephalopathic again.  ? If this was related to requip dose.  Was restarted on precedex at this time, now d/c'd.  Continues to be encephalopathic with lethargy today.  - Decrease librium to nightly  - Continue seroquel (50 mg qhs and 25 mg qam)  - Continue ropinirole for restless legs, which is worsening delirium (will increase to 4 mg at bedtime -> follow hallucinations, maybe worsened with this)  - appreciate neurology input -> suspected 2/2 delirium in setting of poor oxygenation -> seroquel if needed  - psychiatry has seen him, discontinued trazodone and buspar - seroquel adjusted  - s/p precedex, now on librium as above.  Suggestion of benzo withdrawal from previous note, but I don't see this in home medlist or PMP aware.  Apparently takes meds from friends at times (unknown quantity - ? Valium, percocets). Will start to taper librium.  Will discuss with sister (sounds like he's not taking benzos that consistently?).   Sinus Tachycardia: intermittent, maybe related to discomfort with RLS?  Will continue to monitor.  TSH wnl, free T4 slightly low.  Follow echo  Visual hallucinations: Likely due to Shalissa Easterwood medication, ? Benzo withdrawal. Home medications that may be to blame include Hycodan, requip, Zoloft, or buspar. Also takes unknown quantity of ativan and medications that he gets from his friends occasionally.   - appreciate psych eval  Hypotension: Resolved.  Pulmonary nodules: CTA chest demonstrated unchanged bilateral pulmonary nodules with the largest in the right lower lobe. Follow is recommended as per previous recommendations per radiology.  Abdominal distention: Abdominal film raised Zackarey Holleman question of perigastric pneumoperitoniem. CT abdomen was performed which showed no obstruction, moderate stool load throughout the colonic confines, and no pneumoperitoneum.   Chest pain: Seems to be primarily with cough.  EKG without ischemic changes at presentation and negative troponins x 4.    -  continue hycodan for cough  - voltaren, lidocaine patch    Acute T7 vertebral body fracture: Most certainly largely to blame for the patient's discomfort, but also possibly Kyannah Climer consequence of the patient's cough due to acute bronchitis and his chronic on and off steroid use for severe COPD. Doubt that this fracture and the patient's osteopenic spine would be appropriate for kyphoplasty. Will add toradol to pain control and consult PT/OT for mobility.  Chronic tremors:  Chronic. Improving. Neurology does not see anything new in CT head.  Follow outpatient with neurology.  Anxiety: Continue BuSpar  Hydrocephalus with history of VP shunt: No acute concerns at this time related to the hydrocephalus itself.  Severe protein calorie malnutrition: Nutrition consulted. Supplements ordered.  Abnormal TSH - follow free T4  DVT prophylaxis: lovenox Code Status: DNR Family Communication: none currently at bedside - sister 3/13 Disposition Plan:  . Patient came from: home            . Anticipated d/c place: SNF . Barriers to d/c OR conditions which need to be met to effect Yanni Ruberg safe d/c: improvement in pain control, respiratory status, placement for SNF   Consultants:   Neurology  Critical care  Procedures:  none  Antimicrobials: Anti-infectives (From admission, onward)   Start     Dose/Rate Route Frequency Ordered Stop   07/27/19  1900  ceFEPIme (MAXIPIME) 2 g in sodium chloride 0.9 % 100 mL IVPB     2 g 200 mL/hr over 30 Minutes Intravenous Every 8 hours 07/27/19 1820     07/22/19 2200  vancomycin (VANCOREADY) IVPB 1250 mg/250 mL  Status:  Discontinued     1,250 mg 166.7 mL/hr over 90 Minutes Intravenous Every 24 hours 07/21/19 2119 07/22/19 1657   07/21/19 2200  piperacillin-tazobactam (ZOSYN) IVPB 3.375 g  Status:  Discontinued     3.375 g 12.5 mL/hr over 240 Minutes Intravenous Every 8 hours 07/21/19 2117 07/22/19 1657   07/21/19 2130  vancomycin (VANCOREADY) IVPB 1250 mg/250 mL       1,250 mg 166.7 mL/hr over 90 Minutes Intravenous  Once 07/21/19 2117 07/22/19 0120   07/20/19 1830  azithromycin (ZITHROMAX) 500 mg in sodium chloride 0.9 % 250 mL IVPB     500 mg 250 mL/hr over 60 Minutes Intravenous  Once 07/20/19 1824 07/20/19 1956     Subjective: Shamar Kracke&Ox3 No new complaints  Objective: Vitals:   07/29/19 0600 07/29/19 0615 07/29/19 0630 07/29/19 0746  BP: (!) 125/98 (!) 132/95 (!) 136/95   Pulse: 93 92 93 95  Resp: (!) 24 (!) 21 (!) 21 20  Temp:      TempSrc:      SpO2: 100% 100% 97% 96%  Weight:      Height:        Intake/Output Summary (Last 24 hours) at 07/29/2019 0834 Last data filed at 07/29/2019 0314 Gross per 24 hour  Intake 684.84 ml  Output 2025 ml  Net -1340.16 ml   Filed Weights   07/20/19 1652 07/29/19 0500  Weight: 55.3 kg 56.3 kg    Examination:  General: No acute distress. Cardiovascular: Heart sounds show Dachelle Molzahn regular rate, and rhythm. Lungs: coarse breath sounds bilaterally Abdomen: Soft, nontender, nondistended  Neurological: Alert and oriented 3. Moves all extremities 4. Cranial nerves II through XII grossly intact. Skin: Warm and dry. No rashes or lesions. Extremities: No clubbing or cyanosis. No edema.   Data Reviewed: I have personally reviewed following labs and imaging studies  CBC: Recent Labs  Lab 07/24/19 0436 07/26/19 0407 07/27/19 0227 07/28/19 0632 07/29/19 0505  WBC 7.9 8.2 11.6* 10.3 11.6*  NEUTROABS 6.1  --  9.5* 9.3* 10.0*  HGB 11.9* 11.7* 13.0 12.8* 12.5*  HCT 38.0* 37.0* 41.4 40.4 38.9*  MCV 98.4 97.1 97.2 95.3 96.0  PLT 182 182 243 208 175   Basic Metabolic Panel: Recent Labs  Lab 07/23/19 0519 07/23/19 0519 07/24/19 0436 07/26/19 0407 07/27/19 0227 07/28/19 0632 07/29/19 0505  NA 139   < > 140 140 142 138 140  K 4.2   < > 3.9 3.9 4.5 4.5 3.8  CL 97*   < > 103 102 104 101 101  CO2 33*   < > 33* 32 32 28 32  GLUCOSE 122*   < > 77 116* 95 196* 113*  BUN 22*   < > 21* 23* 21* 20 23*   CREATININE 0.92   < > 1.01 1.01 0.98 1.01 1.03  CALCIUM 8.4*   < > 8.6* 8.4* 9.0 8.5* 8.6*  MG 2.6*  --  2.3  --  1.9 2.1 2.3  PHOS 3.3  --  4.0  --  4.6 4.5 3.6   < > = values in this interval not displayed.   GFR: Estimated Creatinine Clearance: 63 mL/min (by C-G formula based on SCr of 1.03 mg/dL). Liver Function Tests:  Recent Labs  Lab 07/22/19 1903 07/27/19 0227 07/28/19 0632 07/29/19 0505  AST '19 24 28 ' 34  ALT 21 37 40 51*  ALKPHOS 69 76 73 74  BILITOT 0.4 0.6 0.6 0.4  PROT 6.4* 6.5 6.1* 6.1*  ALBUMIN 3.5 3.4* 3.0* 3.2*   No results for input(s): LIPASE, AMYLASE in the last 168 hours. No results for input(s): AMMONIA in the last 168 hours. Coagulation Profile: No results for input(s): INR, PROTIME in the last 168 hours. Cardiac Enzymes: No results for input(s): CKTOTAL, CKMB, CKMBINDEX, TROPONINI in the last 168 hours. BNP (last 3 results) No results for input(s): PROBNP in the last 8760 hours. HbA1C: No results for input(s): HGBA1C in the last 72 hours. CBG: Recent Labs  Lab 07/28/19 1926 07/28/19 2045 07/28/19 2350 07/29/19 0335 07/29/19 0749  GLUCAP 181* 205* 287* 142* 136*   Lipid Profile: No results for input(s): CHOL, HDL, LDLCALC, TRIG, CHOLHDL, LDLDIRECT in the last 72 hours. Thyroid Function Tests: Recent Labs    07/27/19 0227  TSH 8.435*   Anemia Panel: Recent Labs    07/27/19 0227  IRON 66   Sepsis Labs: Recent Labs  Lab 07/22/19 1618 07/22/19 1856 07/28/19 0632  PROCALCITON  --   --  0.11  LATICACIDVEN 3.8* 1.5  --     Recent Results (from the past 240 hour(s))  SARS CORONAVIRUS 2 (TAT 6-24 HRS) Nasopharyngeal Nasopharyngeal Swab     Status: None   Collection Time: 07/20/19  8:09 PM   Specimen: Nasopharyngeal Swab  Result Value Ref Range Status   SARS Coronavirus 2 NEGATIVE NEGATIVE Final    Comment: (NOTE) SARS-CoV-2 target nucleic acids are NOT DETECTED. The SARS-CoV-2 RNA is generally detectable in upper and  lower respiratory specimens during the acute phase of infection. Negative results do not preclude SARS-CoV-2 infection, do not rule out co-infections with other pathogens, and should not be used as the sole basis for treatment or other patient management decisions. Negative results must be combined with clinical observations, patient history, and epidemiological information. The expected result is Negative. Fact Sheet for Patients: SugarRoll.be Fact Sheet for Healthcare Providers: https://www.woods-mathews.com/ This test is not yet approved or cleared by the [place] FDA and  has been authorized for detection and/or diagnosis of SARS-CoV-2 by FDA under an Emergency Use Authorization (EUA). This EUA will remain  in effect (meaning this test can be used) for the duration of the COVID-19 declaration under Section 56 4(b)(1) of the Act, 21 U.S.C. section 360bbb-3(b)(1), unless the authorization is terminated or revoked sooner. Performed at [facility], [place] [address], [place], [place] [address]   Culture, blood (x 2)     Status: None   Collection Time: 07/21/19  7:37 PM   Specimen: BLOOD  Result Value Ref Range Status   Specimen Description BLOOD LEFT ANTECUBITAL  Final   Special Requests   Final    BOTTLES DRAWN AEROBIC AND ANAEROBIC Blood Culture adequate volume   Culture   Final    NO GROWTH 5 DAYS Performed at [facility], [address] [place], [place] [address]    Report Status 07/26/2019 FINAL  Final  Culture, blood (x 2)     Status: None   Collection Time: 07/21/19  7:38 PM   Specimen: BLOOD  Result Value Ref Range Status   Specimen Description BLOOD BLOOD LEFT HAND  Final   Special Requests   Final    BOTTLES DRAWN AEROBIC AND ANAEROBIC Blood Culture adequate volume  Culture   Final    NO GROWTH 5 DAYS Performed at [facility], [address] [place], [place] [address]    Report Status  07/26/2019 FINAL  Final  Urine culture     Status: None   Collection Time: 07/22/19  6:00 AM   Specimen: Urine, Random  Result Value Ref Range Status   Specimen Description   Final    URINE, RANDOM Performed at [facility], [address], [place], [place] [address]    Special Requests   Final    NONE Performed at [facility], [address], [place], [place] [address]    Culture   Final    NO GROWTH Performed at [facility], [place] [address], [place], [place] [address]    Report Status 07/23/2019 FINAL  Final  MRSA PCR Screening     Status: None   Collection Time: 07/22/19 12:07 PM   Specimen: Nasal Mucosa; Nasopharyngeal  Result Value Ref Range Status   MRSA by PCR NEGATIVE NEGATIVE Final    Comment:        The GeneXpert MRSA Assay (FDA approved for NASAL specimens only), is one component of Arlene Brickel comprehensive MRSA colonization surveillance program. It is not intended to diagnose MRSA infection nor to guide or monitor treatment for MRSA infections. Performed at [facility], [address], [place], [place] [address]          Radiology Studies: [facility] Chest [place] 1 View  Result Date: 07/27/2019 CLINICAL DATA:  Hypoxia. EXAM: PORTABLE CHEST 1 VIEW COMPARISON:  July 25, 2019. FINDINGS: Stable cardiomediastinal silhouette. No pneumothorax is noted. Stable mild loculated left pleural effusion is noted with associated left basilar atelectasis or infiltrate. Stable right lung opacities are noted concerning for atelectasis or possibly infiltrate. Right internal jugular catheter is unchanged. Bony thorax is unremarkable. IMPRESSION: Stable bilateral lung opacities are noted as well as loculated left pleural effusion. Electronically Signed   By: Marijo Conception M.D.   On: 07/27/2019 12:54        Scheduled Meds: . budesonide (PULMICORT) nebulizer solution  0.5 mg Nebulization BID  . chlordiazePOXIDE  25 mg Oral QHS  . Chlorhexidine Gluconate  Cloth  6 each Topical Daily  . diclofenac Sodium  2 g Topical QID  . enoxaparin (LOVENOX) injection  40 mg Subcutaneous Q24H  . feeding supplement (ENSURE ENLIVE)  237 mL Oral BID BM  . folic acid  1 mg Oral Daily  . insulin aspart  0-9 Units Subcutaneous Q4H  . ipratropium-albuterol  3 mL Nebulization Q6H  . lidocaine  1 patch Transdermal Q24H  . methylPREDNISolone (SOLU-MEDROL) injection  40 mg Intravenous Q12H  . multivitamin with minerals  1 tablet Oral Daily  . pantoprazole  40 mg Oral Daily  . [START ON 07/30/2019] predniSONE  40 mg Oral Q breakfast   Followed by  . [START ON 08/01/2019] predniSONE  30 mg Oral Q breakfast   Followed by  . [START ON 08/03/2019] predniSONE  20 mg Oral Q breakfast   Followed by  . [START ON 08/05/2019] predniSONE  10 mg Oral Q breakfast  . QUEtiapine  25 mg Oral Daily  . QUEtiapine  50 mg Oral QHS  . rOPINIRole  1 mg Oral q AM  . rOPINIRole  4 mg Oral QHS  . sertraline  150 mg Oral Daily  . thiamine  100 mg Oral Daily  . tiotropium  18 mcg Inhalation Daily   Continuous Infusions: .  ceFEPime (MAXIPIME) IV Stopped (07/29/19 0115)     LOS: 8 days    Time spent: over 73 min    Fayrene Helper, MD [facility]   To contact the attending provider between 7A-7P or the covering provider during after hours 7P-7A, please log into the web site [email or web address] and access using universal [place] password for that web site. If you do not have the password, please call the hospital operator.  07/29/2019, 8:34 AM

## 2019-07-29 NOTE — Telephone Encounter (Signed)
Requested Prescriptions  Pending Prescriptions Disp Refills  . busPIRone (BUSPAR) 10 MG tablet [Pharmacy Med Name: BUSPIRONE HCL 10 MG TAB] 180 tablet 0    Sig: TAKE ONE TABLET TWICE DAILY     Psychiatry: Anxiolytics/Hypnotics - Non-controlled Passed - 07/29/2019  9:08 AM      Passed - Valid encounter within last 6 months    Recent Outpatient Visits          3 weeks ago Depression, major, recurrent, moderate ([place])   [place], Jolene T, NP   2 months ago Stage 3 severe COPD by GOLD classification ([place])   [place], Jolene T, NP   3 months ago Stage 3 severe COPD by GOLD classification ([place])   [place], Jolene T, NP   4 months ago Depression, major, recurrent, moderate ([place])   [place], Jolene T, NP   5 months ago Stage 3 severe COPD by GOLD classification ([place])   [place], [name], NP      Future Appointments            In 6 days [name], [place] T, NP [employer], [facility]           . montelukast (SINGULAIR) 10 MG tablet [Pharmacy Med Name: MONTELUKAST SODIUM 10 MG TAB] 30 tablet 3    Sig: TAKE ONE TABLET AT BEDTIME     Pulmonology:  Leukotriene Inhibitors Passed - 07/29/2019  9:08 AM      Passed - Valid encounter within last 12 months    Recent Outpatient Visits          3 weeks ago Depression, major, recurrent, moderate ([place])   [place], Jolene T, NP   2 months ago Stage 3 severe COPD by GOLD classification ([place])   [place], Jolene T, NP   3 months ago Stage 3 severe COPD by GOLD classification ([place])   [place], Jolene T, NP   4 months ago Depression, major, recurrent, moderate ([place])   [place], Jolene T, NP   5 months ago Stage 3 severe COPD by GOLD classification ([place])   [place], [name], NP      Future Appointments           In 6 days [name], [name], NP [employer], [facility]           . rOPINIRole (REQUIP) 0.5 MG tablet [Pharmacy Med Name: ROPINIROLE HCL 0.5 MG TAB] 120 tablet 0    Sig: TAKE ONE TABLET EVERY MORNING AND TAKE THREE TABLETS AT BEDTIME AS NEEDED FOR RESTLESS LEG SYNDROME     Neurology:  Parkinsonian Agents Failed - 07/29/2019  9:08 AM      Failed - Last BP in normal range    BP Readings from Last 1 Encounters:  07/29/19 (!) 136/95         Passed - Valid encounter within last 12 months    Recent Outpatient Visits          3 weeks ago Depression, major, recurrent, moderate ([place])   [place], Jolene T, NP   2 months ago Stage 3 severe COPD by GOLD classification ([place])   [place], Jolene T, NP   3 months ago Stage 3 severe COPD by GOLD classification ([place])   [place], [place]arnov T, NP   4 months ago Depression,  major, recurrent, moderate (HCC)   [facility] [name], [name] T, NP   5 months ago Stage 3 severe COPD by GOLD classification (HCC)   [facility] [place], [name], NP      Future Appointments            In 6 days [name], [name], NP [employer] [facility], [facility]

## 2019-07-29 NOTE — Progress Notes (Signed)
Pt has remained alert and oriented with c/o achy, lower/mid back pain with radiation to lower abdomen 7/10 -> MD made aware -> PRN fentanyl q6. Pt with c/o RLS -> MD made aware -> gabapentin trial started. Pt has maintained on 4LNC with SpO2 > 90%, lung sounds diminished to auscultation. Pt has remained in ST on cardiac monitor, HR up to 140 during RLS spasms today -> HR trended down to 120s.

## 2019-07-30 LAB — CBC WITH DIFFERENTIAL/PLATELET
Abs Immature Granulocytes: 0.13 10*3/uL — ABNORMAL HIGH (ref 0.00–0.07)
Basophils Absolute: 0 10*3/uL (ref 0.0–0.1)
Basophils Relative: 0 %
Eosinophils Absolute: 0 10*3/uL (ref 0.0–0.5)
Eosinophils Relative: 0 %
HCT: 36.7 % — ABNORMAL LOW (ref 39.0–52.0)
Hemoglobin: 11.8 g/dL — ABNORMAL LOW (ref 13.0–17.0)
Immature Granulocytes: 1 %
Lymphocytes Relative: 7 %
Lymphs Abs: 0.8 10*3/uL (ref 0.7–4.0)
MCH: 31 pg (ref 26.0–34.0)
MCHC: 32.2 g/dL (ref 30.0–36.0)
MCV: 96.3 fL (ref 80.0–100.0)
Monocytes Absolute: 0.5 10*3/uL (ref 0.1–1.0)
Monocytes Relative: 4 %
Neutro Abs: 10.5 10*3/uL — ABNORMAL HIGH (ref 1.7–7.7)
Neutrophils Relative %: 88 %
Platelets: 206 10*3/uL (ref 150–400)
RBC: 3.81 MIL/uL — ABNORMAL LOW (ref 4.22–5.81)
RDW: 12.4 % (ref 11.5–15.5)
WBC: 12 10*3/uL — ABNORMAL HIGH (ref 4.0–10.5)
nRBC: 0 % (ref 0.0–0.2)

## 2019-07-30 LAB — COMPREHENSIVE METABOLIC PANEL
ALT: 92 U/L — ABNORMAL HIGH (ref 0–44)
AST: 47 U/L — ABNORMAL HIGH (ref 15–41)
Albumin: 2.9 g/dL — ABNORMAL LOW (ref 3.5–5.0)
Alkaline Phosphatase: 72 U/L (ref 38–126)
Anion gap: 6 (ref 5–15)
BUN: 24 mg/dL — ABNORMAL HIGH (ref 6–20)
CO2: 35 mmol/L — ABNORMAL HIGH (ref 22–32)
Calcium: 8.9 mg/dL (ref 8.9–10.3)
Chloride: 99 mmol/L (ref 98–111)
Creatinine, Ser: 0.94 mg/dL (ref 0.61–1.24)
GFR calc Af Amer: >60 mL/min (ref >60)
GFR calc non Af Amer: >60 mL/min (ref >60)
Glucose, Bld: 153 mg/dL — ABNORMAL HIGH (ref 70–99)
Potassium: 4.5 mmol/L (ref 3.5–5.1)
Sodium: 140 mmol/L (ref 135–145)
Total Bilirubin: 0.5 mg/dL (ref 0.3–1.2)
Total Protein: 5.8 g/dL — ABNORMAL LOW (ref 6.5–8.1)

## 2019-07-30 LAB — GLUCOSE, CAPILLARY
Glucose-Capillary: 133 mg/dL — ABNORMAL HIGH (ref 70–99)
Glucose-Capillary: 144 mg/dL — ABNORMAL HIGH (ref 70–99)
Glucose-Capillary: 225 mg/dL — ABNORMAL HIGH (ref 70–99)
Glucose-Capillary: 299 mg/dL — ABNORMAL HIGH (ref 70–99)
Glucose-Capillary: 211 mg/dL — ABNORMAL HIGH (ref 70–99)

## 2019-07-30 LAB — HEPATITIS PANEL, ACUTE
HCV Ab: NONREACTIVE
Hep A IgM: NONREACTIVE
Hep B C IgM: NONREACTIVE
Hepatitis B Surface Ag: NONREACTIVE

## 2019-07-30 LAB — PHOSPHORUS: Phosphorus: 2.8 mg/dL (ref 2.5–4.6)

## 2019-07-30 LAB — MAGNESIUM: Magnesium: 2.1 mg/dL (ref 1.7–2.4)

## 2019-07-30 MED ORDER — METOPROLOL TARTRATE 5 MG/5ML IV SOLN
2.5000 mg | Freq: Four times a day (QID) | INTRAVENOUS | Status: DC | PRN
  Administered 2019-07-30: 18:00:00 2.5 mg via INTRAVENOUS
  Filled 2019-07-30: qty 5

## 2019-07-30 MED ORDER — GABAPENTIN 300 MG PO CAPS
300.0000 mg | ORAL_CAPSULE | Freq: Two times a day (BID) | ORAL | Status: DC
  Administered 2019-07-30 – 2019-07-31 (×3): 300 mg via ORAL
  Filled 2019-07-30 (×3): qty 1

## 2019-07-30 MED ORDER — SODIUM CHLORIDE 0.9 % IV SOLN
2.0000 g | INTRAVENOUS | Status: AC
  Administered 2019-07-30 – 2019-08-02 (×4): 2 g via INTRAVENOUS
  Filled 2019-07-30: qty 2
  Filled 2019-07-30 (×3): qty 20
  Filled 2019-07-30 (×2): qty 2

## 2019-07-30 MED ORDER — INSULIN ASPART 100 UNIT/ML ~~LOC~~ SOLN
0.0000 [IU] | Freq: Three times a day (TID) | SUBCUTANEOUS | Status: DC
  Administered 2019-07-31: 13:00:00 5 [IU] via SUBCUTANEOUS
  Administered 2019-07-31: 8 [IU] via SUBCUTANEOUS
  Administered 2019-08-01 (×2): 5 [IU] via SUBCUTANEOUS
  Administered 2019-08-02: 8 [IU] via SUBCUTANEOUS
  Filled 2019-07-30 (×5): qty 1

## 2019-07-30 MED ORDER — INSULIN DETEMIR 100 UNIT/ML ~~LOC~~ SOLN
5.0000 [IU] | Freq: Two times a day (BID) | SUBCUTANEOUS | Status: DC
  Administered 2019-07-30 – 2019-08-02 (×6): 5 [IU] via SUBCUTANEOUS
  Filled 2019-07-30 (×9): qty 0.05

## 2019-07-30 MED ORDER — METOPROLOL TARTRATE 25 MG PO TABS
12.5000 mg | ORAL_TABLET | Freq: Two times a day (BID) | ORAL | Status: DC
  Administered 2019-07-30 – 2019-08-02 (×6): 12.5 mg via ORAL
  Filled 2019-07-30 (×6): qty 1

## 2019-07-30 MED ORDER — FENTANYL CITRATE (PF) 100 MCG/2ML IJ SOLN
25.0000 ug | Freq: Three times a day (TID) | INTRAMUSCULAR | Status: DC | PRN
  Administered 2019-07-30 – 2019-07-31 (×2): 50 ug via INTRAVENOUS
  Filled 2019-07-30 (×2): qty 2

## 2019-07-30 MED ORDER — INSULIN ASPART 100 UNIT/ML ~~LOC~~ SOLN
0.0000 [IU] | Freq: Every day | SUBCUTANEOUS | Status: DC
  Administered 2019-07-30: 23:00:00 2 [IU] via SUBCUTANEOUS
  Administered 2019-07-31: 23:00:00 3 [IU] via SUBCUTANEOUS
  Administered 2019-08-01: 21:00:00 2 [IU] via SUBCUTANEOUS
  Filled 2019-07-30 (×3): qty 1

## 2019-07-30 NOTE — Progress Notes (Signed)
PROGRESS NOTE    Austin Blevins  RVI:[id number] DOB: 1960/10/21 DOA: 07/20/2019 PCP: [name], NP  Brief Narrative:  Austin Blevins Austin Blevins 59 y.o.malewith medical history significant forhydrocephalus with known VP shunt, severe COPD with chronic respiratory failure on home O2 at 3 L, anxiety and restless leg syndrome who presented to the emergency room with severe respiratory distress and complaint ofleftlateral chest pain and mid to lowerback pain. Symptoms have been ongoing for 4 days. Pain is worse with movement and coughing and is of moderate intensity described as sharp and nonradiating. He has no associated nausea vomiting or diaphoresis. Shortness of breath worsensthechest pain. He denies Austin Blevins fall. He denies fever, chills, abdominal pain, nausea vomiting or change in bowel movement.He saw his pulmonologist Austin Blevins few days prior for follow-up and received Austin Blevins prescription for steroid taper pack and tramadol for the back pain.  On arrival to the emergency room, per ED providerhe was initially very tachypneic at 30, using accessory muscles to breathe. O2 sat maintained at 90% on his home flow rate at 3 L. He was also tachycardic at 134 but afebrile. BP 172/86. On his blood work, BNP 44 troponin less than 2, white cell count 12,500 with otherwise unremarkable blood work. EKG showed no acute ST-T wave changes. He had Austin Blevins CTA of his chest which was negative for PE but showed advanced emphysema and stable pulmonary nodules also showed Austin Blevins new T7 fracture. Patient was given DuoNeb treatments in the emergency room as well as IV steroids with significant improvement in his breathing. Hospitalist consulted for admission.  The patient has been admitted to [facility] telemetry bed. He is receiving steroids, antibiotics and nebulizer treatments. He is also receiving pain control. Sinus tachycardia has been addressed with ativan.   On 07/21/2019 the patient had increased heart rate and increased oxygen  requirements. CXR demonstrated Austin Blevins small left pleural effusion and emphysema. No change from previous. EKG demonstrated sinus tachycardia which was addressed with ativan.   On 07/22/2019 his wife expressed to nursing that the patient's tremors were worse than usual and that his speech was less intelligible than normal. CT head was performed and was unchanged from previous. Neurology was consulted. They felt that the patient was delerious and recommended antipsychotic for sleep. The patient was transferred to the ICU and started on Venetta Knee precedex drip. This has been able to be weaned to off. Mental status is improved. IV fluid bolus has been given for hypotension on the afternoon of 07/23/2019.  The patient has been transitioned off of precedex to librium. He is calmer. Heart rate and RR are improved, although he is somewhat lethargic.  Assessment & Plan:   Principal Problem:   COPD with acute exacerbation (Austin Blevins) Active Problems:   Chronic respiratory failure with hypoxia (HCC)   Pulmonary nodules   Chest pain at rest   Thoracic vertebral fracture (HCC)   COPD exacerbation (HCC)   Visual hallucinations  Acute on chronic respiratory failure with hypoxia (HCC)  COPD Exacerbation  COPD on 2 - 3 L at baseline (on 2 L at rest and 3 L with activity): The patient uses O2 at 3L by [place] at home chronically. Currently requiring 4 L.  He'd been treated with steroids and pain meds by his pulmonologist prior to admission.  - wheezing improved today on steroids (taper to home dose)  - continue antibiotics -> narrow  - IS, flutter   - repeat CXR 3/11 notable for bilateral lung opacities with mild loculated L  effusion (discussed with rads who noted this appeared chronic - noted on 03/2018 imaging)  Acute Metabolic Encephalopathy  Delirium:  On 3/6 pt with worsening tremors, difficult to understand speech.  Negative head CT.  Thought to be delirious at that time.  Overnight 3/10-11 he had increasing restlessness and  restless leg symptoms, encephalopathic again.  ? If this was related to requip dose.  Was restarted on precedex at this time, now d/c'd.  Continues to be encephalopathic with lethargy today.  - Stop librium  - Continue seroquel (50 mg qhs and 25 mg qam)  - Continue ropinirole for restless legs, which is worsening delirium (will increase to 4 mg at bedtime -> follow hallucinations, maybe worsened with this)  - gabapentin for RLS  - appreciate neurology input -> suspected 2/2 delirium in setting of poor oxygenation -> seroquel if needed  - psychiatry has seen him, discontinued trazodone and buspar - seroquel adjusted  - s/p precedex, now on librium as above.  Suggestion of benzo withdrawal from previous note, but I don't see this in home medlist or PMP aware.  Apparently takes meds from friends at times (unknown quantity - ? Valium, percocets). Will start to taper librium.  Will discuss with sister (sounds like he's not taking benzos that consistently?).   Sinus Tachycardia: intermittent, maybe related to discomfort with RLS?  Will continue to monitor.  TSH wnl, free T4 slightly low.  Follow echo  Visual hallucinations: Likely due to Austin Blevins medication, ? Benzo withdrawal. Home medications that may be to blame include Hycodan, requip, Zoloft, or buspar. Also takes unknown quantity of ativan and medications that he gets from his friends occasionally.   - appreciate psych eval  Hypotension: Resolved.  Pulmonary nodules: CTA chest demonstrated unchanged bilateral pulmonary nodules with the largest in the right lower lobe. Follow is recommended as per previous recommendations per radiology.  Abdominal distention: Abdominal film raised Austin Blevins question of perigastric pneumoperitoniem. CT abdomen was performed which showed no obstruction, moderate stool load throughout the colonic confines, and no pneumoperitoneum.   Chest pain: Seems to be primarily with cough.  EKG without ischemic changes at presentation  and negative troponins x 4.    - continue hycodan for cough  - voltaren, lidocaine patch    Acute T7 vertebral body fracture: Most certainly largely to blame for the patient's discomfort, but also possibly Jidenna Figgs consequence of the patient's cough due to acute bronchitis and his chronic on and off steroid use for severe COPD. Doubt that this fracture and the patient's osteopenic spine would be appropriate for kyphoplasty. Will add toradol to pain control and consult PT/OT for mobility.  Chronic tremors:  Chronic. Improving. Neurology does not see anything new in CT head.  Follow outpatient with neurology.  Anxiety: Continue BuSpar  Hydrocephalus with history of VP shunt: No acute concerns at this time related to the hydrocephalus itself.  Severe protein calorie malnutrition: Nutrition consulted. Supplements ordered.  Abnormal TSH - follow free T4  Elevated LFT's:  Follow hepatitis panel, ctm  - previous abdominal imaging with grossly unremarkable liver  DVT prophylaxis: lovenox Code Status: DNR Family Communication: none currently at bedside - sister 3/13 Disposition Plan:  . Patient came from: home            . Anticipated d/c place: SNF . Barriers to d/c OR conditions which need to be met to effect Angelis Gates safe d/c: improvement in pain control, respiratory status, placement for SNF   Consultants:   Neurology  Critical  care  Procedures:  none  Antimicrobials: Anti-infectives (From admission, onward)   Start     Dose/Rate Route Frequency Ordered Stop   07/27/19 1900  ceFEPIme (MAXIPIME) 2 g in sodium chloride 0.9 % 100 mL IVPB     2 g 200 mL/hr over 30 Minutes Intravenous Every 8 hours 07/27/19 1820     07/22/19 2200  vancomycin (VANCOREADY) IVPB 1250 mg/250 mL  Status:  Discontinued     1,250 mg 166.7 mL/hr over 90 Minutes Intravenous Every 24 hours 07/21/19 2119 07/22/19 1657   07/21/19 2200  piperacillin-tazobactam (ZOSYN) IVPB 3.375 g  Status:  Discontinued     3.375  g 12.5 mL/hr over 240 Minutes Intravenous Every 8 hours 07/21/19 2117 07/22/19 1657   07/21/19 2130  vancomycin (VANCOREADY) IVPB 1250 mg/250 mL     1,250 mg 166.7 mL/hr over 90 Minutes Intravenous  Once 07/21/19 2117 07/22/19 0120   07/20/19 1830  azithromycin (ZITHROMAX) 500 mg in sodium chloride 0.9 % 250 mL IVPB     500 mg 250 mL/hr over 60 Minutes Intravenous  Once 07/20/19 1824 07/20/19 1956     Subjective: Feeling gradually better  Objective: Vitals:   07/30/19 0615 07/30/19 0630 07/30/19 0800 07/30/19 0824  BP: 117/80 115/88 116/82   Pulse: 87 84 90 (!) 103  Resp: (!) 25 20 (!) 21 (!) 26  Temp:      TempSrc:      SpO2: 99% 100% 99% 96%  Weight:      Height:        Intake/Output Summary (Last 24 hours) at 07/30/2019 0923 Last data filed at 07/30/2019 0408 Gross per 24 hour  Intake 1220 ml  Output 1700 ml  Net -480 ml   Filed Weights   07/20/19 1652 07/29/19 0500 07/30/19 0400  Weight: 55.3 kg 56.3 kg 56.2 kg    Examination:  General: No acute distress. Cardiovascular: Heart sounds show Austin Blevins regular rate, and rhythm Lungs: wheezing improved Abdomen: Soft, nontender, nondistended Neurological: Alert and oriented 3. Moves all extremities 4. Cranial nerves II through XII grossly intact. Skin: Warm and dry. No rashes or lesions. Extremities: No clubbing or cyanosis. No edema.    Data Reviewed: I have personally reviewed following labs and imaging studies  CBC: Recent Labs  Lab 07/24/19 0436 07/24/19 0436 07/26/19 0407 07/27/19 0227 07/28/19 0632 07/29/19 0505 07/30/19 0541  WBC 7.9   < > 8.2 11.6* 10.3 11.6* 12.0*  NEUTROABS 6.1  --   --  9.5* 9.3* 10.0* 10.5*  HGB 11.9*   < > 11.7* 13.0 12.8* 12.5* 11.8*  HCT 38.0*   < > 37.0* 41.4 40.4 38.9* 36.7*  MCV 98.4   < > 97.1 97.2 95.3 96.0 96.3  PLT 182   < > 182 243 208 222 206   < > = values in this interval not displayed.   Basic Metabolic Panel: Recent Labs  Lab 07/24/19 0436 07/24/19 0436  07/26/19 0407 07/27/19 0227 07/28/19 0632 07/29/19 0505 07/30/19 0541  NA 140   < > 140 142 138 140 140  K 3.9   < > 3.9 4.5 4.5 3.8 4.5  CL 103   < > 102 104 101 101 99  CO2 33*   < > 32 32 28 32 35*  GLUCOSE 77   < > 116* 95 196* 113* 153*  BUN 21*   < > 23* 21* 20 23* 24*  CREATININE 1.01   < > 1.01 0.98 1.01 1.03 0.94  CALCIUM 8.6*   < > 8.4* 9.0 8.5* 8.6* 8.9  MG 2.3  --   --  1.9 2.1 2.3 2.1  PHOS 4.0  --   --  4.6 4.5 3.6 2.8   < > = values in this interval not displayed.   GFR: Estimated Creatinine Clearance: 68.9 mL/min (by C-G formula based on SCr of 0.94 mg/dL). Liver Function Tests: Recent Labs  Lab 07/27/19 0227 07/28/19 0632 07/29/19 0505 07/30/19 0541  AST 24 28 34 47*  ALT 37 40 51* 92*  ALKPHOS 76 73 74 72  BILITOT 0.6 0.6 0.4 0.5  PROT 6.5 6.1* 6.1* 5.8*  ALBUMIN 3.4* 3.0* 3.2* 2.9*   No results for input(s): LIPASE, AMYLASE in the last 168 hours. No results for input(s): AMMONIA in the last 168 hours. Coagulation Profile: No results for input(s): INR, PROTIME in the last 168 hours. Cardiac Enzymes: No results for input(s): CKTOTAL, CKMB, CKMBINDEX, TROPONINI in the last 168 hours. BNP (last 3 results) No results for input(s): PROBNP in the last 8760 hours. HbA1C: No results for input(s): HGBA1C in the last 72 hours. CBG: Recent Labs  Lab 07/29/19 1603 07/29/19 1922 07/29/19 2327 07/30/19 0315 07/30/19 0742  GLUCAP 296* 241* 244* 133* 144*   Lipid Profile: No results for input(s): CHOL, HDL, LDLCALC, TRIG, CHOLHDL, LDLDIRECT in the last 72 hours. Thyroid Function Tests: Recent Labs    07/29/19 0505  TSH 2.103  FREET4 0.50*   Anemia Panel: Recent Labs    07/29/19 0505  FERRITIN 160   Sepsis Labs: Recent Labs  Lab 07/28/19 1761  PROCALCITON 0.11    Recent Results (from the past 240 hour(s))  SARS CORONAVIRUS 2 (TAT 6-24 HRS) Nasopharyngeal Nasopharyngeal Swab     Status: None   Collection Time: 07/20/19  8:09 PM    Specimen: Nasopharyngeal Swab  Result Value Ref Range Status   SARS Coronavirus 2 NEGATIVE NEGATIVE Final    Comment: (NOTE) SARS-CoV-2 target nucleic acids are NOT DETECTED. The SARS-CoV-2 RNA is generally detectable in upper and lower respiratory specimens during the acute phase of infection. Negative results do not preclude SARS-CoV-2 infection, do not rule out co-infections with other pathogens, and should not be used as the sole basis for treatment or other patient management decisions. Negative results must be combined with clinical observations, patient history, and epidemiological information. The expected result is Negative. Fact Sheet for Patients: SugarRoll.be Fact Sheet for Healthcare Providers: https://www.woods-mathews.com/ This test is not yet approved or cleared by the [place] FDA and  has been authorized for detection and/or diagnosis of SARS-CoV-2 by FDA under an Emergency Use Authorization (EUA). This EUA will remain  in effect (meaning this test can be used) for the duration of the COVID-19 declaration under Section 56 4(b)(1) of the Act, 21 U.S.C. section 360bbb-3(b)(1), unless the authorization is terminated or revoked sooner. Performed at [facility], [place] [address], [place], [place] [address]   Culture, blood (x 2)     Status: None   Collection Time: 07/21/19  7:37 PM   Specimen: BLOOD  Result Value Ref Range Status   Specimen Description BLOOD LEFT ANTECUBITAL  Final   Special Requests   Final    BOTTLES DRAWN AEROBIC AND ANAEROBIC Blood Culture adequate volume   Culture   Final    NO GROWTH 5 DAYS Performed at [facility], [address], [place], [place] [address]    Report Status 07/26/2019 FINAL  Final  Culture, blood (x  2)     Status: None   Collection Time: 07/21/19  7:38 PM   Specimen: BLOOD  Result Value Ref Range Status   Specimen Description BLOOD BLOOD LEFT HAND  Final    Special Requests   Final    BOTTLES DRAWN AEROBIC AND ANAEROBIC Blood Culture adequate volume   Culture   Final    NO GROWTH 5 DAYS Performed at [facility], [address] [place], [place] [address]    Report Status 07/26/2019 FINAL  Final  Urine culture     Status: None   Collection Time: 07/22/19  6:00 AM   Specimen: Urine, Random  Result Value Ref Range Status   Specimen Description   Final    URINE, RANDOM Performed at [facility], [address], [place], [place] [address]    Special Requests   Final    NONE Performed at [facility], [address], [place], [place] [address]    Culture   Final    NO GROWTH Performed at [facility], [place] [address], [place], [place] [address]    Report Status 07/23/2019 FINAL  Final  MRSA PCR Screening     Status: None   Collection Time: 07/22/19 12:07 PM   Specimen: Nasal Mucosa; Nasopharyngeal  Result Value Ref Range Status   MRSA by PCR NEGATIVE NEGATIVE Final    Comment:        The GeneXpert MRSA Assay (FDA approved for NASAL specimens only), is one component of Stefani Baik comprehensive MRSA colonization surveillance program. It is not intended to diagnose MRSA infection nor to guide or monitor treatment for MRSA infections. Performed at [facility], [address], [place], [place] [address]          Radiology Studies: No results found.      Scheduled Meds: . budesonide (PULMICORT) nebulizer solution  0.5 mg Nebulization BID  . chlordiazePOXIDE  25 mg Oral QHS  . Chlorhexidine Gluconate Cloth  6 each Topical Daily  . diclofenac Sodium  2 g Topical QID  . enoxaparin (LOVENOX) injection  40 mg Subcutaneous Q24H  . feeding supplement (ENSURE ENLIVE)  237 mL Oral BID BM  . folic acid  1 mg Oral Daily  . gabapentin  300 mg Oral BID  . insulin aspart  0-9 Units Subcutaneous Q4H  . ipratropium-albuterol  3 mL Nebulization TID  . lidocaine  1 patch Transdermal Q24H  .  multivitamin with minerals  1 tablet Oral Daily  . nicotine  21 mg Transdermal Daily  . pantoprazole  40 mg Oral Daily  . predniSONE  40 mg Oral Q breakfast   Followed by  . [START ON 08/01/2019] predniSONE  30 mg Oral Q breakfast   Followed by  . [START ON 08/03/2019] predniSONE  20 mg Oral Q breakfast   Followed by  . [START ON 08/05/2019] predniSONE  10 mg Oral Q breakfast  . QUEtiapine  25 mg Oral Daily  . QUEtiapine  50 mg Oral QHS  . rOPINIRole  1 mg Oral q AM  . rOPINIRole  4 mg Oral QHS  . sertraline  150 mg Oral Daily  . thiamine  100 mg Oral Daily  . tiotropium  18 mcg Inhalation Daily   Continuous Infusions: . ceFEPime (MAXIPIME) IV Stopped (07/30/19 0357)     LOS: 9 days    Time spent: over 30 min    Fayrene Helper, MD [facility]   To contact the attending provider  between 7A-7P or the covering provider during after hours 7P-7A, please log into the web site [email or web address] and access using universal [place] password for that web site. If you do not have the password, please call the hospital operator.  07/30/2019, 9:23 AM

## 2019-07-30 NOTE — Progress Notes (Signed)
Hospitalist notified that the patient has been having MEWS score of 3 since ICU. MD has seen patient and the patient is currently being monitor.

## 2019-07-31 ENCOUNTER — Inpatient Hospital Stay
Admit: 2019-07-31 | Discharge: 2019-07-31 | Disposition: A | Discharge status: Home / Self Care | Payer: Medicare Other | Attending: Family Medicine | Admitting: Family Medicine

## 2019-07-31 LAB — GLUCOSE, CAPILLARY
Glucose-Capillary: 116 mg/dL — ABNORMAL HIGH (ref 70–99)
Glucose-Capillary: 139 mg/dL — ABNORMAL HIGH (ref 70–99)
Glucose-Capillary: 150 mg/dL — ABNORMAL HIGH (ref 70–99)
Glucose-Capillary: 154 mg/dL — ABNORMAL HIGH (ref 70–99)
Glucose-Capillary: 242 mg/dL — ABNORMAL HIGH (ref 70–99)
Glucose-Capillary: 267 mg/dL — ABNORMAL HIGH (ref 70–99)
Glucose-Capillary: 279 mg/dL — ABNORMAL HIGH (ref 70–99)

## 2019-07-31 LAB — COMPREHENSIVE METABOLIC PANEL
ALT: 101 U/L — ABNORMAL HIGH (ref 0–44)
AST: 43 U/L — ABNORMAL HIGH (ref 15–41)
Albumin: 3 g/dL — ABNORMAL LOW (ref 3.5–5.0)
Alkaline Phosphatase: 79 U/L (ref 38–126)
Anion gap: 7 (ref 5–15)
BUN: 27 mg/dL — ABNORMAL HIGH (ref 6–20)
CO2: 35 mmol/L — ABNORMAL HIGH (ref 22–32)
Calcium: 8.9 mg/dL (ref 8.9–10.3)
Chloride: 98 mmol/L (ref 98–111)
Creatinine, Ser: 1.03 mg/dL (ref 0.61–1.24)
GFR calc Af Amer: >60 mL/min (ref >60)
GFR calc non Af Amer: >60 mL/min (ref >60)
Glucose, Bld: 157 mg/dL — ABNORMAL HIGH (ref 70–99)
Potassium: 3.9 mmol/L (ref 3.5–5.1)
Sodium: 140 mmol/L (ref 135–145)
Total Bilirubin: 0.6 mg/dL (ref 0.3–1.2)
Total Protein: 5.8 g/dL — ABNORMAL LOW (ref 6.5–8.1)

## 2019-07-31 LAB — CBC WITH DIFFERENTIAL/PLATELET
Abs Immature Granulocytes: 0.31 10*3/uL — ABNORMAL HIGH (ref 0.00–0.07)
Basophils Absolute: 0.1 10*3/uL (ref 0.0–0.1)
Basophils Relative: 1 %
Eosinophils Absolute: 0.1 10*3/uL (ref 0.0–0.5)
Eosinophils Relative: 1 %
HCT: 37.5 % — ABNORMAL LOW (ref 39.0–52.0)
Hemoglobin: 11.7 g/dL — ABNORMAL LOW (ref 13.0–17.0)
Immature Granulocytes: 2 %
Lymphocytes Relative: 9 %
Lymphs Abs: 1.2 10*3/uL (ref 0.7–4.0)
MCH: 30.6 pg (ref 26.0–34.0)
MCHC: 31.2 g/dL (ref 30.0–36.0)
MCV: 98.2 fL (ref 80.0–100.0)
Monocytes Absolute: 0.7 10*3/uL (ref 0.1–1.0)
Monocytes Relative: 5 %
Neutro Abs: 11.8 10*3/uL — ABNORMAL HIGH (ref 1.7–7.7)
Neutrophils Relative %: 82 %
Platelets: 213 10*3/uL (ref 150–400)
RBC: 3.82 MIL/uL — ABNORMAL LOW (ref 4.22–5.81)
RDW: 12.8 % (ref 11.5–15.5)
WBC: 14.3 10*3/uL — ABNORMAL HIGH (ref 4.0–10.5)
nRBC: 0 % (ref 0.0–0.2)

## 2019-07-31 LAB — PHOSPHORUS: Phosphorus: 3 mg/dL (ref 2.5–4.6)

## 2019-07-31 LAB — MAGNESIUM: Magnesium: 2.1 mg/dL (ref 1.7–2.4)

## 2019-07-31 MED ORDER — GABAPENTIN 300 MG PO CAPS: 300.0000 mg | ORAL_CAPSULE | Freq: Three times a day (TID) | ORAL | Status: DC

## 2019-07-31 MED ORDER — GABAPENTIN 300 MG PO CAPS
300.0000 mg | ORAL_CAPSULE | Freq: Three times a day (TID) | ORAL | Status: DC
  Administered 2019-07-31 – 2019-08-02 (×5): 300 mg via ORAL
  Filled 2019-07-31 (×5): qty 1

## 2019-07-31 MED ORDER — OXYCODONE HCL 5 MG PO TABS
5.0000 mg | ORAL_TABLET | ORAL | Status: DC | PRN
  Administered 2019-07-31 – 2019-08-02 (×8): 5 mg via ORAL
  Filled 2019-07-31 (×8): qty 1

## 2019-07-31 NOTE — Progress Notes (Addendum)
Physical Therapy Treatment and Re-evaluation Patient Details Name: Austin Blevins MRN: [id number] DOB: June 21, 1960 Today's Date: 07/31/2019    History of Present Illness Austin Blevins is a 59 y.o. male with medical history significant for hydrocephalus with known VP shunt, severe COPD with chronic respiratory failure on home O2 at 3 L, anxiety and restless leg syndrome who presented to the emergency room with severe respiratory distress and complaint of left  lateral chest pain and mid to lower back pain. Pain is worse with movement and coughing and is of moderate intensity described as sharp and nonradiating. CTA of his chest which was negative for PE but showed advanced emphysema and stable pulmonary nodules also showed a "mild T7 inferior endplate compression fracture is new." Pt t/f to IC09 on 07/22/2019 d/t less intelligible speech. Repeat CT unchanged. Neurology was consulted and felt pt might be delerious. Precedex drip was started that has since been discontinued. Seen for PT re-evaluation this AM.    PT Comments    Pt alert, sitting in bed. Endorsed mid back pain 10/10 RN in room and aware, administering pain medications at end of session. The patient demonstrated bed mobility mod I with use of bed rails. Sit <> stand with RW and without AD, improved safety with RW and pt reported comfort. The patient ambulated ~33ft in room, CGA. Pt shaky, endorsed fatigue, able to complete very slowly. desatted to 85% on 2.5L, able to recover to 90% with rest and PLB within 1 minute. Further mobility deferred due to breakfast arrival.  Overall the patient continued to demonstrate deficits that impede the patient's functional abilities and differ from PLOF. The patient would benefit from continued skilled PT intervention. Due to decline in functional mobility, activity tolerance/endurance and acute use of AD, recommendation is SNF.    Follow Up Recommendations  SNF     Equipment Recommendations  Rolling  walker with 5" wheels    Recommendations for Other Services       Precautions / Restrictions Precautions Precautions: Fall Restrictions Weight Bearing Restrictions: No Other Position/Activity Restrictions: watch HR and spO2    Mobility  Bed Mobility Overal bed mobility: Modified Independent             General bed mobility comments: use of bed rail  Transfers Overall transfer level: Needs assistance Equipment used: None;Rolling walker (2 wheeled) Transfers: Sit to/from Stand Sit to Stand: Min guard         General transfer comment: utilized bed rails for transfers, and RW. Much improved safety with RW compared to without AD.  Ambulation/Gait Ambulation/Gait assistance: Min guard Gait Distance (Feet): 12 Feet Assistive device: Rolling walker (2 wheeled)       General Gait Details: Pt shaky, endorsed fatigue, able to complete very slowly. desatted to 85% on 2.5L, able to recover to 90% with rest and PLB within 1 minute   Stairs             Wheelchair Mobility    Modified Rankin (Stroke Patients Only)       Balance Overall balance assessment: Needs assistance Sitting-balance support: Feet supported;Single extremity supported Sitting balance-Leahy Scale: Good       Standing balance-Leahy Scale: Fair Standing balance comment: Pt prefers at least unilateral UE in standing and during dynamic balance activities.                            Cognition Arousal/Alertness: Awake/alert Behavior During Therapy: WFL for  tasks assessed/performed Overall Cognitive Status: Within Functional Limits for tasks assessed                                        Exercises      General Comments        Pertinent Vitals/Pain Pain Assessment: 0-10 Pain Score: 10-Worst pain ever Pain Location: Mid back pain, midline radiating bilateral Pain Descriptors / Indicators: Grimacing;Stabbing Pain Intervention(s): Limited activity within  patient's tolerance;Monitored during session;Repositioned    Home Living                      Prior Function            PT Goals (current goals can now be found in the care plan section) Acute Rehab PT Goals Potential to Achieve Goals: Good Progress towards PT goals: Progressing toward goals    Frequency    Min 2X/week      PT Plan Current plan remains appropriate    Co-evaluation              AM-PAC PT "6 Clicks" Mobility   Outcome Measure  Help needed turning from your back to your side while in a flat bed without using bedrails?: None Help needed moving from lying on your back to sitting on the side of a flat bed without using bedrails?: None Help needed moving to and from a bed to a chair (including a wheelchair)?: A Little Help needed standing up from a chair using your arms (e.g., wheelchair or bedside chair)?: A Little Help needed to walk in hospital room?: A Little Help needed climbing 3-5 steps with a railing? : A Lot 6 Click Score: 19    End of Session Equipment Utilized During Treatment: Gait belt;Oxygen(2.5L) Activity Tolerance: Patient tolerated treatment well Patient left: with chair alarm set;in chair;with call bell/phone within reach;with nursing/sitter in room Nurse Communication: Mobility status PT Visit Diagnosis: Other abnormalities of gait and mobility (R26.89);Muscle weakness (generalized) (M62.81);Other symptoms and signs involving the nervous system (Q11.941)     Time: 7408-1448 PT Time Calculation (min) (ACUTE ONLY): 25 min  Charges:  $Therapeutic Exercise: 23-37 mins                    Lieutenant Diego PT, DPT 12:19 PM,07/31/19

## 2019-07-31 NOTE — Progress Notes (Signed)
*  PRELIMINARY RESULTS* Echocardiogram 2D Echocardiogram has been performed.  Joanette Gula Kary Sugrue 07/31/2019, 11:06 AM

## 2019-07-31 NOTE — Progress Notes (Addendum)
PROGRESS NOTE    Austin Blevins  RVI:[id number] DOB: 1960/10/21 DOA: 07/20/2019 PCP: [name], NP  Brief Narrative:  Austin Blevins Austin Blevins 59 y.o.malewith medical history significant forhydrocephalus with known VP shunt, severe COPD with chronic respiratory failure on home O2 at 3 L, anxiety and restless leg syndrome who presented to the emergency room with severe respiratory distress and complaint ofleftlateral chest pain and mid to lowerback pain. Symptoms have been ongoing for 4 days. Pain is worse with movement and coughing and is of moderate intensity described as sharp and nonradiating. He has no associated nausea vomiting or diaphoresis. Shortness of breath worsensthechest pain. He denies Austin Blevins fall. He denies fever, chills, abdominal pain, nausea vomiting or change in bowel movement.He saw his pulmonologist Austin Blevins few days prior for follow-up and received Austin Blevins prescription for steroid taper pack and tramadol for the back pain.  On arrival to the emergency room, per ED providerhe was initially very tachypneic at 30, using accessory muscles to breathe. O2 sat maintained at 90% on his home flow rate at 3 L. He was also tachycardic at 134 but afebrile. BP 172/86. On his blood work, BNP 44 troponin less than 2, white cell count 12,500 with otherwise unremarkable blood work. EKG showed no acute ST-T wave changes. He had Austin Blevins CTA of his chest which was negative for PE but showed advanced emphysema and stable pulmonary nodules also showed Austin Blevins new T7 fracture. Patient was given DuoNeb treatments in the emergency room as well as IV steroids with significant improvement in his breathing. Hospitalist consulted for admission.  The patient has been admitted to Austin Blevins telemetry bed. He is receiving steroids, antibiotics and nebulizer treatments. He is also receiving pain control. Sinus tachycardia has been addressed with ativan.   On 07/21/2019 the patient had increased heart rate and increased oxygen  requirements. CXR demonstrated Austin Blevins small left pleural effusion and emphysema. No change from previous. EKG demonstrated sinus tachycardia which was addressed with ativan.   On 07/22/2019 his wife expressed to nursing that the patient's tremors were worse than usual and that his speech was less intelligible than normal. CT head was performed and was unchanged from previous. Neurology was consulted. They felt that the patient was delerious and recommended antipsychotic for sleep. The patient was transferred to the ICU and started on Donn Wilmot precedex drip. This has been able to be weaned to off. Mental status is improved. IV fluid bolus has been given for hypotension on the afternoon of 07/23/2019.  The patient has been transitioned off of precedex to librium. He is calmer. Heart rate and RR are improved, although he is somewhat lethargic.  Assessment & Plan:   Principal Problem:   COPD with acute exacerbation (Austin Blevins) Active Problems:   Chronic respiratory failure with hypoxia (HCC)   Pulmonary nodules   Chest pain at rest   Thoracic vertebral fracture (HCC)   COPD exacerbation (HCC)   Visual hallucinations  Acute on chronic respiratory failure with hypoxia (HCC)  COPD Exacerbation  COPD on 2 - 3 L at baseline (on 2 L at rest and 3 L with activity): The patient uses O2 at 3L by Austin Blevins at home chronically. Currently requiring 4 L.  He'd been treated with steroids and pain meds by his pulmonologist prior to admission.  - wheezing improved today on steroids (taper to home dose)  - continue antibiotics -> narrow  - IS, flutter   - repeat CXR 3/11 notable for bilateral lung opacities with mild loculated L  effusion (discussed with rads who noted this appeared chronic - noted on 03/2018 imaging)  Acute Metabolic Encephalopathy  Delirium:  On 3/6 pt with worsening tremors, difficult to understand speech.  Negative head CT.  Thought to be delirious at that time.  Overnight 3/10-11 he had increasing restlessness and  restless leg symptoms, encephalopathic again.  ? If this was related to requip dose.  Was restarted on precedex at this time, now d/c'd.  Continues to be encephalopathic with lethargy today.  - Stop librium  - Continue seroquel (50 mg qhs and 25 mg qam)  - Continue ropinirole for restless legs, which is worsening delirium (will increase to 4 mg at bedtime -> follow hallucinations, maybe worsened with this)  - gabapentin for RLS  - appreciate neurology input -> suspected 2/2 delirium in setting of poor oxygenation -> seroquel if needed  - psychiatry has seen him, discontinued trazodone and buspar - seroquel adjusted  - s/p precedex, now on librium as above.  Suggestion of benzo withdrawal from previous note, but I don't see this in home medlist or PMP aware.  Apparently takes meds from friends at times (unknown quantity - ? Valium, percocets). Will start to taper librium.  Will discuss with sister (sounds like he's not taking benzos that consistently?).   Sinus Tachycardia: intermittent, maybe related to discomfort with RLS?  Will continue to monitor.  TSH wnl, free T4 slightly low.  Follow echo  Started on metoprolol  Visual hallucinations: Likely due to Austin Blevins medication, ? Benzo withdrawal. Home medications that may be to blame include Hycodan, requip, Zoloft, or buspar. Also takes unknown quantity of ativan and medications that he gets from his friends occasionally.   - appreciate psych eval  Hypotension: Resolved.  Pulmonary nodules: CTA chest demonstrated unchanged bilateral pulmonary nodules with the largest in the right lower lobe. Follow is recommended as per previous recommendations per radiology.  Abdominal distention: Abdominal film raised Tayah Idrovo question of perigastric pneumoperitoniem. CT abdomen was performed which showed no obstruction, moderate stool load throughout the colonic confines, and no pneumoperitoneum.   Chest pain: Seems to be primarily with cough.  EKG without ischemic  changes at presentation and negative troponins x 4.    - continue hycodan for cough  - voltaren, lidocaine patch    Acute T7 vertebral body fracture: Most certainly largely to blame for the patient's discomfort, but also possibly Robbin Loughmiller consequence of the patient's cough due to acute bronchitis and his chronic on and off steroid use for severe COPD. Doubt that this fracture and the patient's osteopenic spine would be appropriate for kyphoplasty. Will add toradol to pain control and consult PT/OT for mobility.  Chronic tremors:  Chronic. Improving. Neurology does not see anything new in CT head.  Follow outpatient with neurology.  Anxiety: Continue BuSpar  Hydrocephalus with history of VP shunt: No acute concerns at this time related to the hydrocephalus itself.  Severe protein calorie malnutrition: Nutrition consulted. Supplements ordered.  Abnormal TSH - follow free T4  Elevated LFT's:  Follow hepatitis panel, ctm  - previous abdominal imaging with grossly unremarkable liver  Prediabetes: ssi, levemir  DVT prophylaxis: lovenox Code Status: DNR Family Communication: none currently at bedside - sister 3/15 Disposition Plan:  . Patient came from: home            . Anticipated d/c place: SNF . Barriers to d/c OR conditions which need to be met to effect [place] safe d/c: improvement in pain control, respiratory status, placement for SNF  Consultants:   Neurology  Critical care  Procedures:  none  Antimicrobials: Anti-infectives (From admission, onward)   Start     Dose/Rate Route Frequency Ordered Stop   07/30/19 1000  cefTRIAXone (ROCEPHIN) 2 g in sodium chloride 0.9 % 100 mL IVPB     2 g 200 mL/hr over 30 Minutes Intravenous Every 24 hours 07/30/19 0924     07/27/19 1900  ceFEPIme (MAXIPIME) 2 g in sodium chloride 0.9 % 100 mL IVPB  Status:  Discontinued     2 g 200 mL/hr over 30 Minutes Intravenous Every 8 hours 07/27/19 1820 07/30/19 0924   07/22/19 2200  vancomycin  (VANCOREADY) IVPB 1250 mg/250 mL  Status:  Discontinued     1,250 mg 166.7 mL/hr over 90 Minutes Intravenous Every 24 hours 07/21/19 2119 07/22/19 1657   07/21/19 2200  piperacillin-tazobactam (ZOSYN) IVPB 3.375 g  Status:  Discontinued     3.375 g 12.5 mL/hr over 240 Minutes Intravenous Every 8 hours 07/21/19 2117 07/22/19 1657   07/21/19 2130  vancomycin (VANCOREADY) IVPB 1250 mg/250 mL     1,250 mg 166.7 mL/hr over 90 Minutes Intravenous  Once 07/21/19 2117 07/22/19 0120   07/20/19 1830  azithromycin (ZITHROMAX) 500 mg in sodium chloride 0.9 % 250 mL IVPB     500 mg 250 mL/hr over 60 Minutes Intravenous  Once 07/20/19 1824 07/20/19 1956     Subjective: No new complaints  Objective: Vitals:   07/31/19 0805 07/31/19 1235 07/31/19 1446 07/31/19 1607  BP:  96/64  127/71  Pulse:  (!) 52  (!) 107  Resp:  18  20  Temp:  97.7 F (36.5 C)  97.8 F (36.6 C)  TempSrc:  Oral  Oral  SpO2: 94% 95% 94% 93%  Weight:      Height:        Intake/Output Summary (Last 24 hours) at 07/31/2019 1733 Last data filed at 07/31/2019 0532 Gross per 24 hour  Intake 240 ml  Output 300 ml  Net -60 ml   Filed Weights   07/29/19 0500 07/30/19 0400 07/30/19 1046  Weight: 56.3 kg 56.2 kg 56.1 kg    Examination:  General: No acute distress. Cardiovascular: Heart sounds show Zein Helbing regular rate, and rhythm Lungs: Clear to auscultation bilaterally Abdomen: Soft, nontender, nondistende Neurological: Alert and oriented 3. Moves all extremities 4. Cranial nerves II through XII grossly intact. Skin: Warm and dry. No rashes or lesions. Extremities: No clubbing or cyanosis. No edema.   Data Reviewed: I have personally reviewed following labs and imaging studies  CBC: Recent Labs  Lab 07/27/19 0227 07/28/19 0632 07/29/19 0505 07/30/19 0541 07/31/19 0424  WBC 11.6* 10.3 11.6* 12.0* 14.3*  NEUTROABS 9.5* 9.3* 10.0* 10.5* 11.8*  HGB 13.0 12.8* 12.5* 11.8* 11.7*  HCT 41.4 40.4 38.9* 36.7* 37.5*  MCV  97.2 95.3 96.0 96.3 98.2  PLT 243 208 222 206 226   Basic Metabolic Panel: Recent Labs  Lab 07/27/19 0227 07/28/19 0632 07/29/19 0505 07/30/19 0541 07/31/19 0424  NA 142 138 140 140 140  K 4.5 4.5 3.8 4.5 3.9  CL 104 101 101 99 98  CO2 32 28 32 35* 35*  GLUCOSE 95 196* 113* 153* 157*  BUN 21* 20 23* 24* 27*  CREATININE 0.98 1.01 1.03 0.94 1.03  CALCIUM 9.0 8.5* 8.6* 8.9 8.9  MG 1.9 2.1 2.3 2.1 2.1  PHOS 4.6 4.5 3.6 2.8 3.0   GFR: Estimated Creatinine Clearance: 62.8 mL/min (by C-G formula based on SCr  of 1.03 mg/dL). Liver Function Tests: Recent Labs  Lab 07/27/19 0227 07/28/19 0632 07/29/19 0505 07/30/19 0541 07/31/19 0424  AST 24 28 34 47* 43*  ALT 37 40 51* 92* 101*  ALKPHOS 76 73 74 72 79  BILITOT 0.6 0.6 0.4 0.5 0.6  PROT 6.5 6.1* 6.1* 5.8* 5.8*  ALBUMIN 3.4* 3.0* 3.2* 2.9* 3.0*   No results for input(s): LIPASE, AMYLASE in the last 168 hours. No results for input(s): AMMONIA in the last 168 hours. Coagulation Profile: No results for input(s): INR, PROTIME in the last 168 hours. Cardiac Enzymes: No results for input(s): CKTOTAL, CKMB, CKMBINDEX, TROPONINI in the last 168 hours. BNP (last 3 results) No results for input(s): PROBNP in the last 8760 hours. HbA1C: No results for input(s): HGBA1C in the last 72 hours. CBG: Recent Labs  Lab 07/31/19 0002 07/31/19 0423 07/31/19 0741 07/31/19 1237 07/31/19 1608  GLUCAP 139* 150* 116* 242* 267*   Lipid Profile: No results for input(s): CHOL, HDL, LDLCALC, TRIG, CHOLHDL, LDLDIRECT in the last 72 hours. Thyroid Function Tests: Recent Labs    07/29/19 0505  TSH 2.103  FREET4 0.50*   Anemia Panel: Recent Labs    07/29/19 0505  FERRITIN 160   Sepsis Labs: Recent Labs  Lab 07/28/19 7342  PROCALCITON 0.11    Recent Results (from the past 240 hour(s))  Culture, blood (x 2)     Status: None   Collection Time: 07/21/19  7:37 PM   Specimen: BLOOD  Result Value Ref Range Status   Specimen  Description BLOOD LEFT ANTECUBITAL  Final   Special Requests   Final    BOTTLES DRAWN AEROBIC AND ANAEROBIC Blood Culture adequate volume   Culture   Final    NO GROWTH 5 DAYS Performed at [facility], [address], [place], [place] [address]    Report Status 07/26/2019 FINAL  Final  Culture, blood (x 2)     Status: None   Collection Time: 07/21/19  7:38 PM   Specimen: BLOOD  Result Value Ref Range Status   Specimen Description BLOOD BLOOD LEFT HAND  Final   Special Requests   Final    BOTTLES DRAWN AEROBIC AND ANAEROBIC Blood Culture adequate volume   Culture   Final    NO GROWTH 5 DAYS Performed at [facility], [address] [place], [place] [address]    Report Status 07/26/2019 FINAL  Final  Urine culture     Status: None   Collection Time: 07/22/19  6:00 AM   Specimen: Urine, Random  Result Value Ref Range Status   Specimen Description   Final    URINE, RANDOM Performed at [facility], [address] [place], [place] [address]    Special Requests   Final    NONE Performed at [facility], [address] [place], [place] [address]    Culture   Final    NO GROWTH Performed at [facility], [place] [address] [place],  [address]    Report Status 07/23/2019 FINAL  Final  MRSA PCR Screening     Status: None   Collection Time: 07/22/19 12:07 PM   Specimen: Nasal Mucosa; Nasopharyngeal  Result Value Ref Range Status   MRSA by PCR NEGATIVE NEGATIVE Final    Comment:        The GeneXpert MRSA Assay (FDA approved for NASAL specimens only), is one component of Quade Ramirez comprehensive MRSA colonization surveillance program. It is not intended to diagnose  MRSA infection nor to guide or monitor treatment for MRSA infections. Performed at [facility], [address], [place], [place] [address]          Radiology Studies: No results found.      Scheduled Meds: . budesonide (PULMICORT) nebulizer  solution  0.5 mg Nebulization BID  . Chlorhexidine Gluconate Cloth  6 each Topical Daily  . diclofenac Sodium  2 g Topical QID  . enoxaparin (LOVENOX) injection  40 mg Subcutaneous Q24H  . feeding supplement (ENSURE ENLIVE)  237 mL Oral BID BM  . folic acid  1 mg Oral Daily  . gabapentin  300 mg Oral BID  . insulin aspart  0-15 Units Subcutaneous TID WC  . insulin aspart  0-5 Units Subcutaneous QHS  . insulin detemir  5 Units Subcutaneous BID  . ipratropium-albuterol  3 mL Nebulization TID  . lidocaine  1 patch Transdermal Q24H  . metoprolol tartrate  12.5 mg Oral BID  . multivitamin with minerals  1 tablet Oral Daily  . nicotine  21 mg Transdermal Daily  . pantoprazole  40 mg Oral Daily  . [START ON 08/01/2019] predniSONE  30 mg Oral Q breakfast   Followed by  . [START ON 08/03/2019] predniSONE  20 mg Oral Q breakfast   Followed by  . [START ON 08/05/2019] predniSONE  10 mg Oral Q breakfast  . QUEtiapine  25 mg Oral Daily  . QUEtiapine  50 mg Oral QHS  . rOPINIRole  1 mg Oral q AM  . rOPINIRole  4 mg Oral QHS  . sertraline  150 mg Oral Daily  . thiamine  100 mg Oral Daily  . tiotropium  18 mcg Inhalation Daily   Continuous Infusions: . cefTRIAXone (ROCEPHIN)  IV 2 g (07/31/19 0937)     LOS: 10 days    Time spent: over 30 min    Fayrene Helper, MD [facility]   To contact the attending provider between 7A-7P or the covering provider during after hours 7P-7A, please log into the web site [email or web address] and access using universal [place] password for that web site. If you do not have the password, please call the hospital operator.  07/31/2019, 5:33 PM

## 2019-07-31 NOTE — Progress Notes (Signed)
Occupational Therapy Treatment Patient Details Name: Austin Blevins MRN: [id number] DOB: 08-09-1960 Today's Date: 07/31/2019    History of present illness Austin Blevins is a 59 y.o. male with medical history significant for hydrocephalus with known VP shunt, severe COPD with chronic respiratory failure on home O2 at 3 L, anxiety and restless leg syndrome who presented to the emergency room with severe respiratory distress and complaint of left  lateral chest pain and mid to lower back pain. Pain is worse with movement and coughing and is of moderate intensity described as sharp and nonradiating. CTA of his chest which was negative for PE but showed advanced emphysema and stable pulmonary nodules also showed a "mild T7 inferior endplate compression fracture is new." Pt t/f to IC09 on 07/22/2019 d/t less intelligible speech. Repeat CT unchanged. Neurology was consulted and felt pt might be delerious. Precedex drip was started that has since been discontinued. Seen for PT re-evaluation this AM.   OT comments  Patient supine in bed upon entry and agreeable to therapy.  Patient's BP at 127/71 and 104 HR.  Patient moved to EOB with MOD I.  OT session targeting strengthening, breathing techniques and activity tolerance. Completed sitting B UE ther-ex using red theraband (see below). Completed standing balance/tolerance activities using RW for UE support and CGA to improve safety and ability to perform standing ADLs (see below).  Participated in grooming tasks while sitting and standing with at least 1 UE support.  Provided education on breathing techniques during all therapeutic exercise and activities to improve 02.  Supplemental 02 on 2.5L for duration of session.  HR ranged from 112-127 during activity.  Patient with complaints of lightheadedness during standing tasks but able to safely participate.  Patient reports 9/10 low back pain, but declines therapist requesting medication.  Educated patient on low trunk  rotations to reduce back pain.  Patient demonstrated well.  Patient would benefit from continued occupational therapy treatment to address performance components outlined in evaluation.  Based on today's performance, continuing to recommend SNF due to deficits in activity tolerance and continued need for supplemental 02.   Follow Up Recommendations  SNF    Equipment Recommendations  Other (comment)(Defer to next level of care)    Recommendations for Other Services      Precautions / Restrictions Precautions Precautions: Fall Restrictions Weight Bearing Restrictions: No Other Position/Activity Restrictions: Monitor HR and 02 with activity       Mobility Bed Mobility Overal bed mobility: Modified Independent                Transfers Overall transfer level: Needs assistance Equipment used: Rolling walker (2 wheeled) Transfers: Sit to/from Stand Sit to Stand: Min guard         General transfer comment: Demonstrated good self pacing and sequencing during functional transfers    Balance           Standing balance support: Single extremity supported;During functional activity Standing balance-Leahy Scale: Fair Standing balance comment: Pt prefers at least unilateral UE in standing and during dynamic balance activities.                           ADL either performed or assessed with clinical judgement   ADL Overall ADL's : Needs assistance/impaired     Grooming: Wash/dry face;Standing;Min guard;Brushing hair Grooming Details (indicate cue type and reason): CGA at sink with intermittent UE support  Lower Body Dressing Details (indicate cue type and reason): SBA to don socks while seated at EOB             Functional mobility during ADLs: Min guard;Rolling walker;Cueing for safety       Vision Patient Visual Report: No change from baseline     Perception     Praxis      Cognition Arousal/Alertness:  Awake/alert Behavior During Therapy: WFL for tasks assessed/performed Overall Cognitive Status: Within Functional Limits for tasks assessed                                 General Comments: Pleasant and cooperative with session        Exercises General Exercises - Upper Extremity Shoulder Horizontal ABduction: 15 reps;Seated;Theraband(incorporating breathing techniques; red theraband) Theraband Level (Shoulder Horizontal Abduction): Level 2 (Red) Shoulder Horizontal ADduction: 15 reps;Theraband;Seated(incorporating breathing techniques; red theraband) Theraband Level (Shoulder Horizontal Adduction): Level 2 (Red) Other Exercises Other Exercises: Completed sit to stands from EOB with occasional use of UEs.  Demonstrates good safety and pacing. Educated on breathing techniques during activity. Other Exercises: Completed standing marches , 3 reps of 10 seconds, with B UE support with emphasis on breathing technique to improve activity tolerance needed for standing grooming/hygiene tasks. Other Exercises: Completed B UE strengthening with red theraband, 2 sets of 15, with emphasis on body mechanics and breathing to improve strength needed for safe completion of BADLs.   Shoulder Instructions       General Comments HR between 112-127 during functional activities.  O2 remained above 86% on 2L during functional activities with cues for pursed lip breathing.    Pertinent Vitals/ Pain       Pain Assessment: 0-10 Pain Score: 9  Pain Location: Mid/low back pain Pain Descriptors / Indicators: Dull;Aching Pain Intervention(s): Limited activity within patient's tolerance;Monitored during session;Repositioned;Other (comment)(Instructed patient in low trunk rotations to alleviate pain)  Home Living                                          Prior Functioning/Environment              Frequency  Min 2X/week        Progress Toward Goals  OT Goals(current  goals can now be found in the care plan section)  Progress towards OT goals: Progressing toward goals  Acute Rehab OT Goals Patient Stated Goal: To return to PLOF OT Goal Formulation: With patient Time For Goal Achievement: 08/04/19 Potential to Achieve Goals: Good  Plan Discharge plan remains appropriate;Frequency remains appropriate    Co-evaluation                 AM-PAC OT "6 Clicks" Daily Activity     Outcome Measure   Help from another person eating meals?: None Help from another person taking care of personal grooming?: A Little(in standing) Help from another person toileting, which includes using toliet, bedpan, or urinal?: A Little Help from another person bathing (including washing, rinsing, drying)?: A Little Help from another person to put on and taking off regular upper body clothing?: None Help from another person to put on and taking off regular lower body clothing?: A Little 6 Click Score: 20    End of Session Equipment Utilized During Treatment: Rolling walker  OT Visit Diagnosis: Repeated falls (  R29.6);Other abnormalities of gait and mobility (R26.89);Feeding difficulties (R63.3)   Activity Tolerance Patient tolerated treatment well   Patient Left in bed;with call bell/phone within reach;with bed alarm set   Nurse Communication          Time: 9528-4132 OT Time Calculation (min): 41 min  Charges: OT General Charges $OT Visit: 1 Visit OT Treatments $Self Care/Home Management : 8-22 mins $Therapeutic Activity: 8-22 mins $Therapeutic Exercise: 8-22 mins  Louanne Belton, MS, OTR/L 07/31/19, 5:07 PM

## 2019-07-31 NOTE — Plan of Care (Signed)
  Problem: Education: Goal: Knowledge of General Education information will improve Description: Including pain rating scale, medication(s)/side effects and non-pharmacologic comfort measures Outcome: Progressing   Problem: Health Behavior/Discharge Planning: Goal: Ability to manage health-related needs will improve Outcome: Progressing   Problem: Clinical Measurements: Goal: Ability to maintain clinical measurements within normal limits will improve Outcome: Not Progressing Note: WBC's continue to be elevated, most recently at 14.3. Will continue to monitor lab values for the remainder of the shift. Jari Favre [facility]

## 2019-08-01 LAB — MAGNESIUM: Magnesium: 2.3 mg/dL (ref 1.7–2.4)

## 2019-08-01 LAB — CBC WITH DIFFERENTIAL/PLATELET
Abs Immature Granulocytes: 0.47 10*3/uL — ABNORMAL HIGH (ref 0.00–0.07)
Basophils Absolute: 0.1 10*3/uL (ref 0.0–0.1)
Basophils Relative: 1 %
Eosinophils Absolute: 0.1 10*3/uL (ref 0.0–0.5)
Eosinophils Relative: 1 %
HCT: 37.8 % — ABNORMAL LOW (ref 39.0–52.0)
Hemoglobin: 11.7 g/dL — ABNORMAL LOW (ref 13.0–17.0)
Immature Granulocytes: 3 %
Lymphocytes Relative: 11 %
Lymphs Abs: 1.6 10*3/uL (ref 0.7–4.0)
MCH: 30.8 pg (ref 26.0–34.0)
MCHC: 31 g/dL (ref 30.0–36.0)
MCV: 99.5 fL (ref 80.0–100.0)
Monocytes Absolute: 0.6 10*3/uL (ref 0.1–1.0)
Monocytes Relative: 4 %
Neutro Abs: 11.4 10*3/uL — ABNORMAL HIGH (ref 1.7–7.7)
Neutrophils Relative %: 80 %
Platelets: 204 10*3/uL (ref 150–400)
RBC: 3.8 MIL/uL — ABNORMAL LOW (ref 4.22–5.81)
RDW: 12.8 % (ref 11.5–15.5)
WBC: 14.3 10*3/uL — ABNORMAL HIGH (ref 4.0–10.5)
nRBC: 0 % (ref 0.0–0.2)

## 2019-08-01 LAB — ECHOCARDIOGRAM COMPLETE
Height: 68 in
Weight: 1979.2 oz

## 2019-08-01 LAB — GLUCOSE, CAPILLARY
Glucose-Capillary: 106 mg/dL — ABNORMAL HIGH (ref 70–99)
Glucose-Capillary: 89 mg/dL (ref 70–99)
Glucose-Capillary: 240 mg/dL — ABNORMAL HIGH (ref 70–99)
Glucose-Capillary: 241 mg/dL — ABNORMAL HIGH (ref 70–99)
Glucose-Capillary: 201 mg/dL — ABNORMAL HIGH (ref 70–99)

## 2019-08-01 LAB — COMPREHENSIVE METABOLIC PANEL
ALT: 123 U/L — ABNORMAL HIGH (ref 0–44)
AST: 47 U/L — ABNORMAL HIGH (ref 15–41)
Albumin: 3 g/dL — ABNORMAL LOW (ref 3.5–5.0)
Alkaline Phosphatase: 85 U/L (ref 38–126)
Anion gap: 11 (ref 5–15)
BUN: 34 mg/dL — ABNORMAL HIGH (ref 6–20)
CO2: 35 mmol/L — ABNORMAL HIGH (ref 22–32)
Calcium: 9.1 mg/dL (ref 8.9–10.3)
Chloride: 97 mmol/L — ABNORMAL LOW (ref 98–111)
Creatinine, Ser: 1 mg/dL (ref 0.61–1.24)
GFR calc Af Amer: >60 mL/min (ref >60)
GFR calc non Af Amer: >60 mL/min (ref >60)
Glucose, Bld: 104 mg/dL — ABNORMAL HIGH (ref 70–99)
Potassium: 4.6 mmol/L (ref 3.5–5.1)
Sodium: 143 mmol/L (ref 135–145)
Total Bilirubin: 0.6 mg/dL (ref 0.3–1.2)
Total Protein: 5.9 g/dL — ABNORMAL LOW (ref 6.5–8.1)

## 2019-08-01 LAB — PHOSPHORUS: Phosphorus: 3.8 mg/dL (ref 2.5–4.6)

## 2019-08-01 MED ORDER — INSULIN ASPART 100 UNIT/ML ~~LOC~~ SOLN
3.0000 [IU] | Freq: Three times a day (TID) | SUBCUTANEOUS | Status: DC
  Administered 2019-08-02 (×2): 3 [IU] via SUBCUTANEOUS
  Filled 2019-08-01 (×2): qty 1

## 2019-08-01 NOTE — Progress Notes (Signed)
Nutrition Follow-up  DOCUMENTATION CODES:   Severe malnutrition in context of chronic illness  INTERVENTION:   Continue Ensure Enlive po BID, each supplement provides 350 kcal and 20 grams of protein.  Continue Magic cup TID with meals, each supplement provides 290 kcal and 9 grams of protein.  Continue MVI daily.  NUTRITION DIAGNOSIS:   Severe Malnutrition related to chronic illness(COPD) as evidenced by moderate fat depletion, severe fat depletion, moderate muscle depletion, severe muscle depletion. Ongoing.  GOAL:   Patient will meet greater than or equal to 90% of their needs Progressing.  MONITOR:   PO intake, Supplement acceptance, Labs, Weight trends, Skin, I & O's  ASSESSMENT:   59 y.o. male with history of hydrocephalus status post VP shunt, severe COPD on baseline 3 L nasal cannula, chronic pancreatitis, restless leg who presents to the emergency department for shortness of breath, cough, bilateral posteriolateral mid thoracic chest and back pain.   Pt continues to eat fairly well; pt eating anywhere from 50-100% of meals and is drinking Ensure and eating Magic Cups. Per chart, pt is weight stable since admit. Recommend continue supplements after discharge.   Medications reviewed and include: lovenox, folic acid, insulin, MVI, nicotine, protonix, prednisone, thiamine, ceftriaxone   Labs reviewed: BUN 34(H), K 4.6 wnl, P 3.8 wnl, Mg 2.3 wnl Wbc- 14.3(H)  Diet Order:   Diet Order            DIET DYS 3 Room service appropriate? Yes; Fluid consistency: Thin  Diet effective now             EDUCATION NEEDS:   Education needs have been addressed  Skin:  Skin Assessment: Reviewed RN Assessment(ecchymosis)  Last BM:  3/15- TYPE 5  Height:   Ht Readings from Last 1 Encounters:  07/30/19 5\' 8"  (1.727 m)   Weight:   Wt Readings from Last 1 Encounters:  08/01/19 56.9 kg   Ideal Body Weight:  67 kg  BMI:  Body mass index is 19.08 kg/m.  Estimated  Nutritional Needs:   Kcal:  1700-2000kcal/day  Protein:  85-100g/day  Fluid:  >1.7L/day  08/03/19 MS, RD, LDN Contact information available in Amion

## 2019-08-01 NOTE — Progress Notes (Signed)
Occupational Therapy Evaluation Patient Details Name: Austin Blevins MRN: [id number] DOB: 1960-07-28 Today's Date: 08/01/2019    History of Present Illness Austin Blevins is a 59 y.o. male with medical history significant for hydrocephalus with known VP shunt, severe COPD with chronic respiratory failure on home O2 at 3 L, anxiety and restless leg syndrome who presented to the emergency room with severe respiratory distress and complaint of left  lateral chest pain and mid to lower back pain. Pain is worse with movement and coughing and is of moderate intensity described as sharp and nonradiating. CTA of his chest which was negative for PE but showed advanced emphysema and stable pulmonary nodules also showed a "mild T7 inferior endplate compression fracture is new." Pt t/f to [facility] on 07/22/2019 d/t less intelligible speech. Repeat CT unchanged. Neurology was consulted and felt pt might be delerious. Precedex drip was started that has since been discontinued. Seen for PT re-evaluation this AM.   Clinical Impression   Patient seen this afternoon for OT session and agreeable to therapy.  Patient obtained TLSO this date and order is to wear during ambulation.  Patient verbalized no pain this date and resting on 4.5L of supplemental 02.  Patient notably with more anxiety this date and demonstrating poor impulse control.  Patient attempting to stand/transfer/walk without assistance and demonstrating limited awareness of cords and other trip hazards within room.  OT session targeting ADL item retrieval, safety awareness, activity tolerance and use of EC techniques.  Performed item retrieval in room with use of RW and CGA while wearing TLSO.  Continues to require cues for safety and body mechanics (e.g. bending over to pick up items on floor while standing).  Supplemental 02 put on 5L and maintained above 85% during activity.  VCs to utilize PLB during functional tasks.  Provided education on  tightening/loosening/donning/doffing TLSO while seated in recliner; would benefit from further education.  Based on today's performance, continuing to recommend SNF at this time due to fluctuating 02 levels and limited activity tolerance.  Patient would continue to benefit from skilled occupational therapy to address performance components outlined in evaluation.    Follow Up Recommendations  SNF    Equipment Recommendations  Other (comment)(defer to next level of care)    Recommendations for Other Services       Precautions / Restrictions Precautions Precautions: Fall Required Braces or Orthoses: Other Brace Other Brace: TLSO during ambulation Restrictions Weight Bearing Restrictions: No Other Position/Activity Restrictions: Monitor HR and 02 with activity      Mobility Bed Mobility               General bed mobility comments: Patient seated at EOB upon entry  Transfers Overall transfer level: Needs assistance Equipment used: Rolling walker (2 wheeled);None Transfers: Sit to/from [employer] Sit to Stand: Supervision;Min guard Stand pivot transfers: Supervision;Min guard       General transfer comment: Performed functional transfers with RW and without.  Both requiring CGA-MIN A.  Patient demonstrating decreased impulse control this date, requiring increased VCs for safety regarding 02 line and other obstacles    Balance Overall balance assessment: Needs assistance         Standing balance support: Bilateral upper extremity supported;Single extremity supported;No upper extremity supported;During functional activity   Standing balance comment: Demonstrated fair-good during standing ADls and functional mobility  ADL either performed or assessed with clinical judgement   ADL Overall ADL's : Needs assistance/impaired     Grooming: Wash/dry face;Standing;Min guard;Supervision/safety                                  General ADL Comments: Patient ordered TLSO brace this date. Patient wearing TLSO sitting at EOB.  PRovided education on tightening/donning/doffing while sitting up right.  Patient continues to require assistance.     Vision Patient Visual Report: No change from baseline       Perception     Praxis      Pertinent Vitals/Pain Pain Assessment: (patient did not verbalize pain during session)     Hand Dominance     Extremity/Trunk Assessment             Communication     Cognition Arousal/Alertness: Awake/alert Behavior During Therapy: WFL for tasks assessed/performed Overall Cognitive Status: Within Functional Limits for tasks assessed                                 General Comments: Pleasant and cooperative with session, however, demonstrating more anxiety and deficits in impulse control/safety awareness   General Comments  HR between 117-134 during functional activities.  Bumped 02 to 5L with functional mobility.  Remained above 85% with VCs for PLB    Exercises Other Exercises Other Exercises: Patient seated at EOB upon arrival and moved to recliner.  Provided education on importance of waiting for staff to assist with transfers/toileting at this time due to tremors, SOB and amount of equipment that are trip hazards Other Exercises: Completed functional mobility with RW focusing on item retrieval and safety awareness to improve ability to safely perform ADls in home setting.   Shoulder Instructions      Home Living                                          Prior Functioning/Environment                   OT Problem List:        OT Treatment/Interventions:      OT Goals(Current goals can be found in the care plan section)    OT Frequency: Min 1X/week   Barriers to D/C:            Co-evaluation              AM-PAC OT "6 Clicks" Daily Activity     Outcome Measure Help from another person eating  meals?: None Help from another person taking care of personal grooming?: A Little(CGA/SBA if standing. set up if seated.) Help from another person toileting, which includes using toliet, bedpan, or urinal?: A Little Help from another person bathing (including washing, rinsing, drying)?: A Little Help from another person to put on and taking off regular upper body clothing?: None(Requires assistance for TLSO) Help from another person to put on and taking off regular lower body clothing?: A Little 6 Click Score: 20   End of Session Equipment Utilized During Treatment: Rolling walker;Gait belt;Back brace(TLSO during ambulation)  Activity Tolerance: Patient tolerated treatment well(Patient notably more anxious this date with poor impulse control) Patient left: in chair;with call bell/phone within reach;with chair alarm set  OT Visit Diagnosis: Repeated falls (R29.6);Other abnormalities of gait and mobility (R26.89);Feeding difficulties (R63.3)                Time: 4098-1191 OT Time Calculation (min): 29 min Charges:  OT General Charges $OT Visit: 1 Visit OT Treatments $Self Care/Home Management : 23-37 mins  Louanne Belton, MS, OTR/L 08/01/19, 4:41 PM

## 2019-08-01 NOTE — Progress Notes (Signed)
Called number for [facility] that was attached to today's order for a new back brace. Individual stated it would have to be ordered from a third party, but that most likely it could be delivered prior to the end of the day to the patient's room. Jari Favre [facility]

## 2019-08-01 NOTE — Progress Notes (Signed)
PROGRESS NOTE    Austin Blevins  ZOX:[id number] DOB: [birth year] DOA: 07/20/2019 PCP: [name], NP  Brief Narrative:  Austin Blevins Amyla Blevins 59 y.o.malewith medical history significant forhydrocephalus with known VP shunt, severe COPD with chronic respiratory failure on home O2 at 3 L, anxiety and restless leg syndrome who presented to the emergency room with severe respiratory distress and complaint ofleftlateral chest pain and mid to lowerback pain. Symptoms have been ongoing for 4 days. He was admitted with Austin Blevins COPD exacerbation.  Currently on steroid taper and antibiotics.  He's also had difficult to control back pain thought 2/2 his T7 compression fracture.  He's had delirium necessitating precedex gtt on 2 occasions and difficult to control restless leg requiring adjustment of requip and addition of gabapentin.  Overall he appears to be improving and is approaching ready for SNF.  Assessment & Plan:   Principal Problem:   COPD with acute exacerbation (Austin Blevins) Active Problems:   Chronic respiratory failure with hypoxia (HCC)   Pulmonary nodules   Chest pain at rest   Thoracic vertebral fracture (HCC)   COPD exacerbation (HCC)   Visual hallucinations  Acute on chronic respiratory failure with hypoxia (HCC)  COPD Exacerbation  COPD on 2 - 3 L at baseline (on 2 L at rest and 3 L with activity): The patient uses O2 at 3L by [place] at home chronically. Currently requiring 4 L.  He'd been treated with steroids and pain meds by his pulmonologist prior to admission.  - wheezing improved today on steroids (taper to home dose)  - continue antibiotics -> narrow (last dose 3/17 for 7 day course)  - IS, flutter   - repeat CXR 3/11 notable for bilateral lung opacities with mild loculated L effusion (discussed with rads who noted this appeared chronic - noted on 03/2018 imaging)  Acute Metabolic Encephalopathy  Delirium:  On 3/6 pt with worsening tremors, difficult to understand speech.   Negative head CT.  Thought to be delirious at that time.  Overnight 3/10-11 he had increasing restlessness and restless leg symptoms, encephalopathic again.  ? If this was related to requip dose.  Was restarted on precedex at this time, now d/c'd.  Continues to be encephalopathic with lethargy today.  - Stop librium  - Continue seroquel (50 mg qhs and 25 mg qam)  - Continue ropinirole for restless legs, which is worsening delirium (will increase to 4 mg at bedtime -> follow hallucinations, maybe worsened with this)  - gabapentin for RLS  - appreciate neurology input -> suspected 2/2 delirium in setting of poor oxygenation -> seroquel if needed  - psychiatry has seen him, discontinued trazodone and buspar - seroquel adjusted  - s/p precedex,librium as above.  Suggestion of benzo withdrawal from previous note, but I don't see this in home medlist or PMP aware.  Apparently takes meds from friends at times (unknown quantity - ? Valium, percocets). Will discuss with sister (sounds like he's not taking benzos that consistently?).   Sinus Tachycardia: intermittent, maybe related to discomfort with RLS?  Will continue to monitor.  TSH wnl, free T4 slightly low.  Follow echo (normal ef, see report)  Started on metoprolol  Visual hallucinations: Likely due to Austin Blevins medication, ? Benzo withdrawal. Home medications that may be to blame include Hycodan, requip, Zoloft, or buspar. Also takes unknown quantity of ativan and medications that he gets from his friends occasionally.   - appreciate psych eval  Hypotension: Resolved.  Pulmonary nodules: CTA chest demonstrated  unchanged bilateral pulmonary nodules with the largest in the right lower lobe. Follow is recommended as per previous recommendations per radiology.  Abdominal distention: Abdominal film raised Austin Blevins question of perigastric pneumoperitoniem. CT abdomen was performed which showed no obstruction, moderate stool load throughout the colonic confines, and  no pneumoperitoneum.   Chest pain: Seems to be primarily with cough.  EKG without ischemic changes at presentation and negative troponins x 4.    - continue hycodan for cough  - voltaren, lidocaine patch    Acute T7 vertebral body fracture: Most certainly largely to blame for the patient's discomfort, but also possibly Austin Blevins consequence of the patient's cough due to acute bronchitis and his chronic on and off steroid use for severe COPD.  Discussed with neurosurgery -> plan for TLSO brace and outpatient follow up with neurosurgery.  Chronic tremors:  Chronic. Improving. Neurology does not see anything new in CT head.  Follow outpatient with neurology.  Anxiety: Continue BuSpar  Hydrocephalus with history of VP shunt: No acute concerns at this time related to the hydrocephalus itself.  Severe protein calorie malnutrition: Nutrition consulted. Supplements ordered.  Abnormal TSH - follow free T4  Elevated LFT's:  Follow hepatitis panel (negative), ctm  - previous abdominal imaging with grossly unremarkable liver  Prediabetes: ssi, levemir  DVT prophylaxis: lovenox Code Status: DNR Family Communication: none currently at bedside - sister 3/15 Disposition Plan:  . Patient came from: home            . Anticipated d/c place: SNF . Barriers to d/c OR conditions which need to be met to effect Austin Blevins safe d/c: improvement in pain control, respiratory status, placement for SNF   Consultants:   Neurology  Critical care  Procedures:  Echo IMPRESSIONS    1. Left ventricular ejection fraction, by estimation, is 60 to 65%. The  left ventricle has normal function. The left ventricle has no regional  wall motion abnormalities. Left ventricular diastolic parameters are  consistent with Grade I diastolic  dysfunction (impaired relaxation).  2. Right ventricular systolic function is normal. The right ventricular  size is normal. There is mildly elevated pulmonary artery systolic  pressure.   3. The mitral valve is normal in structure. No evidence of mitral valve  regurgitation. No evidence of mitral stenosis.  4. The aortic valve is normal in structure. Aortic valve regurgitation is  not visualized. No aortic stenosis is present.  5. The inferior vena cava is normal in size with greater than 50%  respiratory variability, suggesting right atrial pressure of 3 mmHg.   Antimicrobials: Anti-infectives (From admission, onward)   Start     Dose/Rate Route Frequency Ordered Stop   07/30/19 1000  cefTRIAXone (ROCEPHIN) 2 g in sodium chloride 0.9 % 100 mL IVPB     2 g 200 mL/hr over 30 Minutes Intravenous Every 24 hours 07/30/19 0924     07/27/19 1900  ceFEPIme (MAXIPIME) 2 g in sodium chloride 0.9 % 100 mL IVPB  Status:  Discontinued     2 g 200 mL/hr over 30 Minutes Intravenous Every 8 hours 07/27/19 1820 07/30/19 0924   07/22/19 2200  vancomycin (VANCOREADY) IVPB 1250 mg/250 mL  Status:  Discontinued     1,250 mg 166.7 mL/hr over 90 Minutes Intravenous Every 24 hours 07/21/19 2119 07/22/19 1657   07/21/19 2200  piperacillin-tazobactam (ZOSYN) IVPB 3.375 g  Status:  Discontinued     3.375 g 12.5 mL/hr over 240 Minutes Intravenous Every 8 hours 07/21/19 2117 07/22/19 1657  07/21/19 2130  vancomycin (VANCOREADY) IVPB 1250 mg/250 mL     1,250 mg 166.7 mL/hr over 90 Minutes Intravenous  Once 07/21/19 2117 07/22/19 0120   07/20/19 1830  azithromycin (ZITHROMAX) 500 mg in sodium chloride 0.9 % 250 mL IVPB     500 mg 250 mL/hr over 60 Minutes Intravenous  Once 07/20/19 1824 07/20/19 1956     Subjective: No new complaints  Objective: Vitals:   08/01/19 1303 08/01/19 1807 08/01/19 1949 08/01/19 2041  BP: 105/81 125/77  114/86  Pulse: 84 (!) 101  (!) 108  Resp:      Temp: 97.7 F (36.5 C) 97.6 F (36.4 C)  97.8 F (36.6 C)  TempSrc: Oral Oral  Oral  SpO2: 96% 97% 94% 95%  Weight:      Height:        Intake/Output Summary (Last 24 hours) at 08/01/2019 2050 Last data  filed at 08/01/2019 1830 Gross per 24 hour  Intake 480 ml  Output 1265 ml  Net -785 ml   Filed Weights   07/30/19 0400 07/30/19 1046 08/01/19 0409  Weight: 56.2 kg 56.1 kg 56.9 kg    Examination:  General: No acute distress. Cardiovascular: Heart sounds show Desi Rowe regular rate, and rhythm. Lungs: Clear to auscultation bilaterally Abdomen: Soft, nontender, nondistended Neurological: Alert and oriented 3. Moves all extremities 4 . Cranial nerves II through XII grossly intact. Skin: Warm and dry. No rashes or lesions. Extremities: No clubbing or cyanosis. No edema.   Data Reviewed: I have personally reviewed following labs and imaging studies  CBC: Recent Labs  Lab 07/28/19 0632 07/29/19 0505 07/30/19 0541 07/31/19 0424 08/01/19 0519  WBC 10.3 11.6* 12.0* 14.3* 14.3*  NEUTROABS 9.3* 10.0* 10.5* 11.8* 11.4*  HGB 12.8* 12.5* 11.8* 11.7* 11.7*  HCT 40.4 38.9* 36.7* 37.5* 37.8*  MCV 95.3 96.0 96.3 98.2 99.5  PLT 208 222 206 213 378   Basic Metabolic Panel: Recent Labs  Lab 07/28/19 0632 07/29/19 0505 07/30/19 0541 07/31/19 0424 08/01/19 0519  NA 138 140 140 140 143  K 4.5 3.8 4.5 3.9 4.6  CL 101 101 99 98 97*  CO2 28 32 35* 35* 35*  GLUCOSE 196* 113* 153* 157* 104*  BUN 20 23* 24* 27* 34*  CREATININE 1.01 1.03 0.94 1.03 1.00  CALCIUM 8.5* 8.6* 8.9 8.9 9.1  MG 2.1 2.3 2.1 2.1 2.3  PHOS 4.5 3.6 2.8 3.0 3.8   GFR: Estimated Creatinine Clearance: 65.6 mL/min (by C-G formula based on SCr of 1 mg/dL). Liver Function Tests: Recent Labs  Lab 07/28/19 0632 07/29/19 0505 07/30/19 0541 07/31/19 0424 08/01/19 0519  AST 28 34 47* 43* 47*  ALT 40 51* 92* 101* 123*  ALKPHOS 73 74 72 79 85  BILITOT 0.6 0.4 0.5 0.6 0.6  PROT 6.1* 6.1* 5.8* 5.8* 5.9*  ALBUMIN 3.0* 3.2* 2.9* 3.0* 3.0*   No results for input(s): LIPASE, AMYLASE in the last 168 hours. No results for input(s): AMMONIA in the last 168 hours. Coagulation Profile: No results for input(s): INR, PROTIME in the  last 168 hours. Cardiac Enzymes: No results for input(s): CKTOTAL, CKMB, CKMBINDEX, TROPONINI in the last 168 hours. BNP (last 3 results) No results for input(s): PROBNP in the last 8760 hours. HbA1C: No results for input(s): HGBA1C in the last 72 hours. CBG: Recent Labs  Lab 07/31/19 2347 08/01/19 0407 08/01/19 0819 08/01/19 1232 08/01/19 1730  GLUCAP 154* 106* 89 240* 241*   Lipid Profile: No results for input(s): CHOL, HDL,  LDLCALC, TRIG, CHOLHDL, LDLDIRECT in the last 72 hours. Thyroid Function Tests: No results for input(s): TSH, T4TOTAL, FREET4, T3FREE, THYROIDAB in the last 72 hours. Anemia Panel: No results for input(s): VITAMINB12, FOLATE, FERRITIN, TIBC, IRON, RETICCTPCT in the last 72 hours. Sepsis Labs: Recent Labs  Lab 07/28/19 0632  PROCALCITON 0.11    No results found for this or any previous visit (from the past 240 hour(s)).       Radiology Studies: ECHOCARDIOGRAM COMPLETE  Result Date: 08/01/2019    ECHOCARDIOGRAM REPORT   Patient Name:   JEANETTE MOFFATT Date of Exam: 07/31/2019 Medical Rec #:  [id number]     Height:       68.0 in Accession #:    [id number]    Weight:       123.7 lb Date of Birth:  [birth year]     BSA:          1.666 m Patient Age:    28 years      BP:           96/62 mmHg Patient Gender: M             HR:           84 bpm. Exam Location:  [facility] Procedure: 2D Echo, Color Doppler and Cardiac Doppler Indications:    Tachycardia  History:        Patient has no prior history of Echocardiogram examinations.                 COPD.  Sonographer:    Charmayne Sheer RDCS (AE) Referring Phys: [place] MD Phys:  Sonographer Comments: Suboptimal apical window and suboptimal parasternal window. IMPRESSIONS  1. Left ventricular ejection fraction, by estimation, is 60 to 65%. The left ventricle has normal function. The left ventricle has no regional wall motion abnormalities. Left ventricular diastolic parameters are  consistent with Grade I diastolic dysfunction (impaired relaxation).  2. Right ventricular systolic function is normal. The right ventricular size is normal. There is mildly elevated pulmonary artery systolic pressure.  3. The mitral valve is normal in structure. No evidence of mitral valve regurgitation. No evidence of mitral stenosis.  4. The aortic valve is normal in structure. Aortic valve regurgitation is not visualized. No aortic stenosis is present.  5. The inferior vena cava is normal in size with greater than 50% respiratory variability, suggesting right atrial pressure of 3 mmHg. FINDINGS  Left Ventricle: Left ventricular ejection fraction, by estimation, is 60 to 65%. The left ventricle has normal function. The left ventricle has no regional wall motion abnormalities. The left ventricular internal cavity size was normal in size. There is  no left ventricular hypertrophy. Left ventricular diastolic parameters are consistent with Grade I diastolic dysfunction (impaired relaxation). Right Ventricle: The right ventricular size is normal. No increase in right ventricular wall thickness. Right ventricular systolic function is normal. There is mildly elevated pulmonary artery systolic pressure. The tricuspid regurgitant velocity is 2.54  m/s, and with an assumed right atrial pressure of 10 mmHg, the estimated right ventricular systolic pressure is 22.2 mmHg. Left Atrium: Left atrial size was normal in size. Right Atrium: Right atrial size was normal in size. Pericardium: There is no evidence of pericardial effusion. Mitral Valve: The mitral valve is normal in structure. Normal mobility of the mitral valve leaflets. No evidence of mitral valve regurgitation. No evidence of mitral valve stenosis. MV peak gradient, 3.6  mmHg. The mean mitral valve gradient is 1.0 mmHg. Tricuspid Valve: The tricuspid valve is normal in structure. Tricuspid valve regurgitation is not demonstrated. No evidence of tricuspid stenosis.  Aortic Valve: The aortic valve is normal in structure. Aortic valve regurgitation is not visualized. No aortic stenosis is present. Aortic valve mean gradient measures 3.0 mmHg. Aortic valve peak gradient measures 6.6 mmHg. Aortic valve area, by VTI measures 2.60 cm. Pulmonic Valve: The pulmonic valve was normal in structure. Pulmonic valve regurgitation is not visualized. No evidence of pulmonic stenosis. Aorta: The aortic root is normal in size and structure. Venous: The inferior vena cava is normal in size with greater than 50% respiratory variability, suggesting right atrial pressure of 3 mmHg. IAS/Shunts: No atrial level shunt detected by color flow Doppler.  LEFT VENTRICLE PLAX 2D LVIDd:         3.73 cm  Diastology LVIDs:         2.09 cm  LV e' lateral:   9.90 cm/s LV PW:         1.01 cm  LV E/e' lateral: 9.1 LV IVS:        0.68 cm  LV e' medial:    9.90 cm/s LVOT diam:     1.90 cm  LV E/e' medial:  9.1 LV SV:         50 LV SV Index:   30 LVOT Area:     2.84 cm  RIGHT VENTRICLE RV Basal diam:  2.86 cm LEFT ATRIUM           Index       RIGHT ATRIUM           Index LA diam:      2.00 cm 1.20 cm/m  RA Area:     10.30 cm LA Vol (A2C): 7.1 ml  4.24 ml/m  RA Volume:   21.40 ml  12.84 ml/m LA Vol (A4C): 18.2 ml 10.92 ml/m  AORTIC VALVE                   PULMONIC VALVE AV Area (Vmax):    2.33 cm    PV Vmax:       0.93 m/s AV Area (Vmean):   2.64 cm    PV Vmean:      65.700 cm/s AV Area (VTI):     2.60 cm    PV VTI:        0.173 m AV Vmax:           128.00 cm/s PV Peak grad:  3.4 mmHg AV Vmean:          73.300 cm/s PV Mean grad:  2.0 mmHg AV VTI:            0.192 m AV Peak Grad:      6.6 mmHg AV Mean Grad:      3.0 mmHg LVOT Vmax:         105.00 cm/s LVOT Vmean:        68.300 cm/s LVOT VTI:          0.176 m LVOT/AV VTI ratio: 0.92  AORTA Ao Root diam: 2.90 cm MITRAL VALVE               TRICUSPID VALVE MV Area (PHT): 3.53 cm    TR Peak grad:   25.8 mmHg MV Peak grad:  3.6 mmHg    TR Vmax:        254.00 cm/s  MV Mean grad:  1.0 mmHg  MV Vmax:       0.95 m/s    SHUNTS MV Vmean:      53.5 cm/s   Systemic VTI:  0.18 m MV Decel Time: 215 msec    Systemic Diam: 1.90 cm MV E velocity: 89.70 cm/s MV Loralee Weitzman velocity: 81.50 cm/s MV E/Sandeep Radell ratio:  1.10 Neoma Laming MD Electronically signed by Neoma Laming MD Signature Date/Time: 08/01/2019/9:46:44 AM    Final         Scheduled Meds: . budesonide (PULMICORT) nebulizer solution  0.5 mg Nebulization BID  . Chlorhexidine Gluconate Cloth  6 each Topical Daily  . diclofenac Sodium  2 g Topical QID  . enoxaparin (LOVENOX) injection  40 mg Subcutaneous Q24H  . feeding supplement (ENSURE ENLIVE)  237 mL Oral BID BM  . folic acid  1 mg Oral Daily  . gabapentin  300 mg Oral TID  . insulin aspart  0-15 Units Subcutaneous TID WC  . insulin aspart  0-5 Units Subcutaneous QHS  . insulin detemir  5 Units Subcutaneous BID  . ipratropium-albuterol  3 mL Nebulization TID  . lidocaine  1 patch Transdermal Q24H  . metoprolol tartrate  12.5 mg Oral BID  . multivitamin with minerals  1 tablet Oral Daily  . nicotine  21 mg Transdermal Daily  . pantoprazole  40 mg Oral Daily  . predniSONE  30 mg Oral Q breakfast   Followed by  . [START ON 08/03/2019] predniSONE  20 mg Oral Q breakfast   Followed by  . [START ON 08/05/2019] predniSONE  10 mg Oral Q breakfast  . QUEtiapine  25 mg Oral Daily  . QUEtiapine  50 mg Oral QHS  . rOPINIRole  1 mg Oral q AM  . rOPINIRole  4 mg Oral QHS  . sertraline  150 mg Oral Daily  . thiamine  100 mg Oral Daily  . tiotropium  18 mcg Inhalation Daily   Continuous Infusions: . cefTRIAXone (ROCEPHIN)  IV 2 g (08/01/19 1126)     LOS: 11 days    Time spent: over 30 min    Fayrene Helper, MD [facility]   To contact the attending provider between 7A-7P or the covering provider during after hours 7P-7A, please log into the web site [email or web address] and access using universal [place] password for that web site. If you do not have the  password, please call the hospital operator.  08/01/2019, 8:50 PM

## 2019-08-01 NOTE — Care Management Important Message (Signed)
Important Message  Patient Details  Name: Austin Blevins MRN: [id number] Date of Birth: Nov 01, 1960   Medicare Important Message Given:  Yes     Johnell Comings 08/01/2019, 10:52 AM

## 2019-08-01 NOTE — Plan of Care (Signed)
  Problem: Education: Goal: Knowledge of General Education information will improve Description: Including pain rating scale, medication(s)/side effects and non-pharmacologic comfort measures Outcome: Progressing   Problem: Health Behavior/Discharge Planning: Goal: Ability to manage health-related needs will improve Outcome: Progressing Note: Pt. received a back brace to assist with easier mobility while ambulating. Instructed on use by equipment rep who delivered item. Will continue to monitor ambulation tolerance for remainder of the shift. PT following. Jari Favre [facility]

## 2019-08-01 NOTE — Progress Notes (Signed)
Orthopedic Tech Progress Note Patient Details:  Austin Blevins [birth year] [id number]  Patient ID: [name], male   DOB: [birth year], 59 y.o.   MRN: [id number]   [name] 08/01/2019, 12:55 PMCalled [employer] for TLSO brace.

## 2019-08-02 LAB — COMPREHENSIVE METABOLIC PANEL
ALT: 123 U/L — ABNORMAL HIGH (ref 0–44)
AST: 38 U/L (ref 15–41)
Albumin: 2.9 g/dL — ABNORMAL LOW (ref 3.5–5.0)
Alkaline Phosphatase: 85 U/L (ref 38–126)
Anion gap: 9 (ref 5–15)
BUN: 35 mg/dL — ABNORMAL HIGH (ref 6–20)
CO2: 34 mmol/L — ABNORMAL HIGH (ref 22–32)
Calcium: 8.8 mg/dL — ABNORMAL LOW (ref 8.9–10.3)
Chloride: 98 mmol/L (ref 98–111)
Creatinine, Ser: 1.05 mg/dL (ref 0.61–1.24)
GFR calc Af Amer: >60 mL/min (ref >60)
GFR calc non Af Amer: >60 mL/min (ref >60)
Glucose, Bld: 117 mg/dL — ABNORMAL HIGH (ref 70–99)
Potassium: 4.1 mmol/L (ref 3.5–5.1)
Sodium: 141 mmol/L (ref 135–145)
Total Bilirubin: 0.5 mg/dL (ref 0.3–1.2)
Total Protein: 5.7 g/dL — ABNORMAL LOW (ref 6.5–8.1)

## 2019-08-02 LAB — CBC WITH DIFFERENTIAL/PLATELET
Abs Immature Granulocytes: 0.5 10*3/uL — ABNORMAL HIGH (ref 0.00–0.07)
Basophils Absolute: 0.1 10*3/uL (ref 0.0–0.1)
Basophils Relative: 1 %
Eosinophils Absolute: 0.2 10*3/uL (ref 0.0–0.5)
Eosinophils Relative: 2 %
HCT: 36.8 % — ABNORMAL LOW (ref 39.0–52.0)
Hemoglobin: 11.5 g/dL — ABNORMAL LOW (ref 13.0–17.0)
Immature Granulocytes: 4 %
Lymphocytes Relative: 14 %
Lymphs Abs: 1.7 10*3/uL (ref 0.7–4.0)
MCH: 30.4 pg (ref 26.0–34.0)
MCHC: 31.3 g/dL (ref 30.0–36.0)
MCV: 97.4 fL (ref 80.0–100.0)
Monocytes Absolute: 0.5 10*3/uL (ref 0.1–1.0)
Monocytes Relative: 4 %
Neutro Abs: 9.4 10*3/uL — ABNORMAL HIGH (ref 1.7–7.7)
Neutrophils Relative %: 75 %
Platelets: 186 10*3/uL (ref 150–400)
RBC: 3.78 MIL/uL — ABNORMAL LOW (ref 4.22–5.81)
RDW: 12.6 % (ref 11.5–15.5)
WBC: 12.5 10*3/uL — ABNORMAL HIGH (ref 4.0–10.5)
nRBC: 0 % (ref 0.0–0.2)

## 2019-08-02 LAB — GLUCOSE, CAPILLARY
Glucose-Capillary: 100 mg/dL — ABNORMAL HIGH (ref 70–99)
Glucose-Capillary: 287 mg/dL — ABNORMAL HIGH (ref 70–99)

## 2019-08-02 LAB — MAGNESIUM: Magnesium: 2.3 mg/dL (ref 1.7–2.4)

## 2019-08-02 MED ORDER — OXYCODONE HCL 5 MG PO TABS: 5.0000 mg | ORAL_TABLET | Freq: Three times a day (TID) | ORAL | 0 refills | Status: DC | PRN

## 2019-08-02 MED ORDER — METOPROLOL TARTRATE 25 MG PO TABS: 12.5000 mg | ORAL_TABLET | Freq: Two times a day (BID) | ORAL | 2 refills | Status: DC

## 2019-08-02 NOTE — [place] FL2 (Signed)
[place] MEDICAID FL2 LEVEL OF CARE SCREENING TOOL     IDENTIFICATION  Patient Name: Austin Blevins Birthdate: 1960/06/12 Sex: male Admission Date (Current Location): 07/20/2019  [place] Number:  Chiropodist and Address:  [facility], [address], [place], [place] [address]      Provider Number: [id number]  Attending Physician Name and Address:  Darlin Priestly, MD  Relative Name and Phone Number:  Normand Sloop [phone]    Current Level of Care: Hospital Recommended Level of Care: Skilled Nursing Facility Prior Approval Number:    Date Approved/Denied:   PASRR Number:    Discharge Plan: SNF    Current Diagnoses: Patient Active Problem List   Diagnosis Date Noted  . Visual hallucinations   . Hydrocephalus (HCC) 07/21/2019  . Thoracic vertebral fracture (HCC) 07/21/2019  . COPD exacerbation (HCC) 07/21/2019  . COPD with acute exacerbation (HCC) 07/20/2019  . Chest pain at rest 07/20/2019  . Pulmonary nodules 05/27/2019  . Low back pain 12/21/2018  . DNR (do not resuscitate) 10/24/2018  . Restless legs syndrome (RLS) 08/02/2018  . Gastroesophageal reflux disease 08/02/2018  . Rotator cuff dysfunction, left 07/19/2018  . Prediabetes 07/17/2018  . Nicotine dependence, cigarettes, w unsp disorders 05/25/2017  . Chronic pancreatitis (HCC) 05/25/2017  . Stage 3 severe COPD by GOLD classification (HCC) 02/11/2017  . Protein-calorie malnutrition, severe 01/22/2017  . Chronic respiratory failure with hypoxia (HCC) 01/21/2017  . Depression, major, recurrent, moderate (HCC) 04/11/2014    Orientation RESPIRATION BLADDER Height & Weight     Self, Time, Situation  O2 Continent Weight: 57.5 kg Height:  5\' 8"  (172.7 cm)  BEHAVIORAL SYMPTOMS/MOOD NEUROLOGICAL BOWEL NUTRITION STATUS      Continent Diet(Dysphagia 3)  AMBULATORY STATUS COMMUNICATION OF NEEDS Skin   Limited Assist Verbally Normal                        Personal Care Assistance Level of Assistance  Bathing, Feeding, Dressing Bathing Assistance: Limited assistance Feeding assistance: Limited assistance Dressing Assistance: Limited assistance     Functional Limitations Info             SPECIAL CARE FACTORS FREQUENCY  OT (By licensed OT), PT (By licensed PT)                    Contractures Contractures Info: Not present    Additional Factors Info  Code Status, Allergies Code Status Info: DNR Allergies Info: Acetaminophen, Percocet           Current Medications (08/02/2019):  This is the current hospital active medication list Current Facility-Administered Medications  Medication Dose Route Frequency Provider Last Rate Last Admin  . albuterol (PROVENTIL) (2.5 MG/3ML) 0.083% nebulizer solution 2.5 mg  2.5 mg Nebulization Q6H PRN Swayze, Ava, DO   2.5 mg at 07/21/19 2035  . budesonide (PULMICORT) nebulizer solution 0.5 mg  0.5 mg Nebulization BID 2036, NP   0.5 mg at 08/02/19 0803  . cefTRIAXone (ROCEPHIN) 2 g in sodium chloride 0.9 % 100 mL IVPB  2 g Intravenous Q24H 08/04/19., MD 200 mL/hr at 08/01/19 1126 2 g at 08/01/19 1126  . Chlorhexidine Gluconate Cloth 2 % PADS 6 each  6 each Topical Daily Swayze, Ava, DO   6 each at 07/30/19 2307  . diclofenac Sodium (VOLTAREN) 1 % topical gel 2 g  2 g Topical QID 2308., MD  2 g at 08/01/19 2052  . enoxaparin (LOVENOX) injection 40 mg  40 mg Subcutaneous Q24H Swayze, Ava, DO   40 mg at 08/01/19 2123  . feeding supplement (ENSURE ENLIVE) (ENSURE ENLIVE) liquid 237 mL  237 mL Oral BID BM Swayze, Ava, DO   237 mL at 08/01/19 1259  . folic acid (FOLVITE) tablet 1 mg  1 mg Oral Daily Flora Lipps, MD   1 mg at 08/02/19 0939  . gabapentin (NEURONTIN) capsule 300 mg  300 mg Oral TID Elodia Florence., MD   300 mg at 08/02/19 4259  . haloperidol lactate (HALDOL) injection 2 mg  2 mg Intravenous Q6H PRN Swayze, Ava, DO   2 mg at 07/27/19  0042  . HYDROcodone-homatropine (HYCODAN) 5-1.5 MG/5ML syrup 5 mL  5 mL Oral Q8H PRN Awilda Bill, NP   5 mL at 07/28/19 2124  . insulin aspart (novoLOG) injection 0-15 Units  0-15 Units Subcutaneous TID WC Elodia Florence., MD   5 Units at 08/01/19 1735  . insulin aspart (novoLOG) injection 0-5 Units  0-5 Units Subcutaneous QHS Elodia Florence., MD   2 Units at 08/01/19 2123  . insulin aspart (novoLOG) injection 3 Units  3 Units Subcutaneous TID WC Elodia Florence., MD   3 Units at 08/02/19 [phone]  . insulin detemir (LEVEMIR) injection 5 Units  5 Units Subcutaneous BID Elodia Florence., MD   5 Units at 08/02/19 [phone]  . ipratropium-albuterol (DUONEB) 0.5-2.5 (3) MG/3ML nebulizer solution 3 mL  3 mL Nebulization TID Elodia Florence., MD   3 mL at 08/02/19 0803  . lidocaine (LIDODERM) 5 % 1 patch  1 patch Transdermal Q24H Elodia Florence., MD   1 patch at 08/02/19 [phone]  . metoprolol tartrate (LOPRESSOR) injection 2.5 mg  2.5 mg Intravenous Q6H PRN Elodia Florence., MD   2.5 mg at 07/30/19 1806  . metoprolol tartrate (LOPRESSOR) tablet 12.5 mg  12.5 mg Oral BID Elodia Florence., MD   12.5 mg at 08/02/19 0939  . multivitamin with minerals tablet 1 tablet  1 tablet Oral Daily Swayze, Ava, DO   1 tablet at 08/02/19 0939  . nicotine (NICODERM CQ - dosed in mg/24 hours) patch 21 mg  21 mg Transdermal Daily Elodia Florence., MD   21 mg at 08/02/19 0940  . oxyCODONE (Oxy IR/ROXICODONE) immediate release tablet 5 mg  5 mg Oral Q4H PRN Elodia Florence., MD   5 mg at 08/02/19 0829  . pantoprazole (PROTONIX) EC tablet 40 mg  40 mg Oral Daily Swayze, Ava, DO   40 mg at 08/02/19 0939  . [START ON 08/03/2019] predniSONE (DELTASONE) tablet 20 mg  20 mg Oral Q breakfast Elodia Florence., MD       Followed by  . [START ON 08/05/2019] predniSONE (DELTASONE) tablet 10 mg  10 mg Oral Q breakfast Elodia Florence., MD      . QUEtiapine (SEROQUEL) tablet  25 mg  25 mg Oral Daily Cristofano, Dorene Ar, MD   25 mg at 08/02/19 0939  . QUEtiapine (SEROQUEL) tablet 50 mg  50 mg Oral QHS Cristofano, Dorene Ar, MD   50 mg at 08/01/19 2049  . rOPINIRole (REQUIP) tablet 1 mg  1 mg Oral q AM Swayze, Ava, DO   1 mg at 08/02/19 0939  . rOPINIRole (REQUIP) tablet 4 mg  4 mg Oral QHS Powell, A  Charlyn Minerva., MD   4 mg at 08/01/19 2048  . sertraline (ZOLOFT) tablet 150 mg  150 mg Oral Daily Swayze, Ava, DO   150 mg at 08/02/19 0939  . thiamine tablet 100 mg  100 mg Oral Daily Erin Fulling, MD   100 mg at 08/02/19 0939  . tiotropium (SPIRIVA) inhalation capsule ([facility] use ONLY) 18 mcg  18 mcg Inhalation Daily Swayze, Ava, DO   18 mcg at 08/02/19 7340     Discharge Medications: Please see discharge summary for a list of discharge medications.  Relevant Imaging Results:  Relevant Lab Results:   Additional Information SS# [id number]  Trenton Founds, RN

## 2019-08-02 NOTE — Progress Notes (Signed)
Physical Therapy Treatment Patient Details Name: Austin Blevins MRN: [id number] DOB: [birth year] Today's Date: 08/02/2019    History of Present Illness Austin Blevins is a 59 y.o. male with medical history significant for hydrocephalus with known VP shunt, severe COPD with chronic respiratory failure on home O2 at 3 L, anxiety and restless leg syndrome who presented to the emergency room with severe respiratory distress and complaint of left  lateral chest pain and mid to lower back pain. Pain is worse with movement and coughing and is of moderate intensity described as sharp and nonradiating. CTA of his chest which was negative for PE but showed advanced emphysema and stable pulmonary nodules also showed a "mild T7 inferior endplate compression fracture is new." Pt t/f to IC09 on 07/22/2019 d/t less intelligible speech. Repeat CT unchanged. Neurology was consulted and felt pt might be delerious. Precedex drip was started that has since been discontinued. Seen for PT re-evaluation this AM.    PT Comments    Pt alert, oriented, agreeable to PT, wearing TLSO brace in bed and stated his pain was a 7/10, had just received pain medicine from RN. Pt on 3.5L, placed on 4L for all mobility. Supine to sit mod I, and sit <> stand several times during session, with and without RW. Pt with improved safety with RW but able to perform without and with CGA. The patient ambulated >55ft with RW and CGA, 2-3 standing rest breaks for PLB and to assess spO2 >90%. Pt up in chair, instructed in therapeutic exercises to maintain LE strength. Overall the patient demonstrated progression towards goals and would benefit from further skilled PT intervention. Current recommendation remains appropriate due to decline from PLOF, continued education, and to maximize safety.     Follow Up Recommendations  SNF     Equipment Recommendations  Rolling walker with 5" wheels    Recommendations for Other Services       Precautions /  Restrictions Precautions Precautions: Fall Required Braces or Orthoses: Other Brace Other Brace: TLSO during ambulation Restrictions Weight Bearing Restrictions: No Other Position/Activity Restrictions: Monitor HR and 02 with activity    Mobility  Bed Mobility Overal bed mobility: Modified Independent                Transfers Overall transfer level: Needs assistance Equipment used: Rolling walker (2 wheeled);None Transfers: Sit to/from Stand Sit to Stand: Supervision;Min guard         General transfer comment: Pt able to perform transfers with and without RW, CGA/supervision.  Ambulation/Gait Ambulation/Gait assistance: Min guard Gait Distance (Feet): (>46ft) Assistive device: Rolling walker (2 wheeled)       General Gait Details: Pt tremuluous during activity;endorsed leg fatigue/weakness, decreased step height/length bilaterally, narrow base of support. 2-3 standing rest breaks to assess spO2.   Stairs             Wheelchair Mobility    Modified Rankin (Stroke Patients Only)       Balance Overall balance assessment: Needs assistance Sitting-balance support: Feet supported Sitting balance-Leahy Scale: Normal       Standing balance-Leahy Scale: Good                              Cognition Arousal/Alertness: Awake/alert Behavior During Therapy: WFL for tasks assessed/performed Overall Cognitive Status: Within Functional Limits for tasks assessed  Exercises General Exercises - Lower Extremity Long Arc Quad: AROM;Strengthening;Both;10 reps Hip Flexion/Marching: AROM;Strengthening;Both;10 reps Toe Raises: AROM;Strengthening;Both;10 reps Heel Raises: AROM;Strengthening;Both;10 reps Other Exercises Other Exercises: Pt and PT reviewed donning/doffing of TLSO, pt verbalized and demonstrated understanding. Other Exercises: Pt educated on importance of continued mobility and exercise  program to improve/maintain strength and function.    General Comments        Pertinent Vitals/Pain Pain Assessment: 0-10 Pain Score: 7  Pain Location: Mid/low back pain, reported it travelled to R shoulder blade in standing, but no increase in pain Pain Descriptors / Indicators: Grimacing Pain Intervention(s): Limited activity within patient's tolerance;Repositioned;Monitored during session;Premedicated before session    Home Living                      Prior Function            PT Goals (current goals can now be found in the care plan section) Progress towards PT goals: Progressing toward goals    Frequency    Min 2X/week      PT Plan Current plan remains appropriate    Co-evaluation              AM-PAC PT "6 Clicks" Mobility   Outcome Measure  Help needed turning from your back to your side while in a flat bed without using bedrails?: None Help needed moving from lying on your back to sitting on the side of a flat bed without using bedrails?: None Help needed moving to and from a bed to a chair (including a wheelchair)?: A Little Help needed standing up from a chair using your arms (e.g., wheelchair or bedside chair)?: A Little Help needed to walk in hospital room?: A Little Help needed climbing 3-5 steps with a railing? : A Lot 6 Click Score: 19    End of Session Equipment Utilized During Treatment: Gait belt;Oxygen;Other (comment)(3-4L) Activity Tolerance: Patient tolerated treatment well Patient left: in chair;with call bell/phone within reach;with nursing/sitter in room Nurse Communication: Mobility status PT Visit Diagnosis: Other abnormalities of gait and mobility (R26.89);Muscle weakness (generalized) (M62.81);Other symptoms and signs involving the nervous system (K99.833)     Time: 8250-5397 PT Time Calculation (min) (ACUTE ONLY): 26 min  Charges:  $Therapeutic Exercise: 23-37 mins                    Lieutenant Diego PT, DPT 11:12  AM,08/02/19

## 2019-08-02 NOTE — TOC Progression Note (Signed)
Transition of Care [facility]) - Progression Note    Patient Details  Name: Austin Blevins MRN: [id number] Date of Birth: Aug 02, 1960  [facility]) CM/SW Contact  Trenton Founds, RN Phone Number: 08/02/2019, 3:18 PM  Clinical Narrative:   RNCM received return call from patient's sister [name] to report that she has talked to her brother and if a rehab is anything like a "rest home" he does not want to go. Discussed similarities and differences between "rehab" and "rest home". After discussion sister reports that they have very good family support and they want to take him home. Sister reports that he had never had home health services in the past and they would just like to use whomever the hospital prefers and accepts his insurance. Sister reports that he has both Medicare and Medicaid. Encouraged her to assist him in contacting his case worker and they can help him work towards getting signed up for personal care services and help him determine what he can qualify for. Sister is agreeable and verbalizes appreciation for the information.  Attending MD notified of patient/family decision and need for [facility] orders.  Barbara Cower with [facility] contacted and they will accept referral for patient.     Expected Discharge Plan: Skilled Nursing Facility Barriers to Discharge: Continued Medical Work up  Expected Discharge Plan and Services Expected Discharge Plan: Skilled Nursing Facility In-house Referral: Hospice / Palliative Care(Palliative) Discharge Planning Services: CM Consult Post Acute Care Choice: Skilled Nursing Facility Living arrangements for the past 2 months: Single Family Home                                       Social Determinants of Health (SDOH) Interventions    Readmission Risk Interventions No flowsheet data found.

## 2019-08-02 NOTE — Progress Notes (Signed)
Austin Blevins to be D/C'd Home per MD order.  Discussed prescriptions and follow up appointments with the patient. Prescriptions electronically submitted., medication list explained in detail. Pt and sister verbalized understanding. Personal o2 tank at bedside  Allergies as of 08/02/2019      Reactions   Acetaminophen Other (See Comments)   Syncope   Percocet [oxycodone-acetaminophen]       Medication List    TAKE these medications   albuterol (2.5 MG/3ML) 0.083% nebulizer solution Commonly known as: PROVENTIL USE ONE VIAL IN NEBULIZER EVERY SIX HOURS AS NEEDED FOR WHEEZING OR SHORTNESS OF BREATH   albuterol 108 (90 Base) MCG/ACT inhaler Commonly known as: VENTOLIN HFA INHALE 2 PUFFS INTO THE LUNGS EVERY 4 HOURS AS NEEDED FOR WHEEZING OR SHORTNESS OF BREATH   busPIRone 10 MG tablet Commonly known as: BUSPAR TAKE ONE TABLET TWICE DAILY   Fluticasone-Salmeterol 250-50 MCG/DOSE Aepb Commonly known as: Advair Diskus Inhale 1 puff into the lungs 2 (two) times daily.   HYDROcodone-homatropine 5-1.5 MG/5ML syrup Commonly known as: HYCODAN Take 5 mLs by mouth every 8 (eight) hours as needed.   metoprolol tartrate 25 MG tablet Commonly known as: LOPRESSOR Take 0.5 tablets (12.5 mg total) by mouth 2 (two) times daily.   montelukast 10 MG tablet Commonly known as: SINGULAIR TAKE ONE TABLET AT BEDTIME   omeprazole 20 MG capsule Commonly known as: PRILOSEC TAKE 1 CAPSULE BY MOUTH EVERY DAY   oxyCODONE 5 MG immediate release tablet Commonly known as: Oxy IR/ROXICODONE Take 1 tablet (5 mg total) by mouth every 8 (eight) hours as needed for up to 5 days for severe pain.   predniSONE 10 MG tablet Commonly known as: DELTASONE Take 10 mg by mouth daily with breakfast.   rOPINIRole 0.5 MG tablet Commonly known as: REQUIP TAKE ONE TABLET EVERY MORNING AND TAKE THREE TABLETS AT BEDTIME AS NEEDED FOR RESTLESS LEG SYNDROME   sertraline 100 MG tablet Commonly known as: ZOLOFT Take 1.5  tablets (150 mg total) by mouth daily.   Spiriva HandiHaler 18 MCG inhalation capsule Generic drug: tiotropium INHALE CONTENTS OF 1 CAPSULE AS DIRECTEDEVERY DAY WITH HANDIHALER       Vitals:   08/02/19 1146 08/02/19 1538  BP: 123/89 129/82  Pulse: 88 91  Resp: 19 19  Temp: (!) 97.4 F (36.3 C) (!) 97.5 F (36.4 C)  SpO2: 98% 98%    Skin clean, dry and intact without evidence of skin break down, no evidence of skin tears noted. IV catheter discontinued intact. Site without signs and symptoms of complications. Dressing and pressure applied. Pt denies pain at this time. No complaints noted.  An After Visit Summary was printed and given to the patient. Patient escorted via WC, and D/C home via private auto.  Austin Blevins Austin Blevins

## 2019-08-02 NOTE — Discharge Summary (Signed)
Physician Discharge Summary   IFEANYICHUKWU WICKHAM  male DOB: Mar 14, 1961  GDJ:[id number]  PCP: [name], NP  Admit date: 07/20/2019 Discharge date: 08/02/2019  Admitted From: home Disposition:  Home.  PT/OT recommended SNF, but pt chose to go home with his sister.  Home Health: Yes CODE STATUS: DNR  Discharge Instructions    Diet - low sodium heart healthy   Complete by: As directed    Increase activity slowly   Complete by: As directed        Hospital Course:  For full details, please see H&P, progress notes, consult notes and ancillary notes.  Briefly,  Clifford Benninger Payneis a 59 y.o.malewith medical history significant forhydrocephalus with known VP shunt, severe COPD with chronic respiratory failure on home O2 at 3 L, anxiety and restless leg syndrome who presented to the emergency room with severe respiratory distress and complaint ofleftlateral chest pain and mid to lowerback pain. Symptoms have been ongoing for 4 days. He was admitted with a COPD exacerbation.  He's also had difficult to control back pain thought 2/2 his T7 compression fracture.  He's had delirium necessitating precedex gtt on 2 occasions and difficult to control restless leg requiring adjustment of requip and addition of gabapentin.    Acute on chronic respiratory failure with hypoxia (HCC)  COPD Exacerbation  COPD on 2 - 3 L at baseline (on 2 L at rest and 3 L with activity):  He'd been treated with steroids and pain meds by his pulmonologist prior to admission.  During current admission, pt received 3 days of cefepime followed by 4 days of ceftriaxone.  Also on scheduled DuoNeb.  Pt received a course of steroids with taper and was discharged on home prednisone 10 mg daily.  Prior to discharge, pt was on 3.5L O2 at rest and 4L for all mobility.  Acute Metabolic Encephalopathy  Delirium, resolved On 3/6 pt with worsening tremors, difficult to understand speech.  Negative head CT.  Thought to be  delirious at that time.  Overnight 3/10-11 he had increasing restlessness and restless leg symptoms, encephalopathic again.  Was restarted on precedex at this time, and then transitioned to Seroquel.  Prior to discharge, pt's mental status was back to baseline, so seroquel not continued.  restless legs        Continue ropinirole   Sinus Tachycardia: intermittent TSH wnl, free T4 slightly low.  Started on metoprolol and continued at discharge.  Visual hallucinations, resolved  Hypotension: Resolved.  Pulmonary nodules: CTA chest demonstrated unchanged bilateral pulmonary nodules with the largest in the right lower lobe.   Abdominal distention: Abdominal film raised a question of perigastric pneumoperitoniem. CT abdomen was performed which showed no obstruction, moderate stool load throughout the colonic confines, and no pneumoperitoneum.   Chest pain: Seems to be primarily with cough.  EKG without ischemic changes at presentation and negative troponins x 4.         - continue hycodan for cough       - voltaren, lidocaine patch    Acute T7 vertebral body fracture: Most certainly largely to blame for the patient's discomfort, but also possibly a consequence of the patient's cough due to acute bronchitis and his chronic on and off steroid use for severe COPD.  Discussed with neurosurgery.  Plan for TLSO brace and outpatient follow up with neurosurgery.  Short Rx of oxycodone 5 mg PRN, per pt request.  Chronic tremors. Improving. Neurology does not see anything new in CT head.  Follow outpatient with neurology.  Anxiety: Continue BuSpar  Hydrocephalus with history of VP shunt: No acute concerns at this time related to the hydrocephalus itself.  Severe protein calorie malnutrition: Nutrition consulted. Supplements ordered.  Elevated LFT's:  hepatitis panel (negative), previous abdominal imaging with grossly unremarkable liver  Prediabetes: ssi, levemir   Discharge  Diagnoses:  Principal Problem:   COPD with acute exacerbation (HCC) Active Problems:   Chronic respiratory failure with hypoxia (HCC)   Pulmonary nodules   Chest pain at rest   Thoracic vertebral fracture (HCC)   COPD exacerbation (HCC)   Visual hallucinations    Discharge Instructions:  Allergies as of 08/02/2019      Reactions   Acetaminophen Other (See Comments)   Syncope   Percocet [oxycodone-acetaminophen]       Medication List    TAKE these medications   albuterol (2.5 MG/3ML) 0.083% nebulizer solution Commonly known as: PROVENTIL USE ONE VIAL IN NEBULIZER EVERY SIX HOURS AS NEEDED FOR WHEEZING OR SHORTNESS OF BREATH   albuterol 108 (90 Base) MCG/ACT inhaler Commonly known as: VENTOLIN HFA INHALE 2 PUFFS INTO THE LUNGS EVERY 4 HOURS AS NEEDED FOR WHEEZING OR SHORTNESS OF BREATH   busPIRone 10 MG tablet Commonly known as: BUSPAR TAKE ONE TABLET TWICE DAILY   Fluticasone-Salmeterol 250-50 MCG/DOSE Aepb Commonly known as: Advair Diskus Inhale 1 puff into the lungs 2 (two) times daily.   HYDROcodone-homatropine 5-1.5 MG/5ML syrup Commonly known as: HYCODAN Take 5 mLs by mouth every 8 (eight) hours as needed.   metoprolol tartrate 25 MG tablet Commonly known as: LOPRESSOR Take 0.5 tablets (12.5 mg total) by mouth 2 (two) times daily.   montelukast 10 MG tablet Commonly known as: SINGULAIR TAKE ONE TABLET AT BEDTIME   omeprazole 20 MG capsule Commonly known as: PRILOSEC TAKE 1 CAPSULE BY MOUTH EVERY DAY   oxyCODONE 5 MG immediate release tablet Commonly known as: Oxy IR/ROXICODONE Take 1 tablet (5 mg total) by mouth every 8 (eight) hours as needed for up to 5 days for severe pain.   predniSONE 10 MG tablet Commonly known as: DELTASONE Take 10 mg by mouth daily with breakfast.   rOPINIRole 0.5 MG tablet Commonly known as: REQUIP TAKE ONE TABLET EVERY MORNING AND TAKE THREE TABLETS AT BEDTIME AS NEEDED FOR RESTLESS LEG SYNDROME   sertraline 100 MG  tablet Commonly known as: ZOLOFT Take 1.5 tablets (150 mg total) by mouth daily.   Spiriva HandiHaler 18 MCG inhalation capsule Generic drug: tiotropium INHALE CONTENTS OF 1 CAPSULE AS DIRECTEDEVERY DAY WITH HANDIHALER       Follow-up Information    Marjie Skiffannady, Jolene T, NP. Schedule an appointment as soon as possible for a visit in 1 week.   Specialty: Nurse Practitioner Why: Patient will need to make a follow up appointment. Contact information: [address] [place] [address] [phone]           Allergies  Allergen Reactions  . Acetaminophen Other (See Comments)    Syncope  . Percocet [Oxycodone-Acetaminophen]      The results of significant diagnostics from this hospitalization (including imaging, microbiology, ancillary and laboratory) are listed below for reference.   Consultations:   Procedures/Studies: DG Abd 1 View  Result Date: 07/21/2019 CLINICAL DATA:  59 year old male with abdominal distension. EXAM: ABDOMEN - 1 VIEW COMPARISON:  None. FINDINGS: There is no bowel dilatation or evidence of obstruction. Mild air distended stomach. Mild thickened appearance of the wall of the stomach may be related to underdistention  or represent gastritis. There is delineation of the gastric wall which may be artifactual. If there is clinical concern for pneumoperitoneum further evaluation with CT is recommended. No radiopaque calculi. There is osteopenia with degenerative changes of the spine. No acute osseous pathology. IMPRESSION: 1. No evidence of bowel obstruction. 2. Artifact versus less likely pneumoperitoneum. Clinical correlation is recommended. Electronically Signed   By: Elgie Collard M.D.   On: 07/21/2019 16:37   CT HEAD WO CONTRAST  Result Date: 07/22/2019 CLINICAL DATA:  59 year old with an indwelling ventriculoperitoneal shunt, presenting with speech difficulty. EXAM: CT HEAD WITHOUT CONTRAST TECHNIQUE: Contiguous axial images were obtained from the base of the  skull through the vertex without intravenous contrast. COMPARISON:  11/12/2011. FINDINGS: Patient motion blurred many of the images, and persistent motion was present on the repeat imaging. Therefore, the study is less than optimal. Brain: Ventriculoperitoneal shunt catheter INNER is from a RIGHT parieto-occipital approach and has its tip in the frontal horn of the LEFT LATERAL ventricle. Mild lateral and third ventriculomegaly, unchanged since the 2013 examination. Moderate changes of small vessel disease of the white matter diffusely, progressive since prior CT. Allowing for the motion degradation, no visible hemorrhage or hematoma, extra-axial fluid collections, or evidence of acute stroke. Vascular: Mild-to-moderate age advanced BILATERAL carotid siphon atherosclerosis. No visible hyperdense vessel. Skull: No skull fracture or other focal osseous abnormality involving the skull. Sinuses/Orbits: Visualized paranasal sinuses, bilateral mastoid air cells and bilateral middle ear cavities well-aerated. Visualized orbits and globes normal in appearance. Other: None. IMPRESSION: 1. Motion degraded examination demonstrates no acute intracranial abnormality. 2. Stable mild lateral and third ventriculomegaly with ventriculoperitoneal shunt catheter in place. 3. Moderate chronic microvascular ischemic changes of the white matter. Electronically Signed   By: Hulan Saas M.D.   On: 07/22/2019 13:28   CT Angio Chest PE W/Cm &/Or Wo Cm  Result Date: 07/20/2019 CLINICAL DATA:  Pleuritic chest pain. Also reports falls. EXAM: CT ANGIOGRAPHY CHEST WITH CONTRAST TECHNIQUE: Multidetector CT imaging of the chest was performed using the standard protocol during bolus administration of intravenous contrast. Multiplanar CT image reconstructions and MIPs were obtained to evaluate the vascular anatomy. CONTRAST:  75mL OMNIPAQUE IOHEXOL 350 MG/ML SOLN COMPARISON:  Radiograph earlier this day. Chest CT 3 weeks ago 06/29/2019  FINDINGS: Cardiovascular: There are no filling defects within the pulmonary arteries to suggest pulmonary embolus. Aortic atherosclerosis without aneurysm or dissection. Heart is normal in size. No pericardial effusion. Mediastinum/Nodes: No mediastinal or hilar adenopathy. No thyroid nodule. No esophageal wall thickening. Lungs/Pleura: Advanced emphysema. Chronic left lung scarring, left fibrothorax, and adjacent round atelectasis. This is unchanged from prior exam. Right lower lobe perifissural nodule measures 11 x 7 mm, previously 12 x 7 mm, series 7, image 46. 5 mm right upper lobe pulmonary nodule, series 7, image 31, also unchanged. Left apical pulmonary nodule measuring 5 mm, unchanged, series 7, image 26. Nodular right apical scarring is unchanged. No new nodule. Trachea and mainstem bronchi are patent. Lower lobe bronchial thickening with areas of mucous plugging in the left lower lobe, stable. No pulmonary edema. No pneumothorax or new pleural effusion. Upper Abdomen: No acute findings. Musculoskeletal: Mild T7 inferior endplate compression fracture is new from CT last month. No acute or subacute rib fractures. Review of the MIP images confirms the above findings. IMPRESSION: 1. No pulmonary embolus. 2. Advanced emphysema with chronic left lung scarring, left fibrothorax, and adjacent round atelectasis. 3. Unchanged bilateral pulmonary nodules, largest in the right lower lobe measuring 11  x 7 mm. Follow-up recommended per prior recommendations. 4. Mild T7 inferior endplate compression fracture is new from CT last month. No acute or subacute rib fractures. Aortic Atherosclerosis (ICD10-I70.0) and Emphysema (ICD10-J43.9). Electronically Signed   By: Keith Rake M.D.   On: 07/20/2019 18:56   CT ABDOMEN PELVIS W CONTRAST  Result Date: 07/21/2019 CLINICAL DATA:  59 year old male with abdominal distension. Concern for small bowel obstruction. EXAM: CT ABDOMEN AND PELVIS WITH CONTRAST TECHNIQUE:  Multidetector CT imaging of the abdomen and pelvis was performed using the standard protocol following bolus administration of intravenous contrast. CONTRAST:  7mL OMNIPAQUE IOHEXOL 300 MG/ML  SOLN COMPARISON:  CT of the chest abdomen pelvis dated 03/30/2014. Abdominal radiograph dated 07/21/2019. FINDINGS: Lower chest: There is emphysematous changes of the lungs. Partially visualized chronic loculated bilateral pleural effusions, left greater right. There is a focal area of atelectasis versus scarring in the adjacent left lung base. No intra-abdominal free air or free fluid. Hepatobiliary: The liver is grossly unremarkable. No intrahepatic biliary ductal dilatation. No calcified gallstone or pericholecystic fluid. Pancreas: Scattered small pancreatic calcification sequela of chronic pancreatitis. No active inflammatory changes. No dilatation of the main pancreatic duct. Spleen: Normal in size without focal abnormality. Adrenals/Urinary Tract: The adrenal glands are unremarkable. There is no hydronephrosis on either side. There is symmetric enhancement and excretion of contrast by both kidneys. The visualized ureters and urinary bladder appear unremarkable. Stomach/Bowel: There is moderate stool throughout the colon. There is no bowel obstruction or active inflammation. The appendix is not visualized with certainty. No inflammatory changes identified in the right lower quadrant. Vascular/Lymphatic: Mild aortoiliac atherosclerotic disease. The IVC is unremarkable. No portal venous gas. There is no adenopathy. Reproductive: The prostate and seminal vesicles are grossly unremarkable. No pelvic mass. Other: None Musculoskeletal: Osteopenia with degenerative changes of the spine. No acute osseous pathology. IMPRESSION: 1. No acute intra-abdominal or pelvic pathology. No bowel obstruction. 2. Chronic loculated bilateral pleural effusions, left greater right. 3. Aortic Atherosclerosis (ICD10-I70.0). Electronically Signed    By: Anner Crete M.D.   On: 07/21/2019 23:19   DG Chest Port 1 View  Result Date: 07/27/2019 CLINICAL DATA:  Hypoxia. EXAM: PORTABLE CHEST 1 VIEW COMPARISON:  July 25, 2019. FINDINGS: Stable cardiomediastinal silhouette. No pneumothorax is noted. Stable mild loculated left pleural effusion is noted with associated left basilar atelectasis or infiltrate. Stable right lung opacities are noted concerning for atelectasis or possibly infiltrate. Right internal jugular catheter is unchanged. Bony thorax is unremarkable. IMPRESSION: Stable bilateral lung opacities are noted as well as loculated left pleural effusion. Electronically Signed   By: Marijo Conception M.D.   On: 07/27/2019 12:54   DG Chest Port 1 View  Result Date: 07/25/2019 CLINICAL DATA:  Cough. EXAM: PORTABLE CHEST 1 VIEW COMPARISON:  July 21, 2019. FINDINGS: The heart size and mediastinal contours are within normal limits. No pneumothorax is noted. Stable mild probably loculated left pleural effusion is noted with associated atelectasis or infiltrate. Mildly increased right basilar atelectasis or infiltrate is noted. The visualized skeletal structures are unremarkable. IMPRESSION: Stable mild probably loculated left pleural effusion with associated atelectasis or infiltrate. Mildly increased right basilar atelectasis or infiltrate. Electronically Signed   By: Marijo Conception M.D.   On: 07/25/2019 09:54   DG Chest Port 1 View  Result Date: 07/21/2019 CLINICAL DATA:  59 year old male with shortness of breath. History of COPD. EXAM: PORTABLE CHEST 1 VIEW COMPARISON:  Chest radiograph dated 07/20/2019 and CT dated 07/20/2019 FINDINGS: Small left  pleural effusion and left lung base atelectasis similar to prior radiograph. Pneumonia is not excluded clinical correlation is recommended. Similar appearance of the left lateral mid lung field pleuroparenchymal scarring. There is background of emphysema. No pneumothorax. Stable cardiomediastinal  silhouette. No acute osseous pathology. IMPRESSION: 1. Small left pleural effusion and left lung base atelectasis versus infiltrate. No interval change since the prior radiograph. 2. Emphysema. Electronically Signed   By: Elgie Collard M.D.   On: 07/21/2019 19:36   DG Chest Port 1 View  Result Date: 07/20/2019 CLINICAL DATA:  Chest pain and shortness of breath. EXAM: PORTABLE CHEST 1 VIEW COMPARISON:  Chest CT 06/29/2019, radiograph 04/07/2018 FINDINGS: Emphysema and chronic hyperinflation. Chronic left pleuroparenchymal scarring and pleural effusion. Chronic blunting of the right costophrenic angle likely combination of pleural thickening and hyperinflation. Right lower lobe pulmonary nodule on CT not well visualized radiographically. Unchanged heart size and mediastinal contours. No evidence of acute airspace disease. No pneumothorax. Right neck catheter unchanged. Stable osseous structures. IMPRESSION: Chronic emphysema, hyperinflation, left pleuroparenchymal scarring and pleural effusion. No acute abnormality. Emphysema (ICD10-J43.9). Electronically Signed   By: Narda Rutherford M.D.   On: 07/20/2019 17:35   ECHOCARDIOGRAM COMPLETE  Result Date: 08/01/2019    ECHOCARDIOGRAM REPORT   Patient Name:   KYLIE GROS Date of Exam: 07/31/2019 Medical Rec #:  [id number]     Height:       68.0 in Accession #:    [id number]    Weight:       123.7 lb Date of Birth:  [birth year]     BSA:          1.666 m Patient Age:    58 years      BP:           96/62 mmHg Patient Gender: M             HR:           84 bpm. Exam Location:  [facility] Procedure: 2D Echo, Color Doppler and Cardiac Doppler Indications:    Tachycardia  History:        Patient has no prior history of Echocardiogram examinations.                 COPD.  Sonographer:    Humphrey Rolls RDCS (AE) Referring Phys: [phone] A CALDWELL POWELL JR Diagnosing      Adrian Blackwater MD Phys:  Sonographer Comments: Suboptimal apical window and suboptimal parasternal window.  IMPRESSIONS  1. Left ventricular ejection fraction, by estimation, is 60 to 65%. The left ventricle has normal function. The left ventricle has no regional wall motion abnormalities. Left ventricular diastolic parameters are consistent with Grade I diastolic dysfunction (impaired relaxation).  2. Right ventricular systolic function is normal. The right ventricular size is normal. There is mildly elevated pulmonary artery systolic pressure.  3. The mitral valve is normal in structure. No evidence of mitral valve regurgitation. No evidence of mitral stenosis.  4. The aortic valve is normal in structure. Aortic valve regurgitation is not visualized. No aortic stenosis is present.  5. The inferior vena cava is normal in size with greater than 50% respiratory variability, suggesting right atrial pressure of 3 mmHg. FINDINGS  Left Ventricle: Left ventricular ejection fraction, by estimation, is 60 to 65%. The left ventricle has normal function. The left ventricle has no regional wall motion abnormalities. The left ventricular internal cavity size was normal in size. There is  no left ventricular hypertrophy. Left ventricular diastolic parameters  are consistent with Grade I diastolic dysfunction (impaired relaxation). Right Ventricle: The right ventricular size is normal. No increase in right ventricular wall thickness. Right ventricular systolic function is normal. There is mildly elevated pulmonary artery systolic pressure. The tricuspid regurgitant velocity is 2.54  m/s, and with an assumed right atrial pressure of 10 mmHg, the estimated right ventricular systolic pressure is 35.8 mmHg. Left Atrium: Left atrial size was normal in size. Right Atrium: Right atrial size was normal in size. Pericardium: There is no evidence of pericardial effusion. Mitral Valve: The mitral valve is normal in structure. Normal mobility of the mitral valve leaflets. No evidence of mitral valve regurgitation. No evidence of mitral valve  stenosis. MV peak gradient, 3.6 mmHg. The mean mitral valve gradient is 1.0 mmHg. Tricuspid Valve: The tricuspid valve is normal in structure. Tricuspid valve regurgitation is not demonstrated. No evidence of tricuspid stenosis. Aortic Valve: The aortic valve is normal in structure. Aortic valve regurgitation is not visualized. No aortic stenosis is present. Aortic valve mean gradient measures 3.0 mmHg. Aortic valve peak gradient measures 6.6 mmHg. Aortic valve area, by VTI measures 2.60 cm. Pulmonic Valve: The pulmonic valve was normal in structure. Pulmonic valve regurgitation is not visualized. No evidence of pulmonic stenosis. Aorta: The aortic root is normal in size and structure. Venous: The inferior vena cava is normal in size with greater than 50% respiratory variability, suggesting right atrial pressure of 3 mmHg. IAS/Shunts: No atrial level shunt detected by color flow Doppler.  LEFT VENTRICLE PLAX 2D LVIDd:         3.73 cm  Diastology LVIDs:         2.09 cm  LV e' lateral:   9.90 cm/s LV PW:         1.01 cm  LV E/e' lateral: 9.1 LV IVS:        0.68 cm  LV e' medial:    9.90 cm/s LVOT diam:     1.90 cm  LV E/e' medial:  9.1 LV SV:         50 LV SV Index:   30 LVOT Area:     2.84 cm  RIGHT VENTRICLE RV Basal diam:  2.86 cm LEFT ATRIUM           Index       RIGHT ATRIUM           Index LA diam:      2.00 cm 1.20 cm/m  RA Area:     10.30 cm LA Vol (A2C): 7.1 ml  4.24 ml/m  RA Volume:   21.40 ml  12.84 ml/m LA Vol (A4C): 18.2 ml 10.92 ml/m  AORTIC VALVE                   PULMONIC VALVE AV Area (Vmax):    2.33 cm    PV Vmax:       0.93 m/s AV Area (Vmean):   2.64 cm    PV Vmean:      65.700 cm/s AV Area (VTI):     2.60 cm    PV VTI:        0.173 m AV Vmax:           128.00 cm/s PV Peak grad:  3.4 mmHg AV Vmean:          73.300 cm/s PV Mean grad:  2.0 mmHg AV VTI:            0.192 m AV Peak Grad:  6.6 mmHg AV Mean Grad:      3.0 mmHg LVOT Vmax:         105.00 cm/s LVOT Vmean:        68.300 cm/s  LVOT VTI:          0.176 m LVOT/AV VTI ratio: 0.92  AORTA Ao Root diam: 2.90 cm MITRAL VALVE               TRICUSPID VALVE MV Area (PHT): 3.53 cm    TR Peak grad:   25.8 mmHg MV Peak grad:  3.6 mmHg    TR Vmax:        254.00 cm/s MV Mean grad:  1.0 mmHg MV Vmax:       0.95 m/s    SHUNTS MV Vmean:      53.5 cm/s   Systemic VTI:  0.18 m MV Decel Time: 215 msec    Systemic Diam: 1.90 cm MV E velocity: 89.70 cm/s MV A velocity: 81.50 cm/s MV E/A ratio:  1.10 Adrian Blackwater MD Electronically signed by Adrian Blackwater MD Signature Date/Time: 08/01/2019/9:46:44 AM    Final       Labs: BNP (last 3 results) Recent Labs    07/20/19 1650  BNP 44.0   Basic Metabolic Panel: Recent Labs  Lab 07/28/19 0632 07/28/19 0632 07/29/19 0505 07/30/19 0541 07/31/19 0424 08/01/19 0519 08/02/19 0529  NA 138   < > 140 140 140 143 141  K 4.5   < > 3.8 4.5 3.9 4.6 4.1  CL 101   < > 101 99 98 97* 98  CO2 28   < > 32 35* 35* 35* 34*  GLUCOSE 196*   < > 113* 153* 157* 104* 117*  BUN 20   < > 23* 24* 27* 34* 35*  CREATININE 1.01   < > 1.03 0.94 1.03 1.00 1.05  CALCIUM 8.5*   < > 8.6* 8.9 8.9 9.1 8.8*  MG 2.1   < > 2.3 2.1 2.1 2.3 2.3  PHOS 4.5  --  3.6 2.8 3.0 3.8  --    < > = values in this interval not displayed.   Liver Function Tests: Recent Labs  Lab 07/29/19 0505 07/30/19 0541 07/31/19 0424 08/01/19 0519 08/02/19 0529  AST 34 47* 43* 47* 38  ALT 51* 92* 101* 123* 123*  ALKPHOS 74 72 79 85 85  BILITOT 0.4 0.5 0.6 0.6 0.5  PROT 6.1* 5.8* 5.8* 5.9* 5.7*  ALBUMIN 3.2* 2.9* 3.0* 3.0* 2.9*   No results for input(s): LIPASE, AMYLASE in the last 168 hours. No results for input(s): AMMONIA in the last 168 hours. CBC: Recent Labs  Lab 07/29/19 0505 07/30/19 0541 07/31/19 0424 08/01/19 0519 08/02/19 0529  WBC 11.6* 12.0* 14.3* 14.3* 12.5*  NEUTROABS 10.0* 10.5* 11.8* 11.4* 9.4*  HGB 12.5* 11.8* 11.7* 11.7* 11.5*  HCT 38.9* 36.7* 37.5* 37.8* 36.8*  MCV 96.0 96.3 98.2 99.5 97.4  PLT 222 206 213  204 186   Cardiac Enzymes: No results for input(s): CKTOTAL, CKMB, CKMBINDEX, TROPONINI in the last 168 hours. BNP: Invalid input(s): POCBNP CBG: Recent Labs  Lab 08/01/19 1232 08/01/19 1730 08/01/19 2117 08/02/19 0812 08/02/19 1148  GLUCAP 240* 241* 201* 100* 287*   D-Dimer No results for input(s): DDIMER in the last 72 hours. Hgb A1c No results for input(s): HGBA1C in the last 72 hours. Lipid Profile No results for input(s): CHOL, HDL, LDLCALC, TRIG, CHOLHDL, LDLDIRECT in the last 72 hours. Thyroid function studies No  results for input(s): TSH, T4TOTAL, T3FREE, THYROIDAB in the last 72 hours.  Invalid input(s): FREET3 Anemia work up No results for input(s): VITAMINB12, FOLATE, FERRITIN, TIBC, IRON, RETICCTPCT in the last 72 hours. Urinalysis    Component Value Date/Time   COLORURINE STRAW (A) 07/22/2019 0600   APPEARANCEUR CLEAR (A) 07/22/2019 0600   LABSPEC 1.028 07/22/2019 0600   PHURINE 5.0 07/22/2019 0600   GLUCOSEU NEGATIVE 07/22/2019 0600   HGBUR NEGATIVE 07/22/2019 0600   BILIRUBINUR NEGATIVE 07/22/2019 0600   KETONESUR NEGATIVE 07/22/2019 0600   PROTEINUR NEGATIVE 07/22/2019 0600   NITRITE NEGATIVE 07/22/2019 0600   LEUKOCYTESUR NEGATIVE 07/22/2019 0600   Sepsis Labs Invalid input(s): PROCALCITONIN,  WBC,  LACTICIDVEN Microbiology No results found for this or any previous visit (from the past 240 hour(s)).   Total time spend on discharging this patient, including the last patient exam, discussing the hospital stay, instructions for ongoing care as it relates to all pertinent caregivers, as well as preparing the medical discharge records, prescriptions, and/or referrals as applicable, is 40 minutes.    Darlin Priestly, MD  [facility] 08/02/2019, 4:52 PM  If 7PM-7AM, please contact night-coverage

## 2019-08-02 NOTE — TOC Progression Note (Signed)
Transition of Care [facility]) - Progression Note    Patient Details  Name: Austin Blevins MRN: [id number] Date of Birth: [birth year]  [facility] CM/SW Contact  Trenton Founds, RN Phone Number: 08/02/2019, 10:55 AM  Clinical Narrative:   RNCM contacted patient's sister [name] to complete assessment and discuss SNF options. Sister reported to this CM that patient lives with his mother and although they have good family support it is becoming increasingly difficult for patient to care for himself and almost everything wears him out. Sister reports they have outpatient palliative services and she has a hospital bed and bedside commode that he uses at home, he has a walker as well but does not use. She reports that she provides his transportation and his medications are delivered from [facility].  RNCM discussed SNF recommendations with sister and this is something she is interested in and family is agreeable to. She reports patient has not had to go into a facility before and they do not have a preference as to which one. Discussed that this CM would complete bed search and then would come back to her and present offers for them to make a choice. She verbalized understanding and appreciation for this.  RNCM started PASSR (now under review), completed FL2 and sent out bed requests.  RNCM will continue to follow.     Expected Discharge Plan: Skilled Nursing Facility Barriers to Discharge: Continued Medical Work up  Expected Discharge Plan and Services Expected Discharge Plan: Skilled Nursing Facility In-house Referral: Hospice / Palliative Care(Palliative) Discharge Planning Services: CM Consult Post Acute Care Choice: Skilled Nursing Facility Living arrangements for the past 2 months: Single Family Home                                       Social Determinants of Health (SDOH) Interventions    Readmission Risk Interventions No flowsheet data found.

## 2019-08-03 ENCOUNTER — Telehealth: Payer: Self-pay | Admitting: Nurse Practitioner

## 2019-08-03 ENCOUNTER — Telehealth: Payer: Self-pay

## 2019-08-03 NOTE — Telephone Encounter (Signed)
Left secure message with Thayer Ohm for verbal orders.

## 2019-08-03 NOTE — Telephone Encounter (Signed)
Transition Care Management Follow-up Telephone Call  Date of discharge and from where: [facility] on 08/02/19  How have you been since you were released from the hospital? Doing better but still feels tired and weak. Pt noticed his left foot was swelling today. Swelling is minimal and there is some redness around the area but no pain at the site or an open wound. Pt still has rib pain that is about an 8 on a scale of 1-10. Pt is taking the oxycodone for pain and it is helping. Pt's breathing is better when pain level is down. Declines SOB, fever or n/v/d.  Any questions or concerns? No   Items Reviewed:  Did the pt receive and understand the discharge instructions provided? Yes   Medications obtained and verified? No, pt declined at this time. Did verify new medication prescribed at the hospital (Metoprolol).  Any new allergies since your discharge? No   Dietary orders reviewed? Yes  Do you have support at home? Yes   Other (ie: DME, Home Health, etc) [facility] was ordered for PT, OT and an aide.   Functional Questionnaire: (I = Independent and D = Dependent)  Bathing/Dressing- I   Meal Prep- I  Eating- I  Maintaining continence- I  Transferring/Ambulation- d, uses a cane mostly and occasionally a walker.  Managing Meds- I   Follow up appointments reviewed:    PCP Hospital f/u appt confirmed? Yes  Scheduled to see Aura Dials on 08/07/19 @ 3:30 PM.  Specialist Hospital f/u appt confirmed? N/A   Are transportation arrangements needed? No   If their condition worsens, is the pt aware to call  their PCP or go to the ED? Yes  Was the patient provided with contact information for the PCP's office or ED? Yes  Was the pt encouraged to call back with questions or concerns? Yes

## 2019-08-03 NOTE — Telephone Encounter (Signed)
Chris with [facility] is calling in for verbal orders for  PT   frequency: 2 week 4 and 1 week 4    CB: [phone]

## 2019-08-03 NOTE — Telephone Encounter (Signed)
Noted  

## 2019-08-04 ENCOUNTER — Ambulatory Visit: Payer: Medicare Other | Admitting: Unknown Physician Specialty

## 2019-08-07 ENCOUNTER — Other Ambulatory Visit: Payer: Self-pay

## 2019-08-07 ENCOUNTER — Ambulatory Visit (INDEPENDENT_AMBULATORY_CARE_PROVIDER_SITE_OTHER): Payer: Medicare Other | Admitting: Nurse Practitioner

## 2019-08-07 ENCOUNTER — Encounter: Payer: Self-pay | Admitting: Nurse Practitioner

## 2019-08-07 ENCOUNTER — Other Ambulatory Visit: Payer: Self-pay | Admitting: Nurse Practitioner

## 2019-08-07 VITALS — BP 91/60 | HR 75 | Temp 97.5°F

## 2019-08-07 DIAGNOSIS — J449 Chronic obstructive pulmonary disease, unspecified: Secondary | ICD-10-CM | POA: Diagnosis not present

## 2019-08-07 DIAGNOSIS — F331 Major depressive disorder, recurrent, moderate: Secondary | ICD-10-CM | POA: Diagnosis not present

## 2019-08-07 DIAGNOSIS — R5383 Other fatigue: Secondary | ICD-10-CM

## 2019-08-07 DIAGNOSIS — J9611 Chronic respiratory failure with hypoxia: Secondary | ICD-10-CM | POA: Diagnosis not present

## 2019-08-07 DIAGNOSIS — R000 Tachycardia, unspecified: Secondary | ICD-10-CM | POA: Insufficient documentation

## 2019-08-07 DIAGNOSIS — E43 Unspecified severe protein-calorie malnutrition: Secondary | ICD-10-CM

## 2019-08-07 DIAGNOSIS — J441 Chronic obstructive pulmonary disease with (acute) exacerbation: Secondary | ICD-10-CM | POA: Diagnosis not present

## 2019-08-07 DIAGNOSIS — Z8781 Personal history of (healed) traumatic fracture: Secondary | ICD-10-CM

## 2019-08-07 DIAGNOSIS — R7303 Prediabetes: Secondary | ICD-10-CM

## 2019-08-07 DIAGNOSIS — F17219 Nicotine dependence, cigarettes, with unspecified nicotine-induced disorders: Secondary | ICD-10-CM

## 2019-08-07 MED ORDER — HYDROCODONE-HOMATROPINE 5-1.5 MG/5ML PO SYRP: 5.0000 mL | ORAL_SOLUTION | Freq: Three times a day (TID) | ORAL | 0 refills | Status: DC | PRN

## 2019-08-07 MED ORDER — OXYCODONE HCL 5 MG PO TABS: 5.0000 mg | ORAL_TABLET | Freq: Three times a day (TID) | ORAL | 0 refills | Status: DC | PRN

## 2019-08-07 MED ORDER — ALBUTEROL SULFATE HFA 108 (90 BASE) MCG/ACT IN AERS: 2.0000 | INHALATION_SPRAY | RESPIRATORY_TRACT | 5 refills | Status: DC | PRN

## 2019-08-07 NOTE — Assessment & Plan Note (Signed)
Recent A1C remains in 6.1% range. Continue diet focus and initiate medication as needed.

## 2019-08-07 NOTE — Assessment & Plan Note (Signed)
Acute noted on recent CT.  Reports pain improved at this time, main pain concern is lower anterior ribs from frequent coughing.  Continue to monitor, poor surgical candidate due to respiratory status.  Continue Hycodan as needed for cough and Oxycodone as needed for pain. Refills sent in and PMP reviewed.

## 2019-08-07 NOTE — Progress Notes (Addendum)
BP 91/60   Pulse 75   Temp (!) 97.5 F (36.4 C) (Oral)   SpO2 98%    Subjective:    Patient ID: Austin Blevins, male    DOB: November 20, 1960, 59 y.o.   MRN: [id number]  HPI: Austin Blevins is a 59 y.o. male  Chief Complaint  Patient presents with  . Hospitalization Follow-up   Transition of Care Hospital Follow up.   Admitted to hospital on 07/20/2019 for COPD exacerbation and discharged 08/02/2019.  No current discharge note present in chart, reviewed initial HPI.  On discharge WBC trending downwards, but continues to have mild elevation at 12.5 with left shift, neuts 9.4, H/H 11.5/36.8, MCV 97.4.  CMP noted glucose 117 (A1C 6.1% in hospital, same as previous check in 2019), CA+ 8.8, ALT 123.  Did have some LFT elevation on admission, but this was trending downwards at discharge.  TSH while in hospital 2.103 and T4 0.50.  He was noted on CT imaging to have mild T7 inferior endplate compression fracture, unknown if from recent fall at home or chronic cough with his severe COPD.  He was provided Oxycodone at discharge for his chronic pain from COPD and Hycodan for his chronic cough, he reports these have helped at length and decreased his anxiety.  PMP review and last Oxycodone refill 08/02/19 for #15 pills and Hycodan 07/17/2019.   "HPI: Austin Blevins is a 59 y.o. male with medical history significant for hydrocephalus with known VP shunt, severe COPD with chronic respiratory failure on home O2 at 3 L, anxiety and restless leg syndrome who presented to the emergency room with severe respiratory distress and complaint of left  lateral chest pain and mid to lower back pain. Symptoms have been ongoing for 4 days. Pain is worse with movement and coughing and is of moderate intensity described as sharp and nonradiating. He has no associated nausea vomiting or diaphoresis. Shortness of breath worsens the chest pain.  He denies a fall. He denies fever, chills, abdominal pain, nausea vomiting or change in bowel  movement.  He saw his pulmonologist a few days prior for follow-up and received a prescription for steroid taper pack and tramadol for the back pain. ED Course: On arrival to the emergency room, per ED provider he was initially very tachypneic at 30, using accessory muscles to breathe. O2 sat maintained at 90% on his home flow rate at 3 L. He was also tachycardic at 134 but afebrile. BP 172/86. On his blood work, BNP 44 troponin less than 2, white cell count 12,500 with otherwise unremarkable blood work. EKG showed no acute ST-T wave changes. He had a CTA of his chest which was negative for PE but showed advanced emphysema and stable pulmonary nodules also showed a new T7 fracture. Patient was given DuoNeb treatments in the emergency room as well as IV steroids with significant improvement in his breathing. Hospitalist consulted for admission"  Hospital/Facility: [facility] D/C Physician: Dr. Fran Lowes D/C Date: 08/02/2019  Records Requested: 08/07/2019 Records Received: 08/07/2019 Records Reviewed: 08/07/2019  Diagnoses on Discharge:  COPD exacerbation   Date of interactive Contact within 48 hours of discharge:  Contact was through: phone  Date of 7 day or 14 day face-to-face visit:    within 14 days  Outpatient Encounter Medications as of 08/07/2019  Medication Sig Note  . albuterol (PROVENTIL) (2.5 MG/3ML) 0.083% nebulizer solution USE ONE VIAL IN NEBULIZER EVERY SIX HOURS AS NEEDED FOR WHEEZING OR SHORTNESS OF BREATH   .  albuterol (VENTOLIN HFA) 108 (90 Base) MCG/ACT inhaler Inhale 2 puffs into the lungs every 4 (four) hours as needed for wheezing or shortness of breath.   . busPIRone (BUSPAR) 10 MG tablet TAKE ONE TABLET TWICE DAILY   . Fluticasone-Salmeterol (ADVAIR DISKUS) 250-50 MCG/DOSE AEPB Inhale 1 puff into the lungs 2 (two) times daily.   [name] Kitchen. HYDROcodone-homatropine (HYCODAN) 5-1.5 MG/5ML syrup Take 5 mLs by mouth every 8 (eight) hours as needed.   . metoprolol tartrate (LOPRESSOR) 25 MG tablet  Take 0.5 tablets (12.5 mg total) by mouth 2 (two) times daily.   . montelukast (SINGULAIR) 10 MG tablet TAKE ONE TABLET AT BEDTIME   . omeprazole (PRILOSEC) 20 MG capsule TAKE 1 CAPSULE BY MOUTH EVERY DAY   . oxyCODONE (OXY IR/ROXICODONE) 5 MG immediate release tablet Take 1 tablet (5 mg total) by mouth every 8 (eight) hours as needed for up to 5 days for severe pain.   . predniSONE (DELTASONE) 10 MG tablet Take 10 mg by mouth daily with breakfast.   . rOPINIRole (REQUIP) 0.5 MG tablet TAKE ONE TABLET EVERY MORNING AND TAKE THREE TABLETS AT BEDTIME AS NEEDED FOR RESTLESS LEG SYNDROME   . sertraline (ZOLOFT) 100 MG tablet Take 1.5 tablets (150 mg total) by mouth daily.   [name] Kitchen. SPIRIVA HANDIHALER 18 MCG inhalation capsule INHALE CONTENTS OF 1 CAPSULE AS DIRECTEDEVERY DAY WITH HANDIHALER   . [DISCONTINUED] HYDROcodone-homatropine (HYCODAN) 5-1.5 MG/5ML syrup Take 5 mLs by mouth every 8 (eight) hours as needed.   . [DISCONTINUED] oxyCODONE (OXY IR/ROXICODONE) 5 MG immediate release tablet Take 1 tablet (5 mg total) by mouth every 8 (eight) hours as needed for up to 5 days for severe pain.   . [DISCONTINUED] albuterol (PROVENTIL HFA;VENTOLIN HFA) 108 (90 Base) MCG/ACT inhaler Inhale 2 puffs into the lungs every 4 (four) hours as needed for wheezing or shortness of breath. 12/13/2018: Using 4x day  . [DISCONTINUED] albuterol (VENTOLIN HFA) 108 (90 Base) MCG/ACT inhaler INHALE 2 PUFFS INTO THE LUNGS EVERY 4 HOURS AS NEEDED FOR WHEEZING OR SHORTNESS OF BREATH   . [DISCONTINUED] albuterol (VENTOLIN HFA) 108 (90 Base) MCG/ACT inhaler INHALE 2 PUFFS INTO THE LUNGS EVERY 4 HOURS AS NEEDED FOR WHEEZING OR SHORTNESS OF BREATH 07/04/2019: Using at least QID  . [DISCONTINUED] rOPINIRole (REQUIP) 0.5 MG tablet TAKE ONE TABLET BY MOUTH EVERY MORNING AND 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    No facility-administered encounter medications on file as of 08/07/2019.    Diagnostic Tests  Reviewed/Disposition: Reviewed in chart under imaging and labs  Consults: none  Discharge Instructions: Follow-up with PCP and Pulmonary  Disease/illness Education: Reviewed with patient and his sister at bedside  Home Health/Community Services Discussions/Referrals: Palliative services continue  Establishment or re-establishment of referral orders for community resources: Palliative  Discussion with other health care providers: reviewed notes available  Assessment and Support of treatment regimen adherence: reviewed with patient and his sister  Appointments Coordinated with: reviewed with patient  Education for self-management, independent living, and ADLs: reviewed with patient and his sister  COPD Seen by pulmonary last 07/17/2019. Continues on Spiriva, Advair, PRN Albuterol, and Singulair. Is O2 dependent.  Pulmonary prescribed Hycodan for chronic cough and this was continued at hospital discharge, discussed with patient and will continue this for comfort and cough control.  Does continue to smoke about 1/2 PPD. COPD status: stable Satisfied with current treatment?: yes Oxygen use: yes Dyspnea frequency: baseline Cough frequency: baseline Rescue inhaler frequency:  2 times  a day  Limitation of activity: no Productive cough: none Last Spirometry: with pulmonary Pneumovax: Up to Date Influenza: Up to Date  PREDIABETES Recent A1C 6.1%. Hypoglycemic episodes: none Polydipsia/polyuria: no Visual disturbance: no Chest pain: no Paresthesias: no  DEPRESSION Continues on Sertraline 150 MG daily and Buspar 10 MG BID. His sister inquired into benzo.  Discussed with patient and sister risks of opioid and benzo use together, at this time they wish to continue current treatment, as would prefer to have opioid for pain control which is helping patient's anxiety. Mood status: stable Satisfied with current treatment?: yes Symptom severity: moderate  Duration of current treatment :  chronic Side effects: no Medication compliance: good compliance Psychotherapy/counseling: none Previous psychiatric medications: Duloxetine and Sertraline Depressed mood: occasional Anxious mood: no Anhedonia: no Significant weight loss or gain: no Insomnia: yes hard to fall asleep Fatigue: no Feelings of worthlessness or guilt: no Impaired concentration/indecisiveness: yes Suicidal ideations: no Hopelessness: no Crying spells: no Depression screen [facility] 2/9 07/03/2019 05/30/2019 04/28/2019 03/24/2019 03/13/2019  Decreased Interest 3 3 3 3  0  Down, Depressed, Hopeless 2 2 2 2 3   PHQ - 2 Score 5 5 5 5 3   Altered sleeping 3 3 3 3 2   Tired, decreased energy 3 3 3 3 3   Change in appetite 1 3 2 1 3   Feeling bad or failure about yourself  2 2 1 1 3   Trouble concentrating 2 2 1 3 3   Moving slowly or fidgety/restless 1 1 1 2 2   Suicidal thoughts 1 1 1 1  0  PHQ-9 Score 18 20 17 19 19   Difficult doing work/chores Extremely dIfficult Somewhat difficult Very difficult Somewhat difficult Very difficult  Some recent data might be hidden    Relevant past medical, surgical, family and social history reviewed and updated as indicated. Interim medical history since our last visit reviewed. Allergies and medications reviewed and updated.  Review of Systems  Constitutional: Negative for activity change, diaphoresis, fatigue and fever.  Respiratory: Positive for cough, shortness of breath and wheezing. Negative for chest tightness.   Cardiovascular: Negative for chest pain, palpitations and leg swelling.  Gastrointestinal: Negative.   Endocrine: Negative for polydipsia, polyphagia and polyuria.  Neurological: Negative.   Psychiatric/Behavioral: Negative.    Per HPI unless specifically indicated above     Objective:    BP 91/60   Pulse 75   Temp (!) 97.5 F (36.4 C) (Oral)   SpO2 98%   Wt Readings from Last 3 Encounters:  08/02/19 126 lb 12.8 oz (57.5 kg)  03/24/19 120 lb (54.4 kg)    03/13/19 120 lb 9.6 oz (54.7 kg)    Physical Exam Vitals and nursing note reviewed.  Constitutional:      General: He is awake. He is not in acute distress.    Appearance: He is well-developed, well-groomed and underweight. He is not ill-appearing.  HENT:     Head: Normocephalic and atraumatic.     Right Ear: Hearing normal. No drainage.     Left Ear: Hearing normal. No drainage.  Eyes:     General: Lids are normal.        Right eye: No discharge.        Left eye: No discharge.     Conjunctiva/sclera: Conjunctivae normal.     Pupils: Pupils are equal, round, and reactive to light.  Neck:     Thyroid: No thyromegaly.     Vascular: No carotid bruit.  Cardiovascular:     Rate  and Rhythm: Normal rate and regular rhythm.     Heart sounds: Normal heart sounds, S1 normal and S2 normal. No murmur. No gallop.   Pulmonary:     Effort: Pulmonary effort is normal. No accessory muscle usage or respiratory distress.     Breath sounds: Decreased breath sounds and wheezing present.     Comments: Diminished breath sounds throughout with occasional expiratory wheezes, no rhonchi.  O2 by [place] on at 2 L. Abdominal:     General: Bowel sounds are normal.     Palpations: Abdomen is soft. There is no hepatomegaly or splenomegaly.     Tenderness: There is no abdominal tenderness.  Musculoskeletal:        General: Normal range of motion.     Cervical back: Normal range of motion and neck supple.     Right lower leg: No edema.     Left lower leg: No edema.  Skin:    General: Skin is warm and dry.     Capillary Refill: Capillary refill takes less than 2 seconds.  Neurological:     Mental Status: He is alert and oriented to person, place, and time.     Deep Tendon Reflexes: Reflexes are normal and symmetric.     Reflex Scores:      Brachioradialis reflexes are 2+ on the right side and 2+ on the left side.      Patellar reflexes are 2+ on the right side and 2+ on the left side. Psychiatric:         Attention and Perception: Attention normal.        Mood and Affect: Mood normal.        Speech: Speech normal.        Behavior: Behavior normal. Behavior is cooperative.        Thought Content: Thought content normal.    Results for orders placed or performed during the hospital encounter of 07/20/19  SARS CORONAVIRUS 2 (TAT 6-24 HRS) Nasopharyngeal Nasopharyngeal Swab   Specimen: Nasopharyngeal Swab  Result Value Ref Range   SARS Coronavirus 2 NEGATIVE NEGATIVE  Culture, blood (x 2)   Specimen: BLOOD  Result Value Ref Range   Specimen Description BLOOD LEFT ANTECUBITAL    Special Requests      BOTTLES DRAWN AEROBIC AND ANAEROBIC Blood Culture adequate volume   Culture      NO GROWTH 5 DAYS Performed at [facility], [address] [place], [place] [address]    Report Status 07/26/2019 FINAL   Culture, blood (x 2)   Specimen: BLOOD  Result Value Ref Range   Specimen Description BLOOD BLOOD LEFT HAND    Special Requests      BOTTLES DRAWN AEROBIC AND ANAEROBIC Blood Culture adequate volume   Culture      NO GROWTH 5 DAYS Performed at [facility], [address] [place], [place] [address]    Report Status 07/26/2019 FINAL   Urine culture   Specimen: Urine, Random  Result Value Ref Range   Specimen Description      URINE, RANDOM Performed at [facility], [address], [place], [place] [address]    Special Requests      NONE Performed at [facility], [address], [place], [place] [address]    Culture      NO GROWTH Performed at [facility], [address], [place], [place] [address]    Report Status 07/23/2019 FINAL   MRSA PCR Screening  Specimen: Nasal Mucosa; Nasopharyngeal  Result Value Ref Range   MRSA by PCR NEGATIVE NEGATIVE  Blood gas, venous  Result Value Ref Range   pH, Ven 7.38 7.250 - 7.430   pCO2, Ven 51 44.0 - 60.0 mmHg   pO2, Ven 58.0 (H) 32.0 - 45.0 mmHg   Bicarbonate 30.2 (H) 20.0 - 28.0  mmol/L   Acid-Base Excess 3.9 (H) 0.0 - 2.0 mmol/L   O2 Saturation 89.1 %   Patient temperature 37.0    Collection site VENOUS    Sample type VENOUS   CBC with Differential  Result Value Ref Range   WBC 12.5 (H) 4.0 - 10.5 K/uL   RBC 4.79 4.22 - 5.81 MIL/uL   Hemoglobin 14.9 13.0 - 17.0 g/dL   HCT 16.1 09.6 - 04.5 %   MCV 97.5 80.0 - 100.0 fL   MCH 31.1 26.0 - 34.0 pg   MCHC 31.9 30.0 - 36.0 g/dL   RDW 40.9 81.1 - 91.4 %   Platelets 315 150 - 400 K/uL   nRBC 0.0 0.0 - 0.2 %   Neutrophils Relative % 95 %   Neutro Abs 11.9 (H) 1.7 - 7.7 K/uL   Lymphocytes Relative 2 %   Lymphs Abs 0.3 (L) 0.7 - 4.0 K/uL   Monocytes Relative 2 %   Monocytes Absolute 0.3 0.1 - 1.0 K/uL   Eosinophils Relative 0 %   Eosinophils Absolute 0.0 0.0 - 0.5 K/uL   Basophils Relative 0 %   Basophils Absolute 0.0 0.0 - 0.1 K/uL   Immature Granulocytes 1 %   Abs Immature Granulocytes 0.06 0.00 - 0.07 K/uL  Comprehensive metabolic panel  Result Value Ref Range   Sodium 139 135 - 145 mmol/L   Potassium 4.5 3.5 - 5.1 mmol/L   Chloride 103 98 - 111 mmol/L   CO2 26 22 - 32 mmol/L   Glucose, Bld 178 (H) 70 - 99 mg/dL   BUN 29 (H) 6 - 20 mg/dL   Creatinine, Ser 7.82 0.61 - 1.24 mg/dL   Calcium 9.2 8.9 - 95.6 mg/dL   Total Protein 7.5 6.5 - 8.1 g/dL   Albumin 3.9 3.5 - 5.0 g/dL   AST 19 15 - 41 U/L   ALT 14 0 - 44 U/L   Alkaline Phosphatase 94 38 - 126 U/L   Total Bilirubin 0.5 0.3 - 1.2 mg/dL   GFR calc non Af Amer >60 >60 mL/min   GFR calc Af Amer >60 >60 mL/min   Anion gap 10 5 - 15  Brain natriuretic peptide  Result Value Ref Range   B Natriuretic Peptide 44.0 0.0 - 100.0 pg/mL  Lipase, blood  Result Value Ref Range   Lipase 108 (H) 11 - 51 U/L  HIV Antibody (routine testing w rflx)  Result Value Ref Range   HIV Screen 4th Generation wRfx NON REACTIVE NON REACTIVE  CBC  Result Value Ref Range   WBC 9.8 4.0 - 10.5 K/uL   RBC 4.30 4.22 - 5.81 MIL/uL   Hemoglobin 13.4 13.0 - 17.0 g/dL   HCT  21.3 08.6 - 57.8 %   MCV 97.7 80.0 - 100.0 fL   MCH 31.2 26.0 - 34.0 pg   MCHC 31.9 30.0 - 36.0 g/dL   RDW 46.9 62.9 - 52.8 %   Platelets 219 150 - 400 K/uL   nRBC 0.0 0.0 - 0.2 %  Glucose, capillary  Result Value Ref Range   Glucose-Capillary 255 (H) 70 - 99 mg/dL  Lactic acid, plasma  Result Value Ref Range   Lactic Acid, Venous 2.6 (HH) 0.5 - 1.9 mmol/L  Lactic acid, plasma  Result Value Ref Range   Lactic Acid, Venous 2.0 (HH) 0.5 - 1.9 mmol/L  Hemoglobin A1c  Result Value Ref Range   Hgb A1c MFr Bld 6.1 (H) 4.8 - 5.6 %   Mean Plasma Glucose 128.37 mg/dL  CBC with Differential  Result Value Ref Range   WBC 11.8 (H) 4.0 - 10.5 K/uL   RBC 4.24 4.22 - 5.81 MIL/uL   Hemoglobin 13.0 13.0 - 17.0 g/dL   HCT 16.1 09.6 - 04.5 %   MCV 97.9 80.0 - 100.0 fL   MCH 30.7 26.0 - 34.0 pg   MCHC 31.3 30.0 - 36.0 g/dL   RDW 40.9 81.1 - 91.4 %   Platelets 240 150 - 400 K/uL   nRBC 0.0 0.0 - 0.2 %   Neutrophils Relative % 86 %   Neutro Abs 10.2 (H) 1.7 - 7.7 K/uL   Lymphocytes Relative 4 %   Lymphs Abs 0.5 (L) 0.7 - 4.0 K/uL   Monocytes Relative 9 %   Monocytes Absolute 1.1 (H) 0.1 - 1.0 K/uL   Eosinophils Relative 0 %   Eosinophils Absolute 0.0 0.0 - 0.5 K/uL   Basophils Relative 0 %   Basophils Absolute 0.0 0.0 - 0.1 K/uL   Immature Granulocytes 1 %   Abs Immature Granulocytes 0.08 (H) 0.00 - 0.07 K/uL  Comprehensive metabolic panel  Result Value Ref Range   Sodium 141 135 - 145 mmol/L   Potassium 4.1 3.5 - 5.1 mmol/L   Chloride 105 98 - 111 mmol/L   CO2 29 22 - 32 mmol/L   Glucose, Bld 122 (H) 70 - 99 mg/dL   BUN 33 (H) 6 - 20 mg/dL   Creatinine, Ser 7.82 0.61 - 1.24 mg/dL   Calcium 9.4 8.9 - 95.6 mg/dL   Total Protein 6.4 (L) 6.5 - 8.1 g/dL   Albumin 3.4 (L) 3.5 - 5.0 g/dL   AST 29 15 - 41 U/L   ALT 19 0 - 44 U/L   Alkaline Phosphatase 73 38 - 126 U/L   Total Bilirubin 0.6 0.3 - 1.2 mg/dL   GFR calc non Af Amer >60 >60 mL/min   GFR calc Af Amer >60 >60 mL/min   Anion  gap 7 5 - 15  Protime-INR  Result Value Ref Range   Prothrombin Time 12.3 11.4 - 15.2 seconds   INR 0.9 0.8 - 1.2  APTT  Result Value Ref Range   aPTT <24 (L) 24 - 36 seconds  Procalcitonin  Result Value Ref Range   Procalcitonin 0.11 ng/mL  Urinalysis, Complete w Microscopic  Result Value Ref Range   Color, Urine STRAW (A) YELLOW   APPearance CLEAR (A) CLEAR   Specific Gravity, Urine 1.028 1.005 - 1.030   pH 5.0 5.0 - 8.0   Glucose, UA NEGATIVE NEGATIVE mg/dL   Hgb urine dipstick NEGATIVE NEGATIVE   Bilirubin Urine NEGATIVE NEGATIVE   Ketones, ur NEGATIVE NEGATIVE mg/dL   Protein, ur NEGATIVE NEGATIVE mg/dL   Nitrite NEGATIVE NEGATIVE   Leukocytes,Ua NEGATIVE NEGATIVE   RBC / HPF 0-5 0 - 5 RBC/hpf   WBC, UA NONE SEEN 0 - 5 WBC/hpf   Bacteria, UA NONE SEEN NONE SEEN   Squamous Epithelial / LPF NONE SEEN 0 - 5   Mucus PRESENT   Glucose, capillary  Result Value Ref Range   Glucose-Capillary 122 (  H) 70 - 99 mg/dL   Comment 1 Notify RN   CBC  Result Value Ref Range   WBC 10.2 4.0 - 10.5 K/uL   RBC 3.89 (L) 4.22 - 5.81 MIL/uL   Hemoglobin 12.1 (L) 13.0 - 17.0 g/dL   HCT 65.7 (L) 84.6 - 96.2 %   MCV 100.0 80.0 - 100.0 fL   MCH 31.1 26.0 - 34.0 pg   MCHC 31.1 30.0 - 36.0 g/dL   RDW 95.2 84.1 - 32.4 %   Platelets 194 150 - 400 K/uL   nRBC 0.0 0.0 - 0.2 %  Comprehensive metabolic panel  Result Value Ref Range   Sodium 142 135 - 145 mmol/L   Potassium 3.9 3.5 - 5.1 mmol/L   Chloride 102 98 - 111 mmol/L   CO2 32 22 - 32 mmol/L   Glucose, Bld 89 70 - 99 mg/dL   BUN 26 (H) 6 - 20 mg/dL   Creatinine, Ser 4.01 0.61 - 1.24 mg/dL   Calcium 8.8 (L) 8.9 - 10.3 mg/dL   Total Protein 5.7 (L) 6.5 - 8.1 g/dL   Albumin 3.1 (L) 3.5 - 5.0 g/dL   AST 16 15 - 41 U/L   ALT 19 0 - 44 U/L   Alkaline Phosphatase 62 38 - 126 U/L   Total Bilirubin 0.7 0.3 - 1.2 mg/dL   GFR calc non Af Amer >60 >60 mL/min   GFR calc Af Amer >60 >60 mL/min   Anion gap 8 5 - 15  Glucose, capillary  Result  Value Ref Range   Glucose-Capillary 86 70 - 99 mg/dL  Glucose, capillary  Result Value Ref Range   Glucose-Capillary 66 (L) 70 - 99 mg/dL   Comment 1 Notify RN   Glucose, capillary  Result Value Ref Range   Glucose-Capillary 78 70 - 99 mg/dL  Glucose, capillary  Result Value Ref Range   Glucose-Capillary 118 (H) 70 - 99 mg/dL   Comment 1 Notify RN   Lactic acid, plasma  Result Value Ref Range   Lactic Acid, Venous 3.8 (HH) 0.5 - 1.9 mmol/L  Lactic acid, plasma  Result Value Ref Range   Lactic Acid, Venous 1.5 0.5 - 1.9 mmol/L  Glucose, capillary  Result Value Ref Range   Glucose-Capillary 134 (H) 70 - 99 mg/dL   Comment 1 Notify RN   Blood gas, arterial  Result Value Ref Range   FIO2 0.36    Delivery systems NASAL CANNULA    pH, Arterial 7.38 7.350 - 7.450   pCO2 arterial 60 (H) 32.0 - 48.0 mmHg   pO2, Arterial 74 (L) 83.0 - 108.0 mmHg   Bicarbonate 35.5 (H) 20.0 - 28.0 mmol/L   Acid-Base Excess 8.2 (H) 0.0 - 2.0 mmol/L   O2 Saturation 94.4 %   Patient temperature 37.0    Collection site RIGHT RADIAL    Sample type ARTERIAL DRAW    Allens test (pass/fail) PASS PASS  CBC  Result Value Ref Range   WBC 7.9 4.0 - 10.5 K/uL   RBC 4.16 (L) 4.22 - 5.81 MIL/uL   Hemoglobin 13.0 13.0 - 17.0 g/dL   HCT 02.7 25.3 - 66.4 %   MCV 98.6 80.0 - 100.0 fL   MCH 31.3 26.0 - 34.0 pg   MCHC 31.7 30.0 - 36.0 g/dL   RDW 40.3 47.4 - 25.9 %   Platelets 198 150 - 400 K/uL   nRBC 0.0 0.0 - 0.2 %  Comprehensive metabolic panel  Result Value Ref  Range   Sodium 139 135 - 145 mmol/L   Potassium 4.9 3.5 - 5.1 mmol/L   Chloride 101 98 - 111 mmol/L   CO2 32 22 - 32 mmol/L   Glucose, Bld 71 70 - 99 mg/dL   BUN 26 (H) 6 - 20 mg/dL   Creatinine, Ser 6.29 0.61 - 1.24 mg/dL   Calcium 8.9 8.9 - 52.8 mg/dL   Total Protein 6.4 (L) 6.5 - 8.1 g/dL   Albumin 3.5 3.5 - 5.0 g/dL   AST 19 15 - 41 U/L   ALT 21 0 - 44 U/L   Alkaline Phosphatase 69 38 - 126 U/L   Total Bilirubin 0.4 0.3 - 1.2 mg/dL    GFR calc non Af Amer >60 >60 mL/min   GFR calc Af Amer >60 >60 mL/min   Anion gap 6 5 - 15  Glucose, capillary  Result Value Ref Range   Glucose-Capillary 75 70 - 99 mg/dL  CBC  Result Value Ref Range   WBC 8.3 4.0 - 10.5 K/uL   RBC 3.87 (L) 4.22 - 5.81 MIL/uL   Hemoglobin 12.1 (L) 13.0 - 17.0 g/dL   HCT 41.3 (L) 24.4 - 01.0 %   MCV 96.4 80.0 - 100.0 fL   MCH 31.3 26.0 - 34.0 pg   MCHC 32.4 30.0 - 36.0 g/dL   RDW 27.2 53.6 - 64.4 %   Platelets 171 150 - 400 K/uL   nRBC 0.0 0.0 - 0.2 %  Basic metabolic panel  Result Value Ref Range   Sodium 139 135 - 145 mmol/L   Potassium 4.2 3.5 - 5.1 mmol/L   Chloride 97 (L) 98 - 111 mmol/L   CO2 33 (H) 22 - 32 mmol/L   Glucose, Bld 122 (H) 70 - 99 mg/dL   BUN 22 (H) 6 - 20 mg/dL   Creatinine, Ser 0.34 0.61 - 1.24 mg/dL   Calcium 8.4 (L) 8.9 - 10.3 mg/dL   GFR calc non Af Amer >60 >60 mL/min   GFR calc Af Amer >60 >60 mL/min   Anion gap 9 5 - 15  Glucose, capillary  Result Value Ref Range   Glucose-Capillary 80 70 - 99 mg/dL  Blood gas, arterial  Result Value Ref Range   FIO2 1.00    pH, Arterial 7.09 (LL) 7.350 - 7.450   pCO2 arterial 66 (HH) 32.0 - 48.0 mmHg   pO2, Arterial 264 (H) 83.0 - 108.0 mmHg   Bicarbonate 20.0 20.0 - 28.0 mmol/L   Acid-base deficit 10.7 (H) 0.0 - 2.0 mmol/L   O2 Saturation 99.8 %   Patient temperature 37.0    Collection site LEFT RADIAL    Sample type ARTERIAL DRAW    Allens test (pass/fail) PASS PASS  Urine Drug Screen, Qualitative ([facility] only)  Result Value Ref Range   Tricyclic, Ur Screen NONE DETECTED NONE DETECTED   Amphetamines, Ur Screen NONE DETECTED NONE DETECTED   MDMA (Ecstasy)Ur Screen NONE DETECTED NONE DETECTED   Cocaine Metabolite,Ur [place] NONE DETECTED NONE DETECTED   Opiate, Ur Screen POSITIVE (A) NONE DETECTED   Phencyclidine (PCP) Ur S NONE DETECTED NONE DETECTED   Cannabinoid 50 Ng, Ur [place] NONE DETECTED NONE DETECTED   Barbiturates, Ur Screen NONE DETECTED NONE DETECTED    Benzodiazepine, Ur Scrn POSITIVE (A) NONE DETECTED   Methadone Scn, Ur NONE DETECTED NONE DETECTED  Magnesium  Result Value Ref Range   Magnesium 2.6 (H) 1.7 - 2.4 mg/dL  Phosphorus  Result Value Ref Range  Phosphorus 3.3 2.5 - 4.6 mg/dL  Glucose, capillary  Result Value Ref Range   Glucose-Capillary 119 (H) 70 - 99 mg/dL  Blood gas, arterial  Result Value Ref Range   FIO2 32.00    Delivery systems NASAL CANNULA    pH, Arterial 7.39 7.350 - 7.450   pCO2 arterial 61 (H) 32.0 - 48.0 mmHg   pO2, Arterial 125 (H) 83.0 - 108.0 mmHg   Bicarbonate 36.9 (H) 20.0 - 28.0 mmol/L   Acid-Base Excess 9.6 (H) 0.0 - 2.0 mmol/L   O2 Saturation 98.8 %   Patient temperature 37.0    Collection site LEFT RADIAL    Sample type ARTERIAL DRAW    Allens test (pass/fail) PASS PASS  Triglycerides  Result Value Ref Range   Triglycerides 47 <150 mg/dL  Glucose, capillary  Result Value Ref Range   Glucose-Capillary 119 (H) 70 - 99 mg/dL  Glucose, capillary  Result Value Ref Range   Glucose-Capillary 92 70 - 99 mg/dL  Glucose, capillary  Result Value Ref Range   Glucose-Capillary 86 70 - 99 mg/dL  Glucose, capillary  Result Value Ref Range   Glucose-Capillary 85 70 - 99 mg/dL  CBC with Differential/Platelet  Result Value Ref Range   WBC 7.9 4.0 - 10.5 K/uL   RBC 3.86 (L) 4.22 - 5.81 MIL/uL   Hemoglobin 11.9 (L) 13.0 - 17.0 g/dL   HCT 16.1 (L) 09.6 - 04.5 %   MCV 98.4 80.0 - 100.0 fL   MCH 30.8 26.0 - 34.0 pg   MCHC 31.3 30.0 - 36.0 g/dL   RDW 40.9 81.1 - 91.4 %   Platelets 182 150 - 400 K/uL   nRBC 0.0 0.0 - 0.2 %   Neutrophils Relative % 77 %   Neutro Abs 6.1 1.7 - 7.7 K/uL   Lymphocytes Relative 12 %   Lymphs Abs 0.9 0.7 - 4.0 K/uL   Monocytes Relative 6 %   Monocytes Absolute 0.5 0.1 - 1.0 K/uL   Eosinophils Relative 3 %   Eosinophils Absolute 0.3 0.0 - 0.5 K/uL   Basophils Relative 1 %   Basophils Absolute 0.0 0.0 - 0.1 K/uL   Immature Granulocytes 1 %   Abs Immature Granulocytes  0.05 0.00 - 0.07 K/uL  Basic metabolic panel  Result Value Ref Range   Sodium 140 135 - 145 mmol/L   Potassium 3.9 3.5 - 5.1 mmol/L   Chloride 103 98 - 111 mmol/L   CO2 33 (H) 22 - 32 mmol/L   Glucose, Bld 77 70 - 99 mg/dL   BUN 21 (H) 6 - 20 mg/dL   Creatinine, Ser 7.82 0.61 - 1.24 mg/dL   Calcium 8.6 (L) 8.9 - 10.3 mg/dL   GFR calc non Af Amer >60 >60 mL/min   GFR calc Af Amer >60 >60 mL/min   Anion gap 4 (L) 5 - 15  Magnesium  Result Value Ref Range   Magnesium 2.3 1.7 - 2.4 mg/dL  Phosphorus  Result Value Ref Range   Phosphorus 4.0 2.5 - 4.6 mg/dL  Glucose, capillary  Result Value Ref Range   Glucose-Capillary 66 (L) 70 - 99 mg/dL  Glucose, capillary  Result Value Ref Range   Glucose-Capillary 84 70 - 99 mg/dL  Glucose, capillary  Result Value Ref Range   Glucose-Capillary 61 (L) 70 - 99 mg/dL  Glucose, capillary  Result Value Ref Range   Glucose-Capillary 88 70 - 99 mg/dL  Glucose, capillary  Result Value Ref Range   Glucose-Capillary  116 (H) 70 - 99 mg/dL  Glucose, capillary  Result Value Ref Range   Glucose-Capillary 89 70 - 99 mg/dL  Glucose, capillary  Result Value Ref Range   Glucose-Capillary 175 (H) 70 - 99 mg/dL  Glucose, capillary  Result Value Ref Range   Glucose-Capillary 162 (H) 70 - 99 mg/dL  Glucose, capillary  Result Value Ref Range   Glucose-Capillary 221 (H) 70 - 99 mg/dL  Glucose, capillary  Result Value Ref Range   Glucose-Capillary 183 (H) 70 - 99 mg/dL  Glucose, capillary  Result Value Ref Range   Glucose-Capillary 83 70 - 99 mg/dL  Glucose, capillary  Result Value Ref Range   Glucose-Capillary 74 70 - 99 mg/dL  Glucose, capillary  Result Value Ref Range   Glucose-Capillary 121 (H) 70 - 99 mg/dL  Glucose, capillary  Result Value Ref Range   Glucose-Capillary 145 (H) 70 - 99 mg/dL  CBC  Result Value Ref Range   WBC 8.2 4.0 - 10.5 K/uL   RBC 3.81 (L) 4.22 - 5.81 MIL/uL   Hemoglobin 11.7 (L) 13.0 - 17.0 g/dL   HCT 37.0 (L) 39.0  - 52.0 %   MCV 97.1 80.0 - 100.0 fL   MCH 30.7 26.0 - 34.0 pg   MCHC 31.6 30.0 - 36.0 g/dL   RDW 12.2 11.5 - 15.5 %   Platelets 182 150 - 400 K/uL   nRBC 0.0 0.0 - 0.2 %  Basic metabolic panel  Result Value Ref Range   Sodium 140 135 - 145 mmol/L   Potassium 3.9 3.5 - 5.1 mmol/L   Chloride 102 98 - 111 mmol/L   CO2 32 22 - 32 mmol/L   Glucose, Bld 116 (H) 70 - 99 mg/dL   BUN 23 (H) 6 - 20 mg/dL   Creatinine, Ser 1.01 0.61 - 1.24 mg/dL   Calcium 8.4 (L) 8.9 - 10.3 mg/dL   GFR calc non Af Amer >60 >60 mL/min   GFR calc Af Amer >60 >60 mL/min   Anion gap 6 5 - 15  Glucose, capillary  Result Value Ref Range   Glucose-Capillary 170 (H) 70 - 99 mg/dL  Glucose, capillary  Result Value Ref Range   Glucose-Capillary 219 (H) 70 - 99 mg/dL  Glucose, capillary  Result Value Ref Range   Glucose-Capillary 112 (H) 70 - 99 mg/dL  Glucose, capillary  Result Value Ref Range   Glucose-Capillary 86 70 - 99 mg/dL  Glucose, capillary  Result Value Ref Range   Glucose-Capillary 101 (H) 70 - 99 mg/dL  Glucose, capillary  Result Value Ref Range   Glucose-Capillary 174 (H) 70 - 99 mg/dL  CBC with Differential/Platelet  Result Value Ref Range   WBC 11.6 (H) 4.0 - 10.5 K/uL   RBC 4.26 4.22 - 5.81 MIL/uL   Hemoglobin 13.0 13.0 - 17.0 g/dL   HCT 41.4 39.0 - 52.0 %   MCV 97.2 80.0 - 100.0 fL   MCH 30.5 26.0 - 34.0 pg   MCHC 31.4 30.0 - 36.0 g/dL   RDW 12.3 11.5 - 15.5 %   Platelets 243 150 - 400 K/uL   nRBC 0.0 0.0 - 0.2 %   Neutrophils Relative % 82 %   Neutro Abs 9.5 (H) 1.7 - 7.7 K/uL   Lymphocytes Relative 10 %   Lymphs Abs 1.2 0.7 - 4.0 K/uL   Monocytes Relative 5 %   Monocytes Absolute 0.6 0.1 - 1.0 K/uL   Eosinophils Relative 2 %   Eosinophils Absolute  0.2 0.0 - 0.5 K/uL   Basophils Relative 0 %   Basophils Absolute 0.1 0.0 - 0.1 K/uL   Immature Granulocytes 1 %   Abs Immature Granulocytes 0.11 (H) 0.00 - 0.07 K/uL  Comprehensive metabolic panel  Result Value Ref Range    Sodium 142 135 - 145 mmol/L   Potassium 4.5 3.5 - 5.1 mmol/L   Chloride 104 98 - 111 mmol/L   CO2 32 22 - 32 mmol/L   Glucose, Bld 95 70 - 99 mg/dL   BUN 21 (H) 6 - 20 mg/dL   Creatinine, Ser 1.61 0.61 - 1.24 mg/dL   Calcium 9.0 8.9 - 09.6 mg/dL   Total Protein 6.5 6.5 - 8.1 g/dL   Albumin 3.4 (L) 3.5 - 5.0 g/dL   AST 24 15 - 41 U/L   ALT 37 0 - 44 U/L   Alkaline Phosphatase 76 38 - 126 U/L   Total Bilirubin 0.6 0.3 - 1.2 mg/dL   GFR calc non Af Amer >60 >60 mL/min   GFR calc Af Amer >60 >60 mL/min   Anion gap 6 5 - 15  Magnesium  Result Value Ref Range   Magnesium 1.9 1.7 - 2.4 mg/dL  Phosphorus  Result Value Ref Range   Phosphorus 4.6 2.5 - 4.6 mg/dL  Glucose, capillary  Result Value Ref Range   Glucose-Capillary 182 (H) 70 - 99 mg/dL  Glucose, capillary  Result Value Ref Range   Glucose-Capillary 192 (H) 70 - 99 mg/dL  Blood gas, arterial  Result Value Ref Range   FIO2 0.40    Delivery systems NASAL CANNULA    pH, Arterial 7.35 7.350 - 7.450   pCO2 arterial 66 (HH) 32.0 - 48.0 mmHg   pO2, Arterial 59 (L) 83.0 - 108.0 mmHg   Bicarbonate 36.4 (H) 20.0 - 28.0 mmol/L   Acid-Base Excess 8.3 (H) 0.0 - 2.0 mmol/L   O2 Saturation 88.8 %   Patient temperature 37.0    Collection site LEFT RADIAL    Sample type ARTERIAL DRAW    Allens test (pass/fail) PASS PASS  Glucose, capillary  Result Value Ref Range   Glucose-Capillary 68 (L) 70 - 99 mg/dL  Glucose, capillary  Result Value Ref Range   Glucose-Capillary 82 70 - 99 mg/dL  Glucose, capillary  Result Value Ref Range   Glucose-Capillary 87 70 - 99 mg/dL  Iron  Result Value Ref Range   Iron 66 45 - 182 ug/dL  TSH  Result Value Ref Range   TSH 8.435 (H) 0.350 - 4.500 uIU/mL  CBC with Differential/Platelet  Result Value Ref Range   WBC 10.3 4.0 - 10.5 K/uL   RBC 4.24 4.22 - 5.81 MIL/uL   Hemoglobin 12.8 (L) 13.0 - 17.0 g/dL   HCT 04.5 40.9 - 81.1 %   MCV 95.3 80.0 - 100.0 fL   MCH 30.2 26.0 - 34.0 pg   MCHC 31.7  30.0 - 36.0 g/dL   RDW 91.4 78.2 - 95.6 %   Platelets 208 150 - 400 K/uL   nRBC 0.0 0.0 - 0.2 %   Neutrophils Relative % 90 %   Neutro Abs 9.3 (H) 1.7 - 7.7 K/uL   Lymphocytes Relative 5 %   Lymphs Abs 0.5 (L) 0.7 - 4.0 K/uL   Monocytes Relative 4 %   Monocytes Absolute 0.4 0.1 - 1.0 K/uL   Eosinophils Relative 0 %   Eosinophils Absolute 0.0 0.0 - 0.5 K/uL   Basophils Relative 0 %  Basophils Absolute 0.0 0.0 - 0.1 K/uL   Immature Granulocytes 1 %   Abs Immature Granulocytes 0.07 0.00 - 0.07 K/uL  Comprehensive metabolic panel  Result Value Ref Range   Sodium 138 135 - 145 mmol/L   Potassium 4.5 3.5 - 5.1 mmol/L   Chloride 101 98 - 111 mmol/L   CO2 28 22 - 32 mmol/L   Glucose, Bld 196 (H) 70 - 99 mg/dL   BUN 20 6 - 20 mg/dL   Creatinine, Ser 1.61 0.61 - 1.24 mg/dL   Calcium 8.5 (L) 8.9 - 10.3 mg/dL   Total Protein 6.1 (L) 6.5 - 8.1 g/dL   Albumin 3.0 (L) 3.5 - 5.0 g/dL   AST 28 15 - 41 U/L   ALT 40 0 - 44 U/L   Alkaline Phosphatase 73 38 - 126 U/L   Total Bilirubin 0.6 0.3 - 1.2 mg/dL   GFR calc non Af Amer >60 >60 mL/min   GFR calc Af Amer >60 >60 mL/min   Anion gap 9 5 - 15  Magnesium  Result Value Ref Range   Magnesium 2.1 1.7 - 2.4 mg/dL  Phosphorus  Result Value Ref Range   Phosphorus 4.5 2.5 - 4.6 mg/dL  Glucose, capillary  Result Value Ref Range   Glucose-Capillary 135 (H) 70 - 99 mg/dL  Glucose, capillary  Result Value Ref Range   Glucose-Capillary 182 (H) 70 - 99 mg/dL  Procalcitonin - Baseline  Result Value Ref Range   Procalcitonin 0.11 ng/mL  Glucose, capillary  Result Value Ref Range   Glucose-Capillary 109 (H) 70 - 99 mg/dL   Comment 1 QC Due   Glucose, capillary  Result Value Ref Range   Glucose-Capillary 271 (H) 70 - 99 mg/dL  Glucose, capillary  Result Value Ref Range   Glucose-Capillary 240 (H) 70 - 99 mg/dL  Glucose, capillary  Result Value Ref Range   Glucose-Capillary 181 (H) 70 - 99 mg/dL  Glucose, capillary  Result Value Ref Range    Glucose-Capillary 205 (H) 70 - 99 mg/dL  CBC with Differential/Platelet  Result Value Ref Range   WBC 11.6 (H) 4.0 - 10.5 K/uL   RBC 4.05 (L) 4.22 - 5.81 MIL/uL   Hemoglobin 12.5 (L) 13.0 - 17.0 g/dL   HCT 09.6 (L) 04.5 - 40.9 %   MCV 96.0 80.0 - 100.0 fL   MCH 30.9 26.0 - 34.0 pg   MCHC 32.1 30.0 - 36.0 g/dL   RDW 81.1 91.4 - 78.2 %   Platelets 222 150 - 400 K/uL   nRBC 0.0 0.0 - 0.2 %   Neutrophils Relative % 85 %   Neutro Abs 10.0 (H) 1.7 - 7.7 K/uL   Lymphocytes Relative 7 %   Lymphs Abs 0.8 0.7 - 4.0 K/uL   Monocytes Relative 6 %   Monocytes Absolute 0.7 0.1 - 1.0 K/uL   Eosinophils Relative 1 %   Eosinophils Absolute 0.1 0.0 - 0.5 K/uL   Basophils Relative 0 %   Basophils Absolute 0.0 0.0 - 0.1 K/uL   Immature Granulocytes 1 %   Abs Immature Granulocytes 0.09 (H) 0.00 - 0.07 K/uL  Comprehensive metabolic panel  Result Value Ref Range   Sodium 140 135 - 145 mmol/L   Potassium 3.8 3.5 - 5.1 mmol/L   Chloride 101 98 - 111 mmol/L   CO2 32 22 - 32 mmol/L   Glucose, Bld 113 (H) 70 - 99 mg/dL   BUN 23 (H) 6 - 20 mg/dL  Creatinine, Ser 1.03 0.61 - 1.24 mg/dL   Calcium 8.6 (L) 8.9 - 10.3 mg/dL   Total Protein 6.1 (L) 6.5 - 8.1 g/dL   Albumin 3.2 (L) 3.5 - 5.0 g/dL   AST 34 15 - 41 U/L   ALT 51 (H) 0 - 44 U/L   Alkaline Phosphatase 74 38 - 126 U/L   Total Bilirubin 0.4 0.3 - 1.2 mg/dL   GFR calc non Af Amer >60 >60 mL/min   GFR calc Af Amer >60 >60 mL/min   Anion gap 7 5 - 15  Magnesium  Result Value Ref Range   Magnesium 2.3 1.7 - 2.4 mg/dL  Phosphorus  Result Value Ref Range   Phosphorus 3.6 2.5 - 4.6 mg/dL  Glucose, capillary  Result Value Ref Range   Glucose-Capillary 287 (H) 70 - 99 mg/dL  Glucose, capillary  Result Value Ref Range   Glucose-Capillary 142 (H) 70 - 99 mg/dL  Glucose, capillary  Result Value Ref Range   Glucose-Capillary 136 (H) 70 - 99 mg/dL  TSH  Result Value Ref Range   TSH 2.103 0.350 - 4.500 uIU/mL  T4, free  Result Value Ref  Range   Free T4 0.50 (L) 0.61 - 1.12 ng/dL  Glucose, capillary  Result Value Ref Range   Glucose-Capillary 292 (H) 70 - 99 mg/dL  Ferritin  Result Value Ref Range   Ferritin 160 24 - 336 ng/mL  Glucose, capillary  Result Value Ref Range   Glucose-Capillary 296 (H) 70 - 99 mg/dL  Glucose, capillary  Result Value Ref Range   Glucose-Capillary 241 (H) 70 - 99 mg/dL  CBC with Differential/Platelet  Result Value Ref Range   WBC 12.0 (H) 4.0 - 10.5 K/uL   RBC 3.81 (L) 4.22 - 5.81 MIL/uL   Hemoglobin 11.8 (L) 13.0 - 17.0 g/dL   HCT 16.1 (L) 09.6 - 04.5 %   MCV 96.3 80.0 - 100.0 fL   MCH 31.0 26.0 - 34.0 pg   MCHC 32.2 30.0 - 36.0 g/dL   RDW 40.9 81.1 - 91.4 %   Platelets 206 150 - 400 K/uL   nRBC 0.0 0.0 - 0.2 %   Neutrophils Relative % 88 %   Neutro Abs 10.5 (H) 1.7 - 7.7 K/uL   Lymphocytes Relative 7 %   Lymphs Abs 0.8 0.7 - 4.0 K/uL   Monocytes Relative 4 %   Monocytes Absolute 0.5 0.1 - 1.0 K/uL   Eosinophils Relative 0 %   Eosinophils Absolute 0.0 0.0 - 0.5 K/uL   Basophils Relative 0 %   Basophils Absolute 0.0 0.0 - 0.1 K/uL   Immature Granulocytes 1 %   Abs Immature Granulocytes 0.13 (H) 0.00 - 0.07 K/uL  Comprehensive metabolic panel  Result Value Ref Range   Sodium 140 135 - 145 mmol/L   Potassium 4.5 3.5 - 5.1 mmol/L   Chloride 99 98 - 111 mmol/L   CO2 35 (H) 22 - 32 mmol/L   Glucose, Bld 153 (H) 70 - 99 mg/dL   BUN 24 (H) 6 - 20 mg/dL   Creatinine, Ser 7.82 0.61 - 1.24 mg/dL   Calcium 8.9 8.9 - 95.6 mg/dL   Total Protein 5.8 (L) 6.5 - 8.1 g/dL   Albumin 2.9 (L) 3.5 - 5.0 g/dL   AST 47 (H) 15 - 41 U/L   ALT 92 (H) 0 - 44 U/L   Alkaline Phosphatase 72 38 - 126 U/L   Total Bilirubin 0.5 0.3 - 1.2 mg/dL  GFR calc non Af Amer >60 >60 mL/min   GFR calc Af Amer >60 >60 mL/min   Anion gap 6 5 - 15  Magnesium  Result Value Ref Range   Magnesium 2.1 1.7 - 2.4 mg/dL  Phosphorus  Result Value Ref Range   Phosphorus 2.8 2.5 - 4.6 mg/dL  Glucose, capillary    Result Value Ref Range   Glucose-Capillary 244 (H) 70 - 99 mg/dL  Glucose, capillary  Result Value Ref Range   Glucose-Capillary 133 (H) 70 - 99 mg/dL  Glucose, capillary  Result Value Ref Range   Glucose-Capillary 144 (H) 70 - 99 mg/dL  Hepatitis panel, acute  Result Value Ref Range   Hepatitis B Surface Ag NON REACTIVE NON REACTIVE   HCV Ab NON REACTIVE NON REACTIVE   Hep A IgM NON REACTIVE NON REACTIVE   Hep B C IgM NON REACTIVE NON REACTIVE  Glucose, capillary  Result Value Ref Range   Glucose-Capillary 225 (H) 70 - 99 mg/dL   Comment 1 Notify RN    Comment 2 Document in Chart   Glucose, capillary  Result Value Ref Range   Glucose-Capillary 299 (H) 70 - 99 mg/dL   Comment 1 Notify RN    Comment 2 Document in Chart   CBC with Differential/Platelet  Result Value Ref Range   WBC 14.3 (H) 4.0 - 10.5 K/uL   RBC 3.82 (L) 4.22 - 5.81 MIL/uL   Hemoglobin 11.7 (L) 13.0 - 17.0 g/dL   HCT 69.6 (L) 29.5 - 28.4 %   MCV 98.2 80.0 - 100.0 fL   MCH 30.6 26.0 - 34.0 pg   MCHC 31.2 30.0 - 36.0 g/dL   RDW 13.2 44.0 - 10.2 %   Platelets 213 150 - 400 K/uL   nRBC 0.0 0.0 - 0.2 %   Neutrophils Relative % 82 %   Neutro Abs 11.8 (H) 1.7 - 7.7 K/uL   Lymphocytes Relative 9 %   Lymphs Abs 1.2 0.7 - 4.0 K/uL   Monocytes Relative 5 %   Monocytes Absolute 0.7 0.1 - 1.0 K/uL   Eosinophils Relative 1 %   Eosinophils Absolute 0.1 0.0 - 0.5 K/uL   Basophils Relative 1 %   Basophils Absolute 0.1 0.0 - 0.1 K/uL   Immature Granulocytes 2 %   Abs Immature Granulocytes 0.31 (H) 0.00 - 0.07 K/uL  Comprehensive metabolic panel  Result Value Ref Range   Sodium 140 135 - 145 mmol/L   Potassium 3.9 3.5 - 5.1 mmol/L   Chloride 98 98 - 111 mmol/L   CO2 35 (H) 22 - 32 mmol/L   Glucose, Bld 157 (H) 70 - 99 mg/dL   BUN 27 (H) 6 - 20 mg/dL   Creatinine, Ser 7.25 0.61 - 1.24 mg/dL   Calcium 8.9 8.9 - 36.6 mg/dL   Total Protein 5.8 (L) 6.5 - 8.1 g/dL   Albumin 3.0 (L) 3.5 - 5.0 g/dL   AST 43 (H) 15 -  41 U/L   ALT 101 (H) 0 - 44 U/L   Alkaline Phosphatase 79 38 - 126 U/L   Total Bilirubin 0.6 0.3 - 1.2 mg/dL   GFR calc non Af Amer >60 >60 mL/min   GFR calc Af Amer >60 >60 mL/min   Anion gap 7 5 - 15  Magnesium  Result Value Ref Range   Magnesium 2.1 1.7 - 2.4 mg/dL  Phosphorus  Result Value Ref Range   Phosphorus 3.0 2.5 - 4.6 mg/dL  Glucose, capillary  Result Value Ref Range   Glucose-Capillary 211 (H) 70 - 99 mg/dL  Glucose, capillary  Result Value Ref Range   Glucose-Capillary 139 (H) 70 - 99 mg/dL  Glucose, capillary  Result Value Ref Range   Glucose-Capillary 150 (H) 70 - 99 mg/dL  Glucose, capillary  Result Value Ref Range   Glucose-Capillary 116 (H) 70 - 99 mg/dL  Glucose, capillary  Result Value Ref Range   Glucose-Capillary 242 (H) 70 - 99 mg/dL  Glucose, capillary  Result Value Ref Range   Glucose-Capillary 267 (H) 70 - 99 mg/dL  CBC with Differential/Platelet  Result Value Ref Range   WBC 14.3 (H) 4.0 - 10.5 K/uL   RBC 3.80 (L) 4.22 - 5.81 MIL/uL   Hemoglobin 11.7 (L) 13.0 - 17.0 g/dL   HCT 02.5 (L) 85.2 - 77.8 %   MCV 99.5 80.0 - 100.0 fL   MCH 30.8 26.0 - 34.0 pg   MCHC 31.0 30.0 - 36.0 g/dL   RDW 24.2 35.3 - 61.4 %   Platelets 204 150 - 400 K/uL   nRBC 0.0 0.0 - 0.2 %   Neutrophils Relative % 80 %   Neutro Abs 11.4 (H) 1.7 - 7.7 K/uL   Lymphocytes Relative 11 %   Lymphs Abs 1.6 0.7 - 4.0 K/uL   Monocytes Relative 4 %   Monocytes Absolute 0.6 0.1 - 1.0 K/uL   Eosinophils Relative 1 %   Eosinophils Absolute 0.1 0.0 - 0.5 K/uL   Basophils Relative 1 %   Basophils Absolute 0.1 0.0 - 0.1 K/uL   Immature Granulocytes 3 %   Abs Immature Granulocytes 0.47 (H) 0.00 - 0.07 K/uL  Comprehensive metabolic panel  Result Value Ref Range   Sodium 143 135 - 145 mmol/L   Potassium 4.6 3.5 - 5.1 mmol/L   Chloride 97 (L) 98 - 111 mmol/L   CO2 35 (H) 22 - 32 mmol/L   Glucose, Bld 104 (H) 70 - 99 mg/dL   BUN 34 (H) 6 - 20 mg/dL   Creatinine, Ser 4.31 0.61 -  1.24 mg/dL   Calcium 9.1 8.9 - 54.0 mg/dL   Total Protein 5.9 (L) 6.5 - 8.1 g/dL   Albumin 3.0 (L) 3.5 - 5.0 g/dL   AST 47 (H) 15 - 41 U/L   ALT 123 (H) 0 - 44 U/L   Alkaline Phosphatase 85 38 - 126 U/L   Total Bilirubin 0.6 0.3 - 1.2 mg/dL   GFR calc non Af Amer >60 >60 mL/min   GFR calc Af Amer >60 >60 mL/min   Anion gap 11 5 - 15  Magnesium  Result Value Ref Range   Magnesium 2.3 1.7 - 2.4 mg/dL  Phosphorus  Result Value Ref Range   Phosphorus 3.8 2.5 - 4.6 mg/dL  Glucose, capillary  Result Value Ref Range   Glucose-Capillary 279 (H) 70 - 99 mg/dL   Comment 1 Notify RN   Glucose, capillary  Result Value Ref Range   Glucose-Capillary 154 (H) 70 - 99 mg/dL  Glucose, capillary  Result Value Ref Range   Glucose-Capillary 106 (H) 70 - 99 mg/dL  Glucose, capillary  Result Value Ref Range   Glucose-Capillary 89 70 - 99 mg/dL   Comment 1 Notify RN    Comment 2 Document in Chart   Glucose, capillary  Result Value Ref Range   Glucose-Capillary 240 (H) 70 - 99 mg/dL  Glucose, capillary  Result Value Ref Range   Glucose-Capillary 241 (H) 70 - 99 mg/dL  Glucose, capillary  Result Value Ref Range   Glucose-Capillary 201 (H) 70 - 99 mg/dL   Comment 1 Notify RN   CBC with Differential/Platelet  Result Value Ref Range   WBC 12.5 (H) 4.0 - 10.5 K/uL   RBC 3.78 (L) 4.22 - 5.81 MIL/uL   Hemoglobin 11.5 (L) 13.0 - 17.0 g/dL   HCT 16.1 (L) 09.6 - 04.5 %   MCV 97.4 80.0 - 100.0 fL   MCH 30.4 26.0 - 34.0 pg   MCHC 31.3 30.0 - 36.0 g/dL   RDW 40.9 81.1 - 91.4 %   Platelets 186 150 - 400 K/uL   nRBC 0.0 0.0 - 0.2 %   Neutrophils Relative % 75 %   Neutro Abs 9.4 (H) 1.7 - 7.7 K/uL   Lymphocytes Relative 14 %   Lymphs Abs 1.7 0.7 - 4.0 K/uL   Monocytes Relative 4 %   Monocytes Absolute 0.5 0.1 - 1.0 K/uL   Eosinophils Relative 2 %   Eosinophils Absolute 0.2 0.0 - 0.5 K/uL   Basophils Relative 1 %   Basophils Absolute 0.1 0.0 - 0.1 K/uL   Immature Granulocytes 4 %   Abs  Immature Granulocytes 0.50 (H) 0.00 - 0.07 K/uL  Comprehensive metabolic panel  Result Value Ref Range   Sodium 141 135 - 145 mmol/L   Potassium 4.1 3.5 - 5.1 mmol/L   Chloride 98 98 - 111 mmol/L   CO2 34 (H) 22 - 32 mmol/L   Glucose, Bld 117 (H) 70 - 99 mg/dL   BUN 35 (H) 6 - 20 mg/dL   Creatinine, Ser 7.82 0.61 - 1.24 mg/dL   Calcium 8.8 (L) 8.9 - 10.3 mg/dL   Total Protein 5.7 (L) 6.5 - 8.1 g/dL   Albumin 2.9 (L) 3.5 - 5.0 g/dL   AST 38 15 - 41 U/L   ALT 123 (H) 0 - 44 U/L   Alkaline Phosphatase 85 38 - 126 U/L   Total Bilirubin 0.5 0.3 - 1.2 mg/dL   GFR calc non Af Amer >60 >60 mL/min   GFR calc Af Amer >60 >60 mL/min   Anion gap 9 5 - 15  Magnesium  Result Value Ref Range   Magnesium 2.3 1.7 - 2.4 mg/dL  Glucose, capillary  Result Value Ref Range   Glucose-Capillary 100 (H) 70 - 99 mg/dL  Glucose, capillary  Result Value Ref Range   Glucose-Capillary 287 (H) 70 - 99 mg/dL  ECHOCARDIOGRAM COMPLETE  Result Value Ref Range   Weight 1,979.2 oz   Height 68 in   BP 96/62 mmHg  Troponin I (High Sensitivity)  Result Value Ref Range   Troponin I (High Sensitivity) <2 <18 ng/L  Troponin I (High Sensitivity)  Result Value Ref Range   Troponin I (High Sensitivity) 3 <18 ng/L  Troponin I (High Sensitivity)  Result Value Ref Range   Troponin I (High Sensitivity) 6 <18 ng/L  Troponin I (High Sensitivity)  Result Value Ref Range   Troponin I (High Sensitivity) 7 <18 ng/L      Assessment & Plan:   Problem List Items Addressed This Visit      Respiratory   Chronic respiratory failure with hypoxia (HCC)    Chronic, ongoing with O2 use daily.  Continue current medication regimen and adjust as needed + collaboration with pulmonary and palliative team.  Return to office in 6 weeks.      Stage 3 severe COPD by GOLD classification (HCC)    Chronic, ongoing, severe  with O2 dependence. Continue current medication and collaboration with pulmonary, palliative, and CCM team.  Adjust  regimen as needed.  Refill on Hycodan sent.      Relevant Medications   HYDROcodone-homatropine (HYCODAN) 5-1.5 MG/5ML syrup   COPD with acute exacerbation (HCC) - Primary    Acute with improvement at this time.  Continue Prednisone 10 MG daily + current inhaler regimen and use of O2.  Continue collaboration with pulmonary.  Hycodan refill sent in.  Return to office in 6 weeks.      Relevant Medications   HYDROcodone-homatropine (HYCODAN) 5-1.5 MG/5ML syrup   Other Relevant Orders   CBC with Differential/Platelet   Comprehensive metabolic panel     Nervous and Auditory   Nicotine dependence, cigarettes, w unsp disorders    I have recommended complete cessation of tobacco use. I have discussed various options available for assistance with tobacco cessation including over the counter methods (Nicotine gum, patch and lozenges). We also discussed prescription options (Chantix, Nicotine Inhaler / Nasal Spray). The patient is not interested in pursuing any prescription tobacco cessation options at this time.         Other   Protein-calorie malnutrition, severe    Ongoing with severe COPD.  Recommend he continue protein shakes 3 times per day.  Educated on stages of COPD.      Depression, major, recurrent, moderate (HCC)    Chronic, ongoing, continues to be exacerbated by his severe COPD and recent grieving. Continue Sertraline 150 MG daily and continue Buspar 10 MG BID, he reports these work well.  He denies SI/HI.   Return in 6 weeks.  Would avoid benzo at this time, as concern for benzo and opioid combination use in this patient with severe COPD.      Prediabetes    Recent A1C remains in 6.1% range. Continue diet focus and initiate medication as needed.        History of spinal fracture    Acute noted on recent CT.  Reports pain improved at this time, main pain concern is lower anterior ribs from frequent coughing.  Continue to monitor, poor surgical candidate due to respiratory  status.  Continue Hycodan as needed for cough and Oxycodone as needed for pain. Refills sent in and PMP reviewed.      Sinus tachycardia    New diagnosis.  Started on Metoprolol 12.5 MG BID in hospital.  HR 70's today.  Continue this regimen and adjust as needed.         Other Visit Diagnoses    Fatigue, unspecified type       Check TSH and T4 today, low T4 in hospital   Relevant Orders   Thyroid Panel With TSH       Follow up plan: Return in about 6 weeks (around 09/18/2019) for COPD and MOOD + PAIN.

## 2019-08-07 NOTE — Assessment & Plan Note (Addendum)
Chronic, ongoing, severe with O2 dependence. Continue current medication and collaboration with pulmonary, palliative, and CCM team.  Adjust regimen as needed.  Refill on Hycodan sent.

## 2019-08-07 NOTE — Assessment & Plan Note (Signed)
Ongoing with severe COPD.  Recommend he continue protein shakes 3 times per day.  Educated on stages of COPD.

## 2019-08-07 NOTE — Assessment & Plan Note (Addendum)
Chronic, ongoing with O2 use daily.  Continue current medication regimen and adjust as needed + collaboration with pulmonary and palliative team.  Return to office in 6 weeks.

## 2019-08-07 NOTE — Assessment & Plan Note (Signed)
I have recommended complete cessation of tobacco use. I have discussed various options available for assistance with tobacco cessation including over the counter methods (Nicotine gum, patch and lozenges). We also discussed prescription options (Chantix, Nicotine Inhaler / Nasal Spray). The patient is not interested in pursuing any prescription tobacco cessation options at this time.  

## 2019-08-07 NOTE — Patient Instructions (Signed)

## 2019-08-07 NOTE — Assessment & Plan Note (Signed)
Acute with improvement at this time.  Continue Prednisone 10 MG daily + current inhaler regimen and use of O2.  Continue collaboration with pulmonary.  Hycodan refill sent in.  Return to office in 6 weeks.

## 2019-08-07 NOTE — Assessment & Plan Note (Signed)
Chronic, ongoing, continues to be exacerbated by his severe COPD and recent grieving. Continue Sertraline 150 MG daily and continue Buspar 10 MG BID, he reports these work well.  He denies SI/HI.   Return in 6 weeks.  Would avoid benzo at this time, as concern for benzo and opioid combination use in this patient with severe COPD.

## 2019-08-07 NOTE — Assessment & Plan Note (Signed)
New diagnosis.  Started on Metoprolol 12.5 MG BID in hospital.  HR 70's today.  Continue this regimen and adjust as needed.

## 2019-08-08 ENCOUNTER — Other Ambulatory Visit: Payer: Self-pay | Admitting: Nurse Practitioner

## 2019-08-08 DIAGNOSIS — J441 Chronic obstructive pulmonary disease with (acute) exacerbation: Secondary | ICD-10-CM

## 2019-08-08 DIAGNOSIS — E875 Hyperkalemia: Secondary | ICD-10-CM

## 2019-08-08 DIAGNOSIS — K861 Other chronic pancreatitis: Secondary | ICD-10-CM

## 2019-08-08 LAB — CBC WITH DIFFERENTIAL/PLATELET
Basophils Absolute: 0.1 10*3/uL (ref 0.0–0.2)
Basos: 1 %
EOS (ABSOLUTE): 0 10*3/uL (ref 0.0–0.4)
Eos: 0 %
Hematocrit: 43.6 % (ref 37.5–51.0)
Hemoglobin: 14.7 g/dL (ref 13.0–17.7)
Immature Grans (Abs): 0.1 10*3/uL (ref 0.0–0.1)
Immature Granulocytes: 1 %
Lymphocytes Absolute: 0.4 10*3/uL — ABNORMAL LOW (ref 0.7–3.1)
Lymphs: 5 %
MCH: 32 pg (ref 26.6–33.0)
MCHC: 33.7 g/dL (ref 31.5–35.7)
MCV: 95 fL (ref 79–97)
Monocytes Absolute: 0.3 10*3/uL (ref 0.1–0.9)
Monocytes: 3 %
Neutrophils Absolute: 8.1 10*3/uL — ABNORMAL HIGH (ref 1.4–7.0)
Neutrophils: 90 %
Platelets: 304 10*3/uL (ref 150–450)
RBC: 4.59 x10E6/uL (ref 4.14–5.80)
RDW: 12.8 % (ref 11.6–15.4)
WBC: 8.9 10*3/uL (ref 3.4–10.8)

## 2019-08-08 LAB — COMPREHENSIVE METABOLIC PANEL
ALT: 49 IU/L — ABNORMAL HIGH (ref 0–44)
AST: 15 IU/L (ref 0–40)
Albumin/Globulin Ratio: 1.5 (ref 1.2–2.2)
Albumin: 3.9 g/dL (ref 3.8–4.9)
Alkaline Phosphatase: 122 IU/L — ABNORMAL HIGH (ref 39–117)
BUN/Creatinine Ratio: 26 — ABNORMAL HIGH (ref 9–20)
BUN: 28 mg/dL — ABNORMAL HIGH (ref 6–24)
Bilirubin Total: <0.2 mg/dL (ref 0.0–1.2)
CO2: 27 mmol/L (ref 20–29)
Calcium: 9.3 mg/dL (ref 8.7–10.2)
Chloride: 101 mmol/L (ref 96–106)
Creatinine, Ser: 1.06 mg/dL (ref 0.76–1.27)
GFR calc Af Amer: 90 mL/min/{1.73_m2} (ref >59)
GFR calc non Af Amer: 78 mL/min/{1.73_m2} (ref >59)
Globulin, Total: 2.6 g/dL (ref 1.5–4.5)
Glucose: 142 mg/dL — ABNORMAL HIGH (ref 65–99)
Potassium: 5.5 mmol/L — ABNORMAL HIGH (ref 3.5–5.2)
Sodium: 142 mmol/L (ref 134–144)
Total Protein: 6.5 g/dL (ref 6.0–8.5)

## 2019-08-08 LAB — THYROID PANEL WITH TSH
Free Thyroxine Index: 1.2 (ref 1.2–4.9)
T3 Uptake Ratio: 26 % (ref 24–39)
T4, Total: 4.6 ug/dL (ref 4.5–12.0)
TSH: 0.874 u[IU]/mL (ref 0.450–4.500)

## 2019-08-08 NOTE — Progress Notes (Signed)
Outpatient labs.

## 2019-08-08 NOTE — Progress Notes (Signed)
Please let Magdiel know labs have returned and white blood cell count is improving, coming down, this is good.  Glucose level remains elevated on labs, since just had A1C (diabetes check) showing level 6.1% prediabetes, will recheck this next visit and continue to monitor sugars.  Recommend cutting back on foods and drinks high in sugar and carbohydrates at home.  Potassium was slightly elevated on these labs, recommend cutting back on foods high in potassium such as bananas and sweet potatoes.  Liver function labs are coming down.  Thyroid is normal.  I would like to check potassium on labs again in 4 weeks, please schedule outpatient lab visit for him in 4 weeks.  Thank you.  If any questions let me know.

## 2019-08-14 ENCOUNTER — Telehealth: Payer: Self-pay

## 2019-08-14 NOTE — Telephone Encounter (Signed)
Phone call placed to patient's sister, [name], to check in on patient and to offer to schedule a visit with Palliative NP. Per [name], patient is doing ok and does not feel as if patient needs a visit at this time. Encouraged [name] to call if something changes and she feels a visit is needed. Will update Palliative NP

## 2019-08-23 ENCOUNTER — Ambulatory Visit (INDEPENDENT_AMBULATORY_CARE_PROVIDER_SITE_OTHER): Payer: Medicare Other | Admitting: Pharmacist

## 2019-08-23 ENCOUNTER — Other Ambulatory Visit: Payer: Self-pay | Admitting: Nurse Practitioner

## 2019-08-23 DIAGNOSIS — K08109 Complete loss of teeth, unspecified cause, unspecified class: Secondary | ICD-10-CM

## 2019-08-23 DIAGNOSIS — J449 Chronic obstructive pulmonary disease, unspecified: Secondary | ICD-10-CM

## 2019-08-23 DIAGNOSIS — J9611 Chronic respiratory failure with hypoxia: Secondary | ICD-10-CM

## 2019-08-23 NOTE — Chronic Care Management (AMB) (Signed)
Chronic Care Management   Follow Up Note   08/23/2019 Name: Austin Blevins MRN: [id number] DOB: 18-Dec-1960  Referred by: [name], NP Reason for referral : Chronic Care Management (Medication Management)   Austin Blevins is a 59 y.o. year old male who is a primary care patient of [name], Austin Rank, NP. The CCM team was consulted for assistance with chronic disease management and care coordination needs.    Contacted patient for medication management needs.   Review of patient status, including review of consultants reports, relevant laboratory and other test results, and collaboration with appropriate care team members and the patient's provider was performed as part of comprehensive patient evaluation and provision of chronic care management services.    SDOH (Social Determinants of Health) assessments performed: No See Care Plan activities for detailed interventions related to [facility])     Outpatient Encounter Medications as of 08/23/2019  Medication Sig Note  . albuterol (PROVENTIL) (2.5 MG/3ML) 0.083% nebulizer solution USE ONE VIAL IN NEBULIZER EVERY SIX HOURS AS NEEDED FOR WHEEZING OR SHORTNESS OF BREATH   . albuterol (VENTOLIN HFA) 108 (90 Base) MCG/ACT inhaler Inhale 2 puffs into the lungs every 4 (four) hours as needed for wheezing or shortness of breath.   . busPIRone (BUSPAR) 10 MG tablet TAKE ONE TABLET TWICE DAILY   . Fluticasone-Salmeterol (ADVAIR DISKUS) 250-50 MCG/DOSE AEPB Inhale 1 puff into the lungs 2 (two) times daily.   [name] Kitchen HYDROcodone-homatropine (HYCODAN) 5-1.5 MG/5ML syrup Take 5 mLs by mouth every 8 (eight) hours as needed.   . metoprolol tartrate (LOPRESSOR) 25 MG tablet Take 0.5 tablets (12.5 mg total) by mouth 2 (two) times daily.   . montelukast (SINGULAIR) 10 MG tablet TAKE ONE TABLET AT BEDTIME   . omeprazole (PRILOSEC) 20 MG capsule TAKE 1 CAPSULE BY MOUTH EVERY DAY   . oxyCODONE (OXY IR/ROXICODONE) 5 MG immediate release tablet Take 5 mg by mouth every 6  (six) hours as needed.   . predniSONE (DELTASONE) 10 MG tablet Take 10 mg by mouth daily with breakfast.   . rOPINIRole (REQUIP) 0.5 MG tablet TAKE ONE TABLET EVERY MORNING AND TAKE THREE TABLETS AT BEDTIME AS NEEDED FOR RESTLESS LEG SYNDROME   . sertraline (ZOLOFT) 100 MG tablet Take 1.5 tablets (150 mg total) by mouth daily.   [name] Kitchen SPIRIVA HANDIHALER 18 MCG inhalation capsule INHALE CONTENTS OF 1 CAPSULE AS DIRECTEDEVERY DAY WITH HANDIHALER   . [DISCONTINUED] albuterol (PROVENTIL HFA;VENTOLIN HFA) 108 (90 Base) MCG/ACT inhaler Inhale 2 puffs into the lungs every 4 (four) hours as needed for wheezing or shortness of breath. 12/13/2018: Using 4x day  . [DISCONTINUED] albuterol (VENTOLIN HFA) 108 (90 Base) MCG/ACT inhaler INHALE 2 PUFFS INTO THE LUNGS EVERY 4 HOURS AS NEEDED FOR WHEEZING OR SHORTNESS OF BREATH   . [DISCONTINUED] rOPINIRole (REQUIP) 0.5 MG tablet TAKE ONE TABLET BY MOUTH EVERY MORNING AND 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    No facility-administered encounter medications on file as of 08/23/2019.     Objective:   Goals Addressed            This Visit's Progress     Patient Stated   . PharmD "I have a lot of medications" (pt-stated)       CARE PLAN ENTRY (see longtitudinal plan of care for additional care plan information)  Current Barriers:  . Polypharmacy - complex patient with multiple chronic conditions including COPD, tobacco abuse, depression, RLS . [name] notes his biggest concern is  not having his dentures. These were lost during his hospitalization. He notes that he can only eat "mushy stuff" and that he has been taking the oxycodone prescribed for his back pain moreso for mouth pain, though he denies any redness/swelling to indicate a gum infection. He notes that he has never seen a dentist and would like a referral. . Receives medications in pill packages from [name].  o COPD: follows w/ palliative care, pulmonology at [facility]. Continues  Advair 250/50 mcg BID + Spiriva 18 mcg daily, albuterol HFA or neb PRN; prednisone 10 mg daily. Notes that he is sometimes forgetting his evening dose of Advair. o Depression/chronic pain: sertraline 150 mg daily, buspirone 10 mg BID o RLS: ropinirole 0.5 mg QAM, 1.5 mg QPM o GERD: omeprazole 20 mg QAM  Pharmacist Clinical Goal(s):  [name] Kitchen Over the next 90 days, patient will work with PharmD and PCP to address needs related to optmized medication management  Interventions: . Comprehensive medication review performed, reviewed outside fill history as well.  . Counseled on adherence/memory strategies for remembering second dose of Advair. Unfortunately, [place] Medicaid does not cover any triple therapy options or once daily LABA/ICS options.  . Will collaborate with PCP for dental referral.   Patient Self Care Activities:  . Self administers medications as prescribed  Please see past updates related to this goal by clicking on the "Past Updates" button in the selected goal          Plan:  - Scheduled f/u appt 12/06/19  Catie Darnelle Maffucci, PharmD, [place] [employer] [phone]

## 2019-08-23 NOTE — Patient Instructions (Signed)
Visit Information  Goals Addressed            This Visit's Progress     Patient Stated   . PharmD "I have a lot of medications" (pt-stated)       CARE PLAN ENTRY (see longtitudinal plan of care for additional care plan information)  Current Barriers:  . Polypharmacy - complex patient with multiple chronic conditions including COPD, tobacco abuse, depression, RLS . Austin Blevins notes his biggest concern is not having his dentures. These were lost during his hospitalization. He notes that he can only eat "mushy stuff" and that he has been taking the oxycodone prescribed for his back pain moreso for mouth pain, though he denies any redness/swelling to indicate a gum infection. He notes that he has never seen a dentist and would like a referral. . Receives medications in pill packages from [facility].  o COPD: follows w/ palliative care, pulmonology at [facility]. Continues Advair 250/50 mcg BID + Spiriva 18 mcg daily, albuterol HFA or neb PRN; prednisone 10 mg daily. Notes that he is sometimes forgetting his evening dose of Advair. o Depression/chronic pain: sertraline 150 mg daily, buspirone 10 mg BID o RLS: ropinirole 0.5 mg QAM, 1.5 mg QPM o GERD: omeprazole 20 mg QAM  Pharmacist Clinical Goal(s):  [name] Kitchen Over the next 90 days, patient will work with PharmD and PCP to address needs related to optmized medication management  Interventions: . Comprehensive medication review performed, reviewed outside fill history as well.  . Counseled on adherence/memory strategies for remembering second dose of Advair. Unfortunately, [place] Medicaid does not cover any triple therapy options or once daily LABA/ICS options.  . Will collaborate with PCP for dental referral.   Patient Self Care Activities:  . Self administers medications as prescribed  Please see past updates related to this goal by clicking on the "Past Updates" button in the selected goal         Patient verbalizes understanding of  instructions provided today.   Plan:  - Scheduled f/u appt 12/06/19  Catie Feliz Beam, PharmD, [facility] Clinical Pharmacist [facility] [phone]

## 2019-08-24 ENCOUNTER — Telehealth: Payer: Self-pay | Admitting: Nurse Practitioner

## 2019-08-24 NOTE — Chronic Care Management (AMB) (Signed)
  Care Management   Note  08/24/2019 Name: [name] MRN: [id number] DOB: Nov 12, 1960  Austin Blevins is a 59 y.o. year old male who is a primary care patient of [name], NP and is actively engaged with the care management team. I reached out to [name] by phone today to assist with re-scheduling a follow up visit with the RN Case Manager  Follow up plan: Telephone appointment with care management team member scheduled for: 09/26/2019.  Gwenevere Ghazi  Care Guide, Embedded Care Coordination [facility]  [place], [place] [address] Direct Dial: [phone] [email or web address] Website: [email or web address]

## 2019-08-30 ENCOUNTER — Telehealth: Payer: Self-pay

## 2019-08-30 ENCOUNTER — Ambulatory Visit: Payer: Self-pay | Admitting: General Practice

## 2019-08-30 DIAGNOSIS — J9611 Chronic respiratory failure with hypoxia: Secondary | ICD-10-CM

## 2019-08-30 DIAGNOSIS — K08109 Complete loss of teeth, unspecified cause, unspecified class: Secondary | ICD-10-CM

## 2019-08-30 DIAGNOSIS — Z972 Presence of dental prosthetic device (complete) (partial): Secondary | ICD-10-CM

## 2019-08-30 DIAGNOSIS — F331 Major depressive disorder, recurrent, moderate: Secondary | ICD-10-CM

## 2019-08-30 NOTE — Patient Instructions (Signed)
Visit Information  Goals Addressed            This Visit's Progress   . RN- COPD/Depression/Chronic Pancreatitis Management (pt-stated)       Current Barriers:  [name] Kitchen Knowledge deficits related to basic COPD self care/management . Knowledge deficit related to Depression and Chronic pancreatitis . Knowledge deficit related to importance of energy conservation  . Financial Barriers   Case Manager Clinical Goal(s):  Over the next 120 days patient will report using inhalers as prescribed including rinsing mouth after use  Over the next 120 days patient will report utilizing pursed lip breathing for shortness of breath  Over the next 120 days, patient will be able to verbalize understanding of COPD action plan and when to seek appropriate levels of medical care  Over the next 120 days, patient will engage in lite exercise as tolerated to build/regain stamina and strength and reduce shortness of breath through activity tolerance  Over the next 120 days, patient will verbalize basic understanding of COPD disease process and self care activities  Over the next 90 days the patient will work with pulmonary provider to get a new nebulizer machine as the one he is using is one he got at a yard sale- completed  Over the next 90 days the patient with work with the oxygen company to get needed oxygen tanks. The patient now has an [employer] machine and was told that [employer], would not be supplier anymore. The patient is trying to confirm the information he needs so he will not be without his oxygen.  Over the next 120 days, the patient will work with the [facility] for effective management of depression and chronic pancreatitis   Interventions:   Provided patient with COPD action plan and reinforced importance of daily self assessment  Discussed Pulmonary Rehab and offered to assist with referral placement  Advised patient to engage in light exercise as tolerated 3-5 days a week- patient  easily gets shob when ambulating  Discussed with patient copd symptoms, visible shob on exertion and cough present on todays visit   Messaging sent to Dr. Meredeth Ide asking for an order for a new nebulizer machine -Patient stated he bought his nebulizer at a yard sale and it is older. Made him aware we could work towards getting him a new nebulizer machine.   Patient reports his new medicare benefit has started, he is no longer solely Medicaid.   Supplied the patient with [employer] ensure and coupons at today's visit   Patient Self Care Activities:  Takes medications as prescribed including inhalers  Practices and uses pursed lip breathing for shortness of breath recovery and prevention  Self assesses COPD action plan zone and makes appointment with provider if in the yellow zone for 48 hours without improvement.  Engages in light exercise 3-5 days a week  Utilizes infection prevention strategies to reduce risk of respiratory infection   Please see past updates related to this goal by clicking on the "Past Updates" button in the selected goal      . COMPLETED: RNCM: I now would like to get the rails for my bed       CARE PLAN ENTRY (see longtitudinal plan of care for additional care plan information)  Current Barriers:  [name] Kitchen Knowledge Deficits related to how to obtain bed rails to go on his hospital bed due to chronic disease processes  . Lacks caregiver support.  . Corporate treasurer.   Nurse Case Manager Clinical Goal(s):  .  Over the next 90 days, patient will verbalize understanding of plan for obtaining bed rails for his hospital bed her uses in home due to his chronic health  conditions . Over the next 90 days, patient will work with [facility] and CCM team to address needs related to DME needs  . Over the next 90 days, patient will demonstrate a decrease in COPD exacerbations as evidenced by taking medications as prescribed, following the poc, and having needed DME to remain safe in  the home environment . Over the next 90 days, patient will work with CM clinical social worker to for depression and other low income help . Over the next 90 days, patient will work with care guides  (community agency) to food resources and help- [employer] referral done today- 07-12-2019  Interventions:  . Evaluation of current treatment plan related to the need now for the patient to have rails on his hospital bed for safety and patient's adherence to plan as established by provider. . Provided education to patient re: discussed that the [employer] will call and they will have someone come out and place the rails on the bed for the patient- per the patient they have not contacted him yet to come out and put the rails on the bed. A second call placed to [facility] supply today. They now say they need an order for the bed rails.  Message sent to pcp to get bed rail order for his hospital bed. Will let the patient know with a follow up call. Per the patient on today's call (07/19/2019) the company is coming out tomorrow to place the rails on the patient's bed. Steele Sizer with [employer] and the interdisciplinary team  regarding the patients request for rails to be placed on his hospital bed- done . Discussed plans with patient for ongoing care management follow up and provided patient with direct contact information for care management team . Care Guide referral for food resources in the community- done . Social Work referral for depression and LIS information- in progress   Patient Self Care Activities:  . Patient verbalizes understanding of plan to have the [employer] company come in and install the bed rails on his hospital bed . Attends all scheduled provider appointments . Calls provider office for new concerns or questions . Unable to independently get rails for his hospital bed as evidence of not know what DME company supplied hospital bed    Please see past updates related to this goal by clicking on the "Past Updates" button in the selected goal      . RNCM: Pt- "They lost my dentures at the hospital" (pt-stated)       CARE PLAN ENTRY (see longitudinal plan of care for additional care plan information)  Current Barriers:  [name] Kitchen Knowledge Deficits related to losing dentures and the request for help in calling the hospital to find out about his dentures.  Darylene Price caregiver support.  . Corporate treasurer.   Nurse Case Manager Clinical Goal(s):  [name] Kitchen Over the next 120 days, patient will verbalize understanding of plan for getting new dentures to replace lost dentures . Over the next 120 days, patient will work with [facility] and pcp to address needs related to referral to dentist to get set up for an appointment to get new dentures.   Interventions:  . Inter-disciplinary care team collaboration (see longitudinal plan of care) . Evaluation of current treatment plan related to replacing lost dentures  and patient's adherence to plan as established by provider. . Advised patient to call the office if he does not hear from the dentist . Provided education to patient re: referral to dentistry done . Collaborated with pcp and RNCM regarding referral for dentistry . Reviewed scheduled/upcoming provider appointments including: has not heard from the dentist yet.   Patient Self Care Activities:  . Patient verbalizes understanding of plan to see dentist for new dentures . Attends all scheduled provider appointments . Calls provider office for new concerns or questions . Unable to independently determine how to get new dentures after losing his dentures during recent hospitalization.  Initial goal documentation     . RNCM: Pt-"I need help with controlling this pain" (pt-stated)       CARE PLAN ENTRY (see longtitudinal plan of care for additional care plan information)  Current Barriers:  [name] Kitchen Knowledge Deficits related to pain control in a  patient with complex medical conditions with Chronic pain that is getting worse per the patient  . Lacks caregiver support.  . Film/video editor.  . Anxiety attacks related to pain   Nurse Case Manager Clinical Goal(s):  [name] Kitchen Over the next 90 days, patient will verbalize understanding of plan for managing chronic pain and discomfort  . Over the next 90 days, patient will work with [facility], [place] team, and pcp  to address needs related to worsening Chronic pain and discomfort and the patient verbalizing he needs assistance with a pain specialist/clinic referral or other resources to help in managing his pain and discomfort . Over the next 90 days, patient will demonstrate a decrease in pain  exacerbations as evidenced by stated decrease in pain level and discomfort   Interventions:  . Evaluation of current treatment plan related to pain management  and patient's adherence to plan as established by provider. . Provided education to patient re: discussing pain concerns with pcp . Reviewed medications with patient and discussed compliance  . Collaborated with pcp and  CCM pharmacist regarding uncontrolled chronic pain and the patients request for a referral to a pain specialist/clinic or other recommendations for helping with pain control.  The patient was hospitalized and the etiology of pain found. The patient has adequate pain medications now.  . Called Dr. Gust Brooms office and spoke to the staff. The staff is faxing over the xray results, lab results, and the office visit note from recent visit for the pcp.  The staff is also sending a message to have Dr. Gust Brooms nurse call the [facility] to discuss and collaborate care for the patient. Done  . Advised front office staff to watch for fax from Dr. Gust Brooms office and let Jolene know when it was available.  Done . Spoke to the patient and advised the patient to call Dr. Gust Brooms office and let them know about the changes he is experiencing. Also advised the  patient for worsening shob or unrelieved pain to go to the ED to be evaluated. The patient verbalized understanding. Done . Received a call back from [facility] the nurse with Dr. Raul Del.  Caryl Pina reported that Dr. Raul Del could not find any acute findings that would be causing the increased pain and discomfort. He discussed with the patient a referral back to the pcp office for a referral for pain specialist evaluation for possibly shots to help with pain. Also she said Dr. Raul Del feels a psychiatric referral would be beneficial for the patient.  Dr. Raul Del did prescribe Buspar for the patient to take. Done  .  Received fax from Dr. Reita Cliche office. Staff has placed in Jolene's book for review. Done . Education on changes of level or intensity of pain if it is uncontrollable or gets worse to notify provider.    Patient Self Care Activities:  . Patient verbalizes understanding of plan to discuss pain control options with the interdisciplinary team for recommendations and resources  . Self administers medications as prescribed . Attends all scheduled provider appointments . Performs IADL's independently . Calls provider office for new concerns or questions . Unable to independently control chronic pain and discomfort  . Lacks social connections . Unable to perform IADLs independently  Please see past updates related to this goal by clicking on the "Past Updates" button in the selected goal         Patient verbalizes understanding of instructions provided today.   The care management team will reach out to the patient again over the next 30 to 60 days.   Alto Denver RN, MSN, CCM Community Care Coordinator [place]  [facility] Mobile: [phone]

## 2019-08-30 NOTE — Chronic Care Management (AMB) (Signed)
Chronic Care Management   Follow Up Note   08/30/2019 Name: Austin Blevins MRN: [id number] DOB: [birth year]  Referred by: [name], NP Reason for referral : Chronic Care Management (RNCM Chronic Disease Amanagement and Care coordination needs)   Austin Blevins is a 59 y.o. year old male who is a primary care patient of [name], [name], NP. The CCM team was consulted for assistance with chronic disease management and care coordination needs.    Review of patient status, including review of consultants reports, relevant laboratory and other test results, and collaboration with appropriate care team members and the patient's provider was performed as part of comprehensive patient evaluation and provision of chronic care management services.    SDOH (Social Determinants of Health) assessments performed: No See Care Plan activities for detailed interventions related to [facility])     Outpatient Encounter Medications as of 08/30/2019  Medication Sig Note  . albuterol (PROVENTIL) (2.5 MG/3ML) 0.083% nebulizer solution USE ONE VIAL IN NEBULIZER EVERY SIX HOURS AS NEEDED FOR WHEEZING OR SHORTNESS OF BREATH   . albuterol (VENTOLIN HFA) 108 (90 Base) MCG/ACT inhaler Inhale 2 puffs into the lungs every 4 (four) hours as needed for wheezing or shortness of breath.   . busPIRone (BUSPAR) 10 MG tablet TAKE ONE TABLET TWICE DAILY   . Fluticasone-Salmeterol (ADVAIR DISKUS) 250-50 MCG/DOSE AEPB Inhale 1 puff into the lungs 2 (two) times daily.   [name] Kitchen HYDROcodone-homatropine (HYCODAN) 5-1.5 MG/5ML syrup Take 5 mLs by mouth every 8 (eight) hours as needed.   . metoprolol tartrate (LOPRESSOR) 25 MG tablet Take 0.5 tablets (12.5 mg total) by mouth 2 (two) times daily.   . montelukast (SINGULAIR) 10 MG tablet TAKE ONE TABLET AT BEDTIME   . omeprazole (PRILOSEC) 20 MG capsule TAKE 1 CAPSULE BY MOUTH EVERY DAY   . oxyCODONE (OXY IR/ROXICODONE) 5 MG immediate release tablet Take 5 mg by mouth every 6 (six) hours  as needed.   . predniSONE (DELTASONE) 10 MG tablet Take 10 mg by mouth daily with breakfast.   . rOPINIRole (REQUIP) 0.5 MG tablet TAKE ONE TABLET EVERY MORNING AND TAKE THREE TABLETS AT BEDTIME AS NEEDED FOR RESTLESS LEG SYNDROME   . sertraline (ZOLOFT) 100 MG tablet Take 1.5 tablets (150 mg total) by mouth daily.   [name] Kitchen SPIRIVA HANDIHALER 18 MCG inhalation capsule INHALE CONTENTS OF 1 CAPSULE AS DIRECTEDEVERY DAY WITH HANDIHALER   . [DISCONTINUED] albuterol (PROVENTIL HFA;VENTOLIN HFA) 108 (90 Base) MCG/ACT inhaler Inhale 2 puffs into the lungs every 4 (four) hours as needed for wheezing or shortness of breath. 12/13/2018: Using 4x day  . [DISCONTINUED] albuterol (VENTOLIN HFA) 108 (90 Base) MCG/ACT inhaler INHALE 2 PUFFS INTO THE LUNGS EVERY 4 HOURS AS NEEDED FOR WHEEZING OR SHORTNESS OF BREATH   . [DISCONTINUED] rOPINIRole (REQUIP) 0.5 MG tablet TAKE ONE TABLET BY MOUTH EVERY MORNING AND 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    No facility-administered encounter medications on file as of 08/30/2019.     Objective:   Goals Addressed            This Visit's Progress   . RN- COPD/Depression/Chronic Pancreatitis Management (pt-stated)       Current Barriers:  [name] Kitchen Knowledge deficits related to basic COPD self care/management . Knowledge deficit related to Depression and Chronic pancreatitis . Knowledge deficit related to importance of energy conservation  . Financial Barriers   Case Manager Clinical Goal(s):  Over the next 120 days patient will report using  inhalers as prescribed including rinsing mouth after use  Over the next 120 days patient will report utilizing pursed lip breathing for shortness of breath  Over the next 120 days, patient will be able to verbalize understanding of COPD action plan and when to seek appropriate levels of medical care  Over the next 120 days, patient will engage in lite exercise as tolerated to build/regain stamina and strength and  reduce shortness of breath through activity tolerance  Over the next 120 days, patient will verbalize basic understanding of COPD disease process and self care activities  Over the next 90 days the patient will work with pulmonary provider to get a new nebulizer machine as the one he is using is one he got at a yard sale- completed  Over the next 90 days the patient with work with the oxygen company to get needed oxygen tanks. The patient now has an Inogen machine and was told that [employer], would not be supplier anymore. The patient is trying to confirm the information he needs so he will not be without his oxygen.  Over the next 120 days, the patient [facility] effective management of depression and chronic pancreatitis   Interventions:   Provided patient with COPD action plan and reinforced importance of daily self assessment  Discussed Pulmonary Rehab and offered to assist with referral placement  Advised patient to engage in light exercise as tolerated 3-5 days a week- patient easily gets shob when ambulating  Discussed with patient copd symptoms, visible shob on exertion and cough present on todays visit   Messaging sent to Dr. Meredeth IdeFleming asking for an order for a new nebulizer machine -Patient stated he bought his nebulizer at a yard sale and it is older. Made him aware we could work towards getting him a new nebulizer machine.   Patient reports his new medicare benefit has started, he is no longer solely Medicaid.   Supplied the patient with Vanilla ensure and coupons at today's visit   Patient Self Care Activities:  Takes medications as prescribed including inhalers  Practices and uses pursed lip breathing for shortness of breath recovery and prevention  Self assesses COPD action plan zone and makes appointment with provider if in the yellow zone for 48 hours without improvement.  Engages in light exercise 3-5 days a week  Utilizes infection  prevention strategies to reduce risk of respiratory infection   Please see past updates related to this goal by clicking on the "Past Updates" button in the selected goal      . COMPLETED: RNCM: I now would like to get the rails for my bed       CARE PLAN ENTRY (see longtitudinal plan of care for additional care plan information)  Current Barriers:  [name] Kitchen. Knowledge Deficits related to how to obtain bed rails to go on his hospital bed due to chronic disease processes  . Lacks caregiver support.  . Corporate treasurerinancial Constraints.   Nurse Case Manager Clinical Goal(s):  [name] Kitchen. Over the next 90 days, patient will verbalize understanding of plan for obtaining bed rails for his hospital bed her uses in home due to his chronic health  conditions . Over the next 90 days, [facility] and [facility] team to address needs related to DME needs  . Over the next 90 days, patient will demonstrate a decrease in COPD exacerbations as evidenced by taking medications as prescribed, following the poc, and having needed DME to remain  safe in the home environment . Over the next 90 days, patient will work with CM clinical social worker to for depression and other low income help . Over the next 90 days, patient will work with care guides  (community agency) to food resources and help- [employer] referral done today- 07-12-2019  Interventions:  . Evaluation of current treatment plan related to the need now for the patient to have rails on his hospital bed for safety and patient's adherence to plan as established by provider. . Provided education to patient re: discussed that the [employer] will call and they will have someone come out and place the rails on the bed for the patient- per the patient they have not contacted him yet to come out and put the rails on the bed. A second call placed to [facility] supply today. They now say they need an order for the bed rails.  Message sent to pcp to get bed  rail order for his hospital bed. Will let the patient know with a follow up call. Per the patient on today's call (07/19/2019) the company is coming out tomorrow to place the rails on the patient's bed. Steele Sizer with [employer] and the interdisciplinary team  regarding the patients request for rails to be placed on his hospital bed- done . Discussed plans with patient for ongoing care management follow up and provided patient with direct contact information for care management team . Care Guide referral for food resources in the community- done . Social Work referral for depression and LIS information- in progress   Patient Self Care Activities:  . Patient verbalizes understanding of plan to have the [employer] company come in and install the bed rails on his hospital bed . Attends all scheduled provider appointments . Calls provider office for new concerns or questions . Unable to independently get rails for his hospital bed as evidence of not know what DME company supplied hospital bed   Please see past updates related to this goal by clicking on the "Past Updates" button in the selected goal      . RNCM: Pt- "They lost my dentures at the hospital" (pt-stated)       CARE PLAN ENTRY (see longitudinal plan of care for additional care plan information)  Current Barriers:  [name] Kitchen Knowledge Deficits related to losing dentures and the request for help in calling the hospital to find out about his dentures.  Darylene Price caregiver support.  . Corporate treasurer.   Nurse Case Manager Clinical Goal(s):  [name] Kitchen Over the next 120 days, patient will verbalize understanding of plan for getting new dentures to replace lost dentures . Over the next 120 days, patient will work with [facility] and pcp to address needs related to referral to dentist to get set up for an appointment to get new dentures.   Interventions:  . Inter-disciplinary care team collaboration (see longitudinal plan of  care) . Evaluation of current treatment plan related to replacing lost dentures and patient's adherence to plan as established by provider. . Advised patient to call the office if he does not hear from the dentist . Provided education to patient re: referral to dentistry done . Collaborated with pcp and RNCM regarding referral for dentistry . Reviewed scheduled/upcoming provider appointments including: has not heard from the dentist yet.   Patient Self Care Activities:  . Patient verbalizes understanding of plan to see dentist for new dentures . Attends all scheduled provider appointments .  Calls provider office for new concerns or questions . Unable to independently determine how to get new dentures after losing his dentures during recent hospitalization.  Initial goal documentation     . RNCM: Pt-"I need help with controlling this pain" (pt-stated)       CARE PLAN ENTRY (see longtitudinal plan of care for additional care plan information)  Current Barriers:  [name] Kitchen Knowledge Deficits related to pain control in a patient with complex medical conditions with Chronic pain that is getting worse per the patient  . Lacks caregiver support.  . Corporate treasurer.  . Anxiety attacks related to pain   Nurse Case Manager Clinical Goal(s):  [name] Kitchen Over the next 90 days, patient will verbalize understanding of plan for managing chronic pain and discomfort  . Over the next 90 days, patient will work with [facility], CCM team, and pcp  to address needs related to worsening Chronic pain and discomfort and the patient verbalizing he needs assistance with a pain specialist/clinic referral or other resources to help in managing his pain and discomfort . Over the next 90 days, patient will demonstrate a decrease in pain  exacerbations as evidenced by stated decrease in pain level and discomfort   Interventions:  . Evaluation of current treatment plan related to pain management  and patient's adherence to plan as  established by provider. . Provided education to patient re: discussing pain concerns with pcp . Reviewed medications with patient and discussed compliance  . Collaborated with pcp and  CCM pharmacist regarding uncontrolled chronic pain and the patients request for a referral to a pain specialist/clinic or other recommendations for helping with pain control.  The patient was hospitalized and the etiology of pain found. The patient has adequate pain medications now.  . Called Dr. Reita Cliche office and spoke to the staff. The staff is faxing over the xray results, lab results, and the office visit note from recent visit for the pcp.  The staff is also sending a message to have Dr. Reita Cliche nurse call the [facility] to discuss and collaborate care for the patient. Done  . Advised front office staff to watch for fax from Dr. Reita Cliche office and let Jolene know when it was available.  Done . Spoke to the patient and advised the patient to call Dr. Reita Cliche office and let them know about the changes he is experiencing. Also advised the patient for worsening shob or unrelieved pain to go to the ED to be evaluated. The patient verbalized understanding. Done . Received a call back from [facility] the nurse with Dr. Meredeth Ide.  Morrie Sheldon reported that Dr. Meredeth Ide could not find any acute findings that would be causing the increased pain and discomfort. He discussed with the patient a referral back to the pcp office for a referral for pain specialist evaluation for possibly shots to help with pain. Also she said Dr. Meredeth Ide feels a psychiatric referral would be beneficial for the patient.  Dr. Meredeth Ide did prescribe Buspar for the patient to take. Done  . Received fax from Dr. Reita Cliche office. Staff has placed in Jolene's book for review. Done . Education on changes of level or intensity of pain if it is uncontrollable or gets worse to notify provider.    Patient Self Care Activities:  . Patient verbalizes understanding of  plan to discuss pain control options with the interdisciplinary team for recommendations and resources  . Self administers medications as prescribed . Attends all scheduled provider appointments . Performs IADL's independently . Calls provider  office for new concerns or questions . Unable to independently control chronic pain and discomfort  . Lacks social connections . Unable to perform IADLs independently  Please see past updates related to this goal by clicking on the "Past Updates" button in the selected goal          Plan:   The care management team will reach out to the patient again over the next 30 to 60 days.   Noreene Larsson RN, MSN, [facility] Mobile: [phone]

## 2019-09-07 ENCOUNTER — Ambulatory Visit: Payer: Self-pay | Admitting: *Deleted

## 2019-09-07 NOTE — Telephone Encounter (Signed)
Caller [name]) is not with the patient.  Last night she had difficulty getting his oxygen up to 80%. He is on 3L 02 this morning and sleeping upright when she left him earlier. He does have a wheeze, denies fever. Unsure about cough/phlegm. She will return to his care at 1:00pm. Urged her to call 911 if his Pox is below 88% or if he appears to be struggling to breath. Stated she understood these instructions.   No triage performed due to caller is not with the patient.

## 2019-09-08 ENCOUNTER — Telehealth: Payer: Self-pay

## 2019-09-08 ENCOUNTER — Ambulatory Visit: Payer: Self-pay | Admitting: General Practice

## 2019-09-08 ENCOUNTER — Other Ambulatory Visit: Payer: Self-pay

## 2019-09-08 DIAGNOSIS — Z66 Do not resuscitate: Secondary | ICD-10-CM

## 2019-09-08 DIAGNOSIS — F419 Anxiety disorder, unspecified: Secondary | ICD-10-CM

## 2019-09-08 DIAGNOSIS — J449 Chronic obstructive pulmonary disease, unspecified: Secondary | ICD-10-CM

## 2019-09-08 DIAGNOSIS — F331 Major depressive disorder, recurrent, moderate: Secondary | ICD-10-CM

## 2019-09-08 DIAGNOSIS — F17219 Nicotine dependence, cigarettes, with unspecified nicotine-induced disorders: Secondary | ICD-10-CM

## 2019-09-08 DIAGNOSIS — J441 Chronic obstructive pulmonary disease with (acute) exacerbation: Secondary | ICD-10-CM

## 2019-09-08 NOTE — Chronic Care Management (AMB) (Signed)
Chronic Care Management   Follow Up Note   09/08/2019 Name: Austin Blevins MRN: [id number] DOB: 06/17/1960  Referred by: [name], NP Reason for referral : Chronic Care Management (Incoming call from the patients sister: COPD exacerbation, depression needing assistance )   CASTOR Austin Blevins is a 59 y.o. year old male who is a primary care patient of [name], [name], NP. The CCM team was consulted for assistance with chronic disease management and care coordination needs.    Review of patient status, including review of consultants reports, relevant laboratory and other test results, and collaboration with appropriate care team members and the patient's provider was performed as part of comprehensive patient evaluation and provision of chronic care management services.    SDOH (Social Determinants of Health) assessments performed: Yes- evaluated if the patient is still smoking. The patients sister verbalized he was still smoking.  See Care Plan activities for detailed interventions related to [facility])     Outpatient Encounter Medications as of 09/08/2019  Medication Sig Note  . albuterol (PROVENTIL) (2.5 MG/3ML) 0.083% nebulizer solution USE ONE VIAL IN NEBULIZER EVERY SIX HOURS AS NEEDED FOR WHEEZING OR SHORTNESS OF BREATH   . albuterol (VENTOLIN HFA) 108 (90 Base) MCG/ACT inhaler Inhale 2 puffs into the lungs every 4 (four) hours as needed for wheezing or shortness of breath.   . busPIRone (BUSPAR) 10 MG tablet TAKE ONE TABLET TWICE DAILY   . Fluticasone-Salmeterol (ADVAIR DISKUS) 250-50 MCG/DOSE AEPB Inhale 1 puff into the lungs 2 (two) times daily.   [name] Kitchen HYDROcodone-homatropine (HYCODAN) 5-1.5 MG/5ML syrup Take 5 mLs by mouth every 8 (eight) hours as needed.   . metoprolol tartrate (LOPRESSOR) 25 MG tablet Take 0.5 tablets (12.5 mg total) by mouth 2 (two) times daily.   . montelukast (SINGULAIR) 10 MG tablet TAKE ONE TABLET AT BEDTIME   . omeprazole (PRILOSEC) 20 MG capsule TAKE 1  CAPSULE BY MOUTH EVERY DAY   . oxyCODONE (OXY IR/ROXICODONE) 5 MG immediate release tablet Take 5 mg by mouth every 6 (six) hours as needed.   . predniSONE (DELTASONE) 10 MG tablet Take 10 mg by mouth daily with breakfast.   . rOPINIRole (REQUIP) 0.5 MG tablet TAKE ONE TABLET EVERY MORNING AND TAKE THREE TABLETS AT BEDTIME AS NEEDED FOR RESTLESS LEG SYNDROME   . sertraline (ZOLOFT) 100 MG tablet Take 1.5 tablets (150 mg total) by mouth daily.   [name] Kitchen SPIRIVA HANDIHALER 18 MCG inhalation capsule INHALE CONTENTS OF 1 CAPSULE AS DIRECTEDEVERY DAY WITH HANDIHALER   . [DISCONTINUED] albuterol (PROVENTIL HFA;VENTOLIN HFA) 108 (90 Base) MCG/ACT inhaler Inhale 2 puffs into the lungs every 4 (four) hours as needed for wheezing or shortness of breath. 12/13/2018: Using 4x day  . [DISCONTINUED] albuterol (VENTOLIN HFA) 108 (90 Base) MCG/ACT inhaler INHALE 2 PUFFS INTO THE LUNGS EVERY 4 HOURS AS NEEDED FOR WHEEZING OR SHORTNESS OF BREATH   . [DISCONTINUED] rOPINIRole (REQUIP) 0.5 MG tablet TAKE ONE TABLET BY MOUTH EVERY MORNING AND 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    No facility-administered encounter medications on file as of 09/08/2019.     Objective:   Goals Addressed            This Visit's Progress   . RN- COPD/Depression/Chronic Pancreatitis Management (pt-stated)       Current Barriers:  [name] Kitchen Knowledge deficits related to basic COPD self care/management . Knowledge deficit related to Depression and Chronic pancreatitis . Knowledge deficit related to importance of energy conservation  .  Financial Barriers   Case Manager Clinical Goal(s):  Over the next 120 days patient will report using inhalers as prescribed including rinsing mouth after use  Over the next 120 days patient will report utilizing pursed lip breathing for shortness of breath  Over the next 120 days, patient will be able to verbalize understanding of COPD action plan and when to seek appropriate levels of  medical care  Over the next 120 days, patient will engage in lite exercise as tolerated to build/regain stamina and strength and reduce shortness of breath through activity tolerance  Over the next 120 days, patient will verbalize basic understanding of COPD disease process and self care activities  Over the next 90 days the patient will work with pulmonary provider to get a new nebulizer machine as the one he is using is one he got at a yard sale- completed  Over the next 90 days the patient with work with the oxygen company to get needed oxygen tanks. The patient now has an Inogen machine and was told that [employer], would not be supplier anymore. The patient is trying to confirm the information he needs so he will not be without his oxygen.  Over the next 120 days, the patient will work with the [facility] for effective management of depression and chronic pancreatitis   Interventions:   Provided patient with COPD action plan and reinforced importance of daily self assessment  Discussed Pulmonary Rehab and offered to assist with referral placement  Advised patient to engage in light exercise as tolerated 3-5 days a week- patient easily gets shob when ambulating  Discussed with patient copd symptoms, visible shortness of breath on exertion and cough present on todays visit   Messaging sent to Dr. Raul Del asking for an order for a new nebulizer machine -Patient stated he bought his nebulizer at a yard sale and it is older. Made him aware we could work towards getting him a new nebulizer machine.   Patient reports his new medicare benefit has started, he is no longer solely Medicaid.   Supplied the patient with Vanilla ensure and coupons at today's visit   Evaluation of the patients current condition: Incoming call from the patient's sister [name].  [name] expressed that she is concerned about the patient as his oxygen levels are in the 70's and they come up some but not a lot. The  patient is in bed and not getting up. She feels the patient has pneumonia. The patient does not want to go to the hospital because he fears they will "intubate" him. Education on the need to be evaluated at the hospital and that he could express his desire not to be intubated. The patients sister is really concerned and wanted to best know how to help the patient. Secure chat messaging with the pcp and CCM team. All agree the patient should be evaluated at the hospital. Message relayed to the patients sister [name]. Talked with the patients sister about tough decisions of what the patients wishes are and discussed the options of pallative/hospice care. The patient's sister is concerned and will try to talk the patient into going to the hospital.  The patient does continue to smoke. Empathetic listening and support given to the patients sister. The patients sister will update the RNCM of any changes.   Patient Self Care Activities:  Takes medications as prescribed including inhalers  Practices and uses pursed lip breathing for shortness of breath recovery and prevention  Self assesses COPD action  plan zone and makes appointment with provider if in the yellow zone for 48 hours without improvement.  Engages in light exercise 3-5 days a week  Utilizes infection prevention strategies to reduce risk of respiratory infection   Please see past updates related to this goal by clicking on the "Past Updates" button in the selected goal          Plan:   The care management team will reach out to the patient again over the next 30 to 60  days.    Alto Denver RN, MSN, CCM Community Care Coordinator [place]  [facility] Mobile: [phone]

## 2019-09-08 NOTE — Patient Instructions (Signed)
Visit Information  Goals Addressed            This Visit's Progress    RN- COPD/Depression/Chronic Pancreatitis Management (pt-stated)       Current Barriers:   Knowledge deficits related to basic COPD self care/management  Knowledge deficit related to Depression and Chronic pancreatitis  Knowledge deficit related to importance of energy conservation   Financial Barriers   Case Manager Clinical Goal(s):  Over the next 120 days patient will report using inhalers as prescribed including rinsing mouth after use  Over the next 120 days patient will report utilizing pursed lip breathing for shortness of breath  Over the next 120 days, patient will be able to verbalize understanding of COPD action plan and when to seek appropriate levels of medical care  Over the next 120 days, patient will engage in lite exercise as tolerated to build/regain stamina and strength and reduce shortness of breath through activity tolerance  Over the next 120 days, patient will verbalize basic understanding of COPD disease process and self care activities  Over the next 90 days the patient will work with pulmonary provider to get a new nebulizer machine as the one he is using is one he got at a yard sale- completed  Over the next 90 days the patient with work with the oxygen company to get needed oxygen tanks. The patient now has an [employer] machine and was told that [employer], would not be supplier anymore. The patient is trying to confirm the information he needs so he will not be without his oxygen.  Over the next 120 days, the patient will work with the [facility] for effective management of depression and chronic pancreatitis   Interventions:   Provided patient with COPD action plan and reinforced importance of daily self assessment  Discussed Pulmonary Rehab and offered to assist with referral placement  Advised patient to engage in light exercise as tolerated 3-5 days a week- patient  easily gets shob when ambulating  Discussed with patient copd symptoms, visible shortness of breath on exertion and cough present on todays visit   Messaging sent to Dr. Meredeth Ide asking for an order for a new nebulizer machine -Patient stated he bought his nebulizer at a yard sale and it is older. Made him aware we could work towards getting him a new nebulizer machine.   Patient reports his new medicare benefit has started, he is no longer solely Medicaid.   Supplied the patient with Vanilla ensure and coupons at today's visit   Evaluation of the patients current condition: Incoming call from the patient's sister [name].  [name] expressed that she is concerned about the patient as his oxygen levels are in the 70's and they come up some but not a lot. The patient is in bed and not getting up. She feels the patient has pneumonia. The patient does not want to go to the hospital because he fears they will "intubate" him. Education on the need to be evaluated at the hospital and that he could express his desire not to be intubated. The patients sister is really concerned and wanted to best know how to help the patient. Secure chat messaging with the pcp and CCM team. All agree the patient should be evaluated at the hospital. Message relayed to the patients sister [name]. Talked with the patients sister about tough decisions of what the patients wishes are and discussed the options of pallative/hospice care. The patient's sister is concerned and will try to talk the  patient into going to the hospital.  The patient does continue to smoke. Empathetic listening and support given to the patients sister. The patients sister will update the RNCM of any changes.   Patient Self Care Activities:  Takes medications as prescribed including inhalers  Practices and uses pursed lip breathing for shortness of breath recovery and prevention  Self assesses COPD action plan zone and makes appointment with provider if in the  yellow zone for 48 hours without improvement.  Engages in light exercise 3-5 days a week  Utilizes infection prevention strategies to reduce risk of respiratory infection   Please see past updates related to this goal by clicking on the "Past Updates" button in the selected goal         Patient verbalizes understanding of instructions provided today.   The care management team will reach out to the patient again over the next 30 to 60 days.   Noreene Larsson RN, MSN, [facility] Mobile: [phone]

## 2019-09-11 ENCOUNTER — Other Ambulatory Visit: Payer: Medicare Other

## 2019-09-13 ENCOUNTER — Other Ambulatory Visit: Payer: Self-pay

## 2019-09-13 ENCOUNTER — Other Ambulatory Visit: Payer: Medicare Other

## 2019-09-13 DIAGNOSIS — J441 Chronic obstructive pulmonary disease with (acute) exacerbation: Secondary | ICD-10-CM

## 2019-09-13 DIAGNOSIS — K861 Other chronic pancreatitis: Secondary | ICD-10-CM

## 2019-09-13 DIAGNOSIS — E875 Hyperkalemia: Secondary | ICD-10-CM

## 2019-09-14 LAB — COMPREHENSIVE METABOLIC PANEL
ALT: 11 IU/L (ref 0–44)
AST: 11 IU/L (ref 0–40)
Albumin/Globulin Ratio: 1.8 (ref 1.2–2.2)
Albumin: 4 g/dL (ref 3.8–4.9)
Alkaline Phosphatase: 98 IU/L (ref 39–117)
BUN/Creatinine Ratio: 13 (ref 9–20)
BUN: 15 mg/dL (ref 6–24)
Bilirubin Total: <0.2 mg/dL (ref 0.0–1.2)
CO2: 27 mmol/L (ref 20–29)
Calcium: 9.5 mg/dL (ref 8.7–10.2)
Chloride: 103 mmol/L (ref 96–106)
Creatinine, Ser: 1.15 mg/dL (ref 0.76–1.27)
GFR calc Af Amer: 81 mL/min/{1.73_m2} (ref >59)
GFR calc non Af Amer: 70 mL/min/{1.73_m2} (ref >59)
Globulin, Total: 2.2 g/dL (ref 1.5–4.5)
Glucose: 113 mg/dL — ABNORMAL HIGH (ref 65–99)
Potassium: 4.4 mmol/L (ref 3.5–5.2)
Sodium: 144 mmol/L (ref 134–144)
Total Protein: 6.2 g/dL (ref 6.0–8.5)

## 2019-09-14 LAB — CBC WITH DIFFERENTIAL/PLATELET
Basophils Absolute: 0.1 10*3/uL (ref 0.0–0.2)
Basos: 1 %
EOS (ABSOLUTE): 0.1 10*3/uL (ref 0.0–0.4)
Eos: 1 %
Hematocrit: 44.4 % (ref 37.5–51.0)
Hemoglobin: 15.3 g/dL (ref 13.0–17.7)
Immature Grans (Abs): 0.1 10*3/uL (ref 0.0–0.1)
Immature Granulocytes: 1 %
Lymphocytes Absolute: 0.8 10*3/uL (ref 0.7–3.1)
Lymphs: 8 %
MCH: 32.2 pg (ref 26.6–33.0)
MCHC: 34.5 g/dL (ref 31.5–35.7)
MCV: 94 fL (ref 79–97)
Monocytes Absolute: 0.5 10*3/uL (ref 0.1–0.9)
Monocytes: 5 %
Neutrophils Absolute: 8.9 10*3/uL — ABNORMAL HIGH (ref 1.4–7.0)
Neutrophils: 84 %
Platelets: 326 10*3/uL (ref 150–450)
RBC: 4.75 x10E6/uL (ref 4.14–5.80)
RDW: 12.4 % (ref 11.6–15.4)
WBC: 10.6 10*3/uL (ref 3.4–10.8)

## 2019-09-14 NOTE — Progress Notes (Signed)
Please let Austin Blevins or his sister know that his labs have returned.  Electrolytes are much improved with a normal potassium now and decreased in liver function tests (they had been a little elevated last check).  CBC shows a normal white blood cell count, but slight elevation in neutrophils.  I know he did start a Zpack recently.  I would keep taking this for any infection and if any worsening of symptoms notify office or immediately go to ER.  If any questions please let me know.

## 2019-09-18 ENCOUNTER — Encounter: Payer: Self-pay | Admitting: Nurse Practitioner

## 2019-09-18 ENCOUNTER — Other Ambulatory Visit: Payer: Self-pay

## 2019-09-18 ENCOUNTER — Ambulatory Visit (INDEPENDENT_AMBULATORY_CARE_PROVIDER_SITE_OTHER): Payer: Medicare Other | Admitting: Nurse Practitioner

## 2019-09-18 VITALS — BP 129/79 | HR 73 | Temp 97.9°F | Wt 123.4 lb

## 2019-09-18 DIAGNOSIS — F331 Major depressive disorder, recurrent, moderate: Secondary | ICD-10-CM | POA: Diagnosis not present

## 2019-09-18 DIAGNOSIS — J9611 Chronic respiratory failure with hypoxia: Secondary | ICD-10-CM

## 2019-09-18 DIAGNOSIS — M545 Low back pain, unspecified: Secondary | ICD-10-CM

## 2019-09-18 DIAGNOSIS — S22069D Unspecified fracture of T7-T8 vertebra, subsequent encounter for fracture with routine healing: Secondary | ICD-10-CM | POA: Insufficient documentation

## 2019-09-18 DIAGNOSIS — F17219 Nicotine dependence, cigarettes, with unspecified nicotine-induced disorders: Secondary | ICD-10-CM

## 2019-09-18 DIAGNOSIS — E43 Unspecified severe protein-calorie malnutrition: Secondary | ICD-10-CM | POA: Diagnosis not present

## 2019-09-18 DIAGNOSIS — J449 Chronic obstructive pulmonary disease, unspecified: Secondary | ICD-10-CM

## 2019-09-18 NOTE — Assessment & Plan Note (Addendum)
Ongoing with severe COPD, is maintaining weight at this time.  Recommend he continue protein shakes 3 times per day.  Educated on stages of COPD.

## 2019-09-18 NOTE — Assessment & Plan Note (Addendum)
Chronic, ongoing with O2 use daily.  Continue current medication regimen as prescribed by pulmonary and adjust as needed + collaboration with pulmonary, CCM, and palliative team.  Recommend complete cessation smoking.  Return to office in 3 months.

## 2019-09-18 NOTE — Assessment & Plan Note (Signed)
Chronic, ongoing, continues to be exacerbated by his severe COPD and recent grieving. Continue Sertraline 150 MG daily and continue Buspar 10 MG BID, he reports these work well.  He denies SI/HI.   Return in 3 months, sooner if worsening mood.  Would avoid benzo at this time, as concern for benzo and opioid combination use in this patient with severe COPD. 

## 2019-09-18 NOTE — Assessment & Plan Note (Addendum)
Chronic, ongoing, severe with O2 dependence. Continue current medication and collaboration with pulmonary, palliative, and CCM team.  Adjust regimen as needed.  Refill on Hycodan as needed, at this time not using.  Recommend complete cessation smoking. Return in 3 months.

## 2019-09-18 NOTE — Patient Instructions (Signed)
Eating Plan for Chronic Obstructive Pulmonary Disease Chronic obstructive pulmonary disease (COPD) causes symptoms such as shortness of breath, coughing, and chest discomfort. These symptoms can make it difficult to eat enough to maintain a healthy weight. Generally, people with COPD should eat a diet that is high in calories, protein, and other nutrients to maintain body weight and to keep the lungs as healthy as possible. Depending on the medicines you take and other health conditions you may have, your health care provider may give you additional recommendations on what to eat or avoid. Talk with your health care provider about your goals for body weight, and work with a dietitian to develop an eating plan that is right for you. What are tips for following this plan? Reading food labels   Avoid foods with more than 300 milligrams (mg) of salt (sodium) per serving.  Choose foods that contain at least 4 grams (g) of fiber per serving. Try to eat 20-30 g of fiber each day.  Choose foods that are high in calories and protein, such as nuts, beans, yogurt, and cheese. Shopping  Do not buy foods labeled as diet, low-calorie, or low-fat.  If you are able to eat dairy products: ? Avoid low-fat or skim milk. ? Buy dairy products that have at least 2% fat.  Buy nutritional supplement drinks.  Buy grains and prepared foods labeled as enriched or fortified.  Consider buying low-sodium, pre-made foods to conserve energy for eating. Cooking  Add dry milk or protein powder to smoothies.  Cook with healthy fats, such as olive oil, canola oil, sunflower oil, and grapeseed oil.  Add oil, butter, cream cheese, or nut butters to foods to increase fat and calories.  To make foods easier to chew and swallow: ? Cook vegetables, pasta, and rice until soft. ? Cut or grind meat into very small pieces. ? Dip breads in liquid. Meal planning   Eat when you feel hungry.  Eat 5-6 small meals throughout  the day.  Drink 6-8 glasses of water each day.  Do not drink liquids with meals. Drink liquids at the end of the meal to avoid feeling full too quickly.  Eat a variety of fruits and vegetables every day.  Ask for assistance from family or friends with planning and preparing meals as needed.  Avoid foods that cause you to feel bloated, such as carbonated drinks, fried foods, beans, broccoli, cabbage, and apples.  For older adults, ask your local agency on aging whether you are eligible for meal assistance programs, such as Meals on Wheels. Lifestyle   Do not smoke.  Eat slowly. Take small bites and chew food well before swallowing.  Do not overeat. This may make it more difficult to breathe after eating.  Sit up while eating.  If needed, continue to use supplemental oxygen while eating.  Rest or relax for 30 minutes before and after eating.  Monitor your weight as told by your health care provider.  Exercise as told by your health care provider. What foods can I eat? Fruits All fresh, dried, canned, or frozen fruits that do not cause gas. Vegetables All fresh, canned (no salt added), or frozen vegetables that do not cause gas. Grains Whole grain bread. Enriched whole grain pasta. Fortified whole grain cereals. Fortified rice. Quinoa. Meats and other proteins Lean meat. Poultry. Fish. Dried beans. Unsalted nuts. Tofu. Eggs. Nut butters. Dairy Whole or 2% milk. Cheese. Yogurt. Fats and oils Olive oil. Canola oil. Butter. Margarine. Beverages Water. Vegetable   juice (no salt added). Decaffeinated coffee. Decaffeinated or herbal tea. Seasonings and condiments Fresh or dried herbs. Low-salt or salt-free seasonings. Low-sodium soy sauce. The items listed above may not be a complete list of foods and beverages you can eat. Contact a dietitian for more information. What foods are not recommended? Fruits Fruits that cause gas, such as apples or melon. Vegetables Vegetables  that cause gas, such as broccoli, Brussels sprouts, cabbage, cauliflower, and onions. Canned vegetables with added salt. Meats and other proteins Fried meat. Salt-cured meat. Processed meat. Dairy Fat-free or low-fat milk, yogurt, or cheese. Processed cheese. Beverages Carbonated drinks. Caffeinated drinks, such as coffee, tea, and soft drinks. Juice. Alcohol. Vegetable juice with added salt. Seasonings and condiments Salt. Seasoning mixes with salt. Soy sauce. Pickles. Other foods Clear soup or broth. Fried foods. Prepared frozen meals. The items listed above may not be a complete list of foods and beverages you should avoid. Contact a dietitian for more information. Summary  COPD symptoms can make it difficult to eat enough to maintain a healthy weight.  A COPD eating plan can help you maintain your body weight and keep your lungs as healthy as possible.  Eat a diet that is high in calories, protein, and other nutrients. Read labels to make sure that you are getting the right nutrients. Cook foods to make them easy to chew and swallow.  Eat 5-6 small meals throughout the day, and avoid foods that cause gas or make you feel bloated. This information is not intended to replace advice given to you by your health care provider. Make sure you discuss any questions you have with your health care provider. Document Revised: 08/25/2018 Document Reviewed: 07/20/2017 Elsevier Patient Education  2020 Elsevier Inc.  

## 2019-09-18 NOTE — Progress Notes (Addendum)
BP 129/79   Pulse 73   Temp 97.9 F (36.6 C) (Oral)   Wt 123 lb 6.4 oz (56 kg)   SpO2 96%   BMI 18.76 kg/m    Subjective:    Patient ID: Austin Blevins, male    DOB: [birth year], 59 y.o.   MRN: [id number]  HPI: Austin Blevins is a 59 y.o. male  Chief Complaint  Patient presents with  . COPD  . Depression  . Pain   COPD Seen by pulmonary last 07/17/2019.Continues on Spiriva, Advair, PRN Albuterol, and Singulair. Is O2 dependent. Pulmonary prescribed Hycodan for chronic cough, but reports has not needed recently. Does continue to smoke about 1 PPD. COPD status:stable Satisfied with current treatment?:yes Oxygen use:yes Dyspnea frequency:baseline Cough frequency:baseline Rescue inhaler frequency:2 times a day Limitation of activity:no Productive cough: none Last Spirometry:with pulmonary Pneumovax:Up to Date Influenza:Up to Date  DEPRESSION Continues on Sertraline 150 MG daily and Buspar 10 MG BID. Recently found out his mother has cancer. Mood status:stable Satisfied with current treatment?:yes Symptom severity:moderate Duration of current treatment :chronic Side effects:no Medication compliance:good compliance Psychotherapy/counseling:none Previous psychiatric medications:Duloxetine and Sertraline Depressed mood:occasional Anxious mood:no Anhedonia:no Significant weight loss or gain:no Insomnia:yeshard to fall asleep Fatigue:no Feelings of worthlessness or guilt:no Impaired concentration/indecisiveness:yes Suicidal ideations:no Hopelessness:no Crying spells:no Depression screen [facility] 2/9 09/18/2019 07/03/2019 05/30/2019 04/28/2019 03/24/2019  Decreased Interest 3 3 3 3 3   Down, Depressed, Hopeless 2 2 2 2 2   PHQ - 2 Score 5 5 5 5 5   Altered sleeping 2 3 3 3 3   Tired, decreased energy 2 3 3 3 3   Change in appetite 2 1 3 2 1   Feeling bad or failure about yourself  1 2 2 1 1   Trouble concentrating 1 2 2 1 3   Moving slowly or  fidgety/restless 1 1 1 1 2   Suicidal thoughts 0 1 1 1 1   PHQ-9 Score 14 18 20 17 19   Difficult doing work/chores Somewhat difficult Extremely dIfficult Somewhat difficult Very difficult Somewhat difficult  Some recent data might be hidden   BACK PAIN: Currently being followed by PA-C and Dr. with neurosurgery due to closed fracture of seventh thoracic vertebra with routine healing.  Diagnosed with a mild T8 compression fracture from CT angio chest on 07/20/2019 while he was being evaluated for shortness of breath. He was provided a TLSO brace, but has not been wearing except for brief periods prior to bed.  He was given refill on his Oxycodone at recent visit with them on 08/22/2019, as this was offering benefit on discharge from hospital, and is to follow-up in one month with them.  Last fill on Oxycodone was on 09/15/19 for #40 pills.    Relevant past medical, surgical, family and social history reviewed and updated as indicated. Interim medical history since our last visit reviewed. Allergies and medications reviewed and updated.  Review of Systems  Constitutional: Negative for activity change, diaphoresis, fatigue and fever.  Respiratory: Positive for cough (baseline), shortness of breath (baseline) and wheezing (baseline). Negative for chest tightness.   Cardiovascular: Negative for chest pain, palpitations and leg swelling.  Gastrointestinal: Negative.   Skin: Negative.   Neurological: Negative.   Psychiatric/Behavioral: Negative.     Per HPI unless specifically indicated above     Objective:    BP 129/79   Pulse 73   Temp 97.9 F (36.6 C) (Oral)   Wt 123 lb 6.4 oz (56 kg)   SpO2 96%  BMI 18.76 kg/m   Wt Readings from Last 3 Encounters:  09/18/19 123 lb 6.4 oz (56 kg)  08/02/19 126 lb 12.8 oz (57.5 kg)  03/24/19 120 lb (54.4 kg)    Physical Exam Vitals and nursing note reviewed.  Constitutional:      General: He is awake. He is not in acute distress.     Appearance: He is well-developed, well-groomed and underweight. He is not ill-appearing.     Comments: Frail appearing.  HENT:     Head: Normocephalic and atraumatic.     Right Ear: Hearing normal. No drainage.     Left Ear: Hearing normal. No drainage.  Eyes:     General: Lids are normal.        Right eye: No discharge.        Left eye: No discharge.     Conjunctiva/sclera: Conjunctivae normal.     Pupils: Pupils are equal, round, and reactive to light.  Neck:     Thyroid: No thyromegaly.     Vascular: No carotid bruit.  Cardiovascular:     Rate and Rhythm: Normal rate and regular rhythm.     Heart sounds: Normal heart sounds, S1 normal and S2 normal. No murmur. No gallop.   Pulmonary:     Effort: Pulmonary effort is normal. No accessory muscle usage or respiratory distress.     Breath sounds: Decreased breath sounds and wheezing present.     Comments: Diminished breath sounds throughout with occasional expiratory wheezes, no rhonchi.  O2 by [place] on at 2 L. Abdominal:     General: Bowel sounds are normal.     Palpations: Abdomen is soft. There is no hepatomegaly or splenomegaly.     Tenderness: There is no abdominal tenderness.  Musculoskeletal:        General: Normal range of motion.     Cervical back: Normal range of motion and neck supple.     Right lower leg: No edema.     Left lower leg: No edema.  Skin:    General: Skin is warm and dry.     Comments: Mild clubbing bilateral fingernails.    Neurological:     Mental Status: He is alert and oriented to person, place, and time.     Deep Tendon Reflexes: Reflexes are normal and symmetric.     Reflex Scores:      Brachioradialis reflexes are 2+ on the right side and 2+ on the left side.      Patellar reflexes are 2+ on the right side and 2+ on the left side. Psychiatric:        Attention and Perception: Attention normal.        Mood and Affect: Mood normal.        Speech: Speech normal.        Behavior: Behavior normal.  Behavior is cooperative.        Thought Content: Thought content normal.     Results for orders placed or performed in visit on 09/13/19  Comprehensive metabolic panel  Result Value Ref Range   Glucose 113 (H) 65 - 99 mg/dL   BUN 15 6 - 24 mg/dL   Creatinine, Ser 1.15 0.76 - 1.27 mg/dL   GFR calc non Af Amer 70 >59 mL/min/1.73   GFR calc Af Amer 81 >59 mL/min/1.73   BUN/Creatinine Ratio 13 9 - 20   Sodium 144 134 - 144 mmol/L   Potassium 4.4 3.5 - 5.2 mmol/L   Chloride 103 96 -  106 mmol/L   CO2 27 20 - 29 mmol/L   Calcium 9.5 8.7 - 10.2 mg/dL   Total Protein 6.2 6.0 - 8.5 g/dL   Albumin 4.0 3.8 - 4.9 g/dL   Globulin, Total 2.2 1.5 - 4.5 g/dL   Albumin/Globulin Ratio 1.8 1.2 - 2.2   Bilirubin Total <0.2 0.0 - 1.2 mg/dL   Alkaline Phosphatase 98 39 - 117 IU/L   AST 11 0 - 40 IU/L   ALT 11 0 - 44 IU/L  CBC with Differential/Platelet  Result Value Ref Range   WBC 10.6 3.4 - 10.8 x10E3/uL   RBC 4.75 4.14 - 5.80 x10E6/uL   Hemoglobin 15.3 13.0 - 17.7 g/dL   Hematocrit 16.1 09.6 - 51.0 %   MCV 94 79 - 97 fL   MCH 32.2 26.6 - 33.0 pg   MCHC 34.5 31.5 - 35.7 g/dL   RDW 04.5 40.9 - 81.1 %   Platelets 326 150 - 450 x10E3/uL   Neutrophils 84 Not Estab. %   Lymphs 8 Not Estab. %   Monocytes 5 Not Estab. %   Eos 1 Not Estab. %   Basos 1 Not Estab. %   Neutrophils Absolute 8.9 (H) 1.4 - 7.0 x10E3/uL   Lymphocytes Absolute 0.8 0.7 - 3.1 x10E3/uL   Monocytes Absolute 0.5 0.1 - 0.9 x10E3/uL   EOS (ABSOLUTE) 0.1 0.0 - 0.4 x10E3/uL   Basophils Absolute 0.1 0.0 - 0.2 x10E3/uL   Immature Granulocytes 1 Not Estab. %   Immature Grans (Abs) 0.1 0.0 - 0.1 x10E3/uL      Assessment & Plan:   Problem List Items Addressed This Visit      Respiratory   Chronic respiratory failure with hypoxia (HCC)    Chronic, ongoing with O2 use daily.  Continue current medication regimen as prescribed by pulmonary and adjust as needed + collaboration with pulmonary, CCM, and palliative team.  Recommend  complete cessation smoking.  Return to office in 3 months.      Stage 3 severe COPD by GOLD classification (HCC) - Primary    Chronic, ongoing, severe with O2 dependence. Continue current medication and collaboration with pulmonary, palliative, and CCM team.  Adjust regimen as needed.  Refill on Hycodan as needed, at this time not using.  Recommend complete cessation smoking. Return in 3 months.        Nervous and Auditory   Nicotine dependence, cigarettes, w unsp disorders    I have recommended complete cessation of tobacco use. I have discussed various options available for assistance with tobacco cessation including over the counter methods (Nicotine gum, patch and lozenges). We also discussed prescription options (Chantix, Nicotine Inhaler / Nasal Spray). The patient is not interested in pursuing any prescription tobacco cessation options at this time.         Other   Protein-calorie malnutrition (HCC)    Ongoing with severe COPD, is maintaining weight at this time.  Recommend he continue protein shakes 3 times per day.  Educated on stages of COPD.      Depression, major, recurrent, moderate (HCC)    Chronic, ongoing, continues to be exacerbated by his severe COPD and recent grieving. Continue Sertraline 150 MG daily and continue Buspar 10 MG BID, he reports these work well.  He denies SI/HI.   Return in 3 months, sooner if worsening mood.  Would avoid benzo at this time, as concern for benzo and opioid combination use in this patient with severe COPD.  Low back pain    Ongoing pain present, especially with recent closed fracture of seventh thoracic vertebra with current routine healing.  Pain well controlled on current regimen as prescribed by neurosurgery.  Discussed at length with him need to use Oxycodone only sparsely for severe pain and that this would be beneficial for short term use only during healing.  Recommend he continue follow-up with neurosurgery and will review notes  when available.  Continue use of heat as needed at home + Icy/Hot gel or lidocaine patches.  Return to office for worsening or ongoing symptoms.          Follow up plan: Return in about 3 months (around 12/19/2019) for COPD, MOOD, GERD.

## 2019-09-18 NOTE — Assessment & Plan Note (Signed)
Ongoing pain present, especially with recent closed fracture of seventh thoracic vertebra with current routine healing.  Pain well controlled on current regimen as prescribed by neurosurgery.  Discussed at length with him need to use Oxycodone only sparsely for severe pain and that this would be beneficial for short term use only during healing.  Recommend he continue follow-up with neurosurgery and will review notes when available.  Continue use of heat as needed at home + Icy/Hot gel or lidocaine patches.  Return to office for worsening or ongoing symptoms.

## 2019-09-18 NOTE — Assessment & Plan Note (Signed)
I have recommended complete cessation of tobacco use. I have discussed various options available for assistance with tobacco cessation including over the counter methods (Nicotine gum, patch and lozenges). We also discussed prescription options (Chantix, Nicotine Inhaler / Nasal Spray). The patient is not interested in pursuing any prescription tobacco cessation options at this time.  

## 2019-09-22 ENCOUNTER — Other Ambulatory Visit: Payer: Self-pay | Admitting: Nurse Practitioner

## 2019-09-26 ENCOUNTER — Ambulatory Visit (INDEPENDENT_AMBULATORY_CARE_PROVIDER_SITE_OTHER): Payer: Medicare Other | Admitting: General Practice

## 2019-09-26 ENCOUNTER — Telehealth: Payer: Medicare Other | Admitting: General Practice

## 2019-09-26 DIAGNOSIS — F331 Major depressive disorder, recurrent, moderate: Secondary | ICD-10-CM

## 2019-09-26 DIAGNOSIS — F419 Anxiety disorder, unspecified: Secondary | ICD-10-CM

## 2019-09-26 DIAGNOSIS — F17219 Nicotine dependence, cigarettes, with unspecified nicotine-induced disorders: Secondary | ICD-10-CM

## 2019-09-26 DIAGNOSIS — F4321 Adjustment disorder with depressed mood: Secondary | ICD-10-CM

## 2019-09-26 DIAGNOSIS — M545 Low back pain, unspecified: Secondary | ICD-10-CM

## 2019-09-26 DIAGNOSIS — J449 Chronic obstructive pulmonary disease, unspecified: Secondary | ICD-10-CM

## 2019-09-26 NOTE — Patient Instructions (Signed)
Visit Information  Goals Addressed            This Visit's Progress   . RN- COPD/Depression/Chronic Pancreatitis Management (pt-stated)       Current Barriers:  [name] Kitchen Knowledge deficits related to basic COPD self care/management . Knowledge deficit related to Depression and Chronic pancreatitis . Knowledge deficit related to importance of energy conservation  . Financial Barriers   Case Manager Clinical Goal(s):  Over the next 120 days patient will report using inhalers as prescribed including rinsing mouth after use  Over the next 120 days patient will report utilizing pursed lip breathing for shortness of breath  Over the next 120 days, patient will be able to verbalize understanding of COPD action plan and when to seek appropriate levels of medical care  Over the next 120 days, patient will engage in lite exercise as tolerated to build/regain stamina and strength and reduce shortness of breath through activity tolerance  Over the next 120 days, patient will verbalize basic understanding of COPD disease process and self care activities  Over the next 90 days the patient will work with pulmonary provider to get a new nebulizer machine as the one he is using is one he got at a yard sale- completed  Over the next 90 days the patient with work with the oxygen company to get needed oxygen tanks. The patient now has an [employer] machine and was told that [employer], would not be supplier anymore. The patient is trying to confirm the information he needs so he will not be without his oxygen.  Over the next 120 days, the patient will work with the [facility] for effective management of depression and chronic pancreatitis   Interventions:   Provided patient with COPD action plan and reinforced importance of daily self assessment  Discussed Pulmonary Rehab and offered to assist with referral placement  Advised patient to engage in light exercise as tolerated 3-5 days a week- patient  easily gets shob when ambulating.  The patient is very limited in ability to do activity. Spoke to pcp about this at last appointment last week. The patient verbalized he feels this is a part of his COPD progression.   Discussed with patient copd symptoms, the patient verbalized he is doing well physically but mentally he is having a hard time. His elderly mother was just told she has cancer and had a PET scan to determine if the cancer has spread.   Messaging sent to Dr. Meredeth Ide asking for an order for a new nebulizer machine -Patient stated he bought his nebulizer at a yard sale and it is older. Made him aware we could work towards getting him a new nebulizer machine. Done  Patient reports his new medicare benefit has started, he is no longer solely Medicaid.   Supplied the patient with Vanilla ensure and coupons at today's visit - completed   Evaluation of the patients current condition: Incoming call from the patient's sister [name].  [name] expressed that she is concerned about the patient as his oxygen levels are in the 70's and they come up some but not a lot. The patient is in bed and not getting up. She feels the patient has pneumonia. The patient does not want to go to the hospital because he fears they will "intubate" him. Education on the need to be evaluated at the hospital and that he could express his desire not to be intubated. The patients sister is really concerned and wanted to best know how  to help the patient. Secure chat messaging with the pcp and CCM team. All agree the patient should be evaluated at the hospital. Message relayed to the patients sister [name]. Talked with the patients sister about tough decisions of what the patients wishes are and discussed the options of pallative/hospice care. The patient's sister is concerned and will try to talk the patient into going to the hospital.  The patient does continue to smoke. Empathetic listening and support given to the patients  sister. The patients sister will update the RNCM of any changes. Completed.  The patient is back to his baseline status.   Pharmacy referral for help with cough medicine and cost constraints.   LCSW referral for help with grief over recent news of his mother having cancer. PET scan today. The patient has a strong faith in God and says he has put it in his hands.   Review of smoking- the patient continues to smoke and is not interested in smoking cessation at this time.   Patient Self Care Activities:  Takes medications as prescribed including inhalers  Practices and uses pursed lip breathing for shortness of breath recovery and prevention  Self assesses COPD action plan zone and makes appointment with provider if in the yellow zone for 48 hours without improvement.  Engages in light exercise 3-5 days a week  Utilizes infection prevention strategies to reduce risk of respiratory infection   Please see past updates related to this goal by clicking on the "Past Updates" button in the selected goal      . RNCM: Pt- "They lost my dentures at the hospital" (pt-stated)       CARE PLAN ENTRY (see longitudinal plan of care for additional care plan information)  Current Barriers:  [name] Kitchen Knowledge Deficits related to losing dentures and the request for help in calling the hospital to find out about his dentures.  Darylene Price caregiver support.  . Corporate treasurer.   Nurse Case Manager Clinical Goal(s):  [name] Kitchen Over the next 120 days, patient will verbalize understanding of plan for getting new dentures to replace lost dentures . Over the next 120 days, patient will work with [facility] and pcp to address needs related to referral to dentist to get set up for an appointment to get new dentures.   Interventions:  . Inter-disciplinary care team collaboration (see longitudinal plan of care) . Evaluation of current treatment plan related to replacing lost dentures and patient's adherence to plan as  established by provider. The patient is going to the dentist on 5/13 to see what they can do about replacing his dentures . Advised patient to call the office if he does not hear from the dentist.  Appointment is in place . Provided education to patient re: referral to dentistry done . Collaborated with pcp and RNCM regarding referral for dentistry . Reviewed scheduled/upcoming provider appointments including: appointment is on 09-28-2019.  The patient will let the RNCM know how the appointment goes.   Patient Self Care Activities:  . Patient verbalizes understanding of plan to see dentist for new dentures . Attends all scheduled provider appointments . Calls provider office for new concerns or questions . Unable to independently determine how to get new dentures after losing his dentures during recent hospitalization.  Please see past updates related to this goal by clicking on the "Past Updates" button in the selected goal      . RNCM: Pt-"I need help with controlling this pain" (pt-stated)  CARE PLAN ENTRY (see longtitudinal plan of care for additional care plan information)  Current Barriers:  [name] Kitchen Knowledge Deficits related to pain control in a patient with complex medical conditions with Chronic pain that is getting worse per the patient  . Lacks caregiver support.  . Corporate treasurer.  . Anxiety attacks related to pain   Nurse Case Manager Clinical Goal(s):  [name] Kitchen Over the next 120 days, patient will verbalize understanding of plan for managing chronic pain and discomfort  . Over the next 120  days, patient will work with [facility], CCM team, and pcp  to address needs related to worsening Chronic pain and discomfort and the patient verbalizing he needs assistance with a pain specialist/clinic referral or other resources to help in managing his pain and discomfort . Over the next 90 days, patient will demonstrate a decrease in pain  exacerbations as evidenced by stated decrease in pain  level and discomfort   Interventions:  . Evaluation of current treatment plan related to pain management  and patient's adherence to plan as established by provider. The patient is seeing the neurosurgeon and his pain is well managed now. The neurosurgeon is referring him to a pain specialist to continue managing his pain and discomfort. He saw the neurosurgeon today.  . Provided education to patient re: Following recommendations of the provider and calling for changes in level or intensity of pain . Reviewed medications with patient and discussed compliance.  The patient verbalized that the cough medication works well but he can not afford it. Will consult with pharmacist for assistance and recommendations.  [name] Kitchen Collaborated with pcp and  CCM pharmacist regarding uncontrolled chronic pain and the patients request for a referral to a pain specialist/clinic or other recommendations for helping with pain control.  The patient was hospitalized and the etiology of pain found. The patient has adequate pain medications now. -Completed  . Called Dr. Reita Cliche office and spoke to the staff. The staff is faxing over the xray results, lab results, and the office visit note from recent visit for the pcp.  The staff is also sending a message to have Dr. Reita Cliche nurse call the [facility] to discuss and collaborate care for the patient. Done  . Advised front office staff to watch for fax from Dr. Reita Cliche office and let Jolene know when it was available.  Done . Spoke to the patient and advised the patient to call Dr. Reita Cliche office and let them know about the changes he is experiencing. Also advised the patient for worsening shob or unrelieved pain to go to the ED to be evaluated. The patient verbalized understanding. Done . Received a call back from [facility] the nurse with Dr. Meredeth Ide.  Morrie Sheldon reported that Dr. Meredeth Ide could not find any acute findings that would be causing the increased pain and discomfort. He discussed  with the patient a referral back to the pcp office for a referral for pain specialist evaluation for possibly shots to help with pain. Also she said Dr. Meredeth Ide feels a psychiatric referral would be beneficial for the patient.  Dr. Meredeth Ide did prescribe Buspar for the patient to take. Done  . Received fax from Dr. Reita Cliche office. Staff has placed in Jolene's book for review. Done . Review of upcoming appointments. Per the patient he will follow up next month with the neurosurgeon and she will work on getting him in to see the the pain specialist.    Patient Self Care Activities:  . Patient verbalizes understanding of plan  to discuss pain control options with the interdisciplinary team for recommendations and resources  . Self administers medications as prescribed . Attends all scheduled provider appointments . Performs IADL's independently . Calls provider office for new concerns or questions . Unable to independently control chronic pain and discomfort  . Lacks social connections . Unable to perform IADLs independently  Please see past updates related to this goal by clicking on the "Past Updates" button in the selected goal         Patient verbalizes understanding of instructions provided today.   The care management team will reach out to the patient again over the next 60 to 90 days.   Noreene Larsson RN, MSN, [facility] Mobile: [phone]

## 2019-09-26 NOTE — Chronic Care Management (AMB) (Signed)
Chronic Care Management   Follow Up Note   09/26/2019 Name: Austin Blevins MRN: [id number] DOB: May 08, 1961  Referred by: [name], NP Reason for referral : Chronic Care Management (follow up on COPD/Depression/Chronic pain and new concerns)   Austin Blevins is a 59 y.o. year old male who is a primary care patient of [name], [name], NP. The CCM team was consulted for assistance with chronic disease management and care coordination needs.    Review of patient status, including review of consultants reports, relevant laboratory and other test results, and collaboration with appropriate care team members and the patient's provider was performed as part of comprehensive patient evaluation and provision of chronic care management services.    SDOH (Social Determinants of Health) assessments performed: Yes- financial constraints with affording cough medication  See Care Plan activities for detailed interventions related to [facility])     Outpatient Encounter Medications as of 09/26/2019  Medication Sig Note  . albuterol (PROVENTIL) (2.5 MG/3ML) 0.083% nebulizer solution USE ONE VIAL IN NEBULIZER EVERY SIX HOURS AS NEEDED FOR WHEEZING OR SHORTNESS OF BREATH   . albuterol (VENTOLIN HFA) 108 (90 Base) MCG/ACT inhaler Inhale 2 puffs into the lungs every 4 (four) hours as needed for wheezing or shortness of breath.   . busPIRone (BUSPAR) 10 MG tablet TAKE ONE TABLET TWICE DAILY   . Fluticasone-Salmeterol (ADVAIR DISKUS) 250-50 MCG/DOSE AEPB Inhale 1 puff into the lungs 2 (two) times daily.   [name] Kitchen HYDROcodone-homatropine (HYCODAN) 5-1.5 MG/5ML syrup Take 5 mLs by mouth every 8 (eight) hours as needed. (Patient not taking: Reported on 09/18/2019)   . metoprolol tartrate (LOPRESSOR) 25 MG tablet Take 0.5 tablets (12.5 mg total) by mouth 2 (two) times daily.   . montelukast (SINGULAIR) 10 MG tablet TAKE ONE TABLET AT BEDTIME   . omeprazole (PRILOSEC) 20 MG capsule TAKE 1 CAPSULE BY MOUTH EVERY DAY   .  oxyCODONE (OXY IR/ROXICODONE) 5 MG immediate release tablet Take 5 mg by mouth every 6 (six) hours as needed.   . predniSONE (DELTASONE) 10 MG tablet Take 10 mg by mouth daily with breakfast.   . rOPINIRole (REQUIP) 0.5 MG tablet TAKE ONE TABLET EVERY MORNING AND TAKE THREE TABLETS AT BEDTIME AS NEEDED FOR RESTLESS LEG SYNDROME   . sertraline (ZOLOFT) 100 MG tablet Take 1.5 tablets (150 mg total) by mouth daily.   [name] Kitchen SPIRIVA HANDIHALER 18 MCG inhalation capsule INHALE CONTENTS OF 1 CAPSULE AS DIRECTEDEVERY DAY WITH HANDIHALER   . [DISCONTINUED] albuterol (PROVENTIL HFA;VENTOLIN HFA) 108 (90 Base) MCG/ACT inhaler Inhale 2 puffs into the lungs every 4 (four) hours as needed for wheezing or shortness of breath. 12/13/2018: Using 4x day  . [DISCONTINUED] albuterol (VENTOLIN HFA) 108 (90 Base) MCG/ACT inhaler INHALE 2 PUFFS INTO THE LUNGS EVERY 4 HOURS AS NEEDED FOR WHEEZING OR SHORTNESS OF BREATH   . [DISCONTINUED] rOPINIRole (REQUIP) 0.5 MG tablet TAKE ONE TABLET BY MOUTH EVERY MORNING AND 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    No facility-administered encounter medications on file as of 09/26/2019.     Objective:  BP Readings from Last 3 Encounters:  09/18/19 129/79  08/07/19 91/60  08/02/19 129/82    Goals Addressed            This Visit's Progress   . RN- COPD/Depression/Chronic Pancreatitis Management (pt-stated)       Current Barriers:  [name] Kitchen Knowledge deficits related to basic COPD self care/management . Knowledge deficit related to Depression and Chronic  pancreatitis . Knowledge deficit related to importance of energy conservation  . Financial Barriers   Case Manager Clinical Goal(s):  Over the next 120 days patient will report using inhalers as prescribed including rinsing mouth after use  Over the next 120 days patient will report utilizing pursed lip breathing for shortness of breath  Over the next 120 days, patient will be able to verbalize understanding of  COPD action plan and when to seek appropriate levels of medical care  Over the next 120 days, patient will engage in lite exercise as tolerated to build/regain stamina and strength and reduce shortness of breath through activity tolerance  Over the next 120 days, patient will verbalize basic understanding of COPD disease process and self care activities  Over the next 90 days the patient will work with pulmonary provider to get a new nebulizer machine as the one he is using is one he got at a yard sale- completed  Over the next 90 days the patient with work with the oxygen company to get needed oxygen tanks. The patient now has an [employer] machine and was told that [employer], would not be supplier anymore. The patient is trying to confirm the information he needs so he will not be without his oxygen.  Over the next 120 days, the patient will work with the [facility] for effective management of depression and chronic pancreatitis   Interventions:   Provided patient with COPD action plan and reinforced importance of daily self assessment  Discussed Pulmonary Rehab and offered to assist with referral placement  Advised patient to engage in light exercise as tolerated 3-5 days a week- patient easily gets shob when ambulating.  The patient is very limited in ability to do activity. Spoke to pcp about this at last appointment last week. The patient verbalized he feels this is a part of his COPD progression.   Discussed with patient copd symptoms, the patient verbalized he is doing well physically but mentally he is having a hard time. His elderly mother was just told she has cancer and had a PET scan to determine if the cancer has spread.   Messaging sent to Dr. Meredeth Ide asking for an order for a new nebulizer machine -Patient stated he bought his nebulizer at a yard sale and it is older. Made him aware we could work towards getting him a new nebulizer machine. Done  Patient reports his new  medicare benefit has started, he is no longer solely Medicaid.   Supplied the patient with Vanilla ensure and coupons at today's visit - completed   Evaluation of the patients current condition: Incoming call from the patient's sister [name].  [name] expressed that she is concerned about the patient as his oxygen levels are in the 70's and they come up some but not a lot. The patient is in bed and not getting up. She feels the patient has pneumonia. The patient does not want to go to the hospital because he fears they will "intubate" him. Education on the need to be evaluated at the hospital and that he could express his desire not to be intubated. The patients sister is really concerned and wanted to best know how to help the patient. Secure chat messaging with the pcp and CCM team. All agree the patient should be evaluated at the hospital. Message relayed to the patients sister [name]. Talked with the patients sister about tough decisions of what the patients wishes are and discussed the options of pallative/hospice care. The  patient's sister is concerned and will try to talk the patient into going to the hospital.  The patient does continue to smoke. Empathetic listening and support given to the patients sister. The patients sister will update the RNCM of any changes. Completed.  The patient is back to his baseline status.   Pharmacy referral for help with cough medicine and cost constraints.   LCSW referral for help with grief over recent news of his mother having cancer. PET scan today. The patient has a strong faith in God and says he has put it in his hands.   Review of smoking- the patient continues to smoke and is not interested in smoking cessation at this time.   Patient Self Care Activities:  Takes medications as prescribed including inhalers  Practices and uses pursed lip breathing for shortness of breath recovery and prevention  Self assesses COPD action plan zone and makes appointment  with provider if in the yellow zone for 48 hours without improvement.  Engages in light exercise 3-5 days a week  Utilizes infection prevention strategies to reduce risk of respiratory infection   Please see past updates related to this goal by clicking on the "Past Updates" button in the selected goal      . RNCM: Pt- "They lost my dentures at the hospital" (pt-stated)       CARE PLAN ENTRY (see longitudinal plan of care for additional care plan information)  Current Barriers:  [name] Kitchen Knowledge Deficits related to losing dentures and the request for help in calling the hospital to find out about his dentures.  Darylene Price caregiver support.  . Corporate treasurer.   Nurse Case Manager Clinical Goal(s):  [name] Kitchen Over the next 120 days, patient will verbalize understanding of plan for getting new dentures to replace lost dentures . Over the next 120 days, patient will work with [facility] and pcp to address needs related to referral to dentist to get set up for an appointment to get new dentures.   Interventions:  . Inter-disciplinary care team collaboration (see longitudinal plan of care) . Evaluation of current treatment plan related to replacing lost dentures and patient's adherence to plan as established by provider. The patient is going to the dentist on 5/13 to see what they can do about replacing his dentures . Advised patient to call the office if he does not hear from the dentist.  Appointment is in place . Provided education to patient re: referral to dentistry done . Collaborated with pcp and RNCM regarding referral for dentistry . Reviewed scheduled/upcoming provider appointments including: appointment is on 09-28-2019.  The patient will let the RNCM know how the appointment goes.   Patient Self Care Activities:  . Patient verbalizes understanding of plan to see dentist for new dentures . Attends all scheduled provider appointments . Calls provider office for new concerns or  questions . Unable to independently determine how to get new dentures after losing his dentures during recent hospitalization.  Please see past updates related to this goal by clicking on the "Past Updates" button in the selected goal      . RNCM: Pt-"I need help with controlling this pain" (pt-stated)       CARE PLAN ENTRY (see longtitudinal plan of care for additional care plan information)  Current Barriers:  [name] Kitchen Knowledge Deficits related to pain control in a patient with complex medical conditions with Chronic pain that is getting worse per the patient  . Lacks caregiver support.  . Corporate treasurer.  [name] Kitchen  Anxiety attacks related to pain   Nurse Case Manager Clinical Goal(s):  [name] Kitchen Over the next 120 days, patient will verbalize understanding of plan for managing chronic pain and discomfort  . Over the next 120  days, patient will work with [facility], CCM team, and pcp  to address needs related to worsening Chronic pain and discomfort and the patient verbalizing he needs assistance with a pain specialist/clinic referral or other resources to help in managing his pain and discomfort . Over the next 90 days, patient will demonstrate a decrease in pain  exacerbations as evidenced by stated decrease in pain level and discomfort   Interventions:  . Evaluation of current treatment plan related to pain management  and patient's adherence to plan as established by provider. The patient is seeing the neurosurgeon and his pain is well managed now. The neurosurgeon is referring him to a pain specialist to continue managing his pain and discomfort. He saw the neurosurgeon today.  . Provided education to patient re: Following recommendations of the provider and calling for changes in level or intensity of pain . Reviewed medications with patient and discussed compliance.  The patient verbalized that the cough medication works well but he can not afford it. Will consult with pharmacist for assistance and  recommendations.  [name] Kitchen Collaborated with pcp and  CCM pharmacist regarding uncontrolled chronic pain and the patients request for a referral to a pain specialist/clinic or other recommendations for helping with pain control.  The patient was hospitalized and the etiology of pain found. The patient has adequate pain medications now. -Completed  . Called Dr. Reita Cliche office and spoke to the staff. The staff is faxing over the xray results, lab results, and the office visit note from recent visit for the pcp.  The staff is also sending a message to have Dr. Reita Cliche nurse call the [facility] to discuss and collaborate care for the patient. Done  . Advised front office staff to watch for fax from Dr. Reita Cliche office and let Jolene know when it was available.  Done . Spoke to the patient and advised the patient to call Dr. Reita Cliche office and let them know about the changes he is experiencing. Also advised the patient for worsening shob or unrelieved pain to go to the ED to be evaluated. The patient verbalized understanding. Done . Received a call back from [facility] the nurse with Dr. Meredeth Ide.  Morrie Sheldon reported that Dr. Meredeth Ide could not find any acute findings that would be causing the increased pain and discomfort. He discussed with the patient a referral back to the pcp office for a referral for pain specialist evaluation for possibly shots to help with pain. Also she said Dr. Meredeth Ide feels a psychiatric referral would be beneficial for the patient.  Dr. Meredeth Ide did prescribe Buspar for the patient to take. Done  . Received fax from Dr. Reita Cliche office. Staff has placed in Jolene's book for review. Done . Review of upcoming appointments. Per the patient he will follow up next month with the neurosurgeon and she will work on getting him in to see the the pain specialist.    Patient Self Care Activities:  . Patient verbalizes understanding of plan to discuss pain control options with the interdisciplinary team for  recommendations and resources  . Self administers medications as prescribed . Attends all scheduled provider appointments . Performs IADL's independently . Calls provider office for new concerns or questions . Unable to independently control chronic pain and discomfort  . Lacks social  connections . Unable to perform IADLs independently  Please see past updates related to this goal by clicking on the "Past Updates" button in the selected goal          Plan:   The care management team will reach out to the patient again over the next 60 to 90 days.    Alto DenverPam Tate RN, MSN, CCM Community Care Coordinator [place]  [facility] Mobile: [phone]

## 2019-09-27 ENCOUNTER — Other Ambulatory Visit: Payer: Self-pay | Admitting: Nurse Practitioner

## 2019-09-27 ENCOUNTER — Ambulatory Visit: Payer: Self-pay | Admitting: Pharmacist

## 2019-09-27 DIAGNOSIS — J449 Chronic obstructive pulmonary disease, unspecified: Secondary | ICD-10-CM

## 2019-09-27 DIAGNOSIS — F4321 Adjustment disorder with depressed mood: Secondary | ICD-10-CM | POA: Diagnosis not present

## 2019-09-27 DIAGNOSIS — F331 Major depressive disorder, recurrent, moderate: Secondary | ICD-10-CM | POA: Diagnosis not present

## 2019-09-27 MED ORDER — HYDROCODONE-HOMATROPINE 5-1.5 MG/5ML PO SYRP: 5.0000 mL | ORAL_SOLUTION | Freq: Three times a day (TID) | ORAL | 0 refills | Status: DC | PRN

## 2019-09-27 NOTE — Chronic Care Management (AMB) (Signed)
Chronic Care Management   Follow Up Note   09/27/2019 Name: Austin Blevins MRN: [id number] DOB: 19-Nov-1960  Referred by: Austin Lick, NP Reason for referral : Chronic Care Management (Medication Management)   Austin Blevins is a 59 y.o. year old male who is a primary care patient of [name], Austin Faster, NP. The CCM team was consulted for assistance with chronic disease management and care coordination needs.    Received message from RN CM regarding medication cost concerns.   Review of patient status, including review of consultants reports, relevant laboratory and other test results, and collaboration with appropriate care team members and the patient's provider was performed as part of comprehensive patient evaluation and provision of chronic care management services.    SDOH (Social Determinants of Health) assessments performed: Yes See Care Plan activities for detailed interventions related to [name])     Outpatient Encounter Medications as of 09/27/2019  Medication Sig Note  . albuterol (PROVENTIL) (2.5 MG/3ML) 0.083% nebulizer solution USE ONE VIAL IN NEBULIZER EVERY SIX HOURS AS NEEDED FOR WHEEZING OR SHORTNESS OF BREATH   . albuterol (VENTOLIN HFA) 108 (90 Base) MCG/ACT inhaler Inhale 2 puffs into the lungs every 4 (four) hours as needed for wheezing or shortness of breath.   . busPIRone (BUSPAR) 10 MG tablet TAKE ONE TABLET TWICE DAILY   . Fluticasone-Salmeterol (ADVAIR DISKUS) 250-50 MCG/DOSE AEPB Inhale 1 puff into the lungs 2 (two) times daily.   [name] Kitchen HYDROcodone-homatropine (HYCODAN) 5-1.5 MG/5ML syrup Take 5 mLs by mouth every 8 (eight) hours as needed. (Patient not taking: Reported on 09/18/2019)   . metoprolol tartrate (LOPRESSOR) 25 MG tablet Take 0.5 tablets (12.5 mg total) by mouth 2 (two) times daily.   . montelukast (SINGULAIR) 10 MG tablet TAKE ONE TABLET AT BEDTIME   . omeprazole (PRILOSEC) 20 MG capsule TAKE 1 CAPSULE BY MOUTH EVERY DAY   . oxyCODONE (OXY  IR/ROXICODONE) 5 MG immediate release tablet Take 5 mg by mouth every 6 (six) hours as needed.   . predniSONE (DELTASONE) 10 MG tablet Take 10 mg by mouth daily with breakfast.   . rOPINIRole (REQUIP) 0.5 MG tablet TAKE ONE TABLET EVERY MORNING AND TAKE THREE TABLETS AT BEDTIME AS NEEDED FOR RESTLESS LEG SYNDROME   . sertraline (ZOLOFT) 100 MG tablet Take 1.5 tablets (150 mg total) by mouth daily.   [name] Kitchen SPIRIVA HANDIHALER 18 MCG inhalation capsule INHALE CONTENTS OF 1 CAPSULE AS DIRECTEDEVERY DAY WITH HANDIHALER   . [DISCONTINUED] albuterol (PROVENTIL HFA;VENTOLIN HFA) 108 (90 Base) MCG/ACT inhaler Inhale 2 puffs into the lungs every 4 (four) hours as needed for wheezing or shortness of breath. 12/13/2018: Using 4x day  . [DISCONTINUED] albuterol (VENTOLIN HFA) 108 (90 Base) MCG/ACT inhaler INHALE 2 PUFFS INTO THE LUNGS EVERY 4 HOURS AS NEEDED FOR WHEEZING OR SHORTNESS OF BREATH   . [DISCONTINUED] rOPINIRole (REQUIP) 0.5 MG tablet TAKE ONE TABLET BY MOUTH EVERY MORNING AND 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    No facility-administered encounter medications on file as of 09/27/2019.     Objective:   Goals Addressed            This Visit's Progress     Patient Stated   . PharmD "I have a lot of medications" (pt-stated)       CARE PLAN ENTRY (see longtitudinal plan of care for additional care plan information)  Current Barriers:  . Polypharmacy - complex patient with multiple chronic conditions including COPD, tobacco abuse,  depression, RLS . Financial concerns: patient reported to RN CM yesterday that Hycodan cough syrup was expensive o Notes that it was $28 at [facility]. It is NOT covered by [place] Medicaid.  [name] Kitchen Receives medications in pill packages from [facility].  o COPD: follows w/ palliative care, pulmonology at [name]. Continues Advair 250/50 mcg BID + Spiriva 18 mcg daily, albuterol HFA or neb PRN; prednisone 10 mg daily. Notes that he is sometimes  forgetting his evening dose of Advair. o Depression/chronic pain: sertraline 150 mg daily, buspirone 10 mg BID o RLS: ropinirole 0.5 mg QAM, 1.5 mg QPM o GERD: omeprazole 20 mg QAM  Pharmacist Clinical Goal(s):  [name] Kitchen Over the next 90 days, patient will work with PharmD and PCP to address needs related to optmized medication management  Interventions: . Comprehensive medication review performed, medication list updated in electronic medical record . Inter-disciplinary care team collaboration (see longitudinal plan of care) . Reviewed [name] formulary. No other cough syrups are covered.  . As the medication is not covered on his insurance, we could pursue use of GoodRx card to assist with cost. Reviewed GoodRx website. Hycodan 120 mL would be ~$10 with GoodRx card at [name]. If PCP amenable to sending a fill of this, patient would like it sent to [name].   Patient Self Care Activities:  . Self administers medications as prescribed  Please see past updates related to this goal by clicking on the "Past Updates" button in the selected goal          Plan:  - Will outreach as previously scheduled  Austin Blevins, PharmD, [facility] Clinical Pharmacist [facility] [phone]

## 2019-09-27 NOTE — Progress Notes (Signed)
Hycodan refill for COPD with cough.  On PMP review last controlled substance fill 09/26/2019.

## 2019-09-27 NOTE — Patient Instructions (Addendum)
Visit Information  Goals Addressed            This Visit's Progress     Patient Stated   . PharmD "I have a lot of medications" (pt-stated)       CARE PLAN ENTRY (see longtitudinal plan of care for additional care plan information)  Current Barriers:  . Polypharmacy - complex patient with multiple chronic conditions including COPD, tobacco abuse, depression, RLS . Financial concerns: patient reported to RN CM yesterday that Hycodan cough syrup was expensive o Notes that it was $28 at the [facility]. It is NOT covered by [place] Medicaid.  [name] Kitchen Receives medications in pill packages from [facility].  o COPD: follows w/ palliative care, pulmonology at [facility]. Continues Advair 250/50 mcg BID + Spiriva 18 mcg daily, albuterol HFA or neb PRN; prednisone 10 mg daily. Notes that he is sometimes forgetting his evening dose of Advair. o Depression/chronic pain: sertraline 150 mg daily, buspirone 10 mg BID o RLS: ropinirole 0.5 mg QAM, 1.5 mg QPM o GERD: omeprazole 20 mg QAM  Pharmacist Clinical Goal(s):  [name] Kitchen Over the next 90 days, patient will work with PharmD and PCP to address needs related to optmized medication management  Interventions: . Comprehensive medication review performed, medication list updated in electronic medical record . Inter-disciplinary care team collaboration (see longitudinal plan of care) . Reviewed [place] Medicaid formulary. No other cough syrups are covered.  . As the medication is not covered on his insurance, we could pursue use of GoodRx card to assist with cost. Reviewed GoodRx website. Hycodan 120 mL would be ~$10 with GoodRx card at [name]. If PCP amenable to sending a fill of this, patient would like it sent to [name].   Patient Self Care Activities:  . Self administers medications as prescribed  Please see past updates related to this goal by clicking on the "Past Updates" button in the selected goal         Patient verbalizes  understanding of instructions provided today.     Plan:  - Will outreach as previously scheduled  Catie Feliz Beam, PharmD, [facility] Clinical Pharmacist [facility] [phone]

## 2019-09-29 ENCOUNTER — Ambulatory Visit: Payer: Self-pay

## 2019-09-29 NOTE — Chronic Care Management (AMB) (Signed)
  Care Management   Follow Up Note   09/29/2019 Name: Austin Blevins MRN: [id number] DOB: October 17, 1960  Referred by: [name], NP Reason for referral : Care Coordination   Austin Blevins is a 59 y.o. year old male who is a primary care patient of [name], [name], NP. The care management team was consulted for assistance with care management and care coordination needs.    Review of patient status, including review of consultants reports, relevant laboratory and other test results, and collaboration with appropriate care team members and the patient's provider was performed as part of comprehensive patient evaluation and provision of chronic care management services.    Austin Blevins completed CCM outreach attempt today but was unable to reach patient successfully. A HIPPA compliant voice message was left encouraging patient to return call once available. Austin Blevins rescheduled CCM SW appointment as well.  A HIPPA compliant phone message was left for the patient providing contact information and requesting a return call.    Austin Blevins, Austin Blevins, Austin Blevins, Austin Blevins [facility] Care Management Edith Endave  [facility] [email or web address] Phone: [phone]

## 2019-10-24 ENCOUNTER — Telehealth (INDEPENDENT_AMBULATORY_CARE_PROVIDER_SITE_OTHER): Payer: Medicare Other | Admitting: Nurse Practitioner

## 2019-10-24 ENCOUNTER — Other Ambulatory Visit: Payer: Self-pay | Admitting: Nurse Practitioner

## 2019-10-24 ENCOUNTER — Encounter: Payer: Self-pay | Admitting: Nurse Practitioner

## 2019-10-24 ENCOUNTER — Other Ambulatory Visit: Payer: Self-pay

## 2019-10-24 VITALS — Ht 67.0 in | Wt 122.0 lb

## 2019-10-24 DIAGNOSIS — F4321 Adjustment disorder with depressed mood: Secondary | ICD-10-CM | POA: Diagnosis not present

## 2019-10-24 DIAGNOSIS — Z8781 Personal history of (healed) traumatic fracture: Secondary | ICD-10-CM

## 2019-10-24 MED ORDER — ALPRAZOLAM 0.5 MG PO TABS: 0.5000 mg | ORAL_TABLET | Freq: Every day | ORAL | 0 refills | Status: AC | PRN

## 2019-10-24 NOTE — Patient Instructions (Signed)
Contact [facility] for grief support and group counseling if desired in the future.   Managing Loss, Adult People experience loss in many different ways throughout their lives. Events such as moving, changing jobs, and losing friends can create a sense of loss. The loss may be as serious as a major health change, divorce, death of a pet, or death of a loved one. All of these types of loss are likely to create a physical and emotional reaction known as grief. Grief is the result of a major change or an absence of something or someone that you count on. Grief is a normal reaction to loss. A variety of factors can affect your grieving experience, including:  The nature of your loss.  Your relationship to what or whom you lost.  Your understanding of grief and how to manage it.  Your support system. How to manage lifestyle changes Keep to your normal routine as much as possible.  If you have trouble focusing or doing normal activities, it is acceptable to take some time away from your normal routine.  Spend time with friends and loved ones.  Eat a healthy diet, get plenty of sleep, and rest when you feel tired. How to recognize changes  The way that you deal with your grief will affect your ability to function as you normally do. When grieving, you may experience these changes:  Numbness, shock, sadness, anxiety, anger, denial, and guilt.  Thoughts about death.  Unexpected crying.  A physical sensation of emptiness in your stomach.  Problems sleeping and eating.  Tiredness (fatigue).  Loss of interest in normal activities.  Dreaming about or imagining seeing the person who died.  A need to remember what or whom you lost.  Difficulty thinking about anything other than your loss for a period of time.  Relief. If you have been expecting the loss for a while, you may feel a sense of relief when it happens. Follow these instructions at home:  Activity Express your feelings in  healthy ways, such as:  Talking with others about your loss. It may be helpful to find others who have had a similar loss, such as a support group.  Writing down your feelings in a journal.  Doing physical activities to release stress and emotional energy.  Doing creative activities like painting, sculpting, or playing or listening to music.  Practicing resilience. This is the ability to recover and adjust after facing challenges. Reading some resources that encourage resilience may help you to learn ways to practice those behaviors. General instructions  Be patient with yourself and others. Allow the grieving process to happen, and remember that grieving takes time. ? It is likely that you may never feel completely done with some grief. You may find a way to move on while still cherishing memories and feelings about your loss. ? Accepting your loss is a process. It can take months or longer to adjust.  Keep all follow-up visits as told by your health care provider. This is important. Where to find support To get support for managing loss:  Ask your health care provider for help and recommendations, such as grief counseling or therapy.  Think about joining a support group for people who are managing a loss. Where to find more information You can find more information about managing loss from:  American Society of Clinical Oncology: www.cancer.net  American Psychological Association: [employer] Contact a health care provider if:  Your grief is extreme and keeps getting worse.  You  have ongoing grief that does not improve.  Your body shows symptoms of grief, such as illness.  You feel depressed, anxious, or lonely. Get help right away if:  You have thoughts about hurting yourself or others. If you ever feel like you may hurt yourself or others, or have thoughts about taking your own life, get help right away. You can go to your nearest emergency department or call:  Your  local emergency services (911 in the [place]  A suicide crisis helpline, such as the [facility] at 819-704-5757. This is open 24 hours a day. Summary  Grief is the result of a major change or an absence of someone or something that you count on. Grief is a normal reaction to loss.  The depth of grief and the period of recovery depend on the type of loss and your ability to adjust to the change and process your feelings.  Processing grief requires patience and a willingness to accept your feelings and talk about your loss with people who are supportive.  It is important to find resources that work for you and to realize that people experience grief differently. There is not one grieving process that works for everyone in the same way.  Be aware that when grief becomes extreme, it can lead to more severe issues like isolation, depression, anxiety, or suicidal thoughts. Talk with your health care provider if you have any of these issues. This information is not intended to replace advice given to you by your health care provider. Make sure you discuss any questions you have with your health care provider. Document Revised: 07/08/2018 Document Reviewed: 09/17/2016 Elsevier Patient Education  [place].

## 2019-10-24 NOTE — Progress Notes (Signed)
Ht 5\' 7"  (1.702 m)    Wt 122 lb (55.3 kg)    BMI 19.11 kg/m    Subjective:    Patient ID: Austin Blevins, male    DOB: 04/07/1961, 59 y.o.   MRN: [id number]  HPI: Austin Blevins is a 59 y.o. male presenting for grief.   Chief Complaint  Patient presents with   Depression    States his wife's visitation is tonight. States he doesn't know how well he'll do. Patient states he's not doing well without her.    Anxiety   Grief   GRIEF Patient states his wife recently passed away and her visitation is tonight.  He is very worried about making it through the service.  Duration:uncontrolled Anxious mood: yes  Excessive worrying: yes Irritability: yes  Sweating: no Nausea: no Palpitations:yes Hyperventilation: yes Panic attacks: yes Agoraphobia: no  Obscessions/compulsions: no Depressed mood: yes Depression screen [facility] 2/9 10/24/2019 09/18/2019 07/03/2019 05/30/2019 04/28/2019  Decreased Interest 3 3 3 3 3   Down, Depressed, Hopeless 3 2 2 2 2   PHQ - 2 Score 6 5 5 5 5   Altered sleeping 2 2 3 3 3   Tired, decreased energy 3 2 3 3 3   Change in appetite 3 2 1 3 2   Feeling bad or failure about yourself  2 1 2 2 1   Trouble concentrating 3 1 2 2 1   Moving slowly or fidgety/restless 1 1 1 1 1   Suicidal thoughts 1 0 1 1 1   PHQ-9 Score 21 14 18 20 17   Difficult doing work/chores Not difficult at all Somewhat difficult Extremely dIfficult Somewhat difficult Very difficult  Some recent data might be hidden   GAD 7 : Generalized Anxiety Score 10/24/2019 07/03/2019 06/24/2017  Nervous, Anxious, on Edge 3 3 3   Control/stop worrying 3 3 3   Worry too much - different things 3 3 3   Trouble relaxing 3 3 3   Restless 2 1 3   Easily annoyed or irritable 2 3 3   Afraid - awful might happen 3 0 1  Total GAD 7 Score 19 16 19   Anxiety Difficulty Not difficult at all Extremely difficult Very difficult   Anhedonia: yes Weight changes: no Insomnia: no   Hypersomnia: no Fatigue/loss of energy: yes Feelings  of worthlessness: yes Feelings of guilt: yes Impaired concentration/indecisiveness: no Suicidal ideations: no  Crying spells: yes Recent Stressors/Life Changes: yes   Relationship problems: no   Family stress: yes     Financial stress: yes    Job stress: no    Recent death/loss: yes; wife recently passed away   Patient also reports he ran out of his pain medication on Sunday and is requesting a refill.  Previously, he has received pain medication from neurosurgery.  Allergies  Allergen Reactions   Acetaminophen Other (See Comments)    Syncope   Duloxetine     Vivid dreams and hallucinations   Percocet [Oxycodone-Acetaminophen]     Only allergic to tylenol.   Outpatient Encounter Medications as of 10/24/2019  Medication Sig Note   albuterol (PROVENTIL) (2.5 MG/3ML) 0.083% nebulizer solution USE ONE VIAL IN NEBULIZER EVERY SIX HOURS AS NEEDED FOR WHEEZING OR SHORTNESS OF BREATH    albuterol (VENTOLIN HFA) 108 (90 Base) MCG/ACT inhaler Inhale 2 puffs into the lungs every 4 (four) hours as needed for wheezing or shortness of breath.    busPIRone (BUSPAR) 10 MG tablet TAKE ONE TABLET TWICE DAILY    Fluticasone-Salmeterol (ADVAIR DISKUS) 250-50 MCG/DOSE AEPB Inhale 1 puff  into the lungs 2 (two) times daily.    metoprolol tartrate (LOPRESSOR) 25 MG tablet Take 0.5 tablets (12.5 mg total) by mouth 2 (two) times daily.    montelukast (SINGULAIR) 10 MG tablet TAKE ONE TABLET AT BEDTIME    omeprazole (PRILOSEC) 20 MG capsule TAKE 1 CAPSULE BY MOUTH EVERY DAY    oxyCODONE (OXY IR/ROXICODONE) 5 MG immediate release tablet Take 5 mg by mouth every 6 (six) hours as needed.    predniSONE (DELTASONE) 10 MG tablet Take 10 mg by mouth daily with breakfast.    rOPINIRole (REQUIP) 0.5 MG tablet TAKE ONE TABLET EVERY MORNING AND TAKE THREE TABLETS AT BEDTIME AS NEEDED FOR RESTLESS LEG SYNDROME    sertraline (ZOLOFT) 100 MG tablet Take 1.5 tablets (150 mg total) by mouth daily.    SPIRIVA  HANDIHALER 18 MCG inhalation capsule INHALE CONTENTS OF 1 CAPSULE AS DIRECTEDEVERY DAY WITH HANDIHALER    ALPRAZolam (XANAX) 0.5 MG tablet Take 1 tablet (0.5 mg total) by mouth daily as needed for up to 5 days for anxiety.    HYDROcodone-homatropine (HYCODAN) 5-1.5 MG/5ML syrup Take 5 mLs by mouth every 8 (eight) hours as needed. (Patient not taking: Reported on 10/24/2019)    [DISCONTINUED] albuterol (PROVENTIL HFA;VENTOLIN HFA) 108 (90 Base) MCG/ACT inhaler Inhale 2 puffs into the lungs every 4 (four) hours as needed for wheezing or shortness of breath. 12/13/2018: Using 4x day   [DISCONTINUED] albuterol (VENTOLIN HFA) 108 (90 Base) MCG/ACT inhaler INHALE 2 PUFFS INTO THE LUNGS EVERY 4 HOURS AS NEEDED FOR WHEEZING OR SHORTNESS OF BREATH    [DISCONTINUED] rOPINIRole (REQUIP) 0.5 MG tablet TAKE ONE TABLET BY MOUTH EVERY MORNING AND 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    No facility-administered encounter medications on file as of 10/24/2019.   Patient Active Problem List   Diagnosis Date Noted   Grief 10/24/2019   Hydrocephalus ([place]) 07/21/2019   History of spinal fracture 07/21/2019   Pulmonary nodules 05/27/2019   Low back pain 12/21/2018   DNR (do not resuscitate) 10/24/2018   Restless legs syndrome (RLS) 08/02/2018   Gastroesophageal reflux disease 08/02/2018   Rotator cuff dysfunction, left 07/19/2018   Prediabetes 07/17/2018   Nicotine dependence, cigarettes, w unsp disorders 05/25/2017   Chronic pancreatitis ([place]) 05/25/2017   Stage 3 severe COPD by GOLD classification ([place]) 02/11/2017   Protein-calorie malnutrition ([place]) 01/22/2017   Chronic respiratory failure with hypoxia ([place]) 01/21/2017   Depression, major, recurrent, moderate ([place]) 04/11/2014   Past Medical History:  Diagnosis Date   Anxiety    COPD (chronic obstructive pulmonary disease) (HCC)    Depression    Hydrocephalus (HCC)    Relevant past medical, surgical, family and  social history reviewed and updated as indicated. Interim medical history since our last visit reviewed.  Review of Systems  Constitutional: Positive for activity change and appetite change. Negative for fatigue and fever.  Cardiovascular: Negative.  Negative for palpitations.  Gastrointestinal: Negative.  Negative for nausea.  Skin: Negative.  Negative for color change.  Neurological: Negative.  Negative for dizziness, weakness and headaches.  Psychiatric/Behavioral: Positive for decreased concentration and sleep disturbance. Negative for agitation, confusion, self-injury and suicidal ideas. The patient is nervous/anxious. The patient is not hyperactive.    Per HPI unless specifically indicated above     Objective:    Ht 5\' 7"  (1.702 m)    Wt 122 lb (55.3 kg)    BMI 19.11 kg/m   Wt Readings from Last 3 Encounters:  10/24/19 122 lb (55.3 kg)  09/18/19 123 lb 6.4 oz (56 kg)  08/02/19 126 lb 12.8 oz (57.5 kg)    Physical Exam Vitals and nursing note reviewed.  Constitutional:      Appearance: Normal appearance. He is ill-appearing.  HENT:     Head: Normocephalic and atraumatic.  Eyes:     General: No scleral icterus.    Extraocular Movements: Extraocular movements intact.  Pulmonary:     Effort: Pulmonary effort is normal. No respiratory distress.  Skin:    Coloration: Skin is not jaundiced or pale.  Neurological:     General: No focal deficit present.     Mental Status: He is alert and oriented to person, place, and time.     Motor: No weakness.  Psychiatric:        Mood and Affect: Mood normal.        Behavior: Behavior normal.        Thought Content: Thought content normal.        Judgment: Judgment normal.       Assessment & Plan:   Problem List Items Addressed This Visit      Other   History of spinal fracture    With chronic pain as a result.  Per PDMP, neurosurgery has been filling oxycodone prescription.  Advised to request refill of oxycodone from  neurosurgery.  Advised that pain clinic referral has been placed and he should be hearing from referral team to schedule appointment for chronic pain management.      Grief - Primary    Acute, ongoing.  With recent loss of spouse.  PDMP reviewed and will give very short-term alprazolam to use once daily for five days.  Discussed risks and side effects.  Educated extensively that this will be for short-term use only and not for long-term use.        Relevant Medications   ALPRAZolam (XANAX) 0.5 MG tablet       Follow up plan: Return if symptoms worsen or fail to improve.   Due to the catastrophic nature of the COVID-19 pandemic, this visit was completed via audio and visual contact via Mychart due to the restrictions of the COVID-19 pandemic. All issues as above were discussed and addressed. Physical exam was done as above through visual confirmation on Mychart. If it was felt that the patient should be evaluated in the office, they were directed there. The patient verbally consented to this visit."}  Location of the patient: home  Location of the provider: work  Those involved with this call:   Provider: Mardene Celeste, DNP  CMA: Myrtha Mantis, CMA  Front Desk/Registration: Adela Ports   Time spent on call: 15 minutes on the phone discussing health concerns. 20 minutes total spent in review of patient's record and preparation of their chart.  I verified patient identity using two factors (patient name and date of birth). Patient consents verbally to being seen via telemedicine visit today.

## 2019-10-24 NOTE — Assessment & Plan Note (Signed)
With chronic pain as a result.  Per PDMP, neurosurgery has been filling oxycodone prescription.  Advised to request refill of oxycodone from neurosurgery.  Advised that pain clinic referral has been placed and he should be hearing from referral team to schedule appointment for chronic pain management.

## 2019-10-24 NOTE — Assessment & Plan Note (Addendum)
Acute, ongoing.  With recent loss of spouse.  PDMP reviewed and will give very short-term alprazolam to use once daily for five days.  Discussed risks and side effects.  Educated extensively that this will be for short-term use only and not for long-term use.

## 2019-11-01 ENCOUNTER — Other Ambulatory Visit: Payer: Self-pay | Admitting: Nurse Practitioner

## 2019-11-01 ENCOUNTER — Telehealth: Payer: Self-pay | Admitting: Nurse Practitioner

## 2019-11-01 MED ORDER — OXYCODONE HCL 5 MG PO TABS: 5.0000 mg | ORAL_TABLET | Freq: Four times a day (QID) | ORAL | 0 refills | Status: AC | PRN

## 2019-11-01 NOTE — Telephone Encounter (Signed)
He has been getting Oxycodone from neurosurgery, I would request he reach out to them for refill first please.  Thank you.

## 2019-11-01 NOTE — Telephone Encounter (Signed)
Patient's sister notified and verbalized understanding.

## 2019-11-01 NOTE — Telephone Encounter (Signed)
Called and spoke to patient's sister. She states that neurosurgery will not give him anymore pain medication. She states that called them first last week and were only given a few tablets.

## 2019-11-01 NOTE — Telephone Encounter (Signed)
Routing to provider  

## 2019-11-01 NOTE — Telephone Encounter (Signed)
Please let them know I have sent in a 5 day supply, which is most we can allot for current pain until he sees pain management.  Sent in #20 pills and recommend he use this sparingly to make it to pain clinic appointment.    NOTE: On PMP review last fill Oxycodone 5 MG Q6H PRN was from neurosurgery on 10/24/2019 for #10 pills.

## 2019-11-01 NOTE — Telephone Encounter (Signed)
Pt has an appt with pain management July 1st but wanted to know if Jolene can send in something to help with his pain until then/ please advise

## 2019-11-09 ENCOUNTER — Inpatient Hospital Stay
Admission: EM | Admit: 2019-11-09 | Discharge: 2019-11-12 | DRG: 193 | Disposition: A | Discharge status: Home Health | Payer: Medicare Other | Attending: Internal Medicine | Admitting: Internal Medicine

## 2019-11-09 ENCOUNTER — Emergency Department: Payer: Medicare Other

## 2019-11-09 ENCOUNTER — Other Ambulatory Visit: Payer: Self-pay

## 2019-11-09 ENCOUNTER — Encounter: Payer: Self-pay | Admitting: Internal Medicine

## 2019-11-09 DIAGNOSIS — J449 Chronic obstructive pulmonary disease, unspecified: Secondary | ICD-10-CM | POA: Diagnosis not present

## 2019-11-09 DIAGNOSIS — Z83438 Family history of other disorder of lipoprotein metabolism and other lipidemia: Secondary | ICD-10-CM | POA: Diagnosis not present

## 2019-11-09 DIAGNOSIS — J441 Chronic obstructive pulmonary disease with (acute) exacerbation: Secondary | ICD-10-CM | POA: Diagnosis present

## 2019-11-09 DIAGNOSIS — G919 Hydrocephalus, unspecified: Secondary | ICD-10-CM | POA: Diagnosis present

## 2019-11-09 DIAGNOSIS — Z8249 Family history of ischemic heart disease and other diseases of the circulatory system: Secondary | ICD-10-CM

## 2019-11-09 DIAGNOSIS — Z9981 Dependence on supplemental oxygen: Secondary | ICD-10-CM

## 2019-11-09 DIAGNOSIS — Z818 Family history of other mental and behavioral disorders: Secondary | ICD-10-CM

## 2019-11-09 DIAGNOSIS — Z681 Body mass index (BMI) 19 or less, adult: Secondary | ICD-10-CM | POA: Diagnosis not present

## 2019-11-09 DIAGNOSIS — E43 Unspecified severe protein-calorie malnutrition: Secondary | ICD-10-CM | POA: Diagnosis present

## 2019-11-09 DIAGNOSIS — E872 Acidosis: Secondary | ICD-10-CM | POA: Diagnosis present

## 2019-11-09 DIAGNOSIS — F331 Major depressive disorder, recurrent, moderate: Secondary | ICD-10-CM | POA: Diagnosis present

## 2019-11-09 DIAGNOSIS — Z20822 Contact with and (suspected) exposure to covid-19: Secondary | ICD-10-CM | POA: Diagnosis present

## 2019-11-09 DIAGNOSIS — F17219 Nicotine dependence, cigarettes, with unspecified nicotine-induced disorders: Secondary | ICD-10-CM | POA: Diagnosis present

## 2019-11-09 DIAGNOSIS — Z7952 Long term (current) use of systemic steroids: Secondary | ICD-10-CM | POA: Diagnosis not present

## 2019-11-09 DIAGNOSIS — J9622 Acute and chronic respiratory failure with hypercapnia: Secondary | ICD-10-CM | POA: Diagnosis present

## 2019-11-09 DIAGNOSIS — J9621 Acute and chronic respiratory failure with hypoxia: Secondary | ICD-10-CM | POA: Diagnosis present

## 2019-11-09 DIAGNOSIS — Z833 Family history of diabetes mellitus: Secondary | ICD-10-CM

## 2019-11-09 DIAGNOSIS — F17210 Nicotine dependence, cigarettes, uncomplicated: Secondary | ICD-10-CM | POA: Diagnosis present

## 2019-11-09 DIAGNOSIS — J440 Chronic obstructive pulmonary disease with acute lower respiratory infection: Secondary | ICD-10-CM | POA: Diagnosis present

## 2019-11-09 DIAGNOSIS — J189 Pneumonia, unspecified organism: Secondary | ICD-10-CM | POA: Diagnosis present

## 2019-11-09 DIAGNOSIS — J9611 Chronic respiratory failure with hypoxia: Secondary | ICD-10-CM | POA: Diagnosis present

## 2019-11-09 DIAGNOSIS — G2581 Restless legs syndrome: Secondary | ICD-10-CM | POA: Diagnosis present

## 2019-11-09 DIAGNOSIS — Z79899 Other long term (current) drug therapy: Secondary | ICD-10-CM | POA: Diagnosis not present

## 2019-11-09 DIAGNOSIS — I10 Essential (primary) hypertension: Secondary | ICD-10-CM | POA: Diagnosis present

## 2019-11-09 DIAGNOSIS — Z982 Presence of cerebrospinal fluid drainage device: Secondary | ICD-10-CM | POA: Diagnosis not present

## 2019-11-09 LAB — BLOOD GAS, ARTERIAL
Acid-base deficit: 0 mmol/L (ref 0.0–2.0)
Bicarbonate: 26.4 mmol/L (ref 20.0–28.0)
FIO2: 0.28
O2 Saturation: 94.6 %
Patient temperature: 37
pCO2 arterial: 49 mmHg — ABNORMAL HIGH (ref 32.0–48.0)
pH, Arterial: 7.34 — ABNORMAL LOW (ref 7.350–7.450)
pO2, Arterial: 78 mmHg — ABNORMAL LOW (ref 83.0–108.0)

## 2019-11-09 LAB — CBC WITH DIFFERENTIAL/PLATELET
Abs Immature Granulocytes: 0.04 10*3/uL (ref 0.00–0.07)
Basophils Absolute: 0.1 10*3/uL (ref 0.0–0.1)
Basophils Relative: 1 %
Eosinophils Absolute: 0.2 10*3/uL (ref 0.0–0.5)
Eosinophils Relative: 2 %
HCT: 40 % (ref 39.0–52.0)
Hemoglobin: 13.7 g/dL (ref 13.0–17.0)
Immature Granulocytes: 1 %
Lymphocytes Relative: 24 %
Lymphs Abs: 1.8 10*3/uL (ref 0.7–4.0)
MCH: 31.4 pg (ref 26.0–34.0)
MCHC: 34.3 g/dL (ref 30.0–36.0)
MCV: 91.5 fL (ref 80.0–100.0)
Monocytes Absolute: 0.5 10*3/uL (ref 0.1–1.0)
Monocytes Relative: 6 %
Neutro Abs: 5 10*3/uL (ref 1.7–7.7)
Neutrophils Relative %: 66 %
Platelets: 352 10*3/uL (ref 150–400)
RBC: 4.37 MIL/uL (ref 4.22–5.81)
RDW: 12.1 % (ref 11.5–15.5)
WBC: 7.6 10*3/uL (ref 4.0–10.5)
nRBC: 0 % (ref 0.0–0.2)

## 2019-11-09 LAB — BASIC METABOLIC PANEL
Anion gap: 13 (ref 5–15)
BUN: 18 mg/dL (ref 6–20)
CO2: 29 mmol/L (ref 22–32)
Calcium: 8.9 mg/dL (ref 8.9–10.3)
Chloride: 100 mmol/L (ref 98–111)
Creatinine, Ser: 1.37 mg/dL — ABNORMAL HIGH (ref 0.61–1.24)
GFR calc Af Amer: >60 mL/min (ref >60)
GFR calc non Af Amer: 57 mL/min — ABNORMAL LOW (ref >60)
Glucose, Bld: 160 mg/dL — ABNORMAL HIGH (ref 70–99)
Potassium: 3.7 mmol/L (ref 3.5–5.1)
Sodium: 142 mmol/L (ref 135–145)

## 2019-11-09 LAB — BRAIN NATRIURETIC PEPTIDE: B Natriuretic Peptide: 43 pg/mL (ref 0.0–100.0)

## 2019-11-09 LAB — TROPONIN I (HIGH SENSITIVITY)
Troponin I (High Sensitivity): 3 ng/L (ref <18)
Troponin I (High Sensitivity): 9 ng/L (ref <18)

## 2019-11-09 LAB — SARS CORONAVIRUS 2 BY RT PCR (HOSPITAL ORDER, PERFORMED IN ~~LOC~~ HOSPITAL LAB): SARS Coronavirus 2: NEGATIVE

## 2019-11-09 MED ORDER — BUDESONIDE 0.25 MG/2ML IN SUSP
0.2500 mg | Freq: Two times a day (BID) | RESPIRATORY_TRACT | Status: DC
  Administered 2019-11-09 – 2019-11-12 (×6): 0.25 mg via RESPIRATORY_TRACT
  Filled 2019-11-09 (×6): qty 2

## 2019-11-09 MED ORDER — METOPROLOL TARTRATE 25 MG PO TABS
12.5000 mg | ORAL_TABLET | Freq: Two times a day (BID) | ORAL | Status: DC
  Administered 2019-11-09 – 2019-11-12 (×6): 12.5 mg via ORAL
  Filled 2019-11-09 (×6): qty 1

## 2019-11-09 MED ORDER — PREDNISONE 10 MG PO TABS
10.0000 mg | ORAL_TABLET | Freq: Every day | ORAL | Status: DC
  Filled 2019-11-09: qty 1

## 2019-11-09 MED ORDER — TIOTROPIUM BROMIDE MONOHYDRATE 18 MCG IN CAPS
18.0000 ug | ORAL_CAPSULE | Freq: Every day | RESPIRATORY_TRACT | Status: DC
  Administered 2019-11-09 – 2019-11-12 (×3): 18 ug via RESPIRATORY_TRACT
  Filled 2019-11-09: qty 5

## 2019-11-09 MED ORDER — SERTRALINE HCL 50 MG PO TABS: 150.0000 mg | ORAL_TABLET | Freq: Every day | ORAL | Status: DC

## 2019-11-09 MED ORDER — IOHEXOL 350 MG/ML SOLN
75.0000 mL | Freq: Once | INTRAVENOUS | Status: AC | PRN
  Administered 2019-11-09: 75 mL via INTRAVENOUS

## 2019-11-09 MED ORDER — SODIUM CHLORIDE 0.9 % IV SOLN
2.0000 g | INTRAVENOUS | Status: DC
  Administered 2019-11-09 – 2019-11-11 (×3): 2 g via INTRAVENOUS
  Filled 2019-11-09 (×2): qty 20
  Filled 2019-11-09 (×2): qty 2

## 2019-11-09 MED ORDER — ACETAMINOPHEN 325 MG PO TABS
650.0000 mg | ORAL_TABLET | Freq: Once | ORAL | Status: AC
  Administered 2019-11-09: 650 mg via ORAL
  Filled 2019-11-09: qty 2

## 2019-11-09 MED ORDER — ALPRAZOLAM 0.5 MG PO TABS
0.5000 mg | ORAL_TABLET | ORAL | Status: DC | PRN
  Administered 2019-11-11: 0.5 mg via ORAL
  Filled 2019-11-09: qty 1

## 2019-11-09 MED ORDER — PANTOPRAZOLE SODIUM 40 MG PO TBEC
40.0000 mg | DELAYED_RELEASE_TABLET | Freq: Every day | ORAL | Status: DC
  Administered 2019-11-09 – 2019-11-12 (×4): 40 mg via ORAL
  Filled 2019-11-09 (×4): qty 1

## 2019-11-09 MED ORDER — SODIUM CHLORIDE 0.9 % IV SOLN
500.0000 mg | INTRAVENOUS | Status: DC
  Administered 2019-11-09 – 2019-11-11 (×3): 500 mg via INTRAVENOUS
  Filled 2019-11-09 (×4): qty 500

## 2019-11-09 MED ORDER — ROPINIROLE HCL 0.25 MG PO TABS
0.5000 mg | ORAL_TABLET | Freq: Three times a day (TID) | ORAL | Status: DC
  Administered 2019-11-09 – 2019-11-12 (×8): 0.5 mg via ORAL
  Filled 2019-11-09 (×11): qty 2

## 2019-11-09 MED ORDER — SERTRALINE HCL 50 MG PO TABS
150.0000 mg | ORAL_TABLET | Freq: Every day | ORAL | Status: DC
  Administered 2019-11-09 – 2019-11-12 (×4): 150 mg via ORAL
  Filled 2019-11-09 (×4): qty 3

## 2019-11-09 MED ORDER — BUSPIRONE HCL 10 MG PO TABS
10.0000 mg | ORAL_TABLET | Freq: Two times a day (BID) | ORAL | Status: DC
  Administered 2019-11-09 – 2019-11-12 (×6): 10 mg via ORAL
  Filled 2019-11-09 (×7): qty 1

## 2019-11-09 MED ORDER — MONTELUKAST SODIUM 10 MG PO TABS
10.0000 mg | ORAL_TABLET | Freq: Every day | ORAL | Status: DC
  Administered 2019-11-09 – 2019-11-11 (×3): 10 mg via ORAL
  Filled 2019-11-09 (×3): qty 1

## 2019-11-09 MED ORDER — ALBUTEROL SULFATE (2.5 MG/3ML) 0.083% IN NEBU
2.5000 mg | INHALATION_SOLUTION | Freq: Four times a day (QID) | RESPIRATORY_TRACT | Status: DC | PRN
  Administered 2019-11-10 – 2019-11-11 (×2): 2.5 mg via RESPIRATORY_TRACT
  Filled 2019-11-09 (×2): qty 3

## 2019-11-09 MED ORDER — ENOXAPARIN SODIUM 40 MG/0.4ML ~~LOC~~ SOLN
40.0000 mg | SUBCUTANEOUS | Status: DC
  Administered 2019-11-09 – 2019-11-11 (×3): 40 mg via SUBCUTANEOUS
  Filled 2019-11-09 (×3): qty 0.4

## 2019-11-09 MED ORDER — ALBUTEROL (5 MG/ML) CONTINUOUS INHALATION SOLN
5.0000 mg/h | INHALATION_SOLUTION | Freq: Once | RESPIRATORY_TRACT | Status: AC
  Administered 2019-11-09: 5 mg/h via RESPIRATORY_TRACT
  Filled 2019-11-09: qty 20

## 2019-11-09 MED ORDER — DULOXETINE HCL 30 MG PO CPEP
30.0000 mg | ORAL_CAPSULE | Freq: Two times a day (BID) | ORAL | Status: DC
  Administered 2019-11-09 – 2019-11-12 (×6): 30 mg via ORAL
  Filled 2019-11-09 (×6): qty 1

## 2019-11-09 MED ORDER — SODIUM CHLORIDE 0.9 % IV SOLN: INTRAVENOUS | Status: DC

## 2019-11-09 MED ORDER — OXYCODONE HCL 5 MG PO TABS
5.0000 mg | ORAL_TABLET | Freq: Four times a day (QID) | ORAL | Status: DC | PRN
  Administered 2019-11-09 – 2019-11-12 (×8): 5 mg via ORAL
  Filled 2019-11-09 (×8): qty 1

## 2019-11-09 NOTE — ED Triage Notes (Signed)
Pt here via [facility] with resp distress. 78% on RA at home. Hx of COPD. 125 solumedrol and 1 duoneb given by ems.

## 2019-11-09 NOTE — ED Notes (Signed)
Rt notified about arterial blood gas

## 2019-11-09 NOTE — ED Notes (Signed)
Pt placed on BiPAP

## 2019-11-09 NOTE — ED Provider Notes (Signed)
[facility] Emergency Department Provider Note ____________________________________________   First MD Initiated Contact with Patient 11/09/19 0902     (approximate)  I have reviewed the triage vital signs and the nursing notes.   HISTORY  Chief Complaint Respiratory Distress    HPI Austin Blevins is a 59 y.o. male with PMH as noted below including COPD on 3 L O2 at home who presents with worsened shortness of breath, acute onset this morning, and now improved after medications given by EMS.  EMS states that they gave duo nebs and Solu-Medrol.  They report that his O2 saturation was in the high 60s on 3 L when they arrived.  He is now on a nonrebreather.  The patient denies any fever, GI symptoms, or acute pain.  Past Medical History:  Diagnosis Date  . Anxiety   . COPD (chronic obstructive pulmonary disease) (HCC)   . Depression   . Hydrocephalus [facility])     Patient Active Problem List   Diagnosis Date Noted  . Grief 10/24/2019  . Hydrocephalus (HCC) 07/21/2019  . History of spinal fracture 07/21/2019  . Pulmonary nodules 05/27/2019  . Low back pain 12/21/2018  . DNR (do not resuscitate) 10/24/2018  . Restless legs syndrome (RLS) 08/02/2018  . Gastroesophageal reflux disease 08/02/2018  . Rotator cuff dysfunction, left 07/19/2018  . Prediabetes 07/17/2018  . Nicotine dependence, cigarettes, w unsp disorders 05/25/2017  . Chronic pancreatitis (HCC) 05/25/2017  . Stage 3 severe COPD by GOLD classification (HCC) 02/11/2017  . Protein-calorie malnutrition (HCC) 01/22/2017  . Chronic respiratory failure with hypoxia (HCC) 01/21/2017  . Depression, major, recurrent, moderate (HCC) 04/11/2014    Past Surgical History:  Procedure Laterality Date  . BRAIN SURGERY  2003   VA shunt for hydrocephalus  . ventricular shunt      Prior to Admission medications   Medication Sig Start Date End Date Taking? Authorizing Provider  albuterol (VENTOLIN HFA)  108 (90 Base) MCG/ACT inhaler Inhale 2 puffs into the lungs every 4 (four) hours as needed for wheezing or shortness of breath. 08/07/19  Yes [name], Jolene T, NP  ALPRAZolam (XANAX) 0.5 MG tablet Take 1 tablet by mouth as needed. 10/24/19  Yes [provider]  busPIRone (BUSPAR) 10 MG tablet TAKE ONE TABLET TWICE DAILY 10/24/19  Yes [name], Jolene T, NP  DULoxetine (CYMBALTA) 30 MG capsule Take 30 mg by mouth 2 (two) times daily.   Yes [provider]  metoprolol tartrate (LOPRESSOR) 25 MG tablet Take 0.5 tablets (12.5 mg total) by mouth 2 (two) times daily. 08/02/19 11/09/19 Yes Darlin Priestly, MD  montelukast (SINGULAIR) 10 MG tablet TAKE ONE TABLET AT BEDTIME 07/29/19  Yes [name], Jolene T, NP  omeprazole (PRILOSEC) 20 MG capsule TAKE 1 CAPSULE BY MOUTH EVERY DAY 02/20/19  Yes [name], Jolene T, NP  predniSONE (DELTASONE) 10 MG tablet Take 10 mg by mouth daily with breakfast.   Yes [provider]  rOPINIRole (REQUIP) 0.5 MG tablet TAKE ONE TABLET EVERY MORNING AND TAKE THREE TABLETS AT BEDTIME AS NEEDED FOR RESTLESS LEG SYNDROME 09/22/19  Yes [name], Jolene T, NP  sertraline (ZOLOFT) 100 MG tablet Take 1.5 tablets (150 mg total) by mouth daily. Patient taking differently: Take by mouth. TAKE 1 TABLET EVERY DAY ALONG WITH 50MG  TABLET TO EQUAL 150MG  TOTAL DAILY 07/03/19  Yes [name], Jolene T, NP  sertraline (ZOLOFT) 50 MG tablet Take by mouth. TAKE 1 TABLET EVERY DAY ALONG WITH 100MG  TABLET TO EQUAL 150MG  TOTAL DAILY  09/22/19  Yes [provider]  SPIRIVA HANDIHALER 18 MCG inhalation capsule INHALE CONTENTS OF 1 CAPSULE AS DIRECTEDEVERY DAY WITH HANDIHALER 07/28/19  Yes [name], Jolene T, NP  albuterol (PROVENTIL) (2.5 MG/3ML) 0.083% nebulizer solution USE ONE VIAL IN NEBULIZER EVERY SIX HOURS AS NEEDED FOR WHEEZING OR SHORTNESS OF BREATH 02/08/19   [name], Jolene T, NP  albuterol (PROVENTIL HFA;VENTOLIN HFA) 108 (90 Base) MCG/ACT inhaler Inhale 2 puffs into the lungs every 4  (four) hours as needed for wheezing or shortness of breath. 08/02/18   [name], [name] T, NP  albuterol (VENTOLIN HFA) 108 (90 Base) MCG/ACT inhaler INHALE 2 PUFFS INTO THE LUNGS EVERY 4 HOURS AS NEEDED FOR WHEEZING OR SHORTNESS OF BREATH 02/23/19   [name], Jolene T, NP  rOPINIRole (REQUIP) 0.5 MG tablet TAKE ONE TABLET BY MOUTH EVERY MORNING AND 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 04/24/19   Aura Dials T, NP    Allergies Acetaminophen, Duloxetine, and Percocet [oxycodone-acetaminophen]  Family History  Problem Relation Age of Onset  . Diabetes Mellitus II Mother   . CAD Father   . Hypertension Father   . Arthritis/Rheumatoid Sister   . Hyperlipidemia Sister   . Hypertension Sister   . Depression Sister   . Anxiety disorder Sister   . Diabetes Sister   . Heart disease Brother   . Other Brother        DDD  . Diabetes Sister   . Diabetes Sister   . Arthritis/Rheumatoid Brother   . Other Brother        DDD    Social History Social History   Tobacco Use  . Smoking status: Current Every Day Smoker    Packs/day: 1.00    Years: 40.00    Pack years: 40.00    Types: Cigarettes  . Smokeless tobacco: Never Used  . Tobacco comment: Informed patient that we can give him materials when he is ready to quit.  Vaping Use  . Vaping Use: Never used  Substance Use Topics  . Alcohol use: No  . Drug use: No    Review of Systems  Constitutional: No fever/chills. Eyes: No redness. ENT: No sore throat. Cardiovascular: Denies chest pain. Respiratory: Positive for shortness of breath. Gastrointestinal: No vomiting or diarrhea.  Genitourinary: Negative for flank pain. Musculoskeletal: Negative for back pain. Skin: Negative for rash. Neurological: Negative for headache.   ____________________________________________   PHYSICAL EXAM:  VITAL SIGNS: ED Triage Vitals  Enc Vitals Group     BP 11/09/19 0903 131/85     Pulse Rate 11/09/19 0903 94     Resp  11/09/19 0903 (!) 22     Temp 11/09/19 0903 97.7 F (36.5 C)     Temp Source 11/09/19 0903 Axillary     SpO2 11/09/19 0903 100 %     Weight 11/09/19 0902 122 lb (55.3 kg)     Height 11/09/19 0902 5\' 9"  (1.753 m)     Head Circumference --      Peak Flow --      Pain Score 11/09/19 0902 8     Pain Loc --      Pain Edu? --      Excl. in GC? --     Constitutional: Alert and oriented.  Somewhat uncomfortable and weak appearing but in no acute distress. Eyes: Conjunctivae are normal.  Head: Atraumatic. Nose: No congestion/rhinnorhea. Mouth/Throat: Mucous membranes are dry.   Neck: Normal range of motion.  Cardiovascular: Normal rate, regular rhythm.  Grossly normal heart sounds.  Good peripheral circulation. Respiratory: Increased respiratory effort, but speaking full sentences.  Diffuse wheezing bilaterally. Gastrointestinal: Soft and nontender. No distention.  Genitourinary: No flank tenderness. Musculoskeletal: No lower extremity edema.  Extremities warm and well perfused.  Neurologic:  Normal speech and language. No gross focal neurologic deficits are appreciated.  Skin:  Skin is warm and dry. No rash noted. Psychiatric: Mood and affect are normal. Speech and behavior are normal.  ____________________________________________   LABS (all labs ordered are listed, but only abnormal results are displayed)  Labs Reviewed  BASIC METABOLIC PANEL - Abnormal; Notable for the following components:      Result Value   Glucose, Bld 160 (*)    Creatinine, Ser 1.37 (*)    GFR calc non Af Amer 57 (*)    All other components within normal limits  BLOOD GAS, VENOUS - Abnormal; Notable for the following components:   pH, Ven 7.23 (*)    pCO2, Ven 82 (*)    Bicarbonate 34.3 (*)    Acid-Base Excess 3.8 (*)    All other components within normal limits  SARS CORONAVIRUS 2 BY RT PCR (HOSPITAL ORDER, [facility])  CBC WITH DIFFERENTIAL/PLATELET  BRAIN NATRIURETIC  PEPTIDE  TROPONIN I (HIGH SENSITIVITY)  TROPONIN I (HIGH SENSITIVITY)   ____________________________________________  EKG  ED ECG REPORT I, Arta Silence, the attending physician, personally viewed and interpreted this ECG.  Date: 11/09/2019 EKG Time: 0904 Rate: 91 Rhythm: normal sinus rhythm QRS Axis: Borderline right axis Intervals: normal ST/T Wave abnormalities: normal Narrative Interpretation: no evidence of acute ischemia  ____________________________________________  RADIOLOGY  CXR: Left upper lobe opacity  ____________________________________________   PROCEDURES  Procedure(s) performed: No  Procedures  Critical Care performed: Yes  CRITICAL CARE Performed by: Arta Silence   Total critical care time: 30 minutes  Critical care time was exclusive of separately billable procedures and treating other patients.  Critical care was necessary to treat or prevent imminent or life-threatening deterioration.  Critical care was time spent personally by me on the following activities: development of treatment plan with patient and/or surrogate as well as nursing, discussions with consultants, evaluation of patient's response to treatment, examination of patient, obtaining history from patient or surrogate, ordering and performing treatments and interventions, ordering and review of laboratory studies, ordering and review of radiographic studies, pulse oximetry and re-evaluation of patient's condition. ____________________________________________   INITIAL IMPRESSION / ASSESSMENT AND PLAN / ED COURSE  Pertinent labs & imaging results that were available during my care of the patient were reviewed by me and considered in my medical decision making (see chart for details).  59 year old male with PMH as noted above including COPD on 3 L home O2 presents with acute onset of worsening shortness of breath this morning.  Per EMS, O2 saturation was as low as the  60s on his normal 3 L when they arrived.  He received Solu-Medrol, duo nebs, and was placed on a nonrebreather.  I reviewed the past medical records in [place].  The patient was most recently admitted in March with a COPD exacerbation.  On exam he has increased work of breathing but no acute respiratory distress.  O2 saturation is in the high 90s on nonrebreather.  He has diffuse wheezing bilaterally but good air entry.  The remainder of the exam is as described above.  Presentation is most consistent with acute COPD exacerbation, but differential includes acute bronchitis, pneumonia, or less likely cardiac etiology.  At this time the patient is doing well enough and has improved significantly that there is no indication for BiPAP.  We will obtain labs including a VBG, chest x-ray, continue albuterol, and reassess.  ----------------------------------------- 12:17 PM on 11/09/2019 -----------------------------------------  The patient's VBG revealed significant hypercapnia, so I started him on BiPAP which he is tolerating well.  Lab work-up is unremarkable otherwise.  His chest x-ray shows a left upper lobe opacity which was previously seen on other x-rays.  Clinically there is no evidence of pneumonia.  The patient has no fever, elevated WBC count, or evidence of sepsis.  The patient will require admission; I discussed his case with Dr. Joylene Igo from the hospitalist service.  ____________________________________________   FINAL CLINICAL IMPRESSION(S) / ED DIAGNOSES  Final diagnoses:  Acute on chronic respiratory failure with hypoxia and hypercapnia (HCC)  COPD exacerbation (HCC)      NEW MEDICATIONS STARTED DURING THIS VISIT:  New Prescriptions   No medications on file     Note:  This document was prepared using Dragon voice recognition software and may include unintentional dictation errors.    Dionne Bucy, MD 11/09/19 1218

## 2019-11-09 NOTE — H&P (Signed)
History and Physical    Austin Blevins DZH:[id number] DOB: 13-Sep-1960 DOA: 11/09/2019  PCP: [name], NP   Patient coming from: Home  I have personally briefly reviewed patient's old medical records in [facility]  Chief Complaint: Shortness of breath  HPI: Austin Blevins is a 59 y.o. male with medical history significant for hydrocephalus with known VP shunt, severe COPD with chronic respiratory failure on home O2 at 3 L, anxiety and restless leg syndrome who presented to the emergency room via EMS for evaluation of respiratory distress.  Patient has had shortness of breath for the past couple of days which has progressively worsened.  When EMS arrived he was noted to have room air pulse oximetry of 78% and was placed on a nonrebreather mask with improvement in his pulse oximetry.  Patient received Solu-Medrol 125 mg IV in the field as well as a dose of DuoNeb.  He has a cough which is productive of clear phlegm but denies having any fever or chills.  He denies having any sick contacts Blood gas done in the emergency room which showed uncompensated respiratory acidosis and so patient was switched from a nonrebreather mask to a BiPAP. He denies having any chest pain, nausea, vomiting, dizziness, lightheadedness, abdominal pain or any changes in his bowel habits. Chest x-ray showed increasing opacity within emphysematous changes in the LEFT upper lobe as compared to the previous study. Findings may represent developing pneumonia. Asymmetric pulmonary edema could have a similar appearance. Patchy opacities elsewhere are stable based on comparison with March 11th radiograph. Persistent LEFT lower lobe opacity and effusion. Patient had a CT angiogram of the chest which was negative for PE but showed new pleural based density in the medial aspect of the left upper lobe posteriorly. This corresponds to the chest x-ray findings and given its acute nature likely represents focal pneumonia.  Short-term follow-up is recommended to assess for resolution following appropriate therapy. Twelve-lead EKG showed normal sinus rhythm.    ED Course: Patient is a 59 year old male with a history of COPD and chronic respiratory failure on 3 L of oxygen who was brought into the emergency room for evaluation of worsening shortness of breath and found to be hypoxic in the field.  Arterial blood gas revealed uncompensated respiratory acidosis and patient is currently on BiPAP.  Imaging is suggestive of left upper lobe pneumonia.  Patient will be admitted to the hospital for further evaluation.  Review of Systems: As per HPI otherwise 10 point review of systems negative.    Past Medical History:  Diagnosis Date  . Anxiety   . COPD (chronic obstructive pulmonary disease) (HCC)   . Depression   . Hydrocephalus [facility])     Past Surgical History:  Procedure Laterality Date  . BRAIN SURGERY  2003   VA shunt for hydrocephalus  . ventricular shunt       reports that he has been smoking cigarettes. He has a 40.00 pack-year smoking history. He has never used smokeless tobacco. He reports that he does not drink alcohol and does not use drugs.  Allergies  Allergen Reactions  . Acetaminophen Other (See Comments)    Syncope  . Duloxetine     Vivid dreams and hallucinations  . Percocet [Oxycodone-Acetaminophen]     Only allergic to tylenol.    Family History  Problem Relation Age of Onset  . Diabetes Mellitus II Mother   . CAD Father   . Hypertension Father   . Arthritis/Rheumatoid Sister   .  Hyperlipidemia Sister   . Hypertension Sister   . Depression Sister   . Anxiety disorder Sister   . Diabetes Sister   . Heart disease Brother   . Other Brother        DDD  . Diabetes Sister   . Diabetes Sister   . Arthritis/Rheumatoid Brother   . Other Brother        DDD     Prior to Admission medications   Medication Sig Start Date End Date Taking? Authorizing Provider  albuterol  (VENTOLIN HFA) 108 (90 Base) MCG/ACT inhaler Inhale 2 puffs into the lungs every 4 (four) hours as needed for wheezing or shortness of breath. 08/07/19  Yes [name], Jolene T, NP  ALPRAZolam (XANAX) 0.5 MG tablet Take 1 tablet by mouth as needed. 10/24/19  Yes [provider]  busPIRone (BUSPAR) 10 MG tablet TAKE ONE TABLET TWICE DAILY 10/24/19  Yes [name], Jolene T, NP  DULoxetine (CYMBALTA) 30 MG capsule Take 30 mg by mouth 2 (two) times daily.   Yes [provider]  metoprolol tartrate (LOPRESSOR) 25 MG tablet Take 0.5 tablets (12.5 mg total) by mouth 2 (two) times daily. 08/02/19 11/09/19 Yes Darlin PriestlyLai, Tina, MD  montelukast (SINGULAIR) 10 MG tablet TAKE ONE TABLET AT BEDTIME 07/29/19  Yes [name], Jolene T, NP  omeprazole (PRILOSEC) 20 MG capsule TAKE 1 CAPSULE BY MOUTH EVERY DAY 02/20/19  Yes [name], Jolene T, NP  predniSONE (DELTASONE) 10 MG tablet Take 10 mg by mouth daily with breakfast.   Yes [provider]  rOPINIRole (REQUIP) 0.5 MG tablet TAKE ONE TABLET EVERY MORNING AND TAKE THREE TABLETS AT BEDTIME AS NEEDED FOR RESTLESS LEG SYNDROME 09/22/19  Yes [name], Jolene T, NP  sertraline (ZOLOFT) 100 MG tablet Take 1.5 tablets (150 mg total) by mouth daily. Patient taking differently: Take by mouth. TAKE 1 TABLET EVERY DAY ALONG WITH 50MG  TABLET TO EQUAL 150MG  TOTAL DAILY 07/03/19  Yes [name], Jolene T, NP  sertraline (ZOLOFT) 50 MG tablet Take by mouth. TAKE 1 TABLET EVERY DAY ALONG WITH 100MG  TABLET TO EQUAL 150MG  TOTAL DAILY 09/22/19  Yes [provider]  SPIRIVA HANDIHALER 18 MCG inhalation capsule INHALE CONTENTS OF 1 CAPSULE AS DIRECTEDEVERY DAY WITH HANDIHALER 07/28/19  Yes [name], Jolene T, NP  albuterol (PROVENTIL) (2.5 MG/3ML) 0.083% nebulizer solution USE ONE VIAL IN NEBULIZER EVERY SIX HOURS AS NEEDED FOR WHEEZING OR SHORTNESS OF BREATH 02/08/19   [name], Jolene T, NP  albuterol (PROVENTIL HFA;VENTOLIN HFA) 108 (90 Base) MCG/ACT inhaler Inhale 2 puffs into the  lungs every 4 (four) hours as needed for wheezing or shortness of breath. 08/02/18   [name], Corrie DandyJolene T, NP  albuterol (VENTOLIN HFA) 108 (90 Base) MCG/ACT inhaler INHALE 2 PUFFS INTO THE LUNGS EVERY 4 HOURS AS NEEDED FOR WHEEZING OR SHORTNESS OF BREATH 02/23/19   [name], Jolene T, NP  rOPINIRole (REQUIP) 0.5 MG tablet TAKE ONE TABLET BY MOUTH EVERY MORNING AND 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 04/24/19   Aura Dialsannady, Jolene T, NP    Physical Exam: Vitals:   11/09/19 1300 11/09/19 1330 11/09/19 1451 11/09/19 1512  BP: 98/68 103/74  102/87  Pulse: 100 98 (!) 110 (!) 104  Resp:   (!) 26 (!) 24  Temp:      TempSrc:      SpO2: 96% 95% 97% 98%  Weight:      Height:         Vitals:   11/09/19 1300 11/09/19 1330 11/09/19 1451  11/09/19 1512  BP: 98/68 103/74  102/87  Pulse: 100 98 (!) 110 (!) 104  Resp:   (!) 26 (!) 24  Temp:      TempSrc:      SpO2: 96% 95% 97% 98%  Weight:      Height:        Constitutional: NAD, alert and oriented x 2, chronically ill-appearing Eyes: PERRL, lids and conjunctivae normal ENMT: Mucous membranes are moist.  Neck: normal, supple, no masses, no thyromegaly Respiratory: Scattered rhonchi, no wheezing, no crackles. Normal respiratory effort. No accessory muscle use.  Cardiovascular: Regular rate and rhythm, no murmurs / rubs / gallops. No extremity edema. 2+ pedal pulses. No carotid bruits.  Abdomen: no tenderness, no masses palpated. No hepatosplenomegaly. Bowel sounds positive.  Musculoskeletal: no clubbing / cyanosis. No joint deformity upper and lower extremities.  Skin: no rashes, lesions, ulcers.  Neurologic: No gross focal neurologic deficit. Psychiatric: Normal mood and affect.   Labs on Admission: I have personally reviewed following labs and imaging studies  CBC: Recent Labs  Lab 11/09/19 0906  WBC 7.6  NEUTROABS 5.0  HGB 13.7  HCT 40.0  MCV 91.5  PLT 352   Basic Metabolic Panel: Recent Labs  Lab 11/09/19 0906    NA 142  K 3.7  CL 100  CO2 29  GLUCOSE 160*  BUN 18  CREATININE 1.37*  CALCIUM 8.9   GFR: Estimated Creatinine Clearance: 46.5 mL/min (A) (by C-G formula based on SCr of 1.37 mg/dL (H)). Liver Function Tests: No results for input(s): AST, ALT, ALKPHOS, BILITOT, PROT, ALBUMIN in the last 168 hours. No results for input(s): LIPASE, AMYLASE in the last 168 hours. No results for input(s): AMMONIA in the last 168 hours. Coagulation Profile: No results for input(s): INR, PROTIME in the last 168 hours. Cardiac Enzymes: No results for input(s): CKTOTAL, CKMB, CKMBINDEX, TROPONINI in the last 168 hours. BNP (last 3 results) No results for input(s): PROBNP in the last 8760 hours. HbA1C: No results for input(s): HGBA1C in the last 72 hours. CBG: No results for input(s): GLUCAP in the last 168 hours. Lipid Profile: No results for input(s): CHOL, HDL, LDLCALC, TRIG, CHOLHDL, LDLDIRECT in the last 72 hours. Thyroid Function Tests: No results for input(s): TSH, T4TOTAL, FREET4, T3FREE, THYROIDAB in the last 72 hours. Anemia Panel: No results for input(s): VITAMINB12, FOLATE, FERRITIN, TIBC, IRON, RETICCTPCT in the last 72 hours. Urine analysis:    Component Value Date/Time   COLORURINE STRAW (A) 07/22/2019 0600   APPEARANCEUR CLEAR (A) 07/22/2019 0600   LABSPEC 1.028 07/22/2019 0600   PHURINE 5.0 07/22/2019 0600   GLUCOSEU NEGATIVE 07/22/2019 0600   HGBUR NEGATIVE 07/22/2019 0600   BILIRUBINUR NEGATIVE 07/22/2019 0600   KETONESUR NEGATIVE 07/22/2019 0600   PROTEINUR NEGATIVE 07/22/2019 0600   NITRITE NEGATIVE 07/22/2019 0600   LEUKOCYTESUR NEGATIVE 07/22/2019 0600    Radiological Exams on Admission: CT ANGIO CHEST PE W OR WO CONTRAST  Result Date: 11/09/2019 CLINICAL DATA:  Respiratory distress and hypoxia EXAM: CT ANGIOGRAPHY CHEST WITH CONTRAST TECHNIQUE: Multidetector CT imaging of the chest was performed using the standard protocol during bolus administration of intravenous  contrast. Multiplanar CT image reconstructions and MIPs were obtained to evaluate the vascular anatomy. CONTRAST:  13mL OMNIPAQUE IOHEXOL 350 MG/ML SOLN COMPARISON:  Chest x-ray from earlier in the same day. FINDINGS: Cardiovascular: Thoracic aorta demonstrates atherosclerotic calcifications similar to that seen on prior examination. No aneurysmal dilatation or dissection is noted. Pulmonary artery is well visualized with a  normal branching pattern. No filling defects are identified to suggest pulmonary emboli. Right innominate vein is significantly narrowed with multiple collaterals in the chest wall and mediastinum identified. Mediastinum/Nodes: Thoracic inlet is within normal limits. No hilar or mediastinal adenopathy is noted. The esophagus is within normal limits. Lungs/Pleura: Lungs again demonstrate diffuse emphysematous changes. Stable superior segment right lower lobe nodule is seen. Stable right upper lobe nodule is noted on image number 30 of series 6. Nodular density in the left upper lobe is also stable on image number 27 of series 6. There is however a new pleural based density identified on the left which measures approximately 4.2 x 2.0 cm. This corresponds to the changes seen on recent plain film examination and given its acute nature is most consistent with focal pneumonia and likely some associated loculated fluid. Persistent changes in the left base are noted with loculated pleural fluid similar to that seen on the prior exam. Some associated atelectatic changes are noted. Upper Abdomen: Chronic pancreatitis changes are seen. No other focal abnormality is noted. Musculoskeletal: Degenerative changes of the thoracic spine are noted. No acute rib abnormality is seen. Review of the MIP images confirms the above findings. IMPRESSION: No evidence of pulmonary emboli. New pleural based density in the medial aspect of the left upper lobe posteriorly. This corresponds to the chest x-ray findings and  given its acute nature likely represents focal pneumonia. Short-term follow-up is recommended to assess for resolution following appropriate therapy. Scattered bilateral parenchymal nodules within the lungs stable from the prior exams. Stable severe changes of COPD. Stable loculated fluid collection in the left base Electronically Signed   By: Alcide Clever M.D.   On: 11/09/2019 15:05   DG Chest Portable 1 View  Result Date: 11/09/2019 CLINICAL DATA:  Hypoxia EXAM: PORTABLE CHEST 1 VIEW COMPARISON:  09/26/2019 FINDINGS: RIGHT-sided IJ catheter remains in place with unchanged position. Signs of pulmonary emphysema as before. Increasing opacity within emphysematous changes in the LEFT upper lobe as compared to the previous study. Persistent LEFT lower lobe opacity and effusion. Cardiomediastinal contours are stable. Visualized skeletal structures without acute process. IMPRESSION: 1. Increasing opacity within emphysematous changes in the LEFT upper lobe as compared to the previous study. Findings may represent developing pneumonia. Asymmetric pulmonary edema could have a similar appearance. Patchy opacities elsewhere are stable based on comparison with March 11th radiograph. 2. Persistent LEFT lower lobe opacity and effusion. Electronically Signed   By: Donzetta Kohut M.D.   On: 11/09/2019 09:31    EKG: Independently reviewed.  Normal sinus rhythm  Assessment/Plan Principal Problem:   CAP (community acquired pneumonia) Active Problems:   Chronic respiratory failure with hypoxia (HCC)   Stage 3 severe COPD by GOLD classification (HCC)   Nicotine dependence, cigarettes, w unsp disorders   Depression, major, recurrent, moderate (HCC)   Acute on chronic respiratory failure with hypercapnia (HCC)    Community-acquired pneumonia Patient with a history of chronic respiratory failure and COPD who presents for evaluation of worsening shortness of breath from his baseline associated with a cough productive  of clear phlegm Imaging shows a left upper lobe pneumonia We will treat patient empirically with Rocephin and Zithromax   Acute on chronic hypercapnic respiratory failure Secondary to community-acquired pneumonia Patient has a history of chronic respiratory failure and is usually on 3 L of oxygen but was hypoxic in the field with pulse oximetry of 78% on room air and required a nonrebreather mask initially but was transitioned to a BiPAP  due to uncompensated respiratory acidosis We will attempt to wean patient off BiPAP as tolerated   COPD with acute exacerbation Continue as needed bronchodilator therapy, Spiriva as well as inhaled steroids Patient is on chronic steroid therapy which will be continued during this hospitalization Continue oxygen supplementation to maintain pulse oximetry greater than 92%   Depression Continue buspirone, sertraline and Cymbalta   Hypertension Continue metoprolol   Restless leg syndrome Continue ropinirole  DVT prophylaxis: Lovenox Code Status: Full code Family Communication: Greater than 50% of time was spent discussing plan of care with patient and his sister at the bedside.  All questions and concerns have been addressed.  They verbalized understanding and agree with the plan. Disposition Plan: Back to previous home environment Consults called: None    Lauriana Denes MD [facility]     11/09/2019, 4:06 PM

## 2019-11-09 NOTE — ED Notes (Signed)
Austin Blevins work ascom number is [phone]. Pt Austin available on this phone until 4pm. Pt gives permission for Austin to be updated via phone with updates.

## 2019-11-10 ENCOUNTER — Encounter: Payer: Self-pay | Admitting: Internal Medicine

## 2019-11-10 DIAGNOSIS — J9611 Chronic respiratory failure with hypoxia: Secondary | ICD-10-CM

## 2019-11-10 DIAGNOSIS — J9622 Acute and chronic respiratory failure with hypercapnia: Secondary | ICD-10-CM

## 2019-11-10 DIAGNOSIS — J189 Pneumonia, unspecified organism: Principal | ICD-10-CM

## 2019-11-10 DIAGNOSIS — J449 Chronic obstructive pulmonary disease, unspecified: Secondary | ICD-10-CM

## 2019-11-10 LAB — BASIC METABOLIC PANEL
Anion gap: 9 (ref 5–15)
BUN: 24 mg/dL — ABNORMAL HIGH (ref 6–20)
CO2: 30 mmol/L (ref 22–32)
Calcium: 8.3 mg/dL — ABNORMAL LOW (ref 8.9–10.3)
Chloride: 104 mmol/L (ref 98–111)
Creatinine, Ser: 1.19 mg/dL (ref 0.61–1.24)
GFR calc Af Amer: >60 mL/min (ref >60)
GFR calc non Af Amer: >60 mL/min (ref >60)
Glucose, Bld: 112 mg/dL — ABNORMAL HIGH (ref 70–99)
Potassium: 3.8 mmol/L (ref 3.5–5.1)
Sodium: 143 mmol/L (ref 135–145)

## 2019-11-10 LAB — CBC
HCT: 34.2 % — ABNORMAL LOW (ref 39.0–52.0)
Hemoglobin: 11.3 g/dL — ABNORMAL LOW (ref 13.0–17.0)
MCH: 31.7 pg (ref 26.0–34.0)
MCHC: 33 g/dL (ref 30.0–36.0)
MCV: 95.8 fL (ref 80.0–100.0)
Platelets: 274 10*3/uL (ref 150–400)
RBC: 3.57 MIL/uL — ABNORMAL LOW (ref 4.22–5.81)
RDW: 12 % (ref 11.5–15.5)
WBC: 9.4 10*3/uL (ref 4.0–10.5)
nRBC: 0 % (ref 0.0–0.2)

## 2019-11-10 LAB — HIV ANTIBODY (ROUTINE TESTING W REFLEX): HIV Screen 4th Generation wRfx: NONREACTIVE

## 2019-11-10 MED ORDER — GUAIFENESIN 100 MG/5ML PO SOLN
5.0000 mL | ORAL | Status: DC | PRN
  Administered 2019-11-10 – 2019-11-11 (×5): 100 mg via ORAL
  Filled 2019-11-10 (×7): qty 5

## 2019-11-10 MED ORDER — BENZONATATE 100 MG PO CAPS
100.0000 mg | ORAL_CAPSULE | Freq: Three times a day (TID) | ORAL | Status: DC | PRN
  Administered 2019-11-10 – 2019-11-12 (×6): 100 mg via ORAL
  Filled 2019-11-10 (×6): qty 1

## 2019-11-10 MED ORDER — PREDNISONE 20 MG PO TABS: 40.0000 mg | ORAL_TABLET | Freq: Every day | ORAL | Status: DC

## 2019-11-10 MED ORDER — PREDNISONE 20 MG PO TABS
40.0000 mg | ORAL_TABLET | Freq: Every day | ORAL | Status: DC
  Administered 2019-11-10 – 2019-11-12 (×3): 40 mg via ORAL
  Filled 2019-11-10 (×3): qty 2

## 2019-11-10 MED ORDER — ADULT MULTIVITAMIN W/MINERALS CH
1.0000 | ORAL_TABLET | Freq: Every day | ORAL | Status: DC
  Administered 2019-11-11 – 2019-11-12 (×2): 1 via ORAL
  Filled 2019-11-10 (×2): qty 1

## 2019-11-10 MED ORDER — ENSURE ENLIVE PO LIQD
237.0000 mL | Freq: Three times a day (TID) | ORAL | Status: DC
  Administered 2019-11-10 – 2019-11-11 (×4): 237 mL via ORAL

## 2019-11-10 NOTE — Progress Notes (Addendum)
Progress Note    Austin Blevins  PXT:[id number] DOB: [birth year]  DOA: 11/09/2019 PCP: [name], NP      Brief Narrative:    Medical records reviewed and are as summarized below:  Austin Blevins is a 59 y.o. male with medical history significant forhydrocephalus with known VP shunt, severe COPD with chronic respiratory failure on home O2 at 3 L, anxiety and restless leg syndrome who presented to the emergency room via EMS for evaluation of respiratory distress.  His oxygen saturation was 78% on room air so he was placed on oxygen via nonrebreathing mask and given IV Solu-Medrol and DuoNeb by EMS prior to transfer to the emergency room.  In the ED, he required BiPAP for acute hypoxic and hypercapnic respiratory failure.  Work-up also reviewed community-acquired pneumonia.      Assessment/Plan:   Principal Problem:   CAP (community acquired pneumonia) Active Problems:   Chronic respiratory failure with hypoxia (HCC)   Stage 3 severe COPD by GOLD classification (HCC)   Nicotine dependence, cigarettes, w unsp disorders   Depression, major, recurrent, moderate (HCC)   Acute on chronic respiratory failure with hypercapnia (HCC)    Community-acquired pneumonia: Continue empiric IV antibiotics.  Acute on chronic hypoxemic and hypercapnic respiratory failure: Improving.  Continue 2 L/min oxygen via nasal cannula.  He is on 2 to 3 L/min oxygen at home.  He is off of BiPAP.   COPD exacerbation: Continue prednisone and bronchodilators.  Depression: Continue antidepressants  Restless leg syndrome: Continue ropinirole  Hypertension: Continue metoprolol  History of hydrocephalus s/p VP shunt    Nutrition Problem: Severe Malnutrition Etiology: chronic illness (COPD)  Signs/Symptoms: moderate fat depletion, severe fat depletion, moderate muscle depletion, severe muscle depletion   Body mass index is 16.97 kg/m.  (Underweight): Encourage adequate oral intake.   Follow-up with dietitian.  Diet Order            DIET DYS 3 Room service appropriate? Yes; Fluid consistency: Thin  Diet effective now                       Medications:   . budesonide (PULMICORT) nebulizer solution  0.25 mg Nebulization BID  . busPIRone  10 mg Oral BID  . DULoxetine  30 mg Oral BID  . enoxaparin (LOVENOX) injection  40 mg Subcutaneous Q24H  . feeding supplement (ENSURE ENLIVE)  237 mL Oral TID BM  . metoprolol tartrate  12.5 mg Oral BID  . montelukast  10 mg Oral QHS  . [START ON 11/11/2019] multivitamin with minerals  1 tablet Oral Daily  . pantoprazole  40 mg Oral Daily  . predniSONE  40 mg Oral Q breakfast  . rOPINIRole  0.5 mg Oral TID  . sertraline  150 mg Oral Daily  . tiotropium  18 mcg Inhalation Daily   Continuous Infusions: . sodium chloride 100 mL/hr at 11/10/19 0134  . azithromycin Stopped (11/09/19 2021)  . cefTRIAXone (ROCEPHIN)  IV Stopped (11/09/19 1816)     Anti-infectives (From admission, onward)   Start     Dose/Rate Route Frequency Ordered Stop   11/09/19 1800  cefTRIAXone (ROCEPHIN) 2 g in sodium chloride 0.9 % 100 mL IVPB     Discontinue     2 g 200 mL/hr over 30 Minutes Intravenous Every 24 hours 11/09/19 1549 11/14/19 1759   11/09/19 1800  azithromycin (ZITHROMAX) 500 mg in sodium chloride 0.9 % 250 mL IVPB  Discontinue     500 mg 250 mL/hr over 60 Minutes Intravenous Every 24 hours 11/09/19 1549 11/14/19 1759             Family Communication/Anticipated D/C date and plan/Code Status   DVT prophylaxis: enoxaparin (LOVENOX) injection 40 mg Start: 11/09/19 2000     Code Status: Full Code  Family Communication: Plan discussed with the sister at the bedside Disposition Plan:    Status is: Inpatient  Remains inpatient appropriate because:IV treatments appropriate due to intensity of illness or inability to take PO and Inpatient level of care appropriate due to severity of illness   Dispo: The patient is  from: Home              Anticipated d/c is to: Home              Anticipated d/c date is: 2 days              Patient currently is not medically stable to d/c.           Subjective:   C/o severe cough that is rarely productive of sputum.  His breathing is a little better today.  No chest pain.  His sister is at the bedside.  Objective:    Vitals:   11/09/19 2013 11/10/19 0421 11/10/19 0825 11/10/19 1151  BP:  91/63 117/72 103/71  Pulse:  79 87 61  Resp:  20 19 18   Temp:  97.8 F (36.6 C) 98 F (36.7 C) 98.4 F (36.9 C)  TempSrc:  Oral  Oral  SpO2: 98% 98% 99% 100%  Weight:      Height:       No data found.   Intake/Output Summary (Last 24 hours) at 11/10/2019 1424 Last data filed at 11/10/2019 0950 Gross per 24 hour  Intake 1553.84 ml  Output 850 ml  Net 703.84 ml   Filed Weights   11/09/19 0902 11/09/19 1848  Weight: 55.3 kg 50.6 kg    Exam:  GEN: NAD SKIN: No rash EYES: EOMI ENT: MMM CV: RRR PULM: Decreased air entry bilaterally, bilateral expiratory wheezing, no rales heard ABD: soft, ND, NT, +BS CNS: AAO x 3, non focal EXT: No edema or tenderness    Data Reviewed:   I have personally reviewed following labs and imaging studies:  Labs: Labs show the following:   Basic Metabolic Panel: Recent Labs  Lab 11/09/19 0906 11/10/19 0500  NA 142 143  K 3.7 3.8  CL 100 104  CO2 29 30  GLUCOSE 160* 112*  BUN 18 24*  CREATININE 1.37* 1.19  CALCIUM 8.9 8.3*   GFR Estimated Creatinine Clearance: 49 mL/min (by C-G formula based on SCr of 1.19 mg/dL). Liver Function Tests: No results for input(s): AST, ALT, ALKPHOS, BILITOT, PROT, ALBUMIN in the last 168 hours. No results for input(s): LIPASE, AMYLASE in the last 168 hours. No results for input(s): AMMONIA in the last 168 hours. Coagulation profile No results for input(s): INR, PROTIME in the last 168 hours.  CBC: Recent Labs  Lab 11/09/19 0906 11/10/19 0500  WBC 7.6 9.4  NEUTROABS  5.0  --   HGB 13.7 11.3*  HCT 40.0 34.2*  MCV 91.5 95.8  PLT 352 274   Cardiac Enzymes: No results for input(s): CKTOTAL, CKMB, CKMBINDEX, TROPONINI in the last 168 hours. BNP (last 3 results) No results for input(s): PROBNP in the last 8760 hours. CBG: No results for input(s): GLUCAP in the last 168 hours. D-Dimer: No  results for input(s): DDIMER in the last 72 hours. Hgb A1c: No results for input(s): HGBA1C in the last 72 hours. Lipid Profile: No results for input(s): CHOL, HDL, LDLCALC, TRIG, CHOLHDL, LDLDIRECT in the last 72 hours. Thyroid function studies: No results for input(s): TSH, T4TOTAL, T3FREE, THYROIDAB in the last 72 hours.  Invalid input(s): FREET3 Anemia work up: No results for input(s): VITAMINB12, FOLATE, FERRITIN, TIBC, IRON, RETICCTPCT in the last 72 hours. Sepsis Labs: Recent Labs  Lab 11/09/19 0906 11/10/19 0500  WBC 7.6 9.4    Microbiology Recent Results (from the past 240 hour(s))  SARS Coronavirus 2 by RT PCR (hospital order, performed in [facility] hospital lab) Nasopharyngeal Nasopharyngeal Swab     Status: None   Collection Time: 11/09/19  9:06 AM   Specimen: Nasopharyngeal Swab  Result Value Ref Range Status   SARS Coronavirus 2 NEGATIVE NEGATIVE Final    Comment: (NOTE) SARS-CoV-2 target nucleic acids are NOT DETECTED.  The SARS-CoV-2 RNA is generally detectable in upper and lower respiratory specimens during the acute phase of infection. The lowest concentration of SARS-CoV-2 viral copies this assay can detect is 250 copies / mL. A negative result does not preclude SARS-CoV-2 infection and should not be used as the sole basis for treatment or other patient management decisions.  A negative result may occur with improper specimen collection / handling, submission of specimen other than nasopharyngeal swab, presence of viral mutation(s) within the areas targeted by this assay, and inadequate number of viral copies (<250 copies / mL). A  negative result must be combined with clinical observations, patient history, and epidemiological information.  Fact Sheet for Patients:   BoilerBrush.com.cy  Fact Sheet for Healthcare Providers: https://pope.com/  This test is not yet approved or  cleared by the [place] FDA and has been authorized for detection and/or diagnosis of SARS-CoV-2 by FDA under an Emergency Use Authorization (EUA).  This EUA will remain in effect (meaning this test can be used) for the duration of the COVID-19 declaration under Section 564(b)(1) of the Act, 21 U.S.C. section 360bbb-3(b)(1), unless the authorization is terminated or revoked sooner.  Performed at [facility], [address] [place], [place] [address]     Procedures and diagnostic studies:  CT ANGIO CHEST PE W OR WO CONTRAST  Result Date: 11/09/2019 CLINICAL DATA:  Respiratory distress and hypoxia EXAM: CT ANGIOGRAPHY CHEST WITH CONTRAST TECHNIQUE: Multidetector CT imaging of the chest was performed using the standard protocol during bolus administration of intravenous contrast. Multiplanar CT image reconstructions and MIPs were obtained to evaluate the vascular anatomy. CONTRAST:  14mL OMNIPAQUE IOHEXOL 350 MG/ML SOLN COMPARISON:  Chest x-ray from earlier in the same day. FINDINGS: Cardiovascular: Thoracic aorta demonstrates atherosclerotic calcifications similar to that seen on prior examination. No aneurysmal dilatation or dissection is noted. Pulmonary artery is well visualized with a normal branching pattern. No filling defects are identified to suggest pulmonary emboli. Right innominate vein is significantly narrowed with multiple collaterals in the chest wall and mediastinum identified. Mediastinum/Nodes: Thoracic inlet is within normal limits. No hilar or mediastinal adenopathy is noted. The esophagus is within normal limits. Lungs/Pleura: Lungs again demonstrate diffuse  emphysematous changes. Stable superior segment right lower lobe nodule is seen. Stable right upper lobe nodule is noted on image number 30 of series 6. Nodular density in the left upper lobe is also stable on image number 27 of series 6. There is however a new pleural based density identified on the left which measures approximately  4.2 x 2.0 cm. This corresponds to the changes seen on recent plain film examination and given its acute nature is most consistent with focal pneumonia and likely some associated loculated fluid. Persistent changes in the left base are noted with loculated pleural fluid similar to that seen on the prior exam. Some associated atelectatic changes are noted. Upper Abdomen: Chronic pancreatitis changes are seen. No other focal abnormality is noted. Musculoskeletal: Degenerative changes of the thoracic spine are noted. No acute rib abnormality is seen. Review of the MIP images confirms the above findings. IMPRESSION: No evidence of pulmonary emboli. New pleural based density in the medial aspect of the left upper lobe posteriorly. This corresponds to the chest x-ray findings and given its acute nature likely represents focal pneumonia. Short-term follow-up is recommended to assess for resolution following appropriate therapy. Scattered bilateral parenchymal nodules within the lungs stable from the prior exams. Stable severe changes of COPD. Stable loculated fluid collection in the left base Electronically Signed   By: Alcide Clever M.D.   On: 11/09/2019 15:05   DG Chest Portable 1 View  Result Date: 11/09/2019 CLINICAL DATA:  Hypoxia EXAM: PORTABLE CHEST 1 VIEW COMPARISON:  09/26/2019 FINDINGS: RIGHT-sided IJ catheter remains in place with unchanged position. Signs of pulmonary emphysema as before. Increasing opacity within emphysematous changes in the LEFT upper lobe as compared to the previous study. Persistent LEFT lower lobe opacity and effusion. Cardiomediastinal contours are stable.  Visualized skeletal structures without acute process. IMPRESSION: 1. Increasing opacity within emphysematous changes in the LEFT upper lobe as compared to the previous study. Findings may represent developing pneumonia. Asymmetric pulmonary edema could have a similar appearance. Patchy opacities elsewhere are stable based on comparison with March 11th radiograph. 2. Persistent LEFT lower lobe opacity and effusion. Electronically Signed   By: Donzetta Kohut M.D.   On: 11/09/2019 09:31               LOS: 1 day   Michaele Amundson  [facility]     11/10/2019, 2:24 PM

## 2019-11-10 NOTE — TOC Initial Note (Signed)
Transition of Care [facility]) - Initial/Assessment Note    Patient Details  Name: Austin Blevins MRN: [id number] Date of Birth: 08-23-1960  Transition of [facility]) CM/SW Contact:    Victorino Dike, RN Phone Number: 11/10/2019, 1:40 PM  Clinical Narrative:                  Met with patient and sister in room.  Patient not opposed to having [place] RN to help with home medication management.   Expected Discharge Plan: [place] Barriers to Discharge: Continued Medical Work up   Patient Goals and CMS Choice Patient states their goals for this hospitalization and ongoing recovery are:: to return home with sister CMS Medicare.gov Compare Post Acute Care list provided to:: Patient Choice offered to / list presented to : Patient  Expected Discharge Plan and Services Expected Discharge Plan: [place] In-house Referral: Clinical Social Work Discharge Planning Services: CM Consult Post Acute Care Choice: [place] arrangements for the past 2 months: [place]: RN [place]: [place] ([place]) Date [place]: 11/10/19 Time [place]: [id number] Representative spoke with at [place]: [place] Arrangements/Services Living arrangements for the past 2 months: [place] with:: Self, Siblings Patient language and need for interpreter reviewed:: Yes Do you feel safe going back to the place where you live?: Yes      Need for Family Participation in Patient Care: No (Comment) Care giver support system in place?: Yes (comment) Current home services: DME (walker) Criminal Activity/Legal Involvement Pertinent to Current Situation/Hospitalization: No - Comment as needed  Activities of Daily Living Home Assistive Devices/Equipment: Oxygen, Hospital bed, Cane (specify quad or straight), Bedside commode/3-in-1, Walker (specify type) ADL Screening  (condition at time of admission) Patient's cognitive ability adequate to safely complete daily activities?: Yes Is the patient deaf or have difficulty hearing?: No Does the patient have difficulty seeing, even when wearing glasses/contacts?: No Does the patient have difficulty concentrating, remembering, or making decisions?: No Patient able to express need for assistance with ADLs?: Yes Does the patient have difficulty dressing or bathing?: No Independently performs ADLs?: No Communication: Independent Dressing (OT): Independent Grooming: Independent Feeding: Independent Bathing: Needs assistance Is this a change from baseline?: Pre-admission baseline Toileting: Independent In/Out Bed: Needs assistance Is this a change from baseline?: Pre-admission baseline Walks in Home: Needs assistance Is this a change from baseline?: Pre-admission baseline Does the patient have difficulty walking or climbing stairs?: Yes Weakness of Legs: Both Weakness of Arms/Hands: None  Permission Sought/Granted                  Emotional Assessment Appearance:: Appears older than stated age Attitude/Demeanor/Rapport: Engaged Affect (typically observed): Accepting, Calm Orientation: : Oriented to Self, Oriented to Place, Oriented to  Time, Oriented to Situation Alcohol / Substance Use: Not Applicable Psych Involvement: No (comment)  Admission diagnosis:  COPD exacerbation ([place]) [J44.1] CAP (community acquired pneumonia) [J18.9] Acute on chronic respiratory failure with hypoxia and hypercapnia (HCC) [Q11.94, J96.22] Patient Active Problem List   Diagnosis Date Noted  . CAP (community acquired pneumonia) 11/09/2019  . Acute on chronic respiratory failure with hypercapnia ([place]) 11/09/2019  . Grief 10/24/2019  . Hydrocephalus ([place]) 07/21/2019  . History of spinal fracture 07/21/2019  .  Pulmonary nodules 05/27/2019  . Low back pain 12/21/2018  . DNR (do not resuscitate) 10/24/2018  . Restless  legs syndrome (RLS) 08/02/2018  . Gastroesophageal reflux disease 08/02/2018  . Rotator cuff dysfunction, left 07/19/2018  . Prediabetes 07/17/2018  . Nicotine dependence, cigarettes, w unsp disorders 05/25/2017  . Chronic pancreatitis ([place]) 05/25/2017  . Stage 3 severe COPD by GOLD classification ([place]) 02/11/2017  . Protein-calorie malnutrition ([place]) 01/22/2017  . Chronic respiratory failure with hypoxia ([place]) 01/21/2017  . Depression, major, recurrent, moderate ([place]) 04/11/2014   PCP:  [name], NP Pharmacy:   [place], [place] [address] [place] [address] Phone: [phone] Fax: [place], [address] [place] [address] Phone: [phone] Fax: [phone]     Social Determinants of Health (SDOH) Interventions    Readmission Risk Interventions No flowsheet data found.

## 2019-11-10 NOTE — Progress Notes (Signed)
Patient c/o of cough and restless legs. Reports that he feels like someone is trying to jump out of his legs. [place] NP notified. Per NP, 10 AM dose of requip can be given early. Orders placed for cough medication. Will continue to monitor.

## 2019-11-10 NOTE — Progress Notes (Signed)
Initial Nutrition Assessment  DOCUMENTATION CODES:   Severe malnutrition in context of chronic illness  INTERVENTION:   Ensure Enlive po TID, each supplement provides 350 kcal and 20 grams of protein  MVI daily   Dysphagia 3 diet   NUTRITION DIAGNOSIS:   Severe Malnutrition related to chronic illness (COPD) as evidenced by moderate to severe fat depletions, moderate to severe muscle depletions.  GOAL:   Patient will meet greater than or equal to 90% of their needs  MONITOR:   PO intake, Supplement acceptance, Labs, Weight trends, Skin, I & O's  REASON FOR ASSESSMENT:   Malnutrition Screening Tool    ASSESSMENT:   59 y.o. male with medical history significant for hydrocephalus with known VP shunt, severe COPD with chronic respiratory failure on home O2 at 3 L, anxiety, depression and restless leg syndrome who is admitted with CAP.   Pt is well known to this RD from multiple previous admits. Pt with poor appetite and oral intake at baseline; pt generally eats one meal per day at home. Pt does generally eats well in hospital and is compliant with Ensure supplements. Of note, pt with poor dentition. RD will add supplements and MVI to help pt meet his estimated needs. RD will also change pt to a mechanical soft diet. Per chart, pt is down 10lbs(8%) over the past 2-3 weeks; this is significant however some weight loss may be r/t dehydration.    Medications reviewed and include: lovenox, prednisone, NaCl @100ml /hr, azithromycin, ceftriaxone    Labs reviewed: BUN 24(H)  Nutrition-Focused physical exam completed. Findings are moderate to severe fat depletions, moderate to severe muscle depletions, and no edema.   Diet Order:   Diet Order            DIET DYS 3 Room service appropriate? Yes; Fluid consistency: Thin  Diet effective now                EDUCATION NEEDS:   Education needs have been addressed  Skin:  Skin Assessment: Reviewed RN Assessment (ecchymosis)  Last  BM:  6/23  Height:   Ht Readings from Last 1 Encounters:  11/09/19 5\' 8"  (1.727 m)    Weight:   Wt Readings from Last 1 Encounters:  11/09/19 50.6 kg    Ideal Body Weight:  70 kg  BMI:  Body mass index is 16.97 kg/m.  Estimated Nutritional Needs:   Kcal:  1700-2000kcal/day  Protein:  85-100g/day  Fluid:  >1.5L/day  MS, RD, LDN Please refer to [facility] for RD and/or RD on-call/weekend/after hours pager

## 2019-11-10 NOTE — Plan of Care (Signed)
  Problem: Clinical Measurements: Goal: Ability to maintain clinical measurements within normal limits will improve Outcome: Progressing   Problem: Pain Managment: Goal: General experience of comfort will improve Outcome: Progressing   Problem: Safety: Goal: Ability to remain free from injury will improve Outcome: Progressing   

## 2019-11-11 NOTE — Progress Notes (Signed)
Pt refused bed alarm but was educated about safety.. Will continue to monitor. 

## 2019-11-11 NOTE — Progress Notes (Signed)
Pt refused bed alarm. Pt encouraged to call for assistance when ambulating.

## 2019-11-11 NOTE — Progress Notes (Addendum)
Progress Note    Austin Blevins  JQB:[id number] DOB: 01-19-1961  DOA: 11/09/2019 PCP: [name], NP      Brief Narrative:    Medical records reviewed and are as summarized below:  Austin Blevins is a 59 y.o. male with medical history significant forhydrocephalus with known VP shunt, severe COPD with chronic respiratory failure on home O2 at 3 L, anxiety and restless leg syndrome who presented to the emergency room via EMS for evaluation of respiratory distress.  His oxygen saturation was 78% on room air so he was placed on oxygen via nonrebreathing mask and given IV Solu-Medrol and DuoNeb by EMS prior to transfer to the emergency room.  In the ED, he required BiPAP for acute hypoxic and hypercapnic respiratory failure.  Work-up also reviewed community-acquired pneumonia.      Assessment/Plan:   Principal Problem:   CAP (community acquired pneumonia) Active Problems:   Chronic respiratory failure with hypoxia (HCC)   Stage 3 severe COPD by GOLD classification (HCC)   Nicotine dependence, cigarettes, w unsp disorders   Depression, major, recurrent, moderate (HCC)   Acute on chronic respiratory failure with hypercapnia (HCC)    Community-acquired pneumonia: Continue empiric IV antibiotics.  Acute on chronic hypoxemic and hypercapnic respiratory failure: Improving.  Continue 2 -3L/min oxygen via nasal cannula.  He is on 2 to 3 L/min oxygen at home.   COPD exacerbation: Continue prednisone and bronchodilators.  Depression: Continue antidepressants  Restless leg syndrome: Continue ropinirole  Hypertension: Continue metoprolol  History of hydrocephalus s/p VP shunt    Nutrition Problem: Severe Malnutrition Etiology: chronic illness (COPD)  Signs/Symptoms: moderate fat depletion, severe fat depletion, moderate muscle depletion, severe muscle depletion   Body mass index is 16.89 kg/m.  (Underweight): Encourage adequate oral intake.  Follow-up with  dietitian.  Encouraged ambulation   Diet Order            DIET DYS 3 Room service appropriate? Yes; Fluid consistency: Thin  Diet effective now                       Medications:   . budesonide (PULMICORT) nebulizer solution  0.25 mg Nebulization BID  . busPIRone  10 mg Oral BID  . DULoxetine  30 mg Oral BID  . enoxaparin (LOVENOX) injection  40 mg Subcutaneous Q24H  . feeding supplement (ENSURE ENLIVE)  237 mL Oral TID BM  . metoprolol tartrate  12.5 mg Oral BID  . montelukast  10 mg Oral QHS  . multivitamin with minerals  1 tablet Oral Daily  . pantoprazole  40 mg Oral Daily  . predniSONE  40 mg Oral Q breakfast  . rOPINIRole  0.5 mg Oral TID  . sertraline  150 mg Oral Daily  . tiotropium  18 mcg Inhalation Daily   Continuous Infusions: . azithromycin 500 mg (11/10/19 1603)  . cefTRIAXone (ROCEPHIN)  IV 2 g (11/10/19 1805)     Anti-infectives (From admission, onward)   Start     Dose/Rate Route Frequency Ordered Stop   11/09/19 1800  cefTRIAXone (ROCEPHIN) 2 g in sodium chloride 0.9 % 100 mL IVPB     Discontinue     2 g 200 mL/hr over 30 Minutes Intravenous Every 24 hours 11/09/19 1549 11/14/19 1759   11/09/19 1800  azithromycin (ZITHROMAX) 500 mg in sodium chloride 0.9 % 250 mL IVPB     Discontinue     500 mg 250 mL/hr over 60  Minutes Intravenous Every 24 hours 11/09/19 1549 11/14/19 1759             Family Communication/Anticipated D/C date and plan/Code Status   DVT prophylaxis: enoxaparin (LOVENOX) injection 40 mg Start: 11/09/19 2000     Code Status: Full Code  Family Communication: Plan discussed with the sister at the bedside Disposition Plan:    Status is: Inpatient  Remains inpatient appropriate because:IV treatments appropriate due to intensity of illness or inability to take PO and Inpatient level of care appropriate due to severity of illness   Dispo: The patient is from: Home              Anticipated d/c is to: Home               Anticipated d/c date is: 1 day              Patient currently is not medically stable to d/c.           Subjective:   He said he just had a coughing spell this morning.  His breathing is labored when he gets around.  Even though this happens at home, he thinks it's a little worse.  Chart review shows that patient walked to the bathroom without his oxygen on and oxygen saturation dropped into the 70s.  Oxygen saturation improved to 94% after he was placed on 3 L of oxygen. His nurse was at the bedside  Objective:    Vitals:   11/11/19 0512 11/11/19 0741 11/11/19 0751 11/11/19 1115  BP: 114/80 127/75  139/83  Pulse: 63 61  63  Resp: 18 18    Temp: 97.9 F (36.6 C) 97.6 F (36.4 C)  97.7 F (36.5 C)  TempSrc: Oral Oral  Oral  SpO2: 96% 98% 96% 94%  Weight: 50.4 kg     Height:       No data found.   Intake/Output Summary (Last 24 hours) at 11/11/2019 1300 Last data filed at 11/11/2019 0541 Gross per 24 hour  Intake 1639.04 ml  Output 2000 ml  Net -360.96 ml   Filed Weights   11/09/19 0902 11/09/19 1848 11/11/19 0512  Weight: 55.3 kg 50.6 kg 50.4 kg    Exam:  GEN: NAD SKIN: No rash EYES: EOMI ENT: MMM CV: RRR PULM: Decreased air entry bilaterally, bilateral expiratory wheezing, no rales heard ABD: soft, ND, NT, +BS CNS: AAO x 3, non focal EXT: No edema or tenderness    Data Reviewed:   I have personally reviewed following labs and imaging studies:  Labs: Labs show the following:   Basic Metabolic Panel: Recent Labs  Lab 11/09/19 0906 11/10/19 0500  NA 142 143  K 3.7 3.8  CL 100 104  CO2 29 30  GLUCOSE 160* 112*  BUN 18 24*  CREATININE 1.37* 1.19  CALCIUM 8.9 8.3*   GFR Estimated Creatinine Clearance: 48.8 mL/min (by C-G formula based on SCr of 1.19 mg/dL). Liver Function Tests: No results for input(s): AST, ALT, ALKPHOS, BILITOT, PROT, ALBUMIN in the last 168 hours. No results for input(s): LIPASE, AMYLASE in the last 168 hours. No  results for input(s): AMMONIA in the last 168 hours. Coagulation profile No results for input(s): INR, PROTIME in the last 168 hours.  CBC: Recent Labs  Lab 11/09/19 0906 11/10/19 0500  WBC 7.6 9.4  NEUTROABS 5.0  --   HGB 13.7 11.3*  HCT 40.0 34.2*  MCV 91.5 95.8  PLT 352 274   Cardiac Enzymes:  No results for input(s): CKTOTAL, CKMB, CKMBINDEX, TROPONINI in the last 168 hours. BNP (last 3 results) No results for input(s): PROBNP in the last 8760 hours. CBG: No results for input(s): GLUCAP in the last 168 hours. D-Dimer: No results for input(s): DDIMER in the last 72 hours. Hgb A1c: No results for input(s): HGBA1C in the last 72 hours. Lipid Profile: No results for input(s): CHOL, HDL, LDLCALC, TRIG, CHOLHDL, LDLDIRECT in the last 72 hours. Thyroid function studies: No results for input(s): TSH, T4TOTAL, T3FREE, THYROIDAB in the last 72 hours.  Invalid input(s): FREET3 Anemia work up: No results for input(s): VITAMINB12, FOLATE, FERRITIN, TIBC, IRON, RETICCTPCT in the last 72 hours. Sepsis Labs: Recent Labs  Lab 11/09/19 0906 11/10/19 0500  WBC 7.6 9.4    Microbiology Recent Results (from the past 240 hour(s))  SARS Coronavirus 2 by RT PCR (hospital order, performed in [facility] hospital lab) Nasopharyngeal Nasopharyngeal Swab     Status: None   Collection Time: 11/09/19  9:06 AM   Specimen: Nasopharyngeal Swab  Result Value Ref Range Status   SARS Coronavirus 2 NEGATIVE NEGATIVE Final    Comment: (NOTE) SARS-CoV-2 target nucleic acids are NOT DETECTED.  The SARS-CoV-2 RNA is generally detectable in upper and lower respiratory specimens during the acute phase of infection. The lowest concentration of SARS-CoV-2 viral copies this assay can detect is 250 copies / mL. A negative result does not preclude SARS-CoV-2 infection and should not be used as the sole basis for treatment or other patient management decisions.  A negative result may occur with improper  specimen collection / handling, submission of specimen other than nasopharyngeal swab, presence of viral mutation(s) within the areas targeted by this assay, and inadequate number of viral copies (<250 copies / mL). A negative result must be combined with clinical observations, patient history, and epidemiological information.  Fact Sheet for Patients:   StrictlyIdeas.no  Fact Sheet for Healthcare Providers: BankingDealers.co.za  This test is not yet approved or  cleared by the [place] FDA and has been authorized for detection and/or diagnosis of SARS-CoV-2 by FDA under an Emergency Use Authorization (EUA).  This EUA will remain in effect (meaning this test can be used) for the duration of the COVID-19 declaration under Section 564(b)(1) of the Act, 21 U.S.C. section 360bbb-3(b)(1), unless the authorization is terminated or revoked sooner.  Performed at [facility], [address] [place], [place] [address]     Procedures and diagnostic studies:  CT ANGIO CHEST PE W OR WO CONTRAST  Result Date: 11/09/2019 CLINICAL DATA:  Respiratory distress and hypoxia EXAM: CT ANGIOGRAPHY CHEST WITH CONTRAST TECHNIQUE: Multidetector CT imaging of the chest was performed using the standard protocol during bolus administration of intravenous contrast. Multiplanar CT image reconstructions and MIPs were obtained to evaluate the vascular anatomy. CONTRAST:  55mL OMNIPAQUE IOHEXOL 350 MG/ML SOLN COMPARISON:  Chest x-ray from earlier in the same day. FINDINGS: Cardiovascular: Thoracic aorta demonstrates atherosclerotic calcifications similar to that seen on prior examination. No aneurysmal dilatation or dissection is noted. Pulmonary artery is well visualized with a normal branching pattern. No filling defects are identified to suggest pulmonary emboli. Right innominate vein is significantly narrowed with multiple collaterals in the chest wall  and mediastinum identified. Mediastinum/Nodes: Thoracic inlet is within normal limits. No hilar or mediastinal adenopathy is noted. The esophagus is within normal limits. Lungs/Pleura: Lungs again demonstrate diffuse emphysematous changes. Stable superior segment right lower lobe nodule is seen. Stable right upper lobe nodule is  noted on image number 30 of series 6. Nodular density in the left upper lobe is also stable on image number 27 of series 6. There is however a new pleural based density identified on the left which measures approximately 4.2 x 2.0 cm. This corresponds to the changes seen on recent plain film examination and given its acute nature is most consistent with focal pneumonia and likely some associated loculated fluid. Persistent changes in the left base are noted with loculated pleural fluid similar to that seen on the prior exam. Some associated atelectatic changes are noted. Upper Abdomen: Chronic pancreatitis changes are seen. No other focal abnormality is noted. Musculoskeletal: Degenerative changes of the thoracic spine are noted. No acute rib abnormality is seen. Review of the MIP images confirms the above findings. IMPRESSION: No evidence of pulmonary emboli. New pleural based density in the medial aspect of the left upper lobe posteriorly. This corresponds to the chest x-ray findings and given its acute nature likely represents focal pneumonia. Short-term follow-up is recommended to assess for resolution following appropriate therapy. Scattered bilateral parenchymal nodules within the lungs stable from the prior exams. Stable severe changes of COPD. Stable loculated fluid collection in the left base Electronically Signed   By: Alcide Clever M.D.   On: 11/09/2019 15:05               LOS: 2 days   Hasel Janish  [facility]     11/11/2019, 1:00 PM

## 2019-11-11 NOTE — Progress Notes (Signed)
Pt ambulated to bathroom while brother in room. Pt removed O2 to ambulate. Received call for assistance from patient's brother who stated pt was feeling dizzy and weak. Pt visibly short of breath with labored breathing and audible wheezing. O2 sats 70% RA. O2 reapplied at 4L. Pt recovered after 10 minutes to 94% 3L. Pt instructed to refrain from ambulation without O2. Verbalized understanding.

## 2019-11-11 NOTE — Plan of Care (Signed)
  Problem: Health Behavior/Discharge Planning: Goal: Ability to manage health-related needs will improve Outcome: Progressing   Problem: Clinical Measurements: Goal: Will remain free from infection Outcome: Progressing Goal: Respiratory complications will improve Outcome: Progressing   Problem: Pain Managment: Goal: General experience of comfort will improve Outcome: Progressing   Problem: Safety: Goal: Ability to remain free from injury will improve Outcome: Progressing   

## 2019-11-12 MED ORDER — AMOXICILLIN-POT CLAVULANATE 875-125 MG PO TABS
1.0000 | ORAL_TABLET | Freq: Two times a day (BID) | ORAL | 0 refills | Status: DC
Start: 2019-11-12 — End: 2019-11-16

## 2019-11-12 MED ORDER — SERTRALINE HCL 100 MG PO TABS: 100.0000 mg | ORAL_TABLET | Freq: Every day | ORAL | Status: AC

## 2019-11-12 NOTE — Progress Notes (Signed)
Patient educated on discharge instructions and verbalized on discharge instructions. Patients sister will bring home 02 and drive pt home.

## 2019-11-12 NOTE — Plan of Care (Signed)
  Problem: Health Behavior/Discharge Planning: Goal: Ability to manage health-related needs will improve Outcome: Progressing   Problem: Clinical Measurements: Goal: Respiratory complications will improve Outcome: Progressing   Problem: Pain Managment: Goal: General experience of comfort will improve Outcome: Progressing   Problem: Safety: Goal: Ability to remain free from injury will improve Outcome: Progressing   

## 2019-11-12 NOTE — Discharge Summary (Signed)
Physician Discharge Summary  Austin Blevins GBM:[id number] DOB: 1961/03/23 DOA: 11/09/2019  PCP: [name], NP  Admit date: 11/09/2019 Discharge date: 11/12/2019  Discharge disposition: Home with home health care   Recommendations for Outpatient Follow-Up:   Outpatient follow-up with PCP in 1 week Referral has been made to palliative care team for outpatient follow-up   Discharge Diagnosis:   Principal Problem:   CAP (community acquired pneumonia) Active Problems:   Chronic respiratory failure with hypoxia (HCC)   Stage 3 severe COPD by GOLD classification (HCC)   Nicotine dependence, cigarettes, w unsp disorders   Depression, major, recurrent, moderate (HCC)   Acute on chronic respiratory failure with hypercapnia (HCC)    Discharge Condition: Stable.  Diet recommendation:  Diet Order            Diet - low sodium heart healthy           DIET DYS 3 Room service appropriate? Yes; Fluid consistency: Thin  Diet effective now                   Code Status: Full Code     Hospital Course:   Austin Blevins is a 59 y.o. male with medical history significant forhydrocephalus with known VP shunt, severe COPD with chronic respiratory failure on home O2 at 3 L, anxiety and restless leg syndrome who presented to the emergency roomvia EMS for evaluation of respiratory distress.  His oxygen saturation was 78% on room air so he was placed on oxygen via nonrebreathing mask and given IV Solu-Medrol and DuoNeb by EMS prior to transfer to the emergency room.   In the ED, he required BiPAP for acute hypoxic and hypercapnic respiratory failure. Work-up also revealed community-acquired pneumonia and COPD exacerbation.  He was treated with empiric IV antibiotics, steroids and bronchodilators.  His condition improved and he is deemed stable for discharge home.  Patient has been advised to keep on his oxygen especially with ambulation.  Discharge plan was discussed with the  patient and his sister who is the healthcare power of attorney.  She said they needed more help at home because it's becoming increasingly difficult for her to take care of her brother.  She said they have palliative care at home but did not get enough help and they were contemplating hospice.  Outpatient follow-up with palliative care for discussion of hospice was recommended.      Discharge Exam:   Vitals:   11/12/19 0548 11/12/19 0908  BP: 114/75 112/72  Pulse: (!) 56 65  Resp:  16  Temp: 97.6 F (36.4 C) 98.6 F (37 C)  SpO2: 98% 98%   Vitals:   11/11/19 2032 11/11/19 2044 11/12/19 0548 11/12/19 0908  BP: 113/75  114/75 112/72  Pulse: 74  (!) 56 65  Resp:    16  Temp: 97.8 F (36.6 C)  97.6 F (36.4 C) 98.6 F (37 C)  TempSrc: Oral     SpO2: 98% 95% 98% 98%  Weight:   52.3 kg   Height:         GEN: NAD SKIN: No rash EYES: EOMI ENT: MMM CV: RRR PULM: CTA B ABD: soft, ND, NT, +BS CNS: AAO x 3, non focal EXT: No edema or tenderness   The results of significant diagnostics from this hospitalization (including imaging, microbiology, ancillary and laboratory) are listed below for reference.     Procedures and Diagnostic Studies:   CT ANGIO CHEST PE W OR  WO CONTRAST  Result Date: 11/09/2019 CLINICAL DATA:  Respiratory distress and hypoxia EXAM: CT ANGIOGRAPHY CHEST WITH CONTRAST TECHNIQUE: Multidetector CT imaging of the chest was performed using the standard protocol during bolus administration of intravenous contrast. Multiplanar CT image reconstructions and MIPs were obtained to evaluate the vascular anatomy. CONTRAST:  69mL OMNIPAQUE IOHEXOL 350 MG/ML SOLN COMPARISON:  Chest x-ray from earlier in the same day. FINDINGS: Cardiovascular: Thoracic aorta demonstrates atherosclerotic calcifications similar to that seen on prior examination. No aneurysmal dilatation or dissection is noted. Pulmonary artery is well visualized with a normal branching pattern. No filling  defects are identified to suggest pulmonary emboli. Right innominate vein is significantly narrowed with multiple collaterals in the chest wall and mediastinum identified. Mediastinum/Nodes: Thoracic inlet is within normal limits. No hilar or mediastinal adenopathy is noted. The esophagus is within normal limits. Lungs/Pleura: Lungs again demonstrate diffuse emphysematous changes. Stable superior segment right lower lobe nodule is seen. Stable right upper lobe nodule is noted on image number 30 of series 6. Nodular density in the left upper lobe is also stable on image number 27 of series 6. There is however a new pleural based density identified on the left which measures approximately 4.2 x 2.0 cm. This corresponds to the changes seen on recent plain film examination and given its acute nature is most consistent with focal pneumonia and likely some associated loculated fluid. Persistent changes in the left base are noted with loculated pleural fluid similar to that seen on the prior exam. Some associated atelectatic changes are noted. Upper Abdomen: Chronic pancreatitis changes are seen. No other focal abnormality is noted. Musculoskeletal: Degenerative changes of the thoracic spine are noted. No acute rib abnormality is seen. Review of the MIP images confirms the above findings. IMPRESSION: No evidence of pulmonary emboli. New pleural based density in the medial aspect of the left upper lobe posteriorly. This corresponds to the chest x-ray findings and given its acute nature likely represents focal pneumonia. Short-term follow-up is recommended to assess for resolution following appropriate therapy. Scattered bilateral parenchymal nodules within the lungs stable from the prior exams. Stable severe changes of COPD. Stable loculated fluid collection in the left base Electronically Signed   By: Alcide Clever M.D.   On: 11/09/2019 15:05   DG Chest Portable 1 View  Result Date: 11/09/2019 CLINICAL DATA:  Hypoxia  EXAM: PORTABLE CHEST 1 VIEW COMPARISON:  09/26/2019 FINDINGS: RIGHT-sided IJ catheter remains in place with unchanged position. Signs of pulmonary emphysema as before. Increasing opacity within emphysematous changes in the LEFT upper lobe as compared to the previous study. Persistent LEFT lower lobe opacity and effusion. Cardiomediastinal contours are stable. Visualized skeletal structures without acute process. IMPRESSION: 1. Increasing opacity within emphysematous changes in the LEFT upper lobe as compared to the previous study. Findings may represent developing pneumonia. Asymmetric pulmonary edema could have a similar appearance. Patchy opacities elsewhere are stable based on comparison with March 11th radiograph. 2. Persistent LEFT lower lobe opacity and effusion. Electronically Signed   By: Donzetta Kohut M.D.   On: 11/09/2019 09:31     Labs:   Basic Metabolic Panel: Recent Labs  Lab 11/09/19 0906 11/10/19 0500  NA 142 143  K 3.7 3.8  CL 100 104  CO2 29 30  GLUCOSE 160* 112*  BUN 18 24*  CREATININE 1.37* 1.19  CALCIUM 8.9 8.3*   GFR Estimated Creatinine Clearance: 50.7 mL/min (by C-G formula based on SCr of 1.19 mg/dL). Liver Function Tests: No results for  input(s): AST, ALT, ALKPHOS, BILITOT, PROT, ALBUMIN in the last 168 hours. No results for input(s): LIPASE, AMYLASE in the last 168 hours. No results for input(s): AMMONIA in the last 168 hours. Coagulation profile No results for input(s): INR, PROTIME in the last 168 hours.  CBC: Recent Labs  Lab 11/09/19 0906 11/10/19 0500  WBC 7.6 9.4  NEUTROABS 5.0  --   HGB 13.7 11.3*  HCT 40.0 34.2*  MCV 91.5 95.8  PLT 352 274   Cardiac Enzymes: No results for input(s): CKTOTAL, CKMB, CKMBINDEX, TROPONINI in the last 168 hours. BNP: Invalid input(s): POCBNP CBG: No results for input(s): GLUCAP in the last 168 hours. D-Dimer No results for input(s): DDIMER in the last 72 hours. Hgb A1c No results for input(s): HGBA1C in  the last 72 hours. Lipid Profile No results for input(s): CHOL, HDL, LDLCALC, TRIG, CHOLHDL, LDLDIRECT in the last 72 hours. Thyroid function studies No results for input(s): TSH, T4TOTAL, T3FREE, THYROIDAB in the last 72 hours.  Invalid input(s): FREET3 Anemia work up No results for input(s): VITAMINB12, FOLATE, FERRITIN, TIBC, IRON, RETICCTPCT in the last 72 hours. Microbiology Recent Results (from the past 240 hour(s))  SARS Coronavirus 2 by RT PCR (hospital order, performed in [facility] hospital lab) Nasopharyngeal Nasopharyngeal Swab     Status: None   Collection Time: 11/09/19  9:06 AM   Specimen: Nasopharyngeal Swab  Result Value Ref Range Status   SARS Coronavirus 2 NEGATIVE NEGATIVE Final    Comment: (NOTE) SARS-CoV-2 target nucleic acids are NOT DETECTED.  The SARS-CoV-2 RNA is generally detectable in upper and lower respiratory specimens during the acute phase of infection. The lowest concentration of SARS-CoV-2 viral copies this assay can detect is 250 copies / mL. A negative result does not preclude SARS-CoV-2 infection and should not be used as the sole basis for treatment or other patient management decisions.  A negative result may occur with improper specimen collection / handling, submission of specimen other than nasopharyngeal swab, presence of viral mutation(s) within the areas targeted by this assay, and inadequate number of viral copies (<250 copies / mL). A negative result must be combined with clinical observations, patient history, and epidemiological information.  Fact Sheet for Patients:   StrictlyIdeas.no  Fact Sheet for Healthcare Providers: BankingDealers.co.za  This test is not yet approved or  cleared by the [place] FDA and has been authorized for detection and/or diagnosis of SARS-CoV-2 by FDA under an Emergency Use Authorization (EUA).  This EUA will remain in effect (meaning this test can  be used) for the duration of the COVID-19 declaration under Section 564(b)(1) of the Act, 21 U.S.C. section 360bbb-3(b)(1), unless the authorization is terminated or revoked sooner.  Performed at [facility], [address], [place], [place] [address]      Discharge Instructions:   Discharge Instructions    Amb Referral to Palliative Care   Complete by: As directed    Diet - low sodium heart healthy   Complete by: As directed    Face-to-face encounter (required for Medicare/Medicaid patients)   Complete by: As directed    I Rolin Schult certify that this patient is under my care and that I, or a nurse practitioner or physician's assistant working with me, had a face-to-face encounter that meets the physician face-to-face encounter requirements with this patient on 11/12/2019. The encounter with the patient was in whole, or in part for the following medical condition(s) which is the primary reason for home health care (  List medical condition): Advanced COPD, DEBILITY   The encounter with the patient was in whole, or in part, for the following medical condition, which is the primary reason for home health care: Advanced COPD, DEBILITY   I certify that, based on my findings, the following services are medically necessary home health services:  Physical therapy Nursing     Reason for Medically Necessary Home Health Services:  Therapy- Investment banker, operational, Patent examiner Skilled Nursing- Skilled Assessment/Observation     My clinical findings support the need for the above services: Shortness of breath with activity   Further, I certify that my clinical findings support that this patient is homebound due to: Shortness of Breath with activity   Home Health   Complete by: As directed    To provide the following care/treatments:  PT RN     Increase activity slowly   Complete by: As directed      Allergies as of 11/12/2019      Reactions   Acetaminophen Other  (See Comments)   Syncope   Duloxetine    Vivid dreams and hallucinations   Percocet [oxycodone-acetaminophen]    Only allergic to tylenol.      Medication List    TAKE these medications   albuterol (2.5 MG/3ML) 0.083% nebulizer solution Commonly known as: PROVENTIL USE ONE VIAL IN NEBULIZER EVERY SIX HOURS AS NEEDED FOR WHEEZING OR SHORTNESS OF BREATH   albuterol 108 (90 Base) MCG/ACT inhaler Commonly known as: Ventolin HFA Inhale 2 puffs into the lungs every 4 (four) hours as needed for wheezing or shortness of breath.   ALPRAZolam 0.5 MG tablet Commonly known as: XANAX Take 1 tablet by mouth as needed.   amoxicillin-clavulanate 875-125 MG tablet Commonly known as: Augmentin Take 1 tablet by mouth 2 (two) times daily for 4 days.   busPIRone 10 MG tablet Commonly known as: BUSPAR TAKE ONE TABLET TWICE DAILY   DULoxetine 30 MG capsule Commonly known as: CYMBALTA Take 30 mg by mouth 2 (two) times daily.   metoprolol tartrate 25 MG tablet Commonly known as: LOPRESSOR Take 0.5 tablets (12.5 mg total) by mouth 2 (two) times daily.   montelukast 10 MG tablet Commonly known as: SINGULAIR TAKE ONE TABLET AT BEDTIME   omeprazole 20 MG capsule Commonly known as: PRILOSEC TAKE 1 CAPSULE BY MOUTH EVERY DAY   predniSONE 10 MG tablet Commonly known as: DELTASONE Take 10 mg by mouth daily with breakfast.   rOPINIRole 0.5 MG tablet Commonly known as: REQUIP TAKE ONE TABLET EVERY MORNING AND TAKE THREE TABLETS AT BEDTIME AS NEEDED FOR RESTLESS LEG SYNDROME   sertraline 50 MG tablet Commonly known as: ZOLOFT Take by mouth. TAKE 1 TABLET EVERY DAY ALONG WITH 100MG  TABLET TO EQUAL 150MG  TOTAL DAILY What changed: Another medication with the same name was changed. Make sure you understand how and when to take each.   sertraline 100 MG tablet Commonly known as: ZOLOFT Take 1 tablet (100 mg total) by mouth daily. TAKE 1 TABLET EVERY DAY ALONG WITH 50MG  TABLET TO EQUAL 150MG   TOTAL DAILY What changed:   how much to take  additional instructions   Spiriva HandiHaler 18 MCG inhalation capsule Generic drug: tiotropium INHALE CONTENTS OF 1 CAPSULE AS DIRECTEDEVERY DAY WITH HANDIHALER         Time coordinating discharge: 28 minutes  Signed:  Adaliz Dobis  [facility] 11/12/2019, 12:56 PM

## 2019-11-13 ENCOUNTER — Telehealth: Payer: Self-pay

## 2019-11-13 NOTE — Telephone Encounter (Signed)
This nurse attempted a TCM call with patient after hospital discharge. Mobile number went straight to voicemail, which was full. Called home number and left message

## 2019-11-14 ENCOUNTER — Telehealth: Payer: Self-pay

## 2019-11-14 NOTE — Telephone Encounter (Signed)
Transition Care Management Follow-up Telephone Call  Date of discharge and from where: 11/12/2019 [facility]  How have you been since you were released from the hospital? Doing ok, just tired  Any questions or concerns? No   Items Reviewed:  Did the pt receive and understand the discharge instructions provided? Yes   Medications obtained and verified? Yes   Any new allergies since your discharge? No   Dietary orders reviewed? Yes  Do you have support at home? Yes   Functional Questionnaire: (I = Independent and D = Dependent) ADLs: I  Bathing/Dressing- I  Meal Prep- D  Eating- I  Maintaining continence- I  Transferring/Ambulation- I  Managing Meds- I  Follow up appointments reviewed:   PCP Hospital f/u appt confirmed? Yes  Scheduled to see Austin Dials NP on 11/15/2019 @ 1:00.  Are transportation arrangements needed? No   If their condition worsens, is the pt aware to call PCP or go to the Emergency Dept.? Yes  Was the patient provided with contact information for the PCP's office or ED? Yes  Was to pt encouraged to call back with questions or concerns? Yes

## 2019-11-15 ENCOUNTER — Other Ambulatory Visit: Payer: Self-pay | Admitting: Nurse Practitioner

## 2019-11-15 ENCOUNTER — Other Ambulatory Visit: Payer: Self-pay

## 2019-11-15 ENCOUNTER — Encounter: Payer: Self-pay | Admitting: Nurse Practitioner

## 2019-11-15 ENCOUNTER — Telehealth: Payer: Self-pay | Admitting: Nurse Practitioner

## 2019-11-15 ENCOUNTER — Ambulatory Visit (INDEPENDENT_AMBULATORY_CARE_PROVIDER_SITE_OTHER): Payer: Medicare Other | Admitting: Nurse Practitioner

## 2019-11-15 ENCOUNTER — Telehealth: Payer: Self-pay

## 2019-11-15 ENCOUNTER — Ambulatory Visit (INDEPENDENT_AMBULATORY_CARE_PROVIDER_SITE_OTHER): Payer: Medicare Other | Admitting: General Practice

## 2019-11-15 VITALS — BP 117/72 | HR 85 | Temp 97.3°F | Wt 114.6 lb

## 2019-11-15 DIAGNOSIS — Z8781 Personal history of (healed) traumatic fracture: Secondary | ICD-10-CM

## 2019-11-15 DIAGNOSIS — F331 Major depressive disorder, recurrent, moderate: Secondary | ICD-10-CM | POA: Diagnosis not present

## 2019-11-15 DIAGNOSIS — F4321 Adjustment disorder with depressed mood: Secondary | ICD-10-CM

## 2019-11-15 DIAGNOSIS — F419 Anxiety disorder, unspecified: Secondary | ICD-10-CM

## 2019-11-15 DIAGNOSIS — J9611 Chronic respiratory failure with hypoxia: Secondary | ICD-10-CM

## 2019-11-15 DIAGNOSIS — E43 Unspecified severe protein-calorie malnutrition: Secondary | ICD-10-CM

## 2019-11-15 DIAGNOSIS — Z7409 Other reduced mobility: Secondary | ICD-10-CM | POA: Insufficient documentation

## 2019-11-15 DIAGNOSIS — J189 Pneumonia, unspecified organism: Secondary | ICD-10-CM | POA: Diagnosis not present

## 2019-11-15 DIAGNOSIS — J441 Chronic obstructive pulmonary disease with (acute) exacerbation: Secondary | ICD-10-CM

## 2019-11-15 DIAGNOSIS — J449 Chronic obstructive pulmonary disease, unspecified: Secondary | ICD-10-CM

## 2019-11-15 DIAGNOSIS — Z789 Other specified health status: Secondary | ICD-10-CM

## 2019-11-15 DIAGNOSIS — Z7189 Other specified counseling: Secondary | ICD-10-CM

## 2019-11-15 DIAGNOSIS — F17219 Nicotine dependence, cigarettes, with unspecified nicotine-induced disorders: Secondary | ICD-10-CM

## 2019-11-15 MED ORDER — ALBUTEROL SULFATE HFA 108 (90 BASE) MCG/ACT IN AERS: 2.0000 | INHALATION_SPRAY | RESPIRATORY_TRACT | 5 refills | Status: AC | PRN

## 2019-11-15 MED ORDER — OXYCODONE HCL 5 MG PO TABS: 5.0000 mg | ORAL_TABLET | Freq: Three times a day (TID) | ORAL | 0 refills | Status: AC | PRN

## 2019-11-15 NOTE — Telephone Encounter (Signed)
May provide verbal orders to [facility] for this.  Thank you.

## 2019-11-15 NOTE — Assessment & Plan Note (Signed)
I have recommended complete cessation of tobacco use. I have discussed various options available for assistance with tobacco cessation including over the counter methods (Nicotine gum, patch and lozenges). We also discussed prescription options (Chantix, Nicotine Inhaler / Nasal Spray). The patient is not interested in pursuing any prescription tobacco cessation options at this time.  

## 2019-11-15 NOTE — Telephone Encounter (Signed)
Copied from CRM [id number]. Topic: General - Other >> Nov 15, 2019 11:10 AM Dalphine Handing A wrote: Larita Fife with [facility] called for verbal orders Home health nursing 2w2 and 1w7 and 3prn visits.best contact for Larita Fife  [phone]  option 2

## 2019-11-15 NOTE — Telephone Encounter (Signed)
Copied from CRM [id number]. Topic: General - Other >> Nov 15, 2019  3:11 PM Wyonia Hough E wrote: Reason for CRM: Hospice dropped off form for attending provider to fill out this morning / please fax back asap once Jolene and Dr. Laural Benes has signed to [phone]

## 2019-11-15 NOTE — Patient Instructions (Signed)
Chronic Obstructive Pulmonary Disease Chronic obstructive pulmonary disease (COPD) is a long-term (chronic) lung problem. When you have COPD, it is hard for air to get in and out of your lungs. Usually the condition gets worse over time, and your lungs will never return to normal. There are things you can do to keep yourself as healthy as possible.  Your doctor may treat your condition with: ? Medicines. ? Oxygen. ? Lung surgery.  Your doctor may also recommend: ? Rehabilitation. This includes steps to make your body work better. It may involve a team of specialists. ? Quitting smoking, if you smoke. ? Exercise and changes to your diet. ? Comfort measures (palliative care). Follow these instructions at home: Medicines  Take over-the-counter and prescription medicines only as told by your doctor.  Talk to your doctor before taking any cough or allergy medicines. You may need to avoid medicines that cause your lungs to be dry. Lifestyle  If you smoke, stop. Smoking makes the problem worse. If you need help quitting, ask your doctor.  Avoid being around things that make your breathing worse. This may include smoke, chemicals, and fumes.  Stay active, but remember to rest as well.  Learn and use tips on how to relax.  Make sure you get enough sleep. Most adults need at least 7 hours of sleep every night.  Eat healthy foods. Eat smaller meals more often. Rest before meals. Controlled breathing Learn and use tips on how to control your breathing as told by your doctor. Try:  Breathing in (inhaling) through your nose for 1 second. Then, pucker your lips and breath out (exhale) through your lips for 2 seconds.  Putting one hand on your belly (abdomen). Breathe in slowly through your nose for 1 second. Your hand on your belly should move out. Pucker your lips and breathe out slowly through your lips. Your hand on your belly should move in as you breathe out.  Controlled coughing Learn  and use controlled coughing to clear mucus from your lungs. Follow these steps: 1. Lean your head a little forward. 2. Breathe in deeply. 3. Try to hold your breath for 3 seconds. 4. Keep your mouth slightly open while coughing 2 times. 5. Spit any mucus out into a tissue. 6. Rest and do the steps again 1 or 2 times as needed. General instructions  Make sure you get all the shots (vaccines) that your doctor recommends. Ask your doctor about a flu shot and a pneumonia shot.  Use oxygen therapy and pulmonary rehabilitation if told by your doctor. If you need home oxygen therapy, ask your doctor if you should buy a tool to measure your oxygen level (oximeter).  Make a COPD action plan with your doctor. This helps you to know what to do if you feel worse than usual.  Manage any other conditions you have as told by your doctor.  Avoid going outside when it is very hot, cold, or humid.  Avoid people who have a sickness you can catch (contagious).  Keep all follow-up visits as told by your doctor. This is important. Contact a doctor if:  You cough up more mucus than usual.  There is a change in the color or thickness of the mucus.  It is harder to breathe than usual.  Your breathing is faster than usual.  You have trouble sleeping.  You need to use your medicines more often than usual.  You have trouble doing your normal activities such as getting dressed   or walking around the house. Get help right away if:  You have shortness of breath while resting.  You have shortness of breath that stops you from: ? Being able to talk. ? Doing normal activities.  Your chest hurts for longer than 5 minutes.  Your skin color is more blue than usual.  Your pulse oximeter shows that you have low oxygen for longer than 5 minutes.  You have a fever.  You feel too tired to breathe normally. Summary  Chronic obstructive pulmonary disease (COPD) is a long-term lung problem.  The way your  lungs work will never return to normal. Usually the condition gets worse over time. There are things you can do to keep yourself as healthy as possible.  Take over-the-counter and prescription medicines only as told by your doctor.  If you smoke, stop. Smoking makes the problem worse. This information is not intended to replace advice given to you by your health care provider. Make sure you discuss any questions you have with your health care provider. Document Revised: 04/16/2017 Document Reviewed: 06/08/2016 Elsevier Patient Education  2020 Elsevier Inc.  

## 2019-11-15 NOTE — Assessment & Plan Note (Signed)
Chronic, ongoing, severe with O2 dependence. Continue current medication and collaboration with pulmonary, palliative, and CCM team.  Adjust regimen as needed.  Recommend complete cessation smoking.  Referral to hospice placed.  Return to office in 6 weeks.  Recommend he schedule f/u ASAP with Dr. Meredeth Ide.

## 2019-11-15 NOTE — Assessment & Plan Note (Signed)
Chronic, ongoing with O2 use daily.  Continue current medication regimen as prescribed by pulmonary and adjust as needed + collaboration with pulmonary, CCM, and palliative team.  Recommend complete cessation smoking.  Hospice referral placed today after discussion with patient.  Return to office in 6 weeks.

## 2019-11-15 NOTE — Assessment & Plan Note (Addendum)
Related to severe COPD and chronic respiratory failure.  Will work on obtaining a power wheelchair for patient as would benefit from this for quality of life purposes and to enhance ADLs and mobility.

## 2019-11-15 NOTE — Chronic Care Management (AMB) (Signed)
Chronic Care Management   Follow Up Note   11/15/2019 Name: Austin Blevins MRN: [id number] DOB: 07/12/1960  Referred by: [name], NP Reason for referral : Chronic Care Management (Follow up post discharge from the hospital on 6/27/RNCM Chronic Disease Management and Care Coordination Needs)   Austin Blevins is a 59 y.o. year old male who is a primary care patient of [name], [name], NP. The CCM team was consulted for assistance with chronic disease management and care coordination needs.    Review of patient status, including review of consultants reports, relevant laboratory and other test results, and collaboration with appropriate care team members and the patient's provider was performed as part of comprehensive patient evaluation and provision of chronic care management services.    SDOH (Social Determinants of Health) assessments performed: Yes See Care Plan activities for detailed interventions related to [facility])     Outpatient Encounter Medications as of 11/15/2019  Medication Sig Note  . albuterol (PROVENTIL) (2.5 MG/3ML) 0.083% nebulizer solution USE ONE VIAL IN NEBULIZER EVERY SIX HOURS AS NEEDED FOR WHEEZING OR SHORTNESS OF BREATH   . albuterol (VENTOLIN HFA) 108 (90 Base) MCG/ACT inhaler Inhale 2 puffs into the lungs every 4 (four) hours as needed for wheezing or shortness of breath.   . ALPRAZolam (XANAX) 0.5 MG tablet Take 1 tablet by mouth as needed. (Patient not taking: Reported on 11/15/2019)   . amoxicillin-clavulanate (AUGMENTIN) 875-125 MG tablet Take 1 tablet by mouth 2 (two) times daily for 4 days.   . busPIRone (BUSPAR) 10 MG tablet TAKE ONE TABLET TWICE DAILY   . montelukast (SINGULAIR) 10 MG tablet TAKE ONE TABLET AT BEDTIME   . omeprazole (PRILOSEC) 20 MG capsule TAKE 1 CAPSULE BY MOUTH EVERY DAY   . predniSONE (DELTASONE) 10 MG tablet Take 10 mg by mouth daily with breakfast.   . rOPINIRole (REQUIP) 0.5 MG tablet TAKE ONE TABLET EVERY MORNING AND TAKE  THREE TABLETS AT BEDTIME AS NEEDED FOR RESTLESS LEG SYNDROME   . sertraline (ZOLOFT) 100 MG tablet Take 1 tablet (100 mg total) by mouth daily. TAKE 1 TABLET EVERY DAY ALONG WITH 50MG  TABLET TO EQUAL 150MG  TOTAL DAILY   . sertraline (ZOLOFT) 50 MG tablet Take by mouth. TAKE 1 TABLET EVERY DAY ALONG WITH 100MG  TABLET TO EQUAL 150MG  TOTAL DAILY   . SPIRIVA HANDIHALER 18 MCG inhalation capsule INHALE CONTENTS OF 1 CAPSULE AS DIRECTEDEVERY DAY WITH HANDIHALER   . [DISCONTINUED] albuterol (PROVENTIL HFA;VENTOLIN HFA) 108 (90 Base) MCG/ACT inhaler Inhale 2 puffs into the lungs every 4 (four) hours as needed for wheezing or shortness of breath. 12/13/2018: Using 4x day  . [DISCONTINUED] albuterol (VENTOLIN HFA) 108 (90 Base) MCG/ACT inhaler INHALE 2 PUFFS INTO THE LUNGS EVERY 4 HOURS AS NEEDED FOR WHEEZING OR SHORTNESS OF BREATH   . [DISCONTINUED] rOPINIRole (REQUIP) 0.5 MG tablet TAKE ONE TABLET BY MOUTH EVERY MORNING AND 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    No facility-administered encounter medications on file as of 11/15/2019.     Objective:  BP Readings from Last 3 Encounters:  11/15/19 117/72  11/12/19 112/72  09/18/19 129/79    Goals Addressed              This Visit's Progress   .  RN- COPD/Depression/Chronic Pancreatitis Management (pt-stated)        Current Barriers:  11/17/2019 Knowledge deficits related to basic COPD self care/management . Knowledge deficit related to Depression and Chronic pancreatitis .  Knowledge deficit related to importance of energy conservation  . Financial Barriers   Case Manager Clinical Goal(s):  Over the next 120 days patient will report using inhalers as prescribed including rinsing mouth after use  Over the next 120 days patient will report utilizing pursed lip breathing for shortness of breath  Over the next 120 days, patient will be able to verbalize understanding of COPD action plan and when to seek appropriate levels of medical  care  Over the next 120 days, patient will engage in lite exercise as tolerated to build/regain stamina and strength and reduce shortness of breath through activity tolerance  Over the next 120 days, patient will verbalize basic understanding of COPD disease process and self care activities  Over the next 90 days the patient will work with pulmonary provider to get a new nebulizer machine as the one he is using is one he got at a yard sale- completed  Over the next 90 days the patient with work with the oxygen company to get needed oxygen tanks. The patient now has an [employer] machine and was told that [employer], would not be supplier anymore. The patient is trying to confirm the information he needs so he will not be without his oxygen.  Over the next 120 days, the patient will work with the [facility] for effective management of depression and chronic pancreatitis   Interventions:   Provided patient with COPD action plan and reinforced importance of daily self assessment  Discussed Pulmonary Rehab and offered to assist with referral placement  Advised patient to engage in light exercise as tolerated 3-5 days a week- patient easily gets shob when ambulating.  The patient is very limited in ability to do activity. Spoke to pcp about this at last appointment last week. The patient verbalized he feels this is a part of his COPD progression.   Discussed with patient copd symptoms, the patient verbalized he is doing well physically but mentally he is having a hard time. His elderly mother was just told she has cancer and had a PET scan to determine if the cancer has spread. 11-15-2019: the patient is in the office today post discharge for COPD exacerbation. The patient verbalized he is felling better physically.  States his "wife" died three weeks ago from "the same thing" (COPD).  The patient is upset about her passing but is dealing with it okay. The patient is finding it harder to take care  of himself at home. States his sister is doing a great job but will discuss with [name] today her thoughts on Hospice coming in and working with him. Message sent to [name] with [facility]. [name] has reached out to the patient and he would like to talk to Amoret before agreeing to services.   Empathetic listening and allowed the patient to reflect on his health and recent loss of his wife. He does enjoy fishing and talked about how this gives him pleasure when he can go out with his uncle and friends. He doesn't do a lot of the reeling but he likes to bait the hooks for everyone. He wants to continue to do things he enjoys.   Patient reports his new medicare benefit has started, he is no longer solely Medicaid.   Supplied the patient with Vanilla ensure and coupons at today's visit - completed   Evaluation of the patients current condition: Incoming call from the patient's sister [name].  [name] expressed that she is concerned  about the patient as his oxygen levels are in the 70's and they come up some but not a lot. The patient is in bed and not getting up. She feels the patient has pneumonia. The patient does not want to go to the hospital because he fears they will "intubate" him. Education on the need to be evaluated at the hospital and that he could express his desire not to be intubated. The patients sister is really concerned and wanted to best know how to help the patient. Secure chat messaging with the pcp and CCM team. All agree the patient should be evaluated at the hospital. Message relayed to the patients sister [name]. Talked with the patients sister about tough decisions of what the patients wishes are and discussed the options of pallative/hospice care. The patient's sister is concerned and will try to talk the patient into going to the hospital.  The patient does continue to smoke. Empathetic listening and support given to the patients sister. The patients sister  will update the RNCM of any changes. Completed.  The patient is back to his baseline status.   Pharmacy referral for help with cough medicine and cost constraints.   LCSW referral for help with grief over recent news of his mother having cancer. PET scan today. The patient has a strong faith in God and says he has put it in his hands. 11-15-2019: the RNCM will let CCM team LCSW know about the patients wife passing for follow up and assistance as needed.   Review of smoking- the patient continues to smoke and is not interested in smoking cessation at this time.   Assessed the patients needs. The patient ask about the qualifiers for getting a scooter.  Encouraged the patient to talk to the pcp about the scooter. In basket message sent to [facility] for the 7 elements of requirements for scooter/power wheelchair. Will work with pcp for assistance as needed. Gave information on suppliers in [place] for scooters/power wheelchairs.   Patient Self Care Activities:  Takes medications as prescribed including inhalers  Practices and uses pursed lip breathing for shortness of breath recovery and prevention  Self assesses COPD action plan zone and makes appointment with provider if in the yellow zone for 48 hours without improvement.  Engages in light exercise 3-5 days a week  Utilizes infection prevention strategies to reduce risk of respiratory infection   Please see past updates related to this goal by clicking on the "Past Updates" button in the selected goal          Plan:   The care management team will reach out to the patient again over the next 30 to 60 days.    Alto Denver RN, MSN, CCM Community Care Coordinator [place]  [facility] Mobile: [phone]

## 2019-11-15 NOTE — Telephone Encounter (Signed)
Please call to make sure they received, as I just sent in.  Thanks:)

## 2019-11-15 NOTE — Assessment & Plan Note (Signed)
With chronic pain as a result + has underlying severe COPD.  PDMP reviewed, will refill Oxycodone for short burst as is seeing pain management tomorrow and appreciated their recommendations for ongoing pain management.  We have discussed at length risks of opioid use in severe COPD, he wishes to continue current medication regimen at this time.  Hospice referral placed.

## 2019-11-15 NOTE — Telephone Encounter (Signed)
Verbal orders provided to [facility]

## 2019-11-15 NOTE — Assessment & Plan Note (Signed)
Chronic, ongoing, continues to be exacerbated by his severe COPD and recent grieving. Continue Sertraline 150 MG daily and continue Buspar 10 MG BID, he reports these work well.  He denies SI/HI.   Return in 3 months, sooner if worsening mood.  Would avoid benzo at this time, as concern for benzo and opioid combination use in this patient with severe COPD.

## 2019-11-15 NOTE — Telephone Encounter (Signed)
-----   Message from Marlowe Sax sent at 11/15/2019  2:22 PM EDT -----  Ask and you shall receive: [facility] [facility] [employer] [phone], fax [phone], [address], [place] [address], [employer] does accept Medicare and Medicaid pay assignments   [employer] Medicare Supplier/Brent Steffanie Dunn sales person direct number [phone], fax number [phone] , [address] [place], [place] [address], He needs a physician or nurse practitioner to send him a prescription, demographic info and copy of the patient's insurance card. Medicare enrolled and may accept medicare assignment. Please check with the supplier if they accept medicare-approved amount before you get your prescription drugs, equipment or supplies from this supplier.   ----- Message ----- From: [name], NP Sent: 11/15/2019   2:01 PM EDT To: Pablo Ledger, CMA, Marlowe Sax  He would like a scooter/pov.  I have fully documented all we need in note, as I have a smart phrase with all Medicare needs.  Can we work on obtaining this through a Insurance claims handler company for him?

## 2019-11-15 NOTE — Assessment & Plan Note (Signed)
Acute and improving at this time, symptoms improving.  Had at length discussion today with patient on stages of COPD and goals of care.  Hospice referral placed.  Plan on return to office in 6 weeks and will repeat CXR.

## 2019-11-15 NOTE — Progress Notes (Addendum)
BP 117/72   Pulse 85   Temp (!) 97.3 F (36.3 C) (Oral)   Wt 114 lb 9.6 oz (52 kg)   SpO2 93%   BMI 17.42 kg/m    Subjective:    Patient ID: Austin Blevins, male    DOB: November 24, 1960, 59 y.o.   MRN: [id number]  HPI: Austin Blevins is a 59 y.o. male  Chief Complaint  Patient presents with  . Hospitalization Follow-up    11/09/19 - 11/12/19, dx with pneumonia per patient     This is an addendum to the original office visit note from 11/15/2019 to include power wheelchair.  Transition of Care Hospital Follow up.  Was admitted to hospital on 11/09/19 and discharged 11/12/19 due to CAP and acute on chronic respiratory failure in the setting of Stage 3 severe COPD.  He is being followed by palliative care outpatient and has had many goals of care conversations with provider.  Last hospitalization was in March for SOB.  He does endorse feeling somewhat better, but currently continues to have ongoing pain.  Was diagnosed in March with closed fracture of seventh thoracic vertebra, currently with routine healing -- has been taking Oxycodone 5 MG as needed but is out of refills.  He is aware of risks with opioid use and severe COPD, but wishes to continue if able as it offers comfort and ability to perform ADLs.  He is scheduled to see pain management tomorrow for initial visit.    "Mr. Onix Jumper Payneis a 59 y.o.malewith medical history significant forhydrocephalus with known VP shunt, severe COPD with chronic respiratory failure on home O2 at 3 L, anxiety and restless leg syndrome who presented to the emergency roomvia EMS for evaluation of respiratory distress. His oxygen saturation was 78% on room air so he was placed on oxygen via nonrebreathing mask and given IV Solu-Medrol and DuoNeb by EMS prior to transfer to the emergency room.  In the ED, he required BiPAP for acute hypoxic and hypercapnic respiratory failure. Work-up also revealed community-acquired pneumonia and COPD exacerbation.  He  was treated with empiric IV antibiotics, steroids and bronchodilators.  His condition improved and he is deemed stable for discharge home.  Patient has been advised to keep on his oxygen especially with ambulation.  Discharge plan was discussed with the patient and his sister who is the healthcare power of attorney.  She said they needed more help at home because it's becoming increasingly difficult for her to take care of her brother.  She said they have palliative care at home but did not get enough help and they were contemplating hospice.  Outpatient follow-up with palliative care for discussion of hospice was recommended."   Hospital/Facility: [facility] D/C Physician: Dr. Myriam Forehand D/C Date: 11/12/19  Records Requested: 11/15/19 Records Received: 11/15/19 Records Reviewed: 11/15/19  Diagnoses on Discharge:  CAP (community acquired pneumonia)  Date of interactive Contact within 48 hours of discharge:  Contact was through: phone  Date of 7 day or 14 day face-to-face visit:    within 7 days  Outpatient Encounter Medications as of 11/15/2019  Medication Sig Note  . albuterol (PROVENTIL) (2.5 MG/3ML) 0.083% nebulizer solution USE ONE VIAL IN NEBULIZER EVERY SIX HOURS AS NEEDED FOR WHEEZING OR SHORTNESS OF BREATH   . albuterol (VENTOLIN HFA) 108 (90 Base) MCG/ACT inhaler Inhale 2 puffs into the lungs every 4 (four) hours as needed for wheezing or shortness of breath.   . busPIRone (BUSPAR) 10 MG tablet TAKE ONE  TABLET TWICE DAILY   . omeprazole (PRILOSEC) 20 MG capsule TAKE 1 CAPSULE BY MOUTH EVERY DAY   . predniSONE (DELTASONE) 10 MG tablet Take 10 mg by mouth daily with breakfast.   . rOPINIRole (REQUIP) 0.5 MG tablet TAKE ONE TABLET EVERY MORNING AND TAKE THREE TABLETS AT BEDTIME AS NEEDED FOR RESTLESS LEG SYNDROME   . sertraline (ZOLOFT) 100 MG tablet Take 1 tablet (100 mg total) by mouth daily. TAKE 1 TABLET EVERY DAY ALONG WITH 50MG  TABLET TO EQUAL 150MG  TOTAL DAILY   . sertraline (ZOLOFT) 50 MG  tablet Take by mouth. TAKE 1 TABLET EVERY DAY ALONG WITH 100MG  TABLET TO EQUAL 150MG  TOTAL DAILY   . SPIRIVA HANDIHALER 18 MCG inhalation capsule INHALE CONTENTS OF 1 CAPSULE AS DIRECTEDEVERY DAY WITH HANDIHALER   . [DISCONTINUED] albuterol (VENTOLIN HFA) 108 (90 Base) MCG/ACT inhaler Inhale 2 puffs into the lungs every 4 (four) hours as needed for wheezing or shortness of breath.   . [DISCONTINUED] amoxicillin-clavulanate (AUGMENTIN) 875-125 MG tablet Take 1 tablet by mouth 2 (two) times daily for 4 days.   . [DISCONTINUED] DULoxetine (CYMBALTA) 30 MG capsule Take 30 mg by mouth 2 (two) times daily.   . [DISCONTINUED] montelukast (SINGULAIR) 10 MG tablet TAKE ONE TABLET AT BEDTIME   . ALPRAZolam (XANAX) 0.5 MG tablet Take 1 tablet by mouth as needed.    . [EXPIRED] oxyCODONE (OXY IR/ROXICODONE) 5 MG immediate release tablet Take 1 tablet (5 mg total) by mouth every 8 (eight) hours as needed for up to 3 days for severe pain.   . [DISCONTINUED] albuterol (PROVENTIL HFA;VENTOLIN HFA) 108 (90 Base) MCG/ACT inhaler Inhale 2 puffs into the lungs every 4 (four) hours as needed for wheezing or shortness of breath. 12/13/2018: Using 4x day  . [DISCONTINUED] albuterol (VENTOLIN HFA) 108 (90 Base) MCG/ACT inhaler INHALE 2 PUFFS INTO THE LUNGS EVERY 4 HOURS AS NEEDED FOR WHEEZING OR SHORTNESS OF BREATH   . [DISCONTINUED] metoprolol tartrate (LOPRESSOR) 25 MG tablet Take 0.5 tablets (12.5 mg total) by mouth 2 (two) times daily.   . [DISCONTINUED] rOPINIRole (REQUIP) 0.5 MG tablet TAKE ONE TABLET BY MOUTH EVERY MORNING AND 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    No facility-administered encounter medications on file as of 11/15/2019.    Diagnostic Tests Reviewed/Disposition: Labs as below on chart  CT ANGIO: IMPRESSION: No evidence of pulmonary emboli.  New pleural based density in the medial aspect of the left upper lobe posteriorly. This corresponds to the chest x-ray findings  and given its acute nature likely represents focal pneumonia. Short-term follow-up is recommended to assess for resolution following appropriate therapy.  Scattered bilateral parenchymal nodules within the lungs stable from the prior exams.  Stable severe changes of COPD.  Stable loculated fluid collection in the left base   Electronically Signed   By: M.D.   On: 11/09/2019 15:05  Consults: none  Discharge Instructions: Follow-up with PCP  Disease/illness Education: Lengthy discussion on severe COPD and goals of care + hospice level care  Home Health/Community Services Discussions/Referrals: Referral to hospice placed after discussion with patient  Establishment or re-establishment of referral orders for community resources:  none  Discussion with other health care providers: reviewed recent notes  Assessment and Support of treatment regimen adherence: reviewed with patient  Appointments Coordinated with: patient   Education for self-management, independent living, and ADLs: reviewed with patient  COPD Seen by pulmonary last 07/17/2019.Continues on Spiriva,Advair,PRN Albuterol, and Singulair. Is O2  dependent using 2L. Does continue to smoke about 1 PPD, not interested in quitting.  Overall reports symptoms that recently took him into hospital are improving.  We discussed at length goals of care and risks/benefits of CPR, ventilator, dialysis, and feeding tubes.  At this time he is considering ventilator with time limit and is leaning towards DNR.  He would like referral to hospice. COPD status:stable Satisfied with current treatment?:yes Oxygen use:yes Dyspnea frequency:baseline Cough frequency:baseline Rescue inhaler frequency:3 or more times a day and sometimes not at all Limitation of activity:no Productive cough: none Last Spirometry:with pulmonary Pneumovax:Up to Date Influenza:Up to Date  DIFFICULTY WITH ADL'S: Ongoing severe COPD  with difficulty with mobility and ADLs due to severe SOB.  Ambulates minimally due to SOB.  Patient can transfer independently.  A manual w/c would be difficult for him to utilize as well, as he enjoys being active to his highest level and this would decrease his current independence + may increase SOB due to energy exertion with use of manual chair.  He would be dependent on power wheelchair (PWC) to assist with ADLs.  He has control of upper body and is able to maneuver a power wheelchair (PWC) + cognitively understands how to safely use the power wheelchair.  Without a power wheelchair (PWC) his MRADLs would are significantly affected, as he would be unable to transfer to areas in his home to cook and bath + would not be able to attend provider appointments to assess his overall health + enjoy fishing outside.  DEPRESSION Continues on Sertraline 150 MG daily and Buspar 10 MG BID. Recently found out his mother has cancer. Mood status:stable Satisfied with current treatment?:yes Symptom severity:moderate Duration of current treatment :chronic Side effects:no Medication compliance:good compliance Psychotherapy/counseling:none Previous psychiatric medications:Duloxetine and Sertraline Depressed mood:occasional Anxious mood:no Anhedonia:no Significant weight loss or gain:no Insomnia:yeshard to fall asleep Fatigue:no Feelings of worthlessness or guilt:no Impaired concentration/indecisiveness:yes Suicidal ideations:no Hopelessness:no Crying spells:no Depression screen [facility] 2/9 11/16/2019 10/24/2019 09/18/2019 07/03/2019 05/30/2019  Decreased Interest 0 Down, Depressed, Hopeless 0 PHQ - 2 Score 0 Altered sleeping - Tired, decreased energy - Change in appetite - Feeling bad or failure about yourself  - Trouble concentrating - Moving slowly or fidgety/restless - Suicidal thoughts - 1 0 1 1  PHQ-9  Score - Difficult doing work/chores - Not difficult at all Somewhat difficult Extremely dIfficult Somewhat difficult  Some recent data might be hidden    Relevant past medical, surgical, family and social history reviewed and updated as indicated. Interim medical history since our last visit reviewed. Allergies and medications reviewed and updated.  Review of Systems  Constitutional: Negative for activity change, diaphoresis, fatigue and fever.  Respiratory: Positive for cough (baseline), shortness of breath (baseline) and wheezing (baseline). Negative for chest tightness.   Cardiovascular: Negative for chest pain, palpitations and leg swelling.  Gastrointestinal: Negative.   Musculoskeletal: Positive for back pain.  Skin: Negative.   Neurological: Negative.   Psychiatric/Behavioral: Negative.     Per HPI unless specifically indicated above     Objective:    BP 117/72   Pulse 85   Temp (!) 97.3 F (36.3 C) (Oral)   Wt 114 lb 9.6 oz (52 kg)  SpO2 93%   BMI 17.42 kg/m   Wt Readings from Last 3 Encounters:  11/16/19 111 lb (50.3 kg)  11/15/19 114 lb 9.6 oz (52 kg)  11/12/19 115 lb 6.4 oz (52.3 kg)    Physical Exam Vitals and nursing note reviewed.  Constitutional:      General: He is awake. He is not in acute distress.    Appearance: He is well-developed and well-groomed. He is cachectic. He is not ill-appearing.     Comments: Frail appearing.  HENT:     Head: Normocephalic and atraumatic.     Right Ear: Hearing normal. No drainage.     Left Ear: Hearing normal. No drainage.  Eyes:     General: Lids are normal.        Right eye: No discharge.        Left eye: No discharge.     Conjunctiva/sclera: Conjunctivae normal.     Pupils: Pupils are equal, round, and reactive to light.  Neck:     Thyroid: No thyromegaly.     Vascular: No carotid bruit.  Cardiovascular:     Rate and Rhythm: Normal rate and regular rhythm.     Heart sounds: Normal heart  sounds, S1 normal and S2 normal. No murmur heard.  No gallop.   Pulmonary:     Effort: Pulmonary effort is normal. No accessory muscle usage or respiratory distress.     Breath sounds: Decreased breath sounds and wheezing present.     Comments: Diminished breath sounds throughout with occasional expiratory wheezes, no rhonchi.  O2 by [place] on at 2 L. Abdominal:     General: Bowel sounds are normal.     Palpations: Abdomen is soft. There is no hepatomegaly or splenomegaly.     Tenderness: There is no abdominal tenderness.  Musculoskeletal:        General: Normal range of motion.     Cervical back: Normal range of motion and neck supple.     Right lower leg: No edema.     Left lower leg: No edema.     Comments: Strength BUE 4/5 and BLE 4/5  Skin:    General: Skin is warm and dry.     Comments: Mild clubbing bilateral fingernails.    Neurological:     Mental Status: He is alert and oriented to person, place, and time.     Cranial Nerves: Cranial nerves are intact.     Motor: Motor function is intact.     Deep Tendon Reflexes: Reflexes are normal and symmetric.     Reflex Scores:      Brachioradialis reflexes are 2+ on the right side and 2+ on the left side.      Patellar reflexes are 2+ on the right side and 2+ on the left side. Psychiatric:        Attention and Perception: Attention normal.        Mood and Affect: Mood normal.        Speech: Speech normal.        Behavior: Behavior normal. Behavior is cooperative.        Thought Content: Thought content normal.    Results for orders placed or performed in visit on 11/15/19  Basic metabolic panel  Result Value Ref Range   Glucose 108 (H) 65 - 99 mg/dL   BUN 21 6 - 24 mg/dL   Creatinine, Ser 0.86 0.76 - 1.27 mg/dL   GFR calc non Af Amer 85 >59 mL/min/1.73  GFR calc Af Amer 99 >59 mL/min/1.73   BUN/Creatinine Ratio 21 (H) 9 - 20   Sodium 142 134 - 144 mmol/L   Potassium 4.9 3.5 - 5.2 mmol/L   Chloride 102 96 - 106 mmol/L   CO2  28 20 - 29 mmol/L   Calcium 9.4 8.7 - 10.2 mg/dL  CBC with Differential/Platelet  Result Value Ref Range   WBC 13.3 (H) 3.4 - 10.8 x10E3/uL   RBC 4.77 4.14 - 5.80 x10E6/uL   Hemoglobin 14.7 13.0 - 17.7 g/dL   Hematocrit 69.4 85.4 - 51.0 %   MCV 92 79 - 97 fL   MCH 30.8 26.6 - 33.0 pg   MCHC 33.5 31 - 35 g/dL   RDW 62.7 03.5 - 00.9 %   Platelets 422 150 - 450 x10E3/uL   Neutrophils 85 Not Estab. %   Lymphs 6 Not Estab. %   Monocytes 5 Not Estab. %   Eos 2 Not Estab. %   Basos 1 Not Estab. %   Neutrophils Absolute 11.5 (H) 1 - 7 x10E3/uL   Lymphocytes Absolute 0.8 0 - 3 x10E3/uL   Monocytes Absolute 0.6 0 - 0 x10E3/uL   EOS (ABSOLUTE) 0.3 0.0 - 0.4 x10E3/uL   Basophils Absolute 0.1 0 - 0 x10E3/uL   Immature Granulocytes 1 Not Estab. %   Immature Grans (Abs) 0.1 0.0 - 0.1 x10E3/uL      Assessment & Plan:   Problem List Items Addressed This Visit      Respiratory   Chronic respiratory failure with hypoxia (HCC)    Chronic, ongoing with O2 use daily.  Continue current medication regimen as prescribed by pulmonary and adjust as needed + collaboration with pulmonary, CCM, and palliative team.  Recommend complete cessation smoking.  Hospice referral placed today after discussion with patient.  Return to office in 6 weeks.      Stage 3 severe COPD by GOLD classification (HCC) - Primary    Chronic, ongoing, severe with O2 dependence. Continue current medication and collaboration with pulmonary, palliative, and CCM team.  Adjust regimen as needed.  Recommend complete cessation smoking.  Referral to hospice placed.  Return to office in 6 weeks.  Recommend he schedule f/u ASAP with Dr. Meredeth Ide.      Relevant Medications   albuterol (VENTOLIN HFA) 108 (90 Base) MCG/ACT inhaler   Other Relevant Orders   Basic metabolic panel (Completed)   CBC with Differential/Platelet (Completed)   Ambulatory referral to Hospice   CAP (community acquired pneumonia)    Acute and improving at this  time, symptoms improving.  Had at length discussion today with patient on stages of COPD and goals of care.  Hospice referral placed.  Plan on return to office in 6 weeks and will repeat CXR.        Relevant Medications   albuterol (VENTOLIN HFA) 108 (90 Base) MCG/ACT inhaler     Nervous and Auditory   Nicotine dependence, cigarettes, w unsp disorders    I have recommended complete cessation of tobacco use. I have discussed various options available for assistance with tobacco cessation including over the counter methods (Nicotine gum, patch and lozenges). We also discussed prescription options (Chantix, Nicotine Inhaler / Nasal Spray). The patient is not interested in pursuing any prescription tobacco cessation options at this time.         Other   Protein-calorie malnutrition, severe (HCC)    Ongoing with severe COPD, with ongoing loss.  Recommend he continue protein shakes 3  times per day.  Educated on stages of COPD.  Hospice referral placed.      Depression, major, recurrent, moderate (HCC)    Chronic, ongoing, continues to be exacerbated by his severe COPD and recent grieving. Continue Sertraline 150 MG daily and continue Buspar 10 MG BID, he reports these work well.  He denies SI/HI.   Return in 3 months, sooner if worsening mood.  Would avoid benzo at this time, as concern for benzo and opioid combination use in this patient with severe COPD.      History of spinal fracture    With chronic pain as a result + has underlying severe COPD.  PDMP reviewed, will refill Oxycodone for short burst as is seeing pain management tomorrow and appreciated their recommendations for ongoing pain management.  We have discussed at length risks of opioid use in severe COPD, he wishes to continue current medication regimen at this time.  Hospice referral placed.      Impaired mobility and ADLs    Related to severe COPD and chronic respiratory failure.  Will work on obtaining a power wheelchair for  patient as would benefit from this for quality of life purposes and to enhance ADLs and mobility.        Goals of care, counseling/discussion    A voluntary discussion about advance care planning including the explanation and discussion of advance directives was extensively discussed  with the patient for 10 minutes with patient.  Explanation about the health care proxy and Living will was reviewed and packet with forms with explanation of how to fill them out was given.  During this discussion, the patient was able to identify a health care proxy as his sister and plans to fill out the paperwork required.  Patient was offered a separate Advance Care Planning visit for further assistance with forms.  Hospice referral has been placed, patient currently under palliative.         Time: > 25 minutes, >50% spent counseling and discussing chronic disease processes and hospice level care.  Follow up plan: Return in about 6 weeks (around 12/27/2019) for COPD and PNA.

## 2019-11-15 NOTE — Assessment & Plan Note (Signed)
A voluntary discussion about advance care planning including the explanation and discussion of advance directives was extensively discussed  with the patient for 10 minutes with patient.  Explanation about the health care proxy and Living will was reviewed and packet with forms with explanation of how to fill them out was given.  During this discussion, the patient was able to identify a health care proxy as his sister and plans to fill out the paperwork required.  Patient was offered a separate Advance Care Planning visit for further assistance with forms.  Hospice referral has been placed, patient currently under palliative.

## 2019-11-15 NOTE — Telephone Encounter (Signed)
This was just sent in. Not sure why they are sending refill request.

## 2019-11-15 NOTE — Telephone Encounter (Signed)
Order written and faxed to [facility] along with OV note and demographics.

## 2019-11-15 NOTE — Patient Instructions (Signed)
Visit Information  Goals Addressed              This Visit's Progress     RN- COPD/Depression/Chronic Pancreatitis Management (pt-stated)        Current Barriers:   Knowledge deficits related to basic COPD self care/management  Knowledge deficit related to Depression and Chronic pancreatitis  Knowledge deficit related to importance of energy conservation   Financial Barriers   Case Manager Clinical Goal(s):  Over the next 120 days patient will report using inhalers as prescribed including rinsing mouth after use  Over the next 120 days patient will report utilizing pursed lip breathing for shortness of breath  Over the next 120 days, patient will be able to verbalize understanding of COPD action plan and when to seek appropriate levels of medical care  Over the next 120 days, patient will engage in lite exercise as tolerated to build/regain stamina and strength and reduce shortness of breath through activity tolerance  Over the next 120 days, patient will verbalize basic understanding of COPD disease process and self care activities  Over the next 90 days the patient will work with pulmonary provider to get a new nebulizer machine as the one he is using is one he got at a yard sale- completed  Over the next 90 days the patient with work with the oxygen company to get needed oxygen tanks. The patient now has an [employer] machine and was told that [employer], would not be supplier anymore. The patient is trying to confirm the information he needs so he will not be without his oxygen.  Over the next 120 days, the patient will work with [facility] for effective management of depression and chronic pancreatitis   Interventions:   Provided patient with COPD action plan and reinforced importance of daily self assessment  Discussed Pulmonary Rehab and offered to assist with referral placement  Advised patient to engage in light exercise as tolerated 3-5 days a week-  patient easily gets shob when ambulating.  The patient is very limited in ability to do activity. Spoke to pcp about this at last appointment last week. The patient verbalized he feels this is a part of his COPD progression.   Discussed with patient copd symptoms, the patient verbalized he is doing well physically but mentally he is having a hard time. His elderly mother was just told she has cancer and had a PET scan to determine if the cancer has spread. 11-15-2019: the patient is in the office today post discharge for COPD exacerbation. The patient verbalized he is felling better physically.  States his "wife" died three weeks ago from "the same thing" (COPD).  The patient is upset about her passing but is dealing with it okay. The patient is finding it harder to take care of himself at home. States his sister is doing a great job but will discuss with [name] today her thoughts on Hospice coming in and working with him. Message sent to [name] with [facility]. [name] has reached out to the patient and he would like to talk to [name] before agreeing to services.   Empathetic listening and allowed the patient to reflect on his health and recent loss of his wife. He does enjoy fishing and talked about how this gives him pleasure when he can go out with his uncle and friends. He doesn't do a lot of the reeling but he likes to bait the hooks for everyone. He wants to continue  to do things he enjoys.   Patient reports his new medicare benefit has started, he is no longer solely Medicaid.   Supplied the patient with [employer] ensure and coupons at today's visit - completed   Evaluation of the patients current condition: Incoming call from the patient's sister [name].  [name] expressed that she is concerned about the patient as his oxygen levels are in the 70's and they come up some but not a lot. The patient is in bed and not getting up. She feels the patient has pneumonia. The patient  does not want to go to the hospital because he fears they will "intubate" him. Education on the need to be evaluated at the hospital and that he could express his desire not to be intubated. The patients sister is really concerned and wanted to best know how to help the patient. Secure chat messaging with the pcp and CCM team. All agree the patient should be evaluated at the hospital. Message relayed to the patients sister [name]. Talked with the patients sister about tough decisions of what the patients wishes are and discussed the options of pallative/hospice care. The patient's sister is concerned and will try to talk the patient into going to the hospital.  The patient does continue to smoke. Empathetic listening and support given to the patients sister. The patients sister will update the RNCM of any changes. Completed.  The patient is back to his baseline status.   Pharmacy referral for help with cough medicine and cost constraints.   LCSW referral for help with grief over recent news of his mother having cancer. PET scan today. The patient has a strong faith in God and says he has put it in his hands. 11-15-2019: the RNCM will let CCM team LCSW know about the patients wife passing for follow up and assistance as needed.   Review of smoking- the patient continues to smoke and is not interested in smoking cessation at this time.   Assessed the patients needs. The patient ask about the qualifiers for getting a scooter.  Encouraged the patient to talk to the pcp about the scooter. In basket message sent to [facility] for the 7 elements of requirements for scooter/power wheelchair. Will work with pcp for assistance as needed. Gave information on suppliers in [place] for scooters/power wheelchairs.   Patient Self Care Activities:  Takes medications as prescribed including inhalers  Practices and uses pursed lip breathing for shortness of breath recovery and prevention  Self assesses COPD action  plan zone and makes appointment with provider if in the yellow zone for 48 hours without improvement.  Engages in light exercise 3-5 days a week  Utilizes infection prevention strategies to reduce risk of respiratory infection   Please see past updates related to this goal by clicking on the "Past Updates" button in the selected goal         Print copy of patient instructions provided.   The care management team will reach out to the patient again over the next 30 days.   Alto Denver RN, MSN, CCM Community Care Coordinator [place]   [facility] Mobile: [phone]

## 2019-11-15 NOTE — Assessment & Plan Note (Signed)
Ongoing with severe COPD, with ongoing loss.  Recommend he continue protein shakes 3 times per day.  Educated on stages of COPD.  Hospice referral placed.

## 2019-11-15 NOTE — Telephone Encounter (Signed)
Form filled out and signed by Jolene. Placed in Dr. Henriette Combs folder for signature. Will fax back after signature.

## 2019-11-16 ENCOUNTER — Other Ambulatory Visit: Payer: Self-pay | Admitting: Nurse Practitioner

## 2019-11-16 ENCOUNTER — Ambulatory Visit
Payer: Medicare Other | Attending: Student in an Organized Health Care Education/Training Program | Admitting: Student in an Organized Health Care Education/Training Program

## 2019-11-16 ENCOUNTER — Encounter: Payer: Self-pay | Admitting: Student in an Organized Health Care Education/Training Program

## 2019-11-16 VITALS — BP 121/89 | HR 66 | Temp 97.9°F | Resp 18 | Ht 68.0 in | Wt 111.0 lb

## 2019-11-16 DIAGNOSIS — G919 Hydrocephalus, unspecified: Secondary | ICD-10-CM | POA: Insufficient documentation

## 2019-11-16 DIAGNOSIS — K861 Other chronic pancreatitis: Secondary | ICD-10-CM | POA: Diagnosis present

## 2019-11-16 DIAGNOSIS — G2581 Restless legs syndrome: Secondary | ICD-10-CM | POA: Insufficient documentation

## 2019-11-16 DIAGNOSIS — Z8781 Personal history of (healed) traumatic fracture: Secondary | ICD-10-CM | POA: Diagnosis present

## 2019-11-16 DIAGNOSIS — J449 Chronic obstructive pulmonary disease, unspecified: Secondary | ICD-10-CM | POA: Diagnosis present

## 2019-11-16 DIAGNOSIS — F331 Major depressive disorder, recurrent, moderate: Secondary | ICD-10-CM | POA: Insufficient documentation

## 2019-11-16 DIAGNOSIS — G894 Chronic pain syndrome: Secondary | ICD-10-CM | POA: Insufficient documentation

## 2019-11-16 DIAGNOSIS — S22000D Wedge compression fracture of unspecified thoracic vertebra, subsequent encounter for fracture with routine healing: Secondary | ICD-10-CM | POA: Insufficient documentation

## 2019-11-16 DIAGNOSIS — S22060D Wedge compression fracture of T7-T8 vertebra, subsequent encounter for fracture with routine healing: Secondary | ICD-10-CM | POA: Diagnosis not present

## 2019-11-16 DIAGNOSIS — M546 Pain in thoracic spine: Secondary | ICD-10-CM | POA: Diagnosis present

## 2019-11-16 LAB — CBC WITH DIFFERENTIAL/PLATELET
Basophils Absolute: 0.1 10*3/uL (ref 0.0–0.2)
Basos: 1 %
EOS (ABSOLUTE): 0.3 10*3/uL (ref 0.0–0.4)
Eos: 2 %
Hematocrit: 43.9 % (ref 37.5–51.0)
Hemoglobin: 14.7 g/dL (ref 13.0–17.7)
Immature Grans (Abs): 0.1 10*3/uL (ref 0.0–0.1)
Immature Granulocytes: 1 %
Lymphocytes Absolute: 0.8 10*3/uL (ref 0.7–3.1)
Lymphs: 6 %
MCH: 30.8 pg (ref 26.6–33.0)
MCHC: 33.5 g/dL (ref 31.5–35.7)
MCV: 92 fL (ref 79–97)
Monocytes Absolute: 0.6 10*3/uL (ref 0.1–0.9)
Monocytes: 5 %
Neutrophils Absolute: 11.5 10*3/uL — ABNORMAL HIGH (ref 1.4–7.0)
Neutrophils: 85 %
Platelets: 422 10*3/uL (ref 150–450)
RBC: 4.77 x10E6/uL (ref 4.14–5.80)
RDW: 11.8 % (ref 11.6–15.4)
WBC: 13.3 10*3/uL — ABNORMAL HIGH (ref 3.4–10.8)

## 2019-11-16 LAB — BASIC METABOLIC PANEL
BUN/Creatinine Ratio: 21 — ABNORMAL HIGH (ref 9–20)
BUN: 21 mg/dL (ref 6–24)
CO2: 28 mmol/L (ref 20–29)
Calcium: 9.4 mg/dL (ref 8.7–10.2)
Chloride: 102 mmol/L (ref 96–106)
Creatinine, Ser: 0.98 mg/dL (ref 0.76–1.27)
GFR calc Af Amer: 99 mL/min/{1.73_m2} (ref >59)
GFR calc non Af Amer: 85 mL/min/{1.73_m2} (ref >59)
Glucose: 108 mg/dL — ABNORMAL HIGH (ref 65–99)
Potassium: 4.9 mmol/L (ref 3.5–5.2)
Sodium: 142 mmol/L (ref 134–144)

## 2019-11-16 MED ORDER — AMOXICILLIN-POT CLAVULANATE 875-125 MG PO TABS: 1.0000 | ORAL_TABLET | Freq: Two times a day (BID) | ORAL | 0 refills | Status: AC

## 2019-11-16 NOTE — Progress Notes (Signed)
Safety precautions to be maintained throughout the outpatient stay will include: orient to surroundings, keep bed in low position, maintain call bell within reach at all times, provide assistance with transfer out of bed and ambulation.  

## 2019-11-16 NOTE — Telephone Encounter (Signed)
[name] with [facility] hosp[facility] is just call checking on the form she dropped off yesterday

## 2019-11-16 NOTE — Telephone Encounter (Signed)
Paper faxed back.

## 2019-11-16 NOTE — Progress Notes (Signed)
Please let Austin Blevins know his labs have returned.  Kidney function and electrolytes continue to be stable.  White blood cell count and neutrophils are mildly elevated, this may be related to infection and also to his Prednisone too.  I am going to extend his Augmentin for 7 more days.  Please ensure you follow-up with Dr. Meredeth Ide as soon as possible.  If any questions let me know.  Have a great day!!

## 2019-11-16 NOTE — Addendum Note (Signed)
Addended by: Edward Jolly on: 11/16/2019 03:44 PM   Modules accepted: Orders

## 2019-11-16 NOTE — Addendum Note (Signed)
Addended by: Edward Jolly on: 11/16/2019 03:46 PM   Modules accepted: Orders

## 2019-11-16 NOTE — Progress Notes (Signed)
Patient: Austin Blevins  Service Category: E/M  Provider: Gillis Santa, MD  DOB: 06-Dec-1960  DOS: 11/16/2019  Referring Provider: Lonell Face, NP  MRN: [id number]  Setting: Ambulatory outpatient  PCP: [name], NP  Type: New Patient  Specialty: Interventional Pain Management    Location: Office  Delivery: Face-to-face     Primary Reason(s) for Visit: Encounter for initial evaluation of one or more chronic problems (new to examiner) potentially causing chronic pain, and posing a threat to normal musculoskeletal function. (Level of risk: High) CC: Back Pain (lower) and Chest Pain (bilateral)  HPI  Austin Blevins is a 59 y.o. year old, male patient, who comes today to see [place] for the first time for an initial evaluation of his chronic pain. He has Chronic respiratory failure with hypoxia ([place]); Protein-calorie malnutrition, severe ([place]); Stage 3 severe COPD by GOLD classification ([place]); Nicotine dependence, cigarettes, w unsp disorders; Chronic pancreatitis ([place]); Depression, major, recurrent, moderate ([place]); Prediabetes; Rotator cuff dysfunction, left; Restless legs syndrome (RLS); Gastroesophageal reflux disease; DNR (do not resuscitate); Low back pain; Pulmonary nodules; Hydrocephalus ([place]); History of spinal fracture; Grief; CAP (community acquired pneumonia); Impaired mobility and ADLs; Goals of care, counseling/discussion; Discogenic thoracic pain; Chronic pain syndrome; and Compression fracture of thoracic vertebra with routine healing on their problem list. Today he comes in for evaluation of his Back Pain (lower) and Chest Pain (bilateral)  Pain Assessment: Location: Lower Back Radiating: denies Onset: More than a month ago Duration: Chronic pain Quality: Throbbing Severity: 7 /10 (subjective, self-reported pain score)  Effect on ADL: difficulty performing daily activities, difficulty moving Timing: Constant Modifying factors: nothing BP: 121/89  HR: 66  Onset and Duration: Gradual  and Present longer than 3 months Cause of pain: Unknown Severity: Getting worse, NAS-11 at its worse: 10/10, NAS-11 at its best: 7/10, NAS-11 now: 8/10 and NAS-11 on the average: 8/10 Timing: Morning, Afternoon, Night, Not influenced by the time of the day and After a period of immobility Aggravating Factors: Prolonged sitting and Prolonged standing Alleviating Factors: Lying down, Medications, Resting, Sitting and Sleeping Associated Problems: Depression, Dizziness, Fatigue, Inability to concentrate, Sadness, Weakness, Pain that wakes patient up and Pain that does not allow patient to sleep Quality of Pain: Aching, Annoying, Burning, Dull, Nagging, Stabbing, Tingling and Uncomfortable Previous Examinations or Tests: MRI scan and X-rays Previous Treatments: The patient denies any previous treatments  The patient comes into the clinics today for the first time for a chronic pain management evaluation.    59 year old male who presents with a chief complaint of mid back pain secondary to T7 thoracic compression fracture.  Patient sustained this compression fracture in approximately February, he is unaware of the mechanism but states that he was prescribed a medication for depression which he thinks was Cymbalta that caused him to hallucinate and wake up in the middle of the night and jump off the bed which resulted in a mild T8 compression fracture.  He was provided with a TLSO brace at that time but has not been compliant with it.  He has significant thoracic pain.  He is on oxygen at 2 to 3 L.  He has stage III COPD.  He states that hospice has been considered and that he is waiting for them to see him.  He states that he takes his oxycodone 3-4 times a day which helps with his back pain and also helps him function.  He denies any bowel or bladder issues.  He continues to smoke.  He has very limited mobility and presents today in a wheelchair.  Of note, patient also has a ventricular shunt for  hydrocephalus.  He also has a history of chronic pancreatitis.   Historic Controlled Substance Pharmacotherapy Review  PMP and historical list of controlled substances  11/01/2019  1   11/01/2019  Oxycodone Hcl 5 MG Tablet  20.00  5 Kallie Locks   [id number]   Thr (4878)   0  30.00 MME  Medicare   [place]  10/24/2019  1   10/24/2019  Oxycodone Hcl 5 MG Tablet  10.00  10 Ch Zde   [id number]   Thr (4878)   0  7.50 MME  Medicare   [place]     Medications: The patient did not bring the medication(s) to the appointment, as requested in our "New Patient Package" Pharmacodynamics: Desired effects: Analgesia: The patient reports 50% benefit. Reported improvement in function: The patient reports medication allows him to accomplish basic ADLs. Clinically meaningful improvement in function (CMIF): Sustained CMIF goals met Perceived effectiveness: Described as relatively effective, allowing for increase in activities of daily living (ADL) Undesirable effects: Side-effects or Adverse reactions: None reported Historical Monitoring: The patient  reports no history of drug use. List of all UDS Test(s): Lab Results  Component Value Date   MDMA NONE DETECTED 07/22/2019   COCAINSCRNUR NONE DETECTED 07/22/2019   PCPSCRNUR NONE DETECTED 07/22/2019   THCU NONE DETECTED 07/22/2019   List of other Serum/Urine Drug Screening Test(s):  Lab Results  Component Value Date   COCAINSCRNUR NONE DETECTED 07/22/2019   THCU NONE DETECTED 07/22/2019   Historical Background Evaluation: [place] PMP: PDMP not reviewed this encounter. Six (6) year initial data search conducted.              Department of public safety, offender search: Editor, commissioning Information) Non-contributory Risk Assessment Profile: Aberrant behavior: None observed or detected today Risk factors for fatal opioid overdose: None identified today Fatal overdose hazard ratio (HR): Calculation deferred Non-fatal overdose hazard ratio (HR): Calculation deferred Risk of opioid  abuse or dependence: 0.7-3.0% with doses ? 36 MME/day and 6.1-26% with doses ? 120 MME/day. Substance use disorder (SUD) risk level: See below Personal History of Substance Abuse (SUD-Substance use disorder):  Alcohol: Negative  Illegal Drugs: Negative  Rx Drugs: Negative  ORT Risk Level calculation: Low Risk  Opioid Risk Tool - 11/16/19 1109      Family History of Substance Abuse   Alcohol Positive Male    Illegal Drugs Negative    Rx Drugs Negative      Personal History of Substance Abuse   Alcohol Negative    Illegal Drugs Negative    Rx Drugs Negative      Age   Age between 26-45 years  No      History of Preadolescent Sexual Abuse   History of Preadolescent Sexual Abuse Negative or Male      Psychological Disease   Psychological Disease Negative    Depression Negative      Total Score   Opioid Risk Tool Scoring 3    Opioid Risk Interpretation Low Risk          ORT Scoring interpretation table:  Score <3 = Low Risk for SUD  Score between 4-7 = Moderate Risk for SUD  Score >8 = High Risk for Opioid Abuse   PHQ-2 Depression Scale:  Total score: 0  PHQ-2 Scoring interpretation table: (Score and probability of major depressive disorder)  Score 0 = No depression  Score 1 = 15.4% Probability  Score 2 = 21.1% Probability  Score 3 = 38.4% Probability  Score 4 = 45.5% Probability  Score 5 = 56.4% Probability  Score 6 = 78.6% Probability   PHQ-9 Depression Scale:  Total score: 0  PHQ-9 Scoring interpretation table:  Score 0-4 = No depression  Score 5-9 = Mild depression  Score 10-14 = Moderate depression  Score 15-19 = Moderately severe depression  Score 20-27 = Severe depression (2.4 times higher risk of SUD and 2.89 times higher risk of overuse)   Pharmacologic Plan: As per protocol, I have not taken over any controlled substance management, pending the results of ordered tests and/or consults.            Initial impression: Pending review of available data  and ordered tests.  Meds   Current Outpatient Medications:  .  albuterol (PROVENTIL) (2.5 MG/3ML) 0.083% nebulizer solution, USE ONE VIAL IN NEBULIZER EVERY SIX HOURS AS NEEDED FOR WHEEZING OR SHORTNESS OF BREATH, Disp: 1080 mL, Rfl: 3 .  albuterol (VENTOLIN HFA) 108 (90 Base) MCG/ACT inhaler, Inhale 2 puffs into the lungs every 4 (four) hours as needed for wheezing or shortness of breath., Disp: 18 g, Rfl: 5 .  ALPRAZolam (XANAX) 0.5 MG tablet, Take 1 tablet by mouth as needed. , Disp: , Rfl:  .  amoxicillin-clavulanate (AUGMENTIN) 875-125 MG tablet, Take 1 tablet by mouth 2 (two) times daily for 7 days., Disp: 14 tablet, Rfl: 0 .  busPIRone (BUSPAR) 10 MG tablet, TAKE ONE TABLET TWICE DAILY, Disp: 180 tablet, Rfl: 0 .  montelukast (SINGULAIR) 10 MG tablet, TAKE ONE TABLET AT BEDTIME, Disp: 30 tablet, Rfl: 3 .  omeprazole (PRILOSEC) 20 MG capsule, TAKE 1 CAPSULE BY MOUTH EVERY DAY, Disp: 90 capsule, Rfl: 3 .  oxyCODONE (OXY IR/ROXICODONE) 5 MG immediate release tablet, Take 1 tablet (5 mg total) by mouth every 8 (eight) hours as needed for up to 3 days for severe pain., Disp: 9 tablet, Rfl: 0 .  predniSONE (DELTASONE) 10 MG tablet, Take 10 mg by mouth daily with breakfast., Disp: , Rfl:  .  rOPINIRole (REQUIP) 0.5 MG tablet, TAKE ONE TABLET EVERY MORNING AND TAKE THREE TABLETS AT BEDTIME AS NEEDED FOR RESTLESS LEG SYNDROME, Disp: 120 tablet, Rfl: 2 .  sertraline (ZOLOFT) 100 MG tablet, Take 1 tablet (100 mg total) by mouth daily. TAKE 1 TABLET EVERY DAY ALONG WITH 50MG TABLET TO EQUAL 150MG TOTAL DAILY, Disp: , Rfl:  .  sertraline (ZOLOFT) 50 MG tablet, Take by mouth. TAKE 1 TABLET EVERY DAY ALONG WITH 100MG TABLET TO EQUAL 150MG TOTAL DAILY, Disp: , Rfl:  .  SPIRIVA HANDIHALER 18 MCG inhalation capsule, INHALE CONTENTS OF 1 CAPSULE AS DIRECTEDEVERY DAY WITH HANDIHALER, Disp: 90 capsule, Rfl: 3  Imaging Review   Shoulder-L DG: Results for orders placed during the hospital encounter of  07/05/18  DG Shoulder Left  Narrative CLINICAL DATA:  LEFT shoulder pain for 3 weeks.  No known injury.  EXAM: LEFT SHOULDER - 2+ VIEW  COMPARISON:  None.  FINDINGS: There is no evidence of fracture or dislocation. There is no evidence of arthropathy or other focal bone abnormality. Soft tissues are unremarkable.  IMPRESSION: Negative.   Electronically Signed By: Staci Righter M.D. On: 07/06/2018 08:40   Complexity Note: Imaging results reviewed. Results shared with Mr. Daft, using Layman's terms.  ROS  Cardiovascular: No reported cardiovascular signs or symptoms such as High blood pressure, coronary artery disease, abnormal heart rate or rhythm, heart attack, blood thinner therapy or heart weakness and/or failure Pulmonary or Respiratory: Difficulty blowing air out (Emphysema), Smoking and Temporary stoppage of breathing during sleep Neurological: No reported neurological signs or symptoms such as seizures, abnormal skin sensations, urinary and/or fecal incontinence, being born with an abnormal open spine and/or a tethered spinal cord Psychological-Psychiatric: Anxiousness and Depressed Gastrointestinal: No reported gastrointestinal signs or symptoms such as vomiting or evacuating blood, reflux, heartburn, alternating episodes of diarrhea and constipation, inflamed or scarred liver, or pancreas or irrregular and/or infrequent bowel movements Genitourinary: No reported renal or genitourinary signs or symptoms such as difficulty voiding or producing urine, peeing blood, non-functioning kidney, kidney stones, difficulty emptying the bladder, difficulty controlling the flow of urine, or chronic kidney disease Hematological: Brusing easily Endocrine: No reported endocrine signs or symptoms such as high or low blood sugar, rapid heart rate due to high thyroid levels, obesity or weight gain due to slow thyroid or thyroid disease Rheumatologic: No reported  rheumatological signs and symptoms such as fatigue, joint pain, tenderness, swelling, redness, heat, stiffness, decreased range of motion, with or without associated rash Musculoskeletal: Negative for myasthenia gravis, muscular dystrophy, multiple sclerosis or malignant hyperthermia Work History: Disabled  Allergies  Mr. Froman is allergic to acetaminophen, duloxetine, and percocet [oxycodone-acetaminophen].  Laboratory Chemistry Profile   Renal Lab Results  Component Value Date   BUN 21 11/15/2019   CREATININE 0.98 11/15/2019   BCR 21 (H) 11/15/2019   GFRAA 99 11/15/2019   GFRNONAA 85 11/15/2019   PROTEINUR NEGATIVE 07/22/2019     Electrolytes Lab Results  Component Value Date   NA 142 11/15/2019   K 4.9 11/15/2019   CL 102 11/15/2019   CALCIUM 9.4 11/15/2019   MG 2.3 08/02/2019   PHOS 3.8 08/01/2019     Hepatic Lab Results  Component Value Date   AST 11 09/13/2019   ALT 11 09/13/2019   ALBUMIN 4.0 09/13/2019   ALKPHOS 98 09/13/2019   LIPASE 108 (H) 07/20/2019     ID Lab Results  Component Value Date   HIV Non Reactive 11/09/2019   [place] NEGATIVE 11/09/2019   MRSAPCR NEGATIVE 07/22/2019   HCVAB NON REACTIVE 07/30/2019     Bone No results found for: VD25OH, [id number], UP1031RX4, [id number], 25OHVITD1, 25OHVITD2, 25OHVITD3, TESTOFREE, TESTOSTERONE   Endocrine Lab Results  Component Value Date   GLUCOSE 108 (H) 11/15/2019   GLUCOSEU NEGATIVE 07/22/2019   HGBA1C 6.1 (H) 07/21/2019   TSH 0.874 08/07/2019   FREET4 0.50 (L) 07/29/2019     Neuropathy Lab Results  Component Value Date   HGBA1C 6.1 (H) 07/21/2019   HIV Non Reactive 11/09/2019     CNS No results found for: COLORCSF, APPEARCSF, RBCCOUNTCSF, WBCCSF, POLYSCSF, LYMPHSCSF, EOSCSF, PROTEINCSF, GLUCCSF, JCVIRUS, CSFOLI, IGGCSF, LABACHR, ACETBL, LABACHR, ACETBL   Inflammation (CRP: Acute  ESR: Chronic) Lab Results  Component Value Date   LATICACIDVEN 1.5 07/22/2019     Rheumatology No  results found for: RF, ANA, LABURIC, URICUR, LYMEIGGIGMAB, LYMEABIGMQN, HLAB27   Coagulation Lab Results  Component Value Date   INR 0.9 07/21/2019   LABPROT 12.3 07/21/2019   APTT <24 (L) 07/21/2019   PLT 422 11/15/2019     Cardiovascular Lab Results  Component Value Date   BNP 43.0 11/09/2019   TROPONINI <0.03 04/07/2018   HGB 14.7 11/15/2019   HCT 43.9 11/15/2019  Screening Lab Results  Component Value Date   SARSCOV2NAA NEGATIVE 11/09/2019   MRSAPCR NEGATIVE 07/22/2019   HCVAB NON REACTIVE 07/30/2019   HIV Non Reactive 11/09/2019     Cancer No results found for: CEA, CA125, LABCA2   Allergens No results found for: ALMOND, APPLE, ASPARAGUS, AVOCADO, BANANA, BARLEY, BASIL, BAYLEAF, GREENBEAN, LIMABEAN, WHITEBEAN, BEEFIGE, REDBEET, BLUEBERRY, BROCCOLI, CABBAGE, MELON, CARROT, CASEIN, CASHEWNUT, CAULIFLOWER, CELERY     Note: Lab results reviewed.   PFSH  Drug: Mr. Wilhelmsen  reports no history of drug use. Alcohol:  reports no history of alcohol use. Tobacco:  reports that he has been smoking cigarettes. He has a 40.00 pack-year smoking history. He has never used smokeless tobacco. Medical:  has a past medical history of Anxiety, COPD (chronic obstructive pulmonary disease) ([place]), Depression, and Hydrocephalus ([place]). Family: family history includes Anxiety disorder in his sister; Arthritis/Rheumatoid in his brother and sister; CAD in his father; Depression in his sister; Diabetes in his sister, sister, and sister; Diabetes Mellitus II in his mother; Heart disease in his brother; Hyperlipidemia in his sister; Hypertension in his father and sister; Other in his brother and brother.  Past Surgical History:  Procedure Laterality Date  . BRAIN SURGERY  2003   VA shunt for hydrocephalus  . ventricular shunt     Active Ambulatory Problems    Diagnosis Date Noted  . Chronic respiratory failure with hypoxia ([place]) 01/21/2017  . Protein-calorie malnutrition, severe ([place])  01/22/2017  . Stage 3 severe COPD by GOLD classification ([place]) 02/11/2017  . Nicotine dependence, cigarettes, w unsp disorders 05/25/2017  . Chronic pancreatitis ([place]) 05/25/2017  . Depression, major, recurrent, moderate ([place]) 04/11/2014  . Prediabetes 07/17/2018  . Rotator cuff dysfunction, left 07/19/2018  . Restless legs syndrome (RLS) 08/02/2018  . Gastroesophageal reflux disease 08/02/2018  . DNR (do not resuscitate) 10/24/2018  . Low back pain 12/21/2018  . Pulmonary nodules 05/27/2019  . Hydrocephalus ([place]) 07/21/2019  . History of spinal fracture 07/21/2019  . Grief 10/24/2019  . CAP (community acquired pneumonia) 11/09/2019  . Impaired mobility and ADLs 11/15/2019  . Goals of care, counseling/discussion 11/15/2019  . Discogenic thoracic pain 11/16/2019  . Chronic pain syndrome 11/16/2019  . Compression fracture of thoracic vertebra with routine healing 11/16/2019   Resolved Ambulatory Problems    Diagnosis Date Noted  . Healthcare maintenance 02/11/2017  . Anxiety 06/22/2017  . Malnutrition ([place]) 04/11/2014  . Pleural effusion, left 11/08/2014  . Smokers' cough ([place]) 03/26/2014  . CAP (community acquired pneumonia) 04/08/2018  . COPD with acute exacerbation ([place]) 07/20/2019  . Chest pain at rest 07/20/2019  . COPD exacerbation ([place]) 07/21/2019  . Visual hallucinations   . Sinus tachycardia 08/07/2019  . Closed fracture of seventh thoracic vertebra with routine healing 09/18/2019  . Acute on chronic respiratory failure with hypercapnia ([place]) 11/09/2019   Past Medical History:  Diagnosis Date  . COPD (chronic obstructive pulmonary disease) ([place])   . Depression    Constitutional Exam  General appearance: alert, cooperative and cachectic Vitals:   11/16/19 1055  BP: 121/89  Pulse: 66  Resp: 18  Temp: 97.9 F (36.6 C)  TempSrc: Temporal  SpO2: 100%  Weight: 111 lb (50.3 kg)  Height: '5\' 8"'  (1.727 m)   BMI Assessment: Estimated body mass index is 16.88 kg/m  as calculated from the following:   Height as of this encounter: '5\' 8"'  (1.727 m).   Weight as of this encounter: 111 lb (50.3 kg).  BMI interpretation table: BMI  level Category Range association with higher incidence of chronic pain  <18 kg/m2 Underweight   18.5-24.9 kg/m2 Ideal body weight   25-29.9 kg/m2 Overweight Increased incidence by 20%  30-34.9 kg/m2 Obese (Class I) Increased incidence by 68%  35-39.9 kg/m2 Severe obesity (Class II) Increased incidence by 136%  >40 kg/m2 Extreme obesity (Class III) Increased incidence by 254%   Patient's current BMI Ideal Body weight  Body mass index is 16.88 kg/m. Ideal body weight: 68.4 kg (150 lb 12.7 oz)   BMI Readings from Last 4 Encounters:  11/16/19 16.88 kg/m  11/15/19 17.42 kg/m  11/12/19 17.55 kg/m  10/24/19 19.11 kg/m   Wt Readings from Last 4 Encounters:  11/16/19 111 lb (50.3 kg)  11/15/19 114 lb 9.6 oz (52 kg)  11/12/19 115 lb 6.4 oz (52.3 kg)  10/24/19 122 lb (55.3 kg)    Psych/Mental status: Alert, oriented x 3 (person, place, & time)       Eyes: PERLA Respiratory: Oxygen-dependent COPD  Cervical Spine Exam  Skin & Axial Inspection: No masses, redness, edema, swelling, or associated skin lesions Alignment: Symmetrical Functional ROM: Unrestricted ROM      Stability: No instability detected Muscle Tone/Strength: Functionally intact. No obvious neuro-muscular anomalies detected. Sensory (Neurological): Unimpaired Palpation: No palpable anomalies              Upper Extremity (UE) Exam    Side: Right upper extremity  Side: Left upper extremity  Skin & Extremity Inspection: Skin color, temperature, and hair growth are WNL. No peripheral edema or cyanosis. No masses, redness, swelling, asymmetry, or associated skin lesions. No contractures.  Skin & Extremity Inspection: Skin color, temperature, and hair growth are WNL. No peripheral edema or cyanosis. No masses, redness, swelling, asymmetry, or associated skin  lesions. No contractures.  Functional ROM: Unrestricted ROM          Functional ROM: Unrestricted ROM          Muscle Tone/Strength: Functionally intact. No obvious neuro-muscular anomalies detected.   Muscle Tone/Strength: Functionally intact. No obvious neuro-muscular anomalies detected.  Sensory (Neurological): Unimpaired          Sensory (Neurological): Unimpaired          Palpation: No palpable anomalies              Palpation: No palpable anomalies              Provocative Test(s):  Phalen's test: deferred Tinel's test: deferred Apley's scratch test (touch opposite shoulder):  Action 1 (Across chest): deferred Action 2 (Overhead): deferred Action 3 (LB reach): deferred   Provocative Test(s):  Phalen's test: deferred Tinel's test: deferred Apley's scratch test (touch opposite shoulder):  Action 1 (Across chest): deferred Action 2 (Overhead): deferred Action 3 (LB reach): deferred    Thoracic Spine Area Exam  Skin & Axial Inspection: No masses, redness, or swelling Alignment: Symmetrical Functional ROM: Pain restricted ROM Stability: No instability detected Muscle Tone/Strength: Functionally intact. No obvious neuro-muscular anomalies detected. Sensory (Neurological): Musculoskeletal pain pattern and neurogenic Muscle strength & Tone: Complains of area being tender to palpation  Lumbar Exam  Skin & Axial Inspection: No masses, redness, or swelling Alignment: Symmetrical Functional ROM: Pain restricted ROM       Stability: No instability detected Muscle Tone/Strength: Functionally intact. No obvious neuro-muscular anomalies detected. Sensory (Neurological): Musculoskeletal pain pattern Palpation: No palpable anomalies       Provocative Tests: Hyperextension/rotation test: deferred today       Lumbar quadrant test (Kemp's  test): deferred today       Lateral bending test: deferred today       Patrick's Maneuver: deferred today                   FABER* test: deferred today                    S-I anterior distraction/compression test: deferred today         S-I lateral compression test: deferred today         S-I Thigh-thrust test: deferred today         S-I Gaenslen's test: deferred today         *(Flexion, ABduction and External Rotation)  Gait & Posture Assessment  Ambulation: Patient came in today in a wheel chair Gait: Significantly limited. Dependent on assistive device to ambulate Posture: Difficulty standing up straight, due to pain   Lower Extremity Exam    Side: Right lower extremity  Side: Left lower extremity  Stability: No instability observed          Stability: No instability observed          Skin & Extremity Inspection: Skin color, temperature, and hair growth are WNL. No peripheral edema or cyanosis. No masses, redness, swelling, asymmetry, or associated skin lesions. No contractures.  Skin & Extremity Inspection: Skin color, temperature, and hair growth are WNL. No peripheral edema or cyanosis. No masses, redness, swelling, asymmetry, or associated skin lesions. No contractures.  Functional ROM: Pain restricted ROM for all joints of the lower extremity          Functional ROM: Pain restricted ROM for all joints of the lower extremity          Muscle Tone/Strength: Functionally intact. No obvious neuro-muscular anomalies detected.  Muscle Tone/Strength: Functionally intact. No obvious neuro-muscular anomalies detected.  Sensory (Neurological): Musculoskeletal pain pattern        Sensory (Neurological): Musculoskeletal pain pattern        DTR: Patellar: deferred today Achilles: deferred today Plantar: deferred today  DTR: Patellar: deferred today Achilles: deferred today Plantar: deferred today  Palpation: No palpable anomalies  Palpation: No palpable anomalies   Assessment  Primary Diagnosis & Pertinent Problem List: The primary encounter diagnosis was Compression fracture of T7 vertebra with routine healing, subsequent encounter.  Diagnoses of History of spinal fracture, Hydrocephalus, unspecified type ([place]), Stage 3 severe COPD by GOLD classification ([place]), Chronic pancreatitis, unspecified pancreatitis type ([place]), Restless legs syndrome (RLS), Depression, major, recurrent, moderate ([place]), Discogenic thoracic pain, and Chronic pain syndrome were also pertinent to this visit.  Visit Diagnosis (New problems to examiner): 1. Compression fracture of T7 vertebra with routine healing, subsequent encounter   2. History of spinal fracture   3. Hydrocephalus, unspecified type ([place])   4. Stage 3 severe COPD by GOLD classification ([place])   5. Chronic pancreatitis, unspecified pancreatitis type ([place])   6. Restless legs syndrome (RLS)   7. Depression, major, recurrent, moderate ([place])   8. Discogenic thoracic pain   9. Chronic pain syndrome    Mr. Wadlow is a pleasant 59 year old male with mid thoracic pain related to T7 compression fracture as a result of a fall that he sustained due to a medication side effect of hallucination while he was taking Cymbalta for depression.  He has hydrocephalus as well and stage III COPD on daily oxygen.  He also has chronic pancreatitis and depression.  He is being referred here for consideration of chronic  opioid therapy.  Patient states that he utilizes oxycodone 5 mg 3-4 times a day as needed.  He states that this does help his pain and allows him to function more comfortably.  He states that he has an upcoming evaluation with hospice which I encouraged him to keep to discuss goals of care.  To be considered for long-term opioid therapy, patient will need urine toxicology screen and psych assessment for risk of substance abuse disorder.  Pending on results from these, can consider taking the patient on for chronic opioid therapy.  Plan of Care (Initial workup plan)   Lab Orders     Compliance Drug Analysis, Ur  Referral Orders     Ambulatory referral to Psychology  Patient instructed to call for  second patient visit after he has completed psychological assessment and urine toxicology screen.  Pharmacological management options:  Opioid Analgesics: The patient was informed that there is no guarantee that he would be a candidate for opioid analgesics. The decision will be made following CDC guidelines. This decision will be based on the results of diagnostic studies, as well as Mr. Bayona risk profile.   Membrane stabilizer: To be determined at a later time  Muscle relaxant: To be determined at a later time  NSAID: To be determined at a later time  Other analgesic(s): To be determined at a later time     Provider-requested follow-up: Return for please have pt call after psych eval for 2nd visit.  Future Appointments  Date Time Provider [place]  12/06/2019  1:30 PM CFP CCM PHARMACY [facility]  12/13/2019  1:00 PM CFP CCM SOCIAL WORK [facility]  12/26/2019  2:00 PM Marnee Guarneri T, NP [facility]  12/27/2019  1:00 PM CFP CCM CASE MANAGER [facility]    Note by: Gillis Santa, MD Date: 11/16/2019; Time: 1:16 PM

## 2019-11-16 NOTE — Telephone Encounter (Signed)
[name] notified that we will fax it back as soon as we get a signature from Dr.Johnson.

## 2019-11-20 ENCOUNTER — Other Ambulatory Visit: Payer: Self-pay | Admitting: Nurse Practitioner

## 2019-11-21 ENCOUNTER — Telehealth: Payer: Self-pay

## 2019-11-21 NOTE — Telephone Encounter (Signed)
Confirmed with Pam RN @ PCP office that patient is now under Hospice services with [facility]. Will close Palliative Care referral.

## 2019-11-22 ENCOUNTER — Other Ambulatory Visit: Payer: Self-pay | Admitting: Nurse Practitioner

## 2019-11-22 ENCOUNTER — Telehealth: Payer: Self-pay | Admitting: Nurse Practitioner

## 2019-11-22 LAB — BLOOD GAS, VENOUS
Acid-Base Excess: 3.8 mmol/L — ABNORMAL HIGH (ref 0.0–2.0)
Bicarbonate: 34.3 mmol/L — ABNORMAL HIGH (ref 20.0–28.0)
O2 Saturation: 35.3 %
Patient temperature: 37
pCO2, Ven: 82 mmHg (ref 44.0–60.0)
pH, Ven: 7.23 — ABNORMAL LOW (ref 7.250–7.430)

## 2019-11-22 NOTE — Telephone Encounter (Signed)
Copied from CRM [id number]. Topic: General - Call Back - No Documentation >> Nov 22, 2019  3:55 PM Reuben Likes D wrote: Reason for CRM: Emiliano Dyer from [employer] called requesting "[place]" to let her know that she will be faxing over documentation that is approving power chair for patient and not a POV. Erie Noe states Medicaid does not cover POV and patient came into the store today and selected a power chair that he wanted.

## 2019-11-23 NOTE — Telephone Encounter (Signed)
Will be completed in morning.

## 2019-11-23 NOTE — Telephone Encounter (Signed)
Form placed in provider's folder 

## 2019-11-24 LAB — DRUG SCREEN 10 W/CONF, SERUM
Amphetamines, IA: NEGATIVE ng/mL
Barbiturates, IA: NEGATIVE ug/mL
Benzodiazepines, IA: NEGATIVE ng/mL
Cocaine & Metabolite, IA: NEGATIVE ng/mL
Methadone, IA: NEGATIVE ng/mL
Opiates, IA: NEGATIVE ng/mL
Oxycodones, IA: POSITIVE ng/mL — AB
Phencyclidine, IA: NEGATIVE ng/mL
Propoxyphene, IA: NEGATIVE ng/mL
THC(Marijuana) Metabolite, IA: NEGATIVE ng/mL

## 2019-11-24 LAB — OXYCODONES,MS,WB/SP RFX
Oxycocone: 20.6 ng/mL
Oxycodones Confirmation: POSITIVE
Oxymorphone: NEGATIVE ng/mL

## 2019-11-28 ENCOUNTER — Other Ambulatory Visit: Payer: Self-pay | Admitting: Nurse Practitioner

## 2019-11-28 ENCOUNTER — Telehealth: Payer: Self-pay

## 2019-11-28 ENCOUNTER — Telehealth: Payer: Self-pay | Admitting: Student in an Organized Health Care Education/Training Program

## 2019-11-28 NOTE — Telephone Encounter (Signed)
Jessica from Dr. Elvera Maria office called stating they reached out to Austin Blevins to set up an appt. For his med psych eval. He told them he gets his meds from hospice and doesn't need pain mgmt. He does not have any appt. Set up

## 2019-11-28 NOTE — Telephone Encounter (Signed)
Requested medication (s) are due for refill today:  unknown  Requested medication (s) are on the active medication list:  No  Future visit scheduled:  Yes  Last Refill:  07/2019  Note to Clinic:  Metoprolol was on discharge notes 11/09/19.  This nurse not able to see where it was documented to stop taking.  It was d/c'd from medication list on 11/15/19 with reason "discontinued by provider".  Please advise.   Requested Prescriptions  Pending Prescriptions Disp Refills   metoprolol tartrate (LOPRESSOR) 25 MG tablet [Pharmacy Med Name: METOPROLOL TARTRATE 25 MG TAB] 30 tablet     Sig: TAKE ONE-HALF TABLET BY MOUTH TWICE DAILY      Cardiovascular:  Beta Blockers Passed - 11/28/2019  4:20 PM      Passed - Last BP in normal range    BP Readings from Last 1 Encounters:  11/16/19 121/89          Passed - Last Heart Rate in normal range    Pulse Readings from Last 1 Encounters:  11/16/19 66          Passed - Valid encounter within last 6 months    Recent Outpatient Visits           1 week ago Stage 3 severe COPD by GOLD classification (HCC)   [facility] [place], [name], NP   1 month ago Grief   [facility] Mardene Celeste I, NP   2 months ago Stage 3 severe COPD by GOLD classification (HCC)   [facility] [place], Jolene T, NP   3 months ago COPD with acute exacerbation (HCC)   [facility] [name], Jolene T, NP   4 months ago Depression, major, recurrent, moderate (HCC)   [facility] [place], [name], NP       Future Appointments             In 4 weeks [name], [name], NP [employer], [facility]

## 2019-12-06 ENCOUNTER — Ambulatory Visit: Payer: Self-pay | Admitting: General Practice

## 2019-12-06 ENCOUNTER — Ambulatory Visit: Payer: Self-pay | Admitting: Licensed Clinical Social Worker

## 2019-12-06 ENCOUNTER — Telehealth: Payer: Self-pay

## 2019-12-06 DIAGNOSIS — F331 Major depressive disorder, recurrent, moderate: Secondary | ICD-10-CM

## 2019-12-06 DIAGNOSIS — J449 Chronic obstructive pulmonary disease, unspecified: Secondary | ICD-10-CM

## 2019-12-06 DIAGNOSIS — F4321 Adjustment disorder with depressed mood: Secondary | ICD-10-CM

## 2019-12-06 DIAGNOSIS — F419 Anxiety disorder, unspecified: Secondary | ICD-10-CM

## 2019-12-06 NOTE — Telephone Encounter (Signed)
Called over to [employer] to see what the issue is with the paperwork and office visit note. The office not cannot state a POV anywhere in the note. All of those have to be replaced with power wheelchair or the insurance will not pay.   Also if we create an addendum to the note, which I told her was the only way we could change the note, then we have to have a statement at the top of the note stating :  "This is an addendum to the original office visit note from 11/15/2019."  They are faxing back over the paperwork so we can get this corrected and resent to them.

## 2019-12-06 NOTE — Telephone Encounter (Signed)
We resent this correct?

## 2019-12-06 NOTE — Patient Instructions (Signed)
Visit Information  Goals Addressed              This Visit's Progress     RN- COPD/Depression/Chronic Pancreatitis Management (pt-stated)        Current Barriers:   Knowledge deficits related to basic COPD self care/management  Knowledge deficit related to Depression and Chronic pancreatitis  Knowledge deficit related to importance of energy conservation   Financial Barriers   Case Manager Clinical Goal(s):  Over the next 120 days patient will report using inhalers as prescribed including rinsing mouth after use  Over the next 120 days patient will report utilizing pursed lip breathing for shortness of breath  Over the next 120 days, patient will be able to verbalize understanding of COPD action plan and when to seek appropriate levels of medical care  Over the next 120 days, patient will engage in lite exercise as tolerated to build/regain stamina and strength and reduce shortness of breath through activity tolerance  Over the next 120 days, patient will verbalize basic understanding of COPD disease process and self care activities  Over the next 90 days the patient will work with pulmonary provider to get a new nebulizer machine as the one he is using is one he got at a yard sale- completed  Over the next 90 days the patient with work with the oxygen company to get needed oxygen tanks. The patient now has an Inogen machine and was told that [employer], would not be supplier anymore. The patient is trying to confirm the information he needs so he will not be without his oxygen.  Over the next 120 days, the patient will work with [facility] for effective management of depression and chronic pancreatitis   Interventions:   Provided patient with COPD action plan and reinforced importance of daily self assessment  Discussed Pulmonary Rehab and offered to assist with referral placement  Advised patient to engage in light exercise as tolerated 3-5 days a week-  patient easily gets shob when ambulating.  The patient is very limited in ability to do activity. Spoke to pcp about this at last appointment last week. The patient verbalized he feels this is a part of his COPD progression.   Discussed with patient copd symptoms, the patient verbalized he is doing well physically but mentally he is having a hard time. His elderly mother was just told she has cancer and had a PET scan to determine if the cancer has spread. 11-15-2019: the patient is in the office today post discharge for COPD exacerbation. The patient verbalized he is felling better physically.  States his "wife" died three weeks ago from "the same thing" (COPD).  The patient is upset about her passing but is dealing with it okay. The patient is finding it harder to take care of himself at home. States his sister is doing a great job but will discuss with [name] today her thoughts on Hospice coming in and working with him. Message sent to [name] with [facility]. [name] has reached out to the patient and he would like to talk to [name] before agreeing to services.   Empathetic listening and allowed the patient to reflect on his health and recent loss of his wife. He does enjoy fishing and talked about how this gives him pleasure when he can go out with his uncle and friends. He doesn't do a lot of the reeling but he likes to bait the hooks for everyone. He wants to continue  to do things he enjoys.   Patient reports his new medicare benefit has started, he is no longer solely Medicaid.   Supplied the patient with Vanilla ensure and coupons at today's visit - completed   Evaluation of the patients current condition: Incoming call from the patient's sister [name].  [name] expressed that she is concerned about the patient as his oxygen levels are in the 70's and they come up some but not a lot. The patient is in bed and not getting up. She feels the patient has pneumonia. The patient  does not want to go to the hospital because he fears they will "intubate" him. Education on the need to be evaluated at the hospital and that he could express his desire not to be intubated. The patients sister is really concerned and wanted to best know how to help the patient. Secure chat messaging with the pcp and CCM team. All agree the patient should be evaluated at the hospital. Message relayed to the patients sister [name]. Talked with the patients sister about tough decisions of what the patients wishes are and discussed the options of pallative/hospice care. The patient's sister is concerned and will try to talk the patient into going to the hospital.  The patient does continue to smoke. Empathetic listening and support given to the patients sister. The patients sister will update the RNCM of any changes. Completed.  The patient is back to his baseline status.   Pharmacy referral for help with cough medicine and cost constraints.   LCSW referral for help with grief over recent news of his mother having cancer. PET scan today. The patient has a strong faith in God and says he has put it in his hands. 11-15-2019: the RNCM will let CCM team LCSW know about the patients wife passing for follow up and assistance as needed.   Review of smoking- the patient continues to smoke and is not interested in smoking cessation at this time.   Assessed the patients needs. The patient ask about the qualifiers for getting a scooter.  Encouraged the patient to talk to the pcp about the scooter. In basket message sent to [facility] for the 7 elements of requirements for scooter/power wheelchair. Will work with pcp for assistance as needed. Gave information on suppliers in [place] for scooters/power wheelchairs. 12-06-2019: The patient called and left a VM for the RNCM. RNCM called the patient back. He has talked with [employer] [employer] today. He says that they have the scooter in stock but they have not received  the corrected paperwork from the pcp office. The patient ask for follow up on the paperwork. Ask for the office to send updated paperwork to [employer] [facility] for POV, not power chair. Will in basket pcp and clinical staff and ask for assistance with faxing paperwork to [employer].   Patient Self Care Activities:  Takes medications as prescribed including inhalers  Practices and uses pursed lip breathing for shortness of breath recovery and prevention  Self assesses COPD action plan zone and makes appointment with provider if in the yellow zone for 48 hours without improvement.  Engages in light exercise 3-5 days a week  Utilizes infection prevention strategies to reduce risk of respiratory infection   Please see past updates related to this goal by clicking on the "Past Updates" button in the selected goal         Patient verbalizes understanding of instructions provided today.   The care management team will  reach out to the patient again over the next 90 days.   Alto Denver RN, MSN, CCM Community Care Coordinator [place]   [facility] Mobile: [phone]

## 2019-12-06 NOTE — Chronic Care Management (AMB) (Signed)
Care Management   Follow Up Note   12/06/2019 Name: Austin Blevins MRN: [id number] DOB: 1961-02-03  Referred by: Austin Skiff, NP Reason for referral : Care Coordination (VM from the patient asking for a call back. Asking for staff to send paperwork to [facility] [facility])   Austin Blevins is a 59 y.o. year old male who is a primary care patient of [name], Austin Rank, NP. The care management team was consulted for assistance with care management and care coordination needs.    Review of patient status, including review of consultants reports, relevant laboratory and other test results, and collaboration with appropriate care team members and the patient's provider was performed as part of comprehensive patient evaluation and provision of chronic care management services.    SDOH (Social Determinants of Health) assessments performed: Yes See Care Plan activities for detailed interventions related to Austin Blevins)     Advanced Directives: See Care Plan and Vynca application for related entries.   Goals Addressed              This Visit's Progress   .  RN- COPD/Depression/Chronic Pancreatitis Management (pt-stated)        Current Barriers:  [name] Kitchen Knowledge deficits related to basic COPD self care/management . Knowledge deficit related to Depression and Chronic pancreatitis . Knowledge deficit related to importance of energy conservation  . Financial Barriers   Case Manager Clinical Goal(s):  Over the next 120 days patient will report using inhalers as prescribed including rinsing mouth after use  Over the next 120 days patient will report utilizing pursed lip breathing for shortness of breath  Over the next 120 days, patient will be able to verbalize understanding of COPD action plan and when to seek appropriate levels of medical care  Over the next 120 days, patient will engage in lite exercise as tolerated to build/regain stamina and strength and reduce shortness of breath through  activity tolerance  Over the next 120 days, patient will verbalize basic understanding of COPD disease process and self care activities  Over the next 90 days the patient will work with pulmonary provider to get a new nebulizer machine as the one he is using is one he got at a yard sale- completed  Over the next 90 days the patient with work with the oxygen company to get needed oxygen tanks. The patient now has an Inogen machine and was told that [employer] would not be supplier anymore. The patient is trying to confirm the information he needs so he will not be without his oxygen.  Over the next 120 days, the patient will work with the [facility] for effective management of depression and chronic pancreatitis   Interventions:   Provided patient with COPD action plan and reinforced importance of daily self assessment  Discussed Pulmonary Rehab and offered to assist with referral placement  Advised patient to engage in light exercise as tolerated 3-5 days a week- patient easily gets shob when ambulating.  The patient is very limited in ability to do activity. Spoke to pcp about this at last appointment last week. The patient verbalized he feels this is a part of his COPD progression.   Discussed with patient copd symptoms, the patient verbalized he is doing well physically but mentally he is having a hard time. His elderly mother was just told she has cancer and had a PET scan to determine if the cancer has spread. 11-15-2019: the patient is in the office today post discharge  for COPD exacerbation. The patient verbalized he is felling better physically.  States his "wife" died three weeks ago from "the same thing" (COPD).  The patient is upset about her passing but is dealing with it okay. The patient is finding it harder to take care of himself at home. States his sister is doing a great job but will discuss with Austin Blevins today her thoughts on Hospice coming in and working with him. Message  sent to Austin Blevins with Austin Blevins and Hospice. [name] has reached out to the patient and he would like to talk to Austin Blevins before agreeing to services.   Empathetic listening and allowed the patient to reflect on his health and recent loss of his wife. He does enjoy fishing and talked about how this gives him pleasure when he can go out with his uncle and friends. He doesn't do a lot of the reeling but he likes to bait the hooks for everyone. He wants to continue to do things he enjoys.   Patient reports his new medicare benefit has started, he is no longer solely Medicaid.   Supplied the patient with Vanilla ensure and coupons at today's visit - completed   Evaluation of the patients current condition: Incoming call from the patient's sister Austin Blevins.  Austin Blevins expressed that she is concerned about the patient as his oxygen levels are in the 70's and they come up some but not a lot. The patient is in bed and not getting up. She feels the patient has pneumonia. The patient does not want to go to the hospital because he fears they will "intubate" him. Education on the need to be evaluated at the hospital and that he could express his desire not to be intubated. The patients sister is really concerned and wanted to best know how to help the patient. Secure chat messaging with the pcp and CCM team. All agree the patient should be evaluated at the hospital. Message relayed to the patients sister Austin Blevins. Talked with the patients sister about tough decisions of what the patients wishes are and discussed the options of pallative/hospice care. The patient's sister is concerned and will try to talk the patient into going to the hospital.  The patient does continue to smoke. Empathetic listening and support given to the patients sister. The patients sister will update the RNCM of any changes. Completed.  The patient is back to his baseline status.   Pharmacy referral for help with cough medicine and cost  constraints.   LCSW referral for help with grief over recent news of his mother having cancer. PET scan today. The patient has a strong faith in God and says he has put it in his hands. 11-15-2019: the RNCM will let CCM team LCSW know about the patients wife passing for follow up and assistance as needed.   Review of smoking- the patient continues to smoke and is not interested in smoking cessation at this time.   Assessed the patients needs. The patient ask about the qualifiers for getting a scooter.  Encouraged the patient to talk to the pcp about the scooter. In basket message sent to [facility] for the 7 elements of requirements for scooter/power wheelchair. Will work with pcp for assistance as needed. Gave information on suppliers in [place] for scooters/power wheelchairs. 12-06-2019: The patient called and left a VM for the RNCM. RNCM called the patient back. He has talked with [facility] today. He says that they have the scooter in stock but  they have not received the corrected paperwork from the pcp office. The patient ask for follow up on the paperwork. Ask for the office to send updated paperwork to [facility] [facility] for POV, not power chair. Will in basket pcp and clinical staff and ask for assistance with faxing paperwork to [facility] [facility] [employer].   Patient Self Care Activities:  Takes medications as prescribed including inhalers  Practices and uses pursed lip breathing for shortness of breath recovery and prevention  Self assesses COPD action plan zone and makes appointment with provider if in the yellow zone for 48 hours without improvement.  Engages in light exercise 3-5 days a week  Utilizes infection prevention strategies to reduce risk of respiratory infection   Please see past updates related to this goal by clicking on the "Past Updates" button in the selected goal          The care management team will reach out to the patient again over the next 90  days.   Alto Denver RN, MSN, CCM Community Care Coordinator [place]  [facility] Mobile: [phone]

## 2019-12-06 NOTE — Telephone Encounter (Signed)
Pt wants to know if the order for scooter has been sent to [employer]? He states the order needed to be re worded so medicare will cover the 20%.

## 2019-12-06 NOTE — Chronic Care Management (AMB) (Signed)
  Care Management   Follow Up Note   12/06/2019 Name: DAKIN MADANI MRN: [id number] DOB: December 14, 1960  Referred by: [name], NP Reason for referral : Care Coordination   ADD DINAPOLI is a 59 y.o. year old male who is a primary care patient of [name], [name], NP. The care management team was consulted for assistance with care management and care coordination needs.    Review of patient status, including review of consultants reports, relevant laboratory and other test results, and collaboration with appropriate care team members and the patient's provider was performed as part of comprehensive patient evaluation and provision of chronic care management services.    LCSW received update from CCM RNCM that patient has now transitioned to Hospice. LCSW will complete case closure at this time per CCM policy.   Dickie La, BSW, MSW, LCSW [facility] Care Management [place]  [facility] [email or web address] Phone: [phone]

## 2019-12-07 NOTE — Telephone Encounter (Signed)
Addended note and paperwork refaxed to [facility] with corrections made.

## 2019-12-07 NOTE — Telephone Encounter (Signed)
I have edited note to state power wheelchair in all locations and added addendum note.

## 2019-12-13 ENCOUNTER — Telehealth: Payer: Self-pay | Admitting: Nurse Practitioner

## 2019-12-13 ENCOUNTER — Telehealth: Payer: Self-pay

## 2019-12-13 NOTE — Telephone Encounter (Signed)
Should probably move appt sooner to get these things addressed

## 2019-12-13 NOTE — Telephone Encounter (Signed)
Please advise.   Copied from CRM [id number]. Topic: Quick Communication - Rx Refill/Question >> Dec 13, 2019  3:41 PM Randol Kern wrote: Pt needs a power chair and pain medication. Pt is ending his hospice care on Monday.  Best contact: [phone]

## 2019-12-14 NOTE — Telephone Encounter (Signed)
Pt stated he has received his pain medication and would like to keep his apt with PCP, Pt was offered a sooner apt with other providers Pt declined.Pt verbalized understanding.

## 2019-12-15 NOTE — Telephone Encounter (Signed)
Can you check on his wheelchair papers?  I know we fixed everything.  As for pain management he is to be seeing pain management for ongoing follow-up on this.  I recommend he continue care with them if he is not going to continue hospice.  Thanks.:)

## 2019-12-18 ENCOUNTER — Ambulatory Visit: Payer: Self-pay | Admitting: General Practice

## 2019-12-18 DIAGNOSIS — F419 Anxiety disorder, unspecified: Secondary | ICD-10-CM

## 2019-12-18 DIAGNOSIS — F331 Major depressive disorder, recurrent, moderate: Secondary | ICD-10-CM

## 2019-12-18 DIAGNOSIS — J449 Chronic obstructive pulmonary disease, unspecified: Secondary | ICD-10-CM

## 2019-12-18 NOTE — Patient Instructions (Signed)
Visit Information  Goals Addressed              This Visit's Progress   .  RN- COPD/Depression/Chronic Pancreatitis Management (pt-stated)        Current Barriers:  [name] Kitchen Knowledge deficits related to basic COPD self care/management . Knowledge deficit related to Depression and Chronic pancreatitis . Knowledge deficit related to importance of energy conservation  . Financial Barriers   Case Manager Clinical Goal(s):  Over the next 120 days patient will report using inhalers as prescribed including rinsing mouth after use  Over the next 120 days patient will report utilizing pursed lip breathing for shortness of breath  Over the next 120 days, patient will be able to verbalize understanding of COPD action plan and when to seek appropriate levels of medical care  Over the next 120 days, patient will engage in lite exercise as tolerated to build/regain stamina and strength and reduce shortness of breath through activity tolerance  Over the next 120 days, patient will verbalize basic understanding of COPD disease process and self care activities  Over the next 90 days the patient will work with pulmonary provider to get a new nebulizer machine as the one he is using is one he got at a yard sale- completed  Over the next 90 days the patient with work with the oxygen company to get needed oxygen tanks. The patient now has an Inogen machine and was told that [employer], would not be supplier anymore. The patient is trying to confirm the information he needs so he will not be without his oxygen.  Over the next 120 days, the patient will work with the [facility] for effective management of depression and chronic pancreatitis   Interventions:   Provided patient with COPD action plan and reinforced importance of daily self assessment  Discussed Pulmonary Rehab and offered to assist with referral placement  Advised patient to engage in light exercise as tolerated 3-5 days a week-  patient easily gets shob when ambulating.  The patient is very limited in ability to do activity. Spoke to pcp about this at last appointment last week. The patient verbalized he feels this is a part of his COPD progression.   Discussed with patient copd symptoms, the patient verbalized he is doing well physically but mentally he is having a hard time. His elderly mother was just told she has cancer and had a PET scan to determine if the cancer has spread. 11-15-2019: the patient is in the office today post discharge for COPD exacerbation. The patient verbalized he is felling better physically.  States his "wife" died three weeks ago from "the same thing" (COPD).  The patient is upset about her passing but is dealing with it okay. The patient is finding it harder to take care of himself at home. States his sister is doing a great job but will discuss with [name] today her thoughts on Hospice coming in and working with him. Message sent to [name] with [facility]. [name] has reached out to the patient and he would like to talk to [facility] before agreeing to services.   Empathetic listening and allowed the patient to reflect on his health and recent loss of his wife. He does enjoy fishing and talked about how this gives him pleasure when he can go out with his uncle and friends. He doesn't do a lot of the reeling but he likes to bait the hooks for everyone. He wants to continue  to do things he enjoys.   Patient reports his new medicare benefit has started, he is no longer solely Medicaid.   Supplied the patient with Vanilla ensure and coupons at today's visit - completed   Evaluation of the patients current condition: Incoming call from the patient's sister [name].  [name] expressed that she is concerned about the patient as his oxygen levels are in the 70's and they come up some but not a lot. The patient is in bed and not getting up. She feels the patient has pneumonia. The patient  does not want to go to the hospital because he fears they will "intubate" him. Education on the need to be evaluated at the hospital and that he could express his desire not to be intubated. The patients sister is really concerned and wanted to best know how to help the patient. Secure chat messaging with the pcp and CCM team. All agree the patient should be evaluated at the hospital. Message relayed to the patients sister [name]. Talked with the patients sister about tough decisions of what the patients wishes are and discussed the options of pallative/hospice care. The patient's sister is concerned and will try to talk the patient into going to the hospital.  The patient does continue to smoke. Empathetic listening and support given to the patients sister. The patients sister will update the RNCM of any changes. Completed.  The patient is back to his baseline status.   Pharmacy referral for help with cough medicine and cost constraints.   LCSW referral for help with grief over recent news of his mother having cancer. PET scan today. The patient has a strong faith in God and says he has put it in his hands. 11-15-2019: the RNCM will let CCM team LCSW know about the patients wife passing for follow up and assistance as needed.   Review of smoking- the patient continues to smoke and is not interested in smoking cessation at this time.   Assessed the patients needs. The patient ask about the qualifiers for getting a scooter.  Encouraged the patient to talk to the pcp about the scooter. In basket message sent to [facility] for the 7 elements of requirements for scooter/power wheelchair. Will work with pcp for assistance as needed. Gave information on suppliers in [place] for scooters/power wheelchairs. 12-06-2019: The patient called and left a VM for the RNCM. RNCM called the patient back. He has talked with [employer] today. He says that they have the scooter in stock but they have not received  the corrected paperwork from the pcp office. The patient ask for follow up on the paperwork. Ask for the office to send updated paperwork to [employer] for POV, not power chair. Will in basket pcp and clinical staff and ask for assistance with faxing paperwork to [employer]. 12-18-2019: The patient had called on Friday and left a VM asking the CM to call back because he had some questions and concerns. The patient called back today. The patient states the people at the supply company says he can not get the scooter if he is with hospice. The patient is upset and says he needs the scooter and is thinking of "quitting hospice".  The patient wanted to know what the best thing to do was. RNCM advised the patient that the [facility] could not tell him what to do and he needed to talk to hospice and the supply company. The patient was not feeling the best today  so he said he would contact them and let the RNCM know what he decided. Emotional support provided.   Patient Self Care Activities:  Takes medications as prescribed including inhalers  Practices and uses pursed lip breathing for shortness of breath recovery and prevention  Self assesses COPD action plan zone and makes appointment with provider if in the yellow zone for 48 hours without improvement.  Engages in light exercise 3-5 days a week  Utilizes infection prevention strategies to reduce risk of respiratory infection   Please see past updates related to this goal by clicking on the "Past Updates" button in the selected goal         Patient verbalizes understanding of instructions provided today.   The care management team will reach out to the patient again over the next 60 days.   Alto Denver RN, MSN, CCM Community Care Coordinator   [facility] Mobile: [phone]

## 2019-12-18 NOTE — Chronic Care Management (AMB) (Signed)
Care Management   Follow Up Note   12/18/2019 Name: Austin Blevins MRN: [id number] DOB: 03-Jan-1961  Referred by: [name], NP Reason for referral : Care Coordination (The patient had left a VM and ask the RNCM to please call him back)   Austin Blevins is a 59 y.o. year old male who is a primary care patient of [name], [name], NP. The care management team was consulted for assistance with care management and care coordination needs.    Review of patient status, including review of consultants reports, relevant laboratory and other test results, and collaboration with appropriate care team members and the patient's provider was performed as part of comprehensive patient evaluation and provision of chronic care management services.    SDOH (Social Determinants of Health) assessments performed: Yes See Care Plan activities for detailed interventions related to [facility])     Advanced Directives: See Care Plan and Vynca application for related entries.   Goals Addressed              This Visit's Progress   .  RN- COPD/Depression/Chronic Pancreatitis Management (pt-stated)        Current Barriers:  [name] Kitchen Knowledge deficits related to basic COPD self care/management . Knowledge deficit related to Depression and Chronic pancreatitis . Knowledge deficit related to importance of energy conservation  . Financial Barriers   Case Manager Clinical Goal(s):  Over the next 120 days patient will report using inhalers as prescribed including rinsing mouth after use  Over the next 120 days patient will report utilizing pursed lip breathing for shortness of breath  Over the next 120 days, patient will be able to verbalize understanding of COPD action plan and when to seek appropriate levels of medical care  Over the next 120 days, patient will engage in lite exercise as tolerated to build/regain stamina and strength and reduce shortness of breath through activity tolerance  Over the next 120  days, patient will verbalize basic understanding of COPD disease process and self care activities  Over the next 90 days the patient will work with pulmonary provider to get a new nebulizer machine as the one he is using is one he got at a yard sale- completed  Over the next 90 days the patient with work with the oxygen company to get needed oxygen tanks. The patient now has an Inogen machine and was told that [employer] would not be supplier anymore. The patient is trying to confirm the information he needs so he will not be without his oxygen.  Over the next 120 days, the patient will work with the [facility] for effective management of depression and chronic pancreatitis   Interventions:   Provided patient with COPD action plan and reinforced importance of daily self assessment  Discussed Pulmonary Rehab and offered to assist with referral placement  Advised patient to engage in light exercise as tolerated 3-5 days a week- patient easily gets shob when ambulating.  The patient is very limited in ability to do activity. Spoke to pcp about this at last appointment last week. The patient verbalized he feels this is a part of his COPD progression.   Discussed with patient copd symptoms, the patient verbalized he is doing well physically but mentally he is having a hard time. His elderly mother was just told she has cancer and had a PET scan to determine if the cancer has spread. 11-15-2019: the patient is in the office today post discharge for COPD exacerbation. The  patient verbalized he is felling better physically.  States his "wife" died three weeks ago from "the same thing" (COPD).  The patient is upset about her passing but is dealing with it okay. The patient is finding it harder to take care of himself at home. States his sister is doing a great job but will discuss with [name] today her thoughts on Hospice coming in and working with him. Message sent to [name] with [facility] and Hospice. [name] has reached out to the patient and he would like to talk to [name] before agreeing to services.   Empathetic listening and allowed the patient to reflect on his health and recent loss of his wife. He does enjoy fishing and talked about how this gives him pleasure when he can go out with his uncle and friends. He doesn't do a lot of the reeling but he likes to bait the hooks for everyone. He wants to continue to do things he enjoys.   Patient reports his new medicare benefit has started, he is no longer solely Medicaid.   Supplied the patient with Vanilla ensure and coupons at today's visit - completed   Evaluation of the patients current condition: Incoming call from the patient's sister [name].  [name] expressed that she is concerned about the patient as his oxygen levels are in the 70's and they come up some but not a lot. The patient is in bed and not getting up. She feels the patient has pneumonia. The patient does not want to go to the hospital because he fears they will "intubate" him. Education on the need to be evaluated at the hospital and that he could express his desire not to be intubated. The patients sister is really concerned and wanted to best know how to help the patient. Secure chat messaging with the pcp and CCM team. All agree the patient should be evaluated at the hospital. Message relayed to the patients sister [name]. Talked with the patients sister about tough decisions of what the patients wishes are and discussed the options of pallative/hospice care. The patient's sister is concerned and will try to talk the patient into going to the hospital.  The patient does continue to smoke. Empathetic listening and support given to the patients sister. The patients sister will update the RNCM of any changes. Completed.  The patient is back to his baseline status.   Pharmacy referral for help with cough medicine and cost constraints.   LCSW referral for help with  grief over recent news of his mother having cancer. PET scan today. The patient has a strong faith in God and says he has put it in his hands. 11-15-2019: the RNCM will let CCM team LCSW know about the patients wife passing for follow up and assistance as needed.   Review of smoking- the patient continues to smoke and is not interested in smoking cessation at this time.   Assessed the patients needs. The patient ask about the qualifiers for getting a scooter.  Encouraged the patient to talk to the pcp about the scooter. In basket message sent to [facility] for the 7 elements of requirements for scooter/power wheelchair. Will work with pcp for assistance as needed. Gave information on suppliers in [place] for scooters/power wheelchairs. 12-06-2019: The patient called and left a VM for the RNCM. RNCM called the patient back. He has talked with [employer] today. He says that they have the scooter in stock but they have not received  the corrected paperwork from the pcp office. The patient ask for follow up on the paperwork. Ask for the office to send updated paperwork to [employer] for POV, not power chair. Will in basket pcp and clinical staff and ask for assistance with faxing paperwork to [employer]. 12-18-2019: The patient had called on Friday and left a VM asking the CM to call back because he had some questions and concerns. The patient called back today. The patient states the people at the supply company says he can not get the scooter if he is with hospice. The patient is upset and says he needs the scooter and is thinking of "quitting hospice".  The patient wanted to know what the best thing to do was. RNCM advised the patient that the [facility] could not tell him what to do and he needed to talk to hospice and the supply company. The patient was not feeling the best today so he said he would contact them and let the RNCM know what he decided. Emotional support provided.    Patient Self Care Activities:  Takes medications as prescribed including inhalers  Practices and uses pursed lip breathing for shortness of breath recovery and prevention  Self assesses COPD action plan zone and makes appointment with provider if in the yellow zone for 48 hours without improvement.  Engages in light exercise 3-5 days a week  Utilizes infection prevention strategies to reduce risk of respiratory infection   Please see past updates related to this goal by clicking on the "Past Updates" button in the selected goal          The care management team will reach out to the patient again over the next 60 days.   Alto Denver RN, MSN, CCM Community Care Coordinator [place]  [facility] Mobile: [phone]

## 2019-12-19 ENCOUNTER — Telehealth: Payer: Self-pay | Admitting: Nurse Practitioner

## 2019-12-19 NOTE — Telephone Encounter (Signed)
Routing to provider  

## 2019-12-19 NOTE — Telephone Encounter (Signed)
Copied from CRM [id number]. Topic: General - Inquiry >> Dec 18, 2019  1:24 PM Leary Roca wrote: Reason for CRM:Pts sister (POA) is needing a call back to speak with Jolene about pt wanting to stop home care. Please call pts sister back

## 2019-12-20 NOTE — Telephone Encounter (Signed)
Tried calling Erie Noe with [employer] to discuss patient's power chair, and was informed that she will not be in until 1 pm today. Will try to call back this afternoon.

## 2019-12-20 NOTE — Telephone Encounter (Signed)
Called and spoke to patient's sister [name]. She states that the patient is addiment about having a power chair but he cannot get one since he is on/under Hospice care. [name] states that they patient is wanting to come off of Hospice care long enough to get his power chair and then go back on Hospice care. [name] states that she does not think that this is a good idea because they will have to start the whole process over again with Hospice. She states she just wants Jolene's opinion when she returns tomorrow as to what she feels is best for them to do.

## 2019-12-20 NOTE — Telephone Encounter (Signed)
Will reach out to them tomorrow.

## 2019-12-20 NOTE — Telephone Encounter (Signed)
Can you reach out to them today and just let them know I am not back in office until tomorrow.  See reason for wishing to stop hospice care.  Also alert them that they will need to continue appointments then with pain management, recommend scheduling these, as will need this for chronic pain management ongoing.  Plus recommend he keep appointment with pulmonary provider as well.  Thank you.:)

## 2019-12-21 ENCOUNTER — Other Ambulatory Visit: Payer: Self-pay | Admitting: Nurse Practitioner

## 2019-12-21 NOTE — Telephone Encounter (Signed)
That is great and I feel he really needs to stay with it. I told him the other day I could not tell him what to do but thought he should reconsider. That's why I reached out to my contact at hospice.

## 2019-12-21 NOTE — Telephone Encounter (Signed)
Thank you Pam.  His sister does not wish him to discontinue hospice services and I agree with her.  Our hope is they can provide him with some type of chair that may enable him to go fishing.  They truly have loved hospice services.  His sister reports patient is much happier.

## 2019-12-21 NOTE — Telephone Encounter (Signed)
See other encounter.

## 2019-12-21 NOTE — Telephone Encounter (Signed)
Spoke to patient's sister, [name], on telephone.  Agree with her that would be best not to take patient off hospice.  She reports this has offered a major benefit to patient, both in regard to quality of life and symptom management.  Concern with coming off hospice to obtain power wheelchair and then returning to hospice.  Brooke and [employer], is there a way you can assist with this matter.  Reach out to hospice? Is there a way to obtain any kind of chair, even a manual W/C so he can go fishing, while on hospice?

## 2019-12-21 NOTE — Telephone Encounter (Signed)
Austin Blevins, I talked to Darragh about this on the phone the other day and then I reached out to [facility] with what his concerns where. She gave me the information to the SW there and she was contacting the SW to get him to reach out to [place] and his sister. I also texted the information to [place] with the SW information for Hospice. [name] had talked to the nurse and she said the same thing you did concerning the sister and the patient feeling that hospice services trumped power scooter.  Thanks, Pam

## 2019-12-26 ENCOUNTER — Ambulatory Visit: Payer: Medicare Other | Admitting: Nurse Practitioner

## 2019-12-27 ENCOUNTER — Telehealth: Payer: Self-pay

## 2020-02-21 ENCOUNTER — Other Ambulatory Visit: Payer: Self-pay | Admitting: Nurse Practitioner

## 2020-02-22 ENCOUNTER — Other Ambulatory Visit: Payer: Self-pay | Admitting: Nurse Practitioner

## 2020-02-22 DIAGNOSIS — J449 Chronic obstructive pulmonary disease, unspecified: Secondary | ICD-10-CM

## 2020-03-16 ENCOUNTER — Other Ambulatory Visit: Payer: Self-pay | Admitting: Nurse Practitioner

## 2020-03-16 NOTE — Telephone Encounter (Signed)
Requested Prescriptions  Pending Prescriptions Disp Refills  . montelukast (SINGULAIR) 10 MG tablet [Pharmacy Med Name: MONTELUKAST SODIUM 10 MG TAB] 90 tablet 1    Sig: TAKE ONE TABLET AT BEDTIME     Pulmonology:  Leukotriene Inhibitors Passed - 03/16/2020 10:50 AM      Passed - Valid encounter within last 12 months    Recent Outpatient Visits          4 months ago Stage 3 severe COPD by GOLD classification (HCC)   [facility] [place], [name] T, NP   4 months ago Grief   [facility] Valentino Nose, NP   6 months ago Stage 3 severe COPD by GOLD classification (HCC)   [facility] [place], Jolene T, NP   7 months ago COPD with acute exacerbation (HCC)   [facility] [place], Jolene T, NP   8 months ago Depression, major, recurrent, moderate (HCC)   [facility] [name], Jolene T, NP             . omeprazole (PRILOSEC) 20 MG capsule [Pharmacy Med Name: OMEPRAZOLE DR 20 MG CAP] 90 capsule 1    Sig: TAKE 1 CAPSULE EVERY DAY     Gastroenterology: Proton Pump Inhibitors Passed - 03/16/2020 10:50 AM      Passed - Valid encounter within last 12 months    Recent Outpatient Visits          4 months ago Stage 3 severe COPD by GOLD classification (HCC)   [facility] [place], [name] T, NP   4 months ago Grief   [facility] Valentino Nose, NP   6 months ago Stage 3 severe COPD by GOLD classification (HCC)   [facility] [place], Jolene T, NP   7 months ago COPD with acute exacerbation (HCC)   [facility] [place], Jolene T, NP   8 months ago Depression, major, recurrent, moderate (HCC)   [facility] [name], Jolene T, NP             . rOPINIRole (REQUIP) 0.5 MG tablet [Pharmacy Med Name: ROPINIROLE HCL 0.5 MG TAB] 120 tablet 2    Sig: TAKE ONE TABLET EVERY MORNING AND TAKE THREE TABLETS AT BEDTIME AS NEEDED FOR RESTLESS LEG SYNDROME      Neurology:  Parkinsonian Agents Passed - 03/16/2020 10:50 AM      Passed - Last BP in normal range    BP Readings from Last 1 Encounters:  11/16/19 121/89         Passed - Valid encounter within last 12 months    Recent Outpatient Visits          4 months ago Stage 3 severe COPD by GOLD classification (HCC)   [facility] [place], [name] T, NP   4 months ago Grief   [facility] Valentino Nose, NP   6 months ago Stage 3 severe COPD by GOLD classification (HCC)   [facility] [place], Jolene T, NP   7 months ago COPD with acute exacerbation (HCC)   [facility] [place], Jolene T, NP   8 months ago Depression, major, recurrent, moderate (HCC)   [facility] [place], [name], NP

## 2020-04-17 DEATH — deceased

## 2020-04-18 ENCOUNTER — Other Ambulatory Visit: Payer: Self-pay | Admitting: Nurse Practitioner

## 2020-07-27 IMAGING — CT CT ANGIO CHEST
2 of 6 series · 18 of 46 positions shown · IV contrast (APPLIED)
Comparison: Chest x-ray from earlier in the same day.

CLINICAL DATA: Respiratory distress and hypoxia

EXAM:
CT ANGIOGRAPHY CHEST WITH CONTRAST
TECHNIQUE: Multidetector CT imaging of the chest was performed using the
standard protocol during bolus administration of intravenous
contrast. Multiplanar CT image reconstructions and MIPs were
obtained to evaluate the vascular anatomy.
CONTRAST:  75mL OMNIPAQUE IOHEXOL 350 MG/ML SOLN

[Series 5: thins · axial · 0.66mm/px · z∈[-339,-57]mm · 15 of 310 slices shown]
[im 14/310  lung]
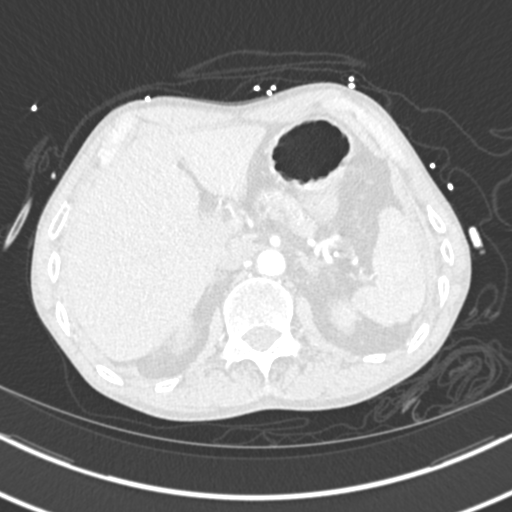
[im 41/310  soft-tissue]
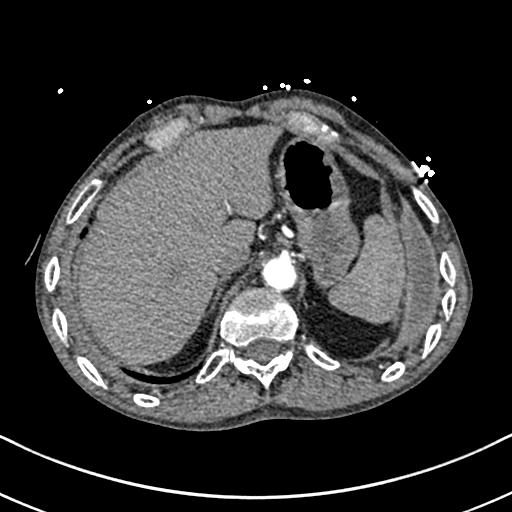
[im 54/310  lung]
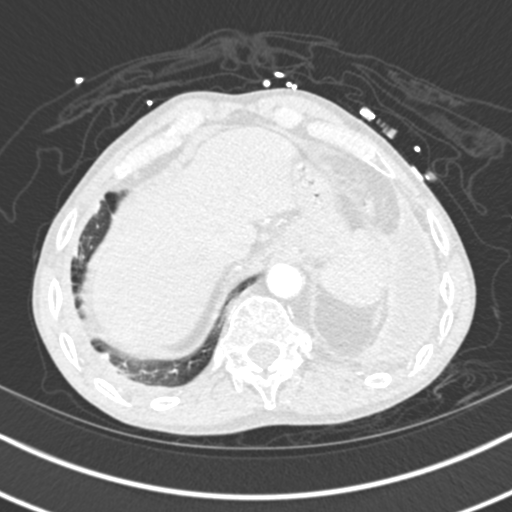
[im 81/310  soft-tissue]
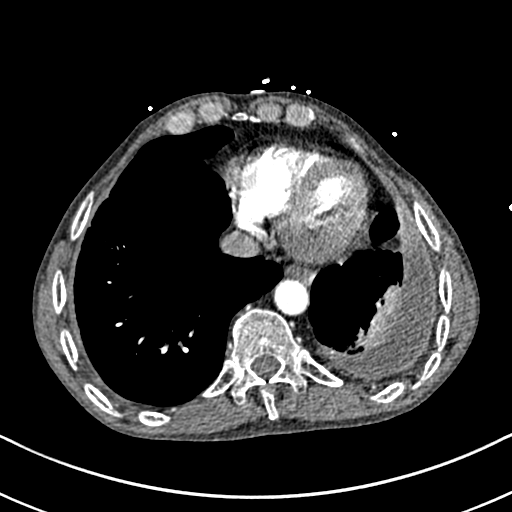
[im 95/310  lung]
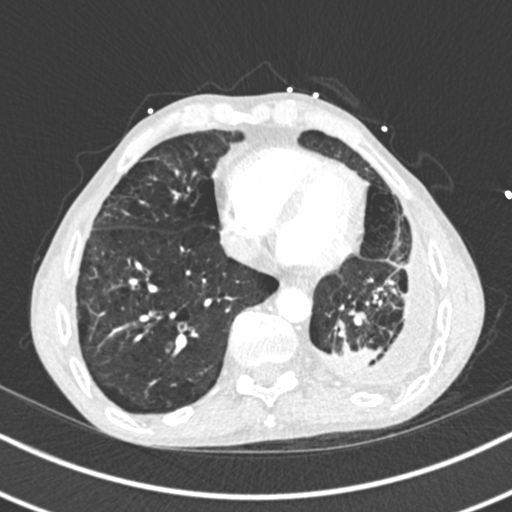
[im 121/310  soft-tissue]
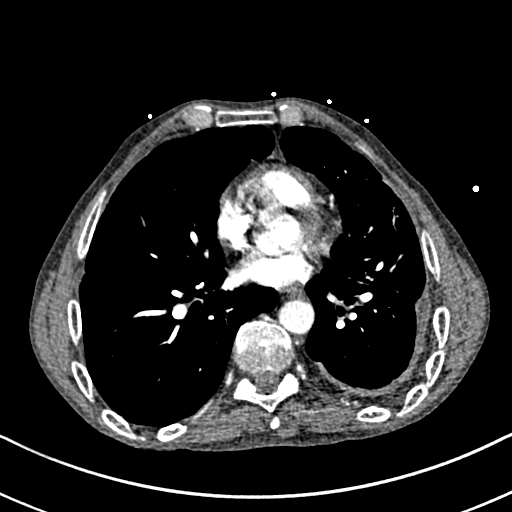
[im 135/310  lung]
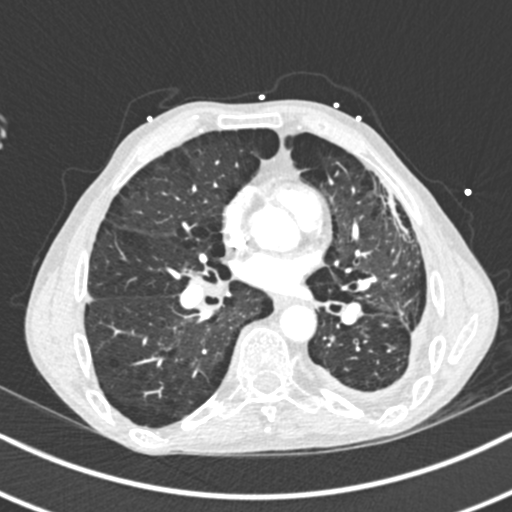
[im 162/310  soft-tissue]
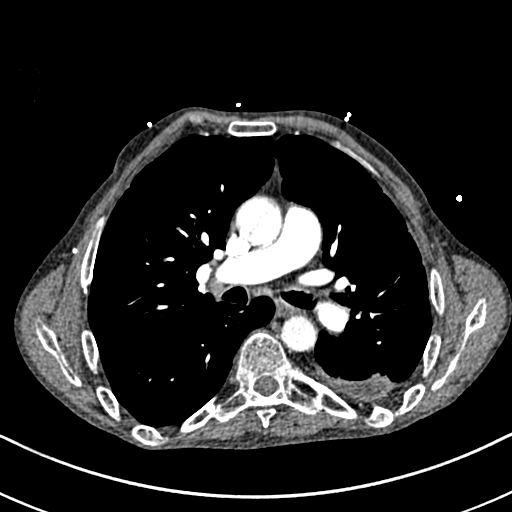
[im 175/310  lung]
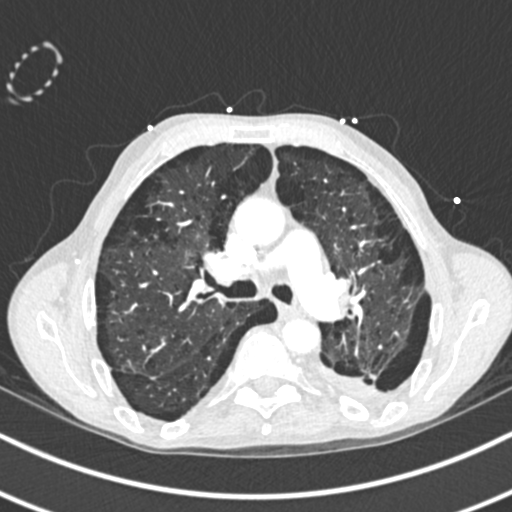
[im 189/310  soft-tissue]
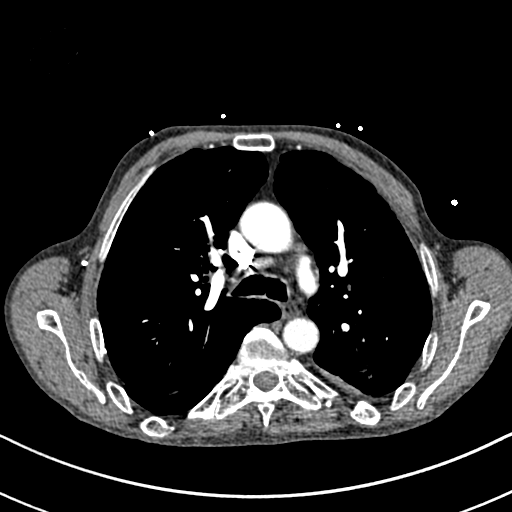
[im 215/310  lung]
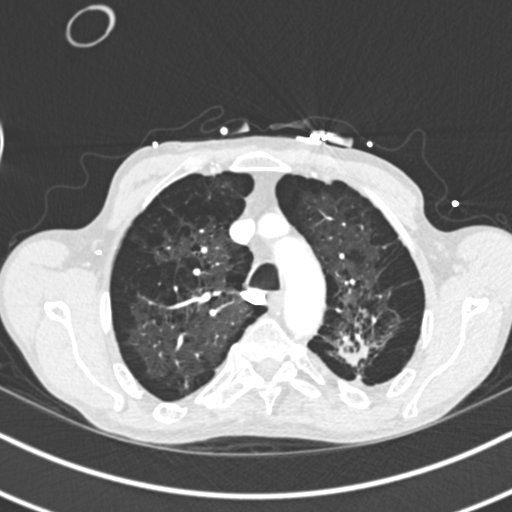
[im 229/310  soft-tissue]
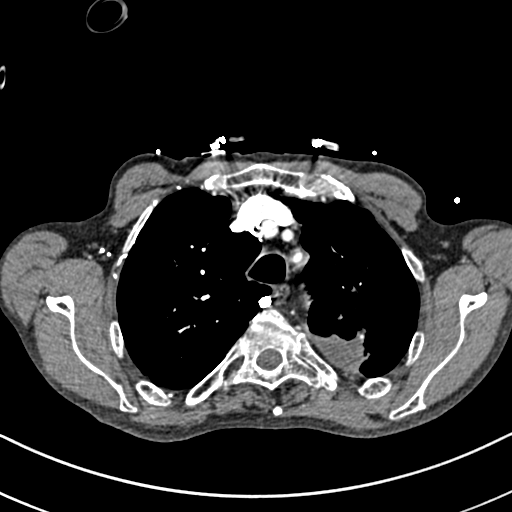
[im 256/310  lung]
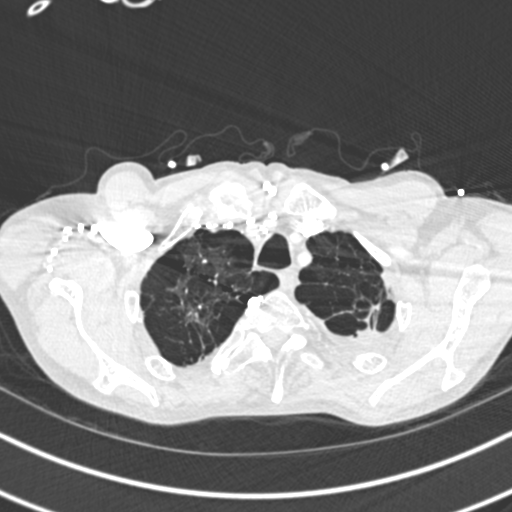
[im 269/310  soft-tissue]
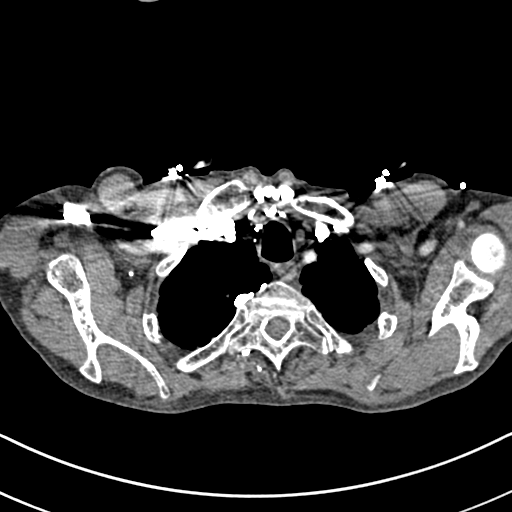
[im 296/310  lung]
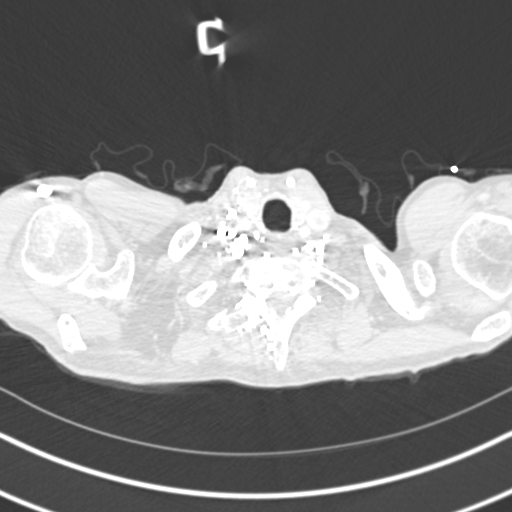

[Series 7: coronal mpr · coronal · 0.65mm/px · 3 of 82 slices shown]
[im 21/82  soft-tissue]
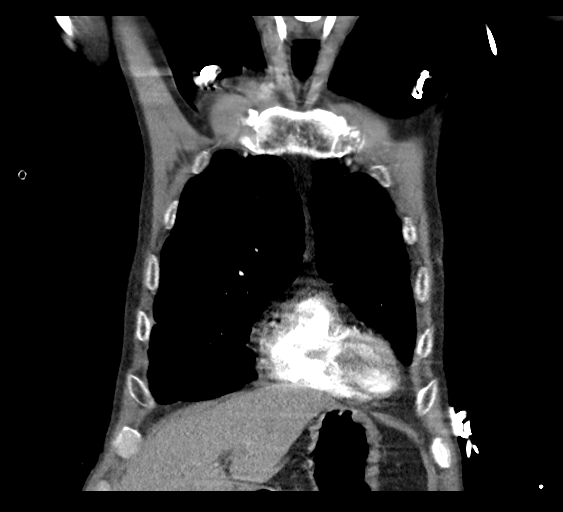
[im 41/82  soft-tissue]
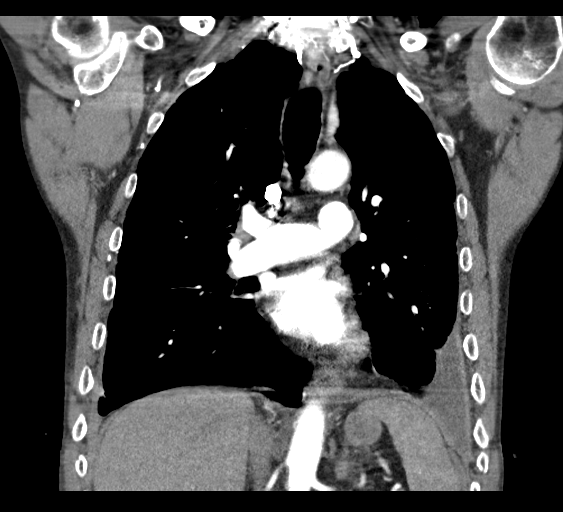
[im 61/82  soft-tissue]
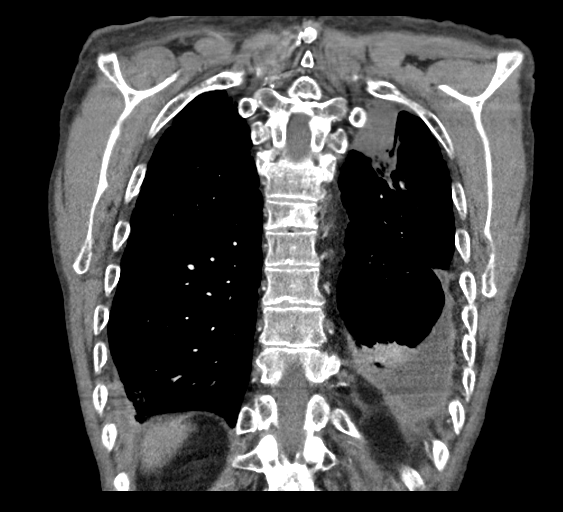

[18 of 46 positions shown; findings below may reference images not displayed]

FINDINGS: Cardiovascular: Thoracic aorta demonstrates atherosclerotic
calcifications similar to that seen on prior examination. No
aneurysmal dilatation or dissection is noted. Pulmonary artery is
well visualized with a normal branching pattern. No filling defects
are identified to suggest pulmonary emboli. Right innominate vein is
significantly narrowed with multiple collaterals in the chest wall
and mediastinum identified.

Mediastinum/Nodes: Thoracic inlet is within normal limits. No hilar
or mediastinal adenopathy is noted. The esophagus is within normal
limits.

Lungs/Pleura: Lungs again demonstrate diffuse emphysematous changes.
Stable superior segment right lower lobe nodule is seen. Stable
right upper lobe nodule is noted on image number 30 of series 6.
Nodular density in the left upper lobe is also stable on image
number 27 of series 6. There is however a new pleural based density
identified on the left which measures approximately 4.2 x 2.0 cm.
This corresponds to the changes seen on recent plain film
examination and given its acute nature is most consistent with focal
pneumonia and likely some associated loculated fluid. Persistent
changes in the left base are noted with loculated pleural fluid
similar to that seen on the prior exam. Some associated atelectatic
changes are noted.

Upper Abdomen: Chronic pancreatitis changes are seen. No other focal
abnormality is noted.

Musculoskeletal: Degenerative changes of the thoracic spine are
noted. No acute rib abnormality is seen.

Review of the MIP images confirms the above findings.
IMPRESSION: No evidence of pulmonary emboli.

New pleural based density in the medial aspect of the left upper
lobe posteriorly. This corresponds to the chest x-ray findings and
given its acute nature likely represents focal pneumonia. Short-term
follow-up is recommended to assess for resolution following
appropriate therapy.

Scattered bilateral parenchymal nodules within the lungs stable from
the prior exams.

Stable severe changes of COPD.

Stable loculated fluid collection in the left base

## 2020-10-23 ENCOUNTER — Ambulatory Visit: Payer: Medicaid Other | Admitting: Nurse Practitioner
